# Patient Record
Sex: Female | Born: 1995 | Race: White | Hispanic: No | Smoking: Current every day smoker
Health system: Southern US, Community
[De-identification: ages and names within clinical notes are randomized; demographics above are authoritative.]

## PROBLEM LIST (undated history)

## (undated) DIAGNOSIS — N189 Chronic kidney disease, unspecified: Secondary | ICD-10-CM

## (undated) DIAGNOSIS — M1990 Unspecified osteoarthritis, unspecified site: Secondary | ICD-10-CM

## (undated) DIAGNOSIS — R519 Headache, unspecified: Secondary | ICD-10-CM

## (undated) DIAGNOSIS — F329 Major depressive disorder, single episode, unspecified: Secondary | ICD-10-CM

## (undated) DIAGNOSIS — F32A Depression, unspecified: Secondary | ICD-10-CM

## (undated) DIAGNOSIS — F1910 Other psychoactive substance abuse, uncomplicated: Secondary | ICD-10-CM

## (undated) DIAGNOSIS — G4730 Sleep apnea, unspecified: Secondary | ICD-10-CM

## (undated) DIAGNOSIS — T1491XA Suicide attempt, initial encounter: Secondary | ICD-10-CM

## (undated) DIAGNOSIS — F319 Bipolar disorder, unspecified: Secondary | ICD-10-CM

## (undated) DIAGNOSIS — K219 Gastro-esophageal reflux disease without esophagitis: Secondary | ICD-10-CM

## (undated) DIAGNOSIS — E785 Hyperlipidemia, unspecified: Secondary | ICD-10-CM

## (undated) DIAGNOSIS — F419 Anxiety disorder, unspecified: Secondary | ICD-10-CM

## (undated) DIAGNOSIS — Z87442 Personal history of urinary calculi: Secondary | ICD-10-CM

## (undated) DIAGNOSIS — D649 Anemia, unspecified: Secondary | ICD-10-CM

## (undated) DIAGNOSIS — N200 Calculus of kidney: Secondary | ICD-10-CM

## (undated) DIAGNOSIS — D493 Neoplasm of unspecified behavior of breast: Secondary | ICD-10-CM

## (undated) DIAGNOSIS — J189 Pneumonia, unspecified organism: Secondary | ICD-10-CM

## (undated) DIAGNOSIS — O020 Blighted ovum and nonhydatidiform mole: Secondary | ICD-10-CM

## (undated) DIAGNOSIS — J45909 Unspecified asthma, uncomplicated: Secondary | ICD-10-CM

## (undated) HISTORY — DX: Gastro-esophageal reflux disease without esophagitis: K21.9

## (undated) HISTORY — DX: Other psychoactive substance abuse, uncomplicated: F19.10

## (undated) HISTORY — DX: Unspecified asthma, uncomplicated: J45.909

## (undated) HISTORY — DX: Sleep apnea, unspecified: G47.30

## (undated) HISTORY — DX: Neoplasm of unspecified behavior of breast: D49.3

## (undated) HISTORY — DX: Chronic kidney disease, unspecified: N18.9

## (undated) HISTORY — DX: Major depressive disorder, single episode, unspecified: F32.9

## (undated) HISTORY — PX: TUBAL LIGATION: SHX77

## (undated) HISTORY — DX: Anxiety disorder, unspecified: F41.9

## (undated) HISTORY — PX: LITHOTRIPSY: SUR834

## (undated) HISTORY — DX: Blighted ovum and nonhydatidiform mole: O02.0

## (undated) HISTORY — DX: Suicide attempt, initial encounter: T14.91XA

## (undated) HISTORY — DX: Bipolar disorder, unspecified: F31.9

## (undated) HISTORY — PX: CYSTOSCOPY: SUR368

## (undated) HISTORY — DX: Depression, unspecified: F32.A

## (undated) HISTORY — DX: Calculus of kidney: N20.0

---
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
## 2013-06-05 ENCOUNTER — Encounter (HOSPITAL_COMMUNITY): Payer: Self-pay | Admitting: Pharmacy Technician

## 2013-06-14 ENCOUNTER — Encounter (HOSPITAL_COMMUNITY): Payer: Self-pay

## 2013-06-14 ENCOUNTER — Encounter (HOSPITAL_COMMUNITY)
Admission: RE | Admit: 2013-06-14 | Discharge: 2013-06-14 | Disposition: A | Discharge status: Home / Self Care | Payer: Medicaid Other | Source: Ambulatory Visit | Attending: Urology | Admitting: Urology

## 2013-06-20 ENCOUNTER — Ambulatory Visit (HOSPITAL_COMMUNITY)
Admission: RE | Admit: 2013-06-20 | Discharge: 2013-06-20 | Disposition: A | Discharge status: Home / Self Care | Payer: Medicaid Other | Source: Ambulatory Visit | Attending: Urology | Admitting: Urology

## 2013-06-20 ENCOUNTER — Encounter (HOSPITAL_COMMUNITY)
Admission: RE | Disposition: A | Discharge status: Home / Self Care | Payer: Self-pay | Source: Ambulatory Visit | Attending: Urology

## 2013-06-20 ENCOUNTER — Encounter (HOSPITAL_COMMUNITY): Payer: Self-pay

## 2013-06-20 DIAGNOSIS — Z888 Allergy status to other drugs, medicaments and biological substances status: Secondary | ICD-10-CM | POA: Insufficient documentation

## 2013-06-20 DIAGNOSIS — Z881 Allergy status to other antibiotic agents status: Secondary | ICD-10-CM | POA: Insufficient documentation

## 2013-06-20 DIAGNOSIS — Z880 Allergy status to penicillin: Secondary | ICD-10-CM | POA: Insufficient documentation

## 2013-06-20 DIAGNOSIS — N201 Calculus of ureter: Secondary | ICD-10-CM

## 2013-06-20 HISTORY — PX: EXTRACORPOREAL SHOCK WAVE LITHOTRIPSY: SHX1557

## 2013-06-20 HISTORY — DX: Anemia, unspecified: D64.9

## 2013-06-20 SURGERY — LITHOTRIPSY, ESWL
Anesthesia: Moderate Sedation | Laterality: Right

## 2013-06-20 MED ORDER — CIPROFLOXACIN HCL 250 MG PO TABS
ORAL_TABLET | ORAL | Status: AC
  Filled 2013-06-20: qty 2

## 2013-06-20 MED ORDER — CIPROFLOXACIN HCL 250 MG PO TABS
500.0000 mg | ORAL_TABLET | Freq: Two times a day (BID) | ORAL | Status: DC
  Administered 2013-06-20: 500 mg via ORAL

## 2013-06-20 MED ORDER — OXYCODONE-ACETAMINOPHEN 5-325 MG PO TABS: 1.0000 | ORAL_TABLET | ORAL | Status: DC | PRN

## 2013-06-20 MED ORDER — TAMSULOSIN HCL 0.4 MG PO CAPS: 0.4000 mg | ORAL_CAPSULE | Freq: Every day | ORAL | Status: DC

## 2013-06-20 MED ORDER — DIAZEPAM 5 MG PO TABS
ORAL_TABLET | ORAL | Status: AC
  Filled 2013-06-20: qty 1

## 2013-06-20 MED ORDER — LACTATED RINGERS IV SOLN
INTRAVENOUS | Status: DC
  Administered 2013-06-20: 08:00:00 via INTRAVENOUS

## 2013-06-20 MED ORDER — ONDANSETRON HCL 4 MG/2ML IJ SOLN: 4.0000 mg | Freq: Once | INTRAMUSCULAR | Status: DC

## 2013-06-20 MED ORDER — DIAZEPAM 5 MG PO TABS
ORAL_TABLET | ORAL | Status: AC
Start: 2013-06-20 — End: 2013-06-20
  Filled 2013-06-20: qty 1

## 2013-06-20 MED ORDER — DIPHENHYDRAMINE HCL 25 MG PO CAPS
ORAL_CAPSULE | ORAL | Status: AC
  Filled 2013-06-20: qty 1

## 2013-06-20 MED ORDER — DIPHENHYDRAMINE HCL 25 MG PO CAPS
25.0000 mg | ORAL_CAPSULE | Freq: Once | ORAL | Status: AC
  Administered 2013-06-20: 25 mg via ORAL
  Filled 2013-06-20: qty 1

## 2013-06-20 MED ORDER — DIAZEPAM 5 MG PO TABS
10.0000 mg | ORAL_TABLET | Freq: Once | ORAL | Status: AC
  Administered 2013-06-20: 10 mg via ORAL

## 2013-06-20 NOTE — Discharge Instructions (Signed)

## 2013-06-22 ENCOUNTER — Encounter (HOSPITAL_COMMUNITY): Payer: Self-pay | Admitting: Urology

## 2015-02-09 HISTORY — PX: TUBAL LIGATION: SHX77

## 2016-03-10 IMAGING — US US ABDOMEN LIMITED
1 series · 14 of 25 positions shown · non-contrast
Comparison: None.

CLINICAL DATA: Right upper quadrant abdomen pain.

EXAM:
ULTRASOUND ABDOMEN LIMITED RIGHT UPPER QUADRANT

[Series 1: us abdomen limited · 0.18mm/px · 14 of 44 slices shown]
[im 1/44]
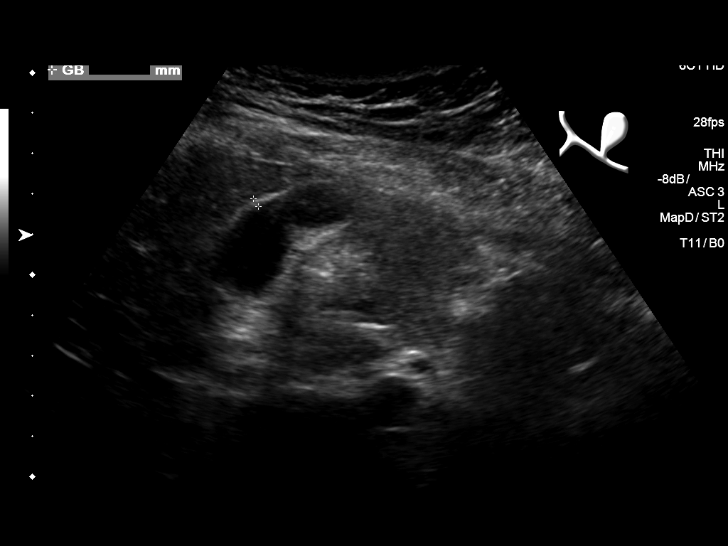
[im 4/44]
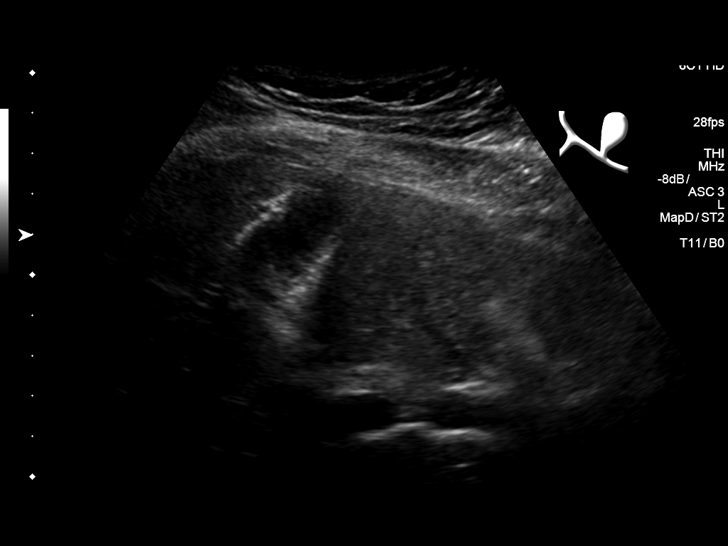
[im 8/44]
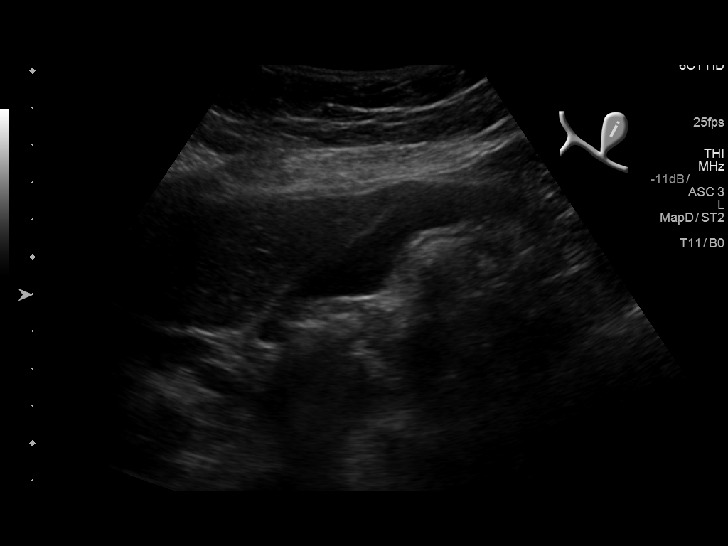
[im 11/44]
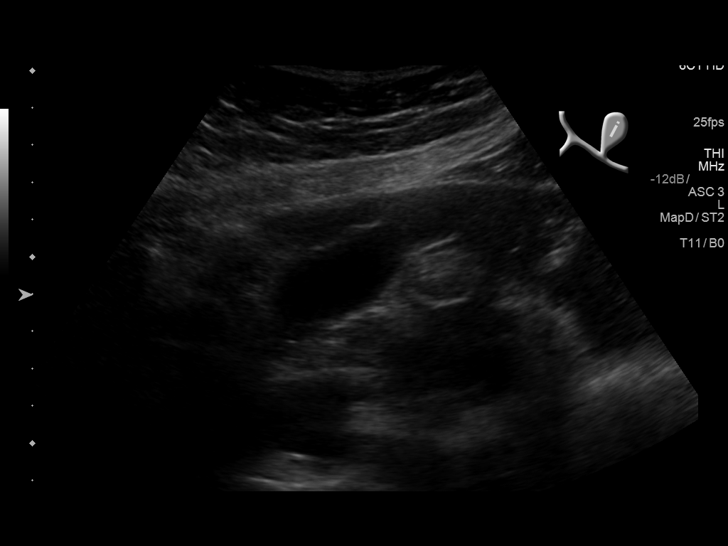
[im 15/44]
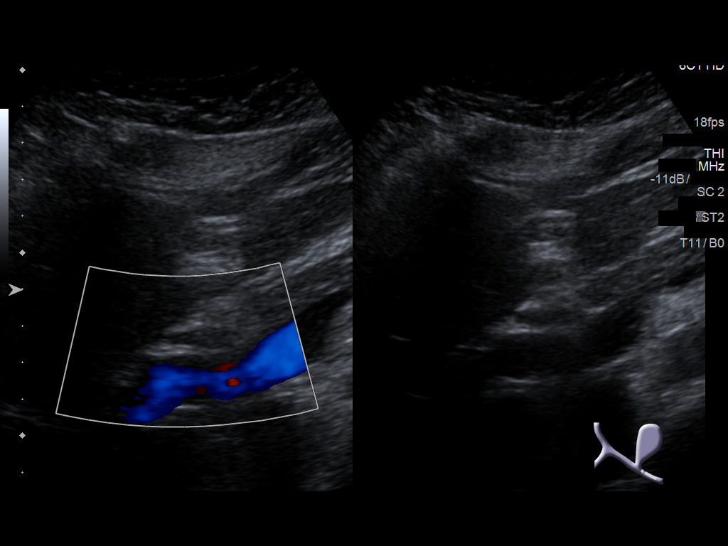
[im 17/44]
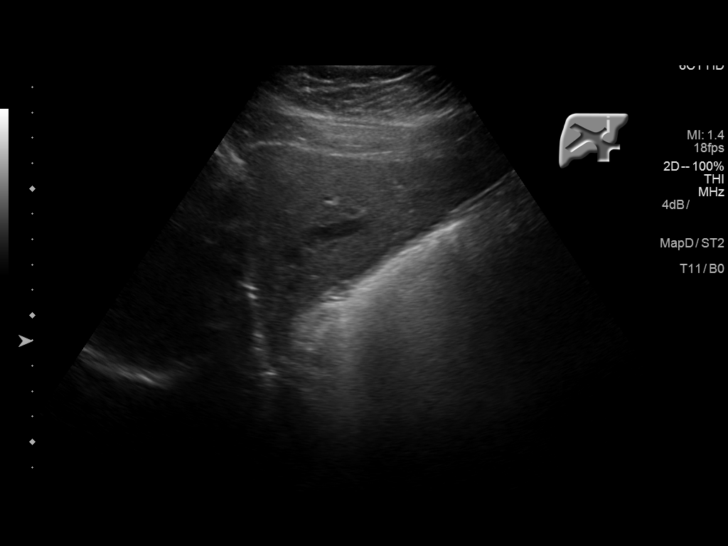
[im 20/44]
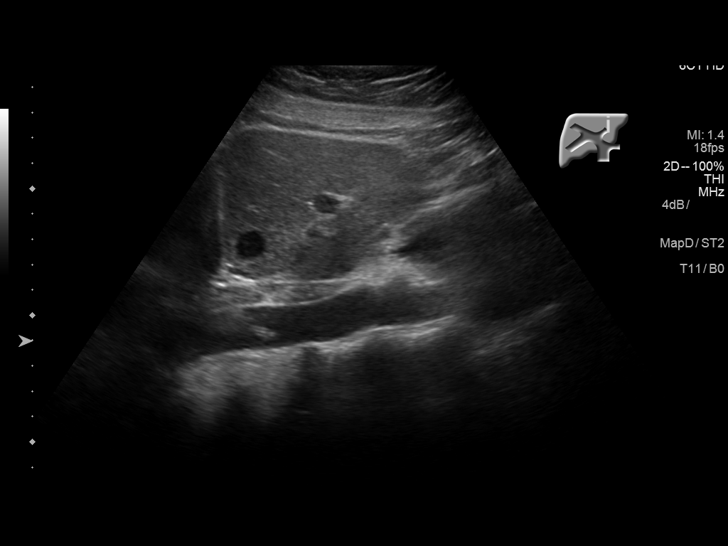
[im 24/44]
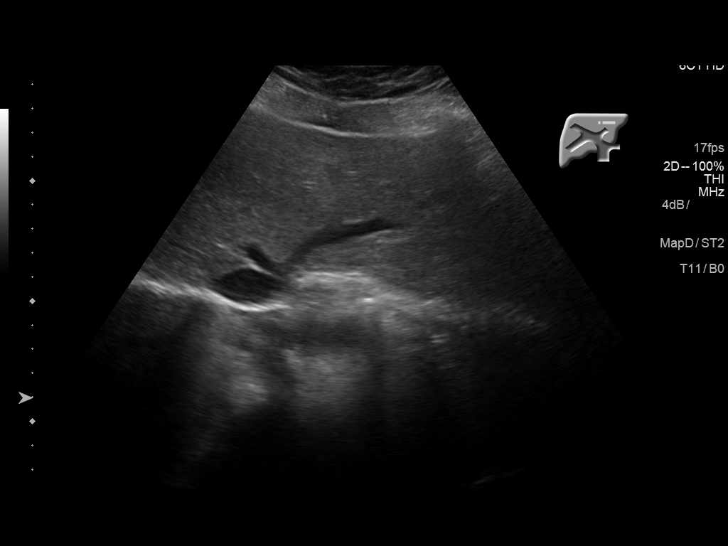
[im 27/44]
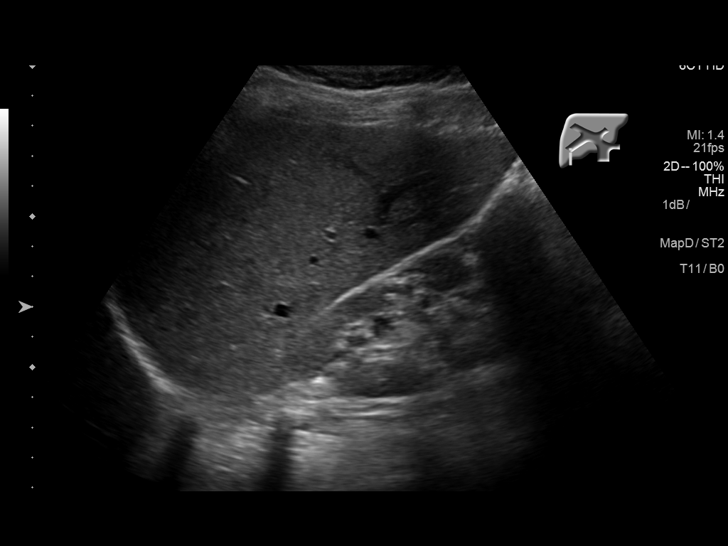
[im 29/44]
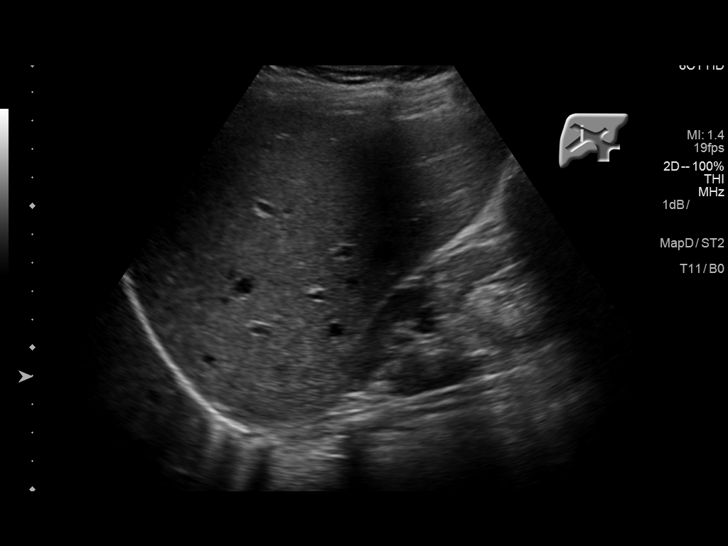
[im 33/44]
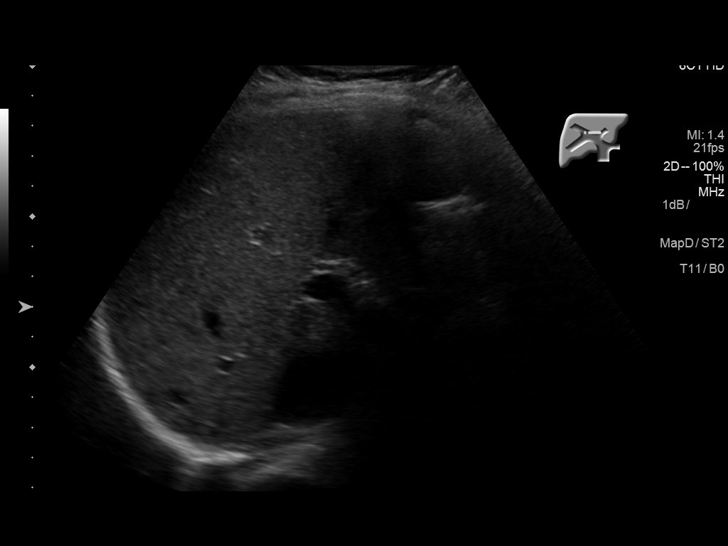
[im 36/44]
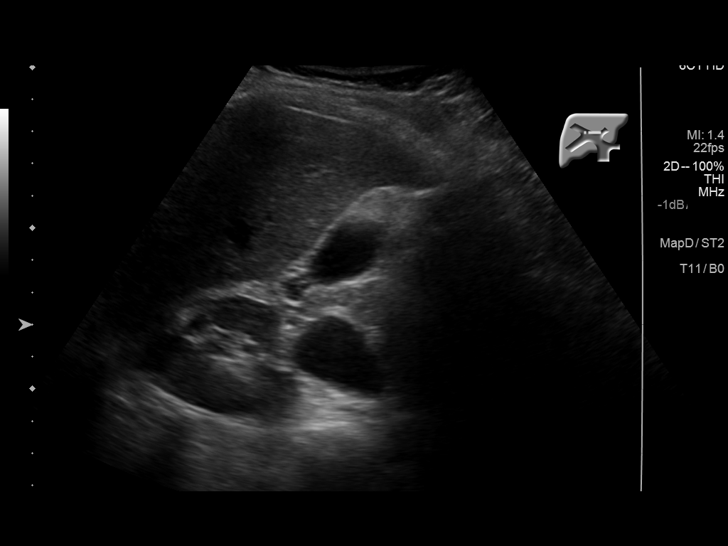
[im 40/44]
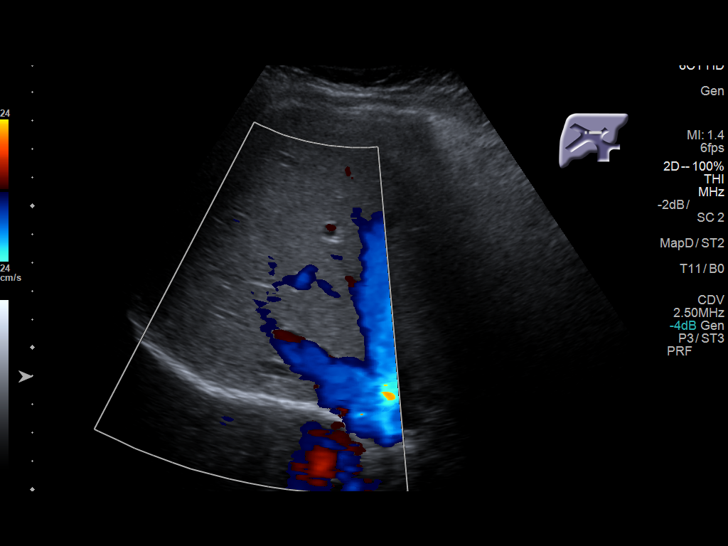
[im 44/44]
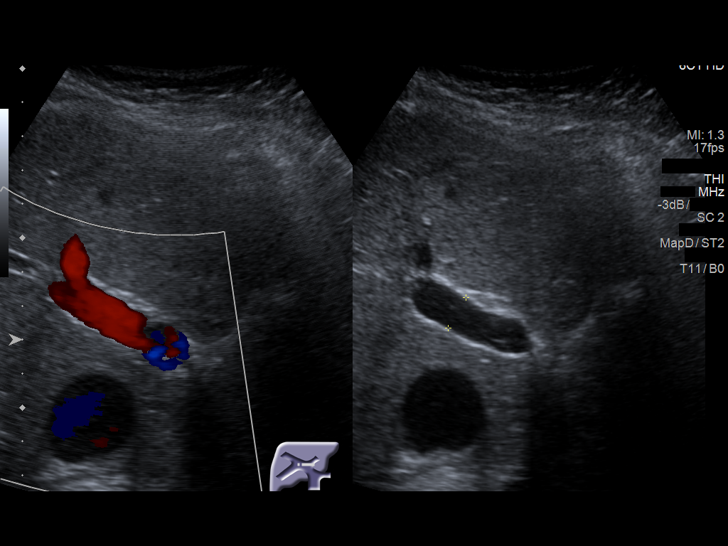

[14 of 25 positions shown; findings below may reference images not displayed]

FINDINGS: Gallbladder:

No gallstones or wall thickening visualized. No sonographic Murphy
sign noted by sonographer.

Common bile duct:

Diameter: 5 mm

Liver:

No focal lesion identified. Within normal limits in parenchymal
echogenicity. Portal vein is patent on color Doppler imaging with
normal direction of blood flow towards the liver.
IMPRESSION: Normal right upper quadrant ultrasound.  Gallbladder is normal.

## 2017-03-01 ENCOUNTER — Institutional Professional Consult (permissible substitution): Payer: Medicaid Other | Admitting: Cardiothoracic Surgery

## 2017-03-01 ENCOUNTER — Encounter: Payer: Self-pay | Admitting: Cardiothoracic Surgery

## 2017-03-01 VITALS — BP 115/70 | HR 86 | Resp 18 | Ht 61.0 in | Wt 150.0 lb

## 2017-03-01 DIAGNOSIS — R0789 Other chest pain: Secondary | ICD-10-CM

## 2017-03-01 DIAGNOSIS — J9859 Other diseases of mediastinum, not elsewhere classified: Secondary | ICD-10-CM

## 2017-03-01 NOTE — Progress Notes (Signed)
PCP is No primary care provider on file. Referring Provider is Lanelle Bal, PA-C  Chief Complaint  Patient presents with  . 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 Cardiac enzymes are negative. CTA showed no evidence of pulmonary emboli however there was a anterior superior mediastinal amorphous density measuring 1.5 x 4 cm extending beneath the sternum to the left prevascular space. There was concern this could represent a thymoma. My impression is that this could either represent mediastinal fat as the patient has gained significant weight over the past 6 months, it could represent a normal thymus which has not yet involuted; it does not appear organized or discrete enough to be a thymoma.  Patient has had atypical chest pain over the past few weeks underneath her sternum and radiating under each breast. Worse with deep breath, laying down, or standing. No improvement with bending over. No significant associated cough fever productive cough or difficulty swallowing.  The patient is on several psychotropic medications including trazodone Klonopin and Celexa. Past Medical History:  Diagnosis Date  . Anemia     Past Surgical History:  Procedure Laterality Date  . CYSTOSCOPY     x2 with stone extraction  . EXTRACORPOREAL SHOCK WAVE LITHOTRIPSY Right 06/20/2013   Procedure: EXTRACORPOREAL SHOCK WAVE LITHOTRIPSY (ESWL);  Surgeon: Edwin Dada, MD;  Location: [facility];  Service: Urology;  Laterality: Right;  . LITHOTRIPSY      History reviewed. No pertinent family history.  Social History Social History   Tobacco Use  . Smoking status: Never Smoker  Substance Use Topics  . Alcohol  use: No  . Drug use: No    Current Outpatient Medications  Medication Sig Dispense Refill  . clonazePAM (KLONOPIN) 0.5 MG tablet TAKE ONE TABLET BY MOUTH EVERY DAY AS NEEDED FOR ANXIETY  2  . ibuprofen (ADVIL,MOTRIN) 800 MG tablet Take 800 mg by mouth every 8 (eight) hours as needed for moderate pain.    . traZODone (DESYREL) 50 MG tablet Take 50 mg by mouth at bedtime.  0  . citalopram (CELEXA) 20 MG tablet Take 20 mg by mouth daily.  2  . ferrous fumarate (HEMOCYTE - 106 MG FE) 325 (106 FE) MG TABS tablet Take 1 tablet by mouth.    . oxyCODONE-acetaminophen (ROXICET) 5-325 MG per tablet Take 1 tablet by mouth every 4 (four) hours as needed for severe pain. (Patient not taking: Reported on 03/01/2017) 30 tablet 0  . tamsulosin (FLOMAX) 0.4 MG CAPS capsule Take 1 capsule (0.4 mg total) by mouth daily after supper. (Patient not taking: Reported on 03/01/2017) 30 capsule 0   No current facility-administered medications for this visit.     Allergies  Allergen Reactions  . Penicillins Hives  . Phenergan [Promethazine Hcl]   . Zofran [Ondansetron Hcl] Other (See Comments)    "makes nerves jerk"    Review of Systems  She is gained 20 pounds in the past 3-6 months She denies any chest trauma She denies difficulty swallowing She is concerned over mold growing on the floor in her home She has had no major surgery other than tubal ligation She has had some swelling in her right buttock area which appears to be fat and states  she had an ultrasound which showed that there was no mass or DVT  BP 115/70   Pulse 86   Resp 18   Ht 5\' 1"  (1.549 m)   Wt 150 lb (68 kg)   SpO2 99% Comment: RA  BMI 28.34 kg/m  Physical Exam      Exam    General- alert and comfortable    Neck- no JVD, no cervical adenopathy palpable, no carotid bruit   Lungs- clear without rales, wheezes   Cor- regular rate and rhythm, no murmur , gallop   Abdomen- soft, , obese, nontender   Extremities - warm, non-tender,  minimal edema   Neuro- oriented, appropriate, no focal weakness Good peripheral pulses  Diagnostic Tests: CT scan shows the amorphous 1 x 4 cm anterior superior mediastinal soft tissue density which probably represents fat or normal thymic gland.  Impression: Low risk for thymoma. Patient reassured. We'll need follow-up CT scan in 6 months Atypical chest pain with some similarities pericarditis so we'll proceed with echocardiogram. Plan: Return after CT scan of chest with IV contrast in 6 months. I will review the echocardiogram when that is performed.   Len Childs, MD [facility] [phone]

## 2017-03-02 ENCOUNTER — Other Ambulatory Visit: Payer: Self-pay | Admitting: *Deleted

## 2017-03-02 DIAGNOSIS — R0789 Other chest pain: Secondary | ICD-10-CM

## 2017-03-02 DIAGNOSIS — I319 Disease of pericardium, unspecified: Secondary | ICD-10-CM

## 2017-03-04 ENCOUNTER — Ambulatory Visit (HOSPITAL_COMMUNITY)
Admission: RE | Admit: 2017-03-04 | Discharge: 2017-03-04 | Disposition: A | Discharge status: Home / Self Care | Payer: Medicaid Other | Source: Ambulatory Visit | Attending: Cardiothoracic Surgery | Admitting: Cardiothoracic Surgery

## 2017-03-04 DIAGNOSIS — I319 Disease of pericardium, unspecified: Secondary | ICD-10-CM | POA: Diagnosis present

## 2017-03-04 DIAGNOSIS — I371 Nonrheumatic pulmonary valve insufficiency: Secondary | ICD-10-CM | POA: Diagnosis not present

## 2017-03-04 DIAGNOSIS — I071 Rheumatic tricuspid insufficiency: Secondary | ICD-10-CM | POA: Diagnosis not present

## 2017-03-04 DIAGNOSIS — R0789 Other chest pain: Secondary | ICD-10-CM | POA: Insufficient documentation

## 2017-03-04 LAB — ECHOCARDIOGRAM COMPLETE
AO mean calculated velocity dopler: 74.9 cm/s
AV Area VTI index: 1.23 cm2/m2
AV Area VTI: 1.92 cm2
AV Area mean vel: 2.15 cm2
AV Mean grad: 3 mmHg
AV Peak grad: 6 mmHg
AV VEL mean LVOT/AV: 0.68
AV area mean vel ind: 1.24 cm2/m2
AV peak Index: 1.11
AV pk vel: 118 cm/s
AV vel: 2.14
Ao pk vel: 0.61 m/s
E decel time: 236 msec
E/e' ratio: 4.79
FS: 32 % (ref 28–44)
IVS/LV PW RATIO, ED: 1.12
LA ID, A-P, ES: 37 mm
LA diam end sys: 37 mm
LA diam index: 2.14 cm/m2
LA vol A4C: 38.1 ml
LA vol index: 25.7 mL/m2
LA vol: 44.6 mL
LV E/e' medial: 4.79
LV E/e'average: 4.79
LV PW d: 7.34 mm — AB (ref 0.6–1.1)
LV dias vol index: 53 mL/m2
LV dias vol: 92 mL (ref 46–106)
LV e' LATERAL: 17.6 cm/s
LV sys vol index: 18 mL/m2
LV sys vol: 30 mL
LVOT SV: 55 mL
LVOT VTI: 17.4 cm
LVOT area: 3.14 cm2
LVOT diameter: 20 mm
LVOT peak VTI: 0.68 cm
LVOT peak grad rest: 2 mmHg
LVOT peak vel: 72.1 cm/s
Lateral S' vel: 14.3 cm/s
MV Dec: 236
MV pk A vel: 39.8 m/s
MV pk E vel: 84.3 m/s
Peak grad: 3 mmHg
RV sys press: 21 mmHg
Reg peak vel: 182 cm/s
Simpson's disk: 67
Stroke v: 62 ml
TAPSE: 20.8 mm
TDI e' lateral: 17.6
TDI e' medial: 11.2
TR max vel: 182 cm/s
VTI: 25.5 cm
Valve area index: 1.23
Valve area: 2.14 cm2

## 2017-03-04 NOTE — Progress Notes (Signed)
*  PRELIMINARY RESULTS* Echocardiogram 2D Echocardiogram has been performed.  Samuel Germany 03/04/2017, 11:17 AM

## 2017-04-21 ENCOUNTER — Encounter: Payer: Self-pay | Admitting: Family Medicine

## 2017-04-26 ENCOUNTER — Other Ambulatory Visit: Payer: Self-pay

## 2017-04-26 ENCOUNTER — Encounter: Payer: Self-pay | Admitting: Family Medicine

## 2017-04-26 ENCOUNTER — Emergency Department (HOSPITAL_COMMUNITY): Payer: Medicaid Other

## 2017-04-26 ENCOUNTER — Emergency Department (HOSPITAL_COMMUNITY)
Admission: EM | Admit: 2017-04-26 | Discharge: 2017-04-26 | Disposition: A | Discharge status: Home / Self Care | Payer: Medicaid Other | Attending: Emergency Medicine | Admitting: Emergency Medicine

## 2017-04-26 ENCOUNTER — Encounter (HOSPITAL_COMMUNITY): Payer: Self-pay | Admitting: Emergency Medicine

## 2017-04-26 DIAGNOSIS — R1011 Right upper quadrant pain: Secondary | ICD-10-CM

## 2017-04-26 DIAGNOSIS — R319 Hematuria, unspecified: Secondary | ICD-10-CM | POA: Insufficient documentation

## 2017-04-26 DIAGNOSIS — Z79899 Other long term (current) drug therapy: Secondary | ICD-10-CM | POA: Diagnosis not present

## 2017-04-26 DIAGNOSIS — R112 Nausea with vomiting, unspecified: Secondary | ICD-10-CM | POA: Diagnosis not present

## 2017-04-26 DIAGNOSIS — K2900 Acute gastritis without bleeding: Secondary | ICD-10-CM | POA: Diagnosis not present

## 2017-04-26 LAB — URINALYSIS, ROUTINE W REFLEX MICROSCOPIC
BACTERIA UA: NONE SEEN
Bilirubin Urine: NEGATIVE
Glucose, UA: NEGATIVE mg/dL
KETONES UR: NEGATIVE mg/dL
Nitrite: NEGATIVE
PROTEIN: NEGATIVE mg/dL
Specific Gravity, Urine: 1.011 (ref 1.005–1.030)
pH: 7 (ref 5.0–8.0)

## 2017-04-26 LAB — CBC
HEMATOCRIT: 39.1 % (ref 36.0–46.0)
Hemoglobin: 12.4 g/dL (ref 12.0–15.0)
MCH: 29.5 pg (ref 26.0–34.0)
MCHC: 31.7 g/dL (ref 30.0–36.0)
MCV: 92.9 fL (ref 78.0–100.0)
Platelets: 188 10*3/uL (ref 150–400)
RBC: 4.21 MIL/uL (ref 3.87–5.11)
RDW: 13.2 % (ref 11.5–15.5)
WBC: 3.8 10*3/uL — ABNORMAL LOW (ref 4.0–10.5)

## 2017-04-26 LAB — COMPREHENSIVE METABOLIC PANEL
ALT: 23 U/L (ref 14–54)
AST: 25 U/L (ref 15–41)
Albumin: 3.9 g/dL (ref 3.5–5.0)
Alkaline Phosphatase: 46 U/L (ref 38–126)
Anion gap: 9 (ref 5–15)
BUN: 10 mg/dL (ref 6–20)
CHLORIDE: 105 mmol/L (ref 101–111)
CO2: 27 mmol/L (ref 22–32)
Calcium: 9 mg/dL (ref 8.9–10.3)
Creatinine, Ser: 0.71 mg/dL (ref 0.44–1.00)
GFR calc non Af Amer: >60 mL/min (ref >60)
Glucose, Bld: 74 mg/dL (ref 65–99)
POTASSIUM: 3.9 mmol/L (ref 3.5–5.1)
Sodium: 141 mmol/L (ref 135–145)
Total Bilirubin: 0.6 mg/dL (ref 0.3–1.2)
Total Protein: 7.1 g/dL (ref 6.5–8.1)

## 2017-04-26 LAB — WET PREP, GENITAL
CLUE CELLS WET PREP: NONE SEEN
Sperm: NONE SEEN
Trich, Wet Prep: NONE SEEN
Yeast Wet Prep HPF POC: NONE SEEN

## 2017-04-26 LAB — LIPASE, BLOOD: LIPASE: 45 U/L (ref 11–51)

## 2017-04-26 LAB — HCG, QUANTITATIVE, PREGNANCY

## 2017-04-26 MED ORDER — METOCLOPRAMIDE HCL 10 MG PO TABS
10.0000 mg | ORAL_TABLET | Freq: Once | ORAL | Status: AC
  Administered 2017-04-26: 10 mg via ORAL
  Filled 2017-04-26: qty 1

## 2017-04-26 MED ORDER — GI COCKTAIL ~~LOC~~
30.0000 mL | Freq: Once | ORAL | Status: AC
  Administered 2017-04-26: 30 mL via ORAL
  Filled 2017-04-26: qty 30

## 2017-04-26 NOTE — ED Provider Notes (Signed)
CONTINUING CARE FROM [facility]  Patient is a 22 year old female who presents to the emergency department with a complaint of abdominal pain. Patient CARE being continued.  Vital signs reviewed.  Labs reviewed.  No acute findings noted.  Medical records were faxed from [facility].  I have reviewed these records.  The vital signs were stable, the labs were nonacute, the CT scan was negative for acute problem.  And the ultrasound was negative.  I obtain the records of the HIDA scan.  The HIDA scan with the CCK is negative for acute problem.  Ultrasound here at the emergency department is also negative for acute problem.  I reviewed the findings of the tests done at [facility], the HIDA scan today, and the ultrasound today   With the patient in terms which she understands.  I strongly encouraged the patient to see Dr. Buford Dresser for GI evaluation.  I feel that the pain is probably related to acute gastritis especially since patient is H. pylori positive.  The patient is already been started on medications for H. pylori.  The patient is to see Dr. Buford Dresser as soon as possible.  The patient can return to the emergency department for additional evaluation and management if any emergent changes, changes in condition, problems, or concerns before being seen by Dr. Buford Dresser.   Lily Kocher, PA-C 04/26/17 1820    Noemi Chapel, MD 04/27/17 [phone]

## 2017-04-26 NOTE — Discharge Instructions (Signed)
We have reviewed your annual visit from the [facility] emergency department.  We reviewed your HIDA scan that she had done earlier today which is completely normal.  We repeated her ultrasound here in the emergency department today and it too was normal.  I suspect that the abdominal pain is related to gastritis, or irritation of the lining of your stomach, and/or intestine.  Your H. pylori test came back positive.  You have been placed on medications including antibiotics for this.  It is extremely important that you see Dr. Buford Dresser, or the GI specialist of your choice to complete your workup and to continue your treatment.

## 2017-04-26 NOTE — ED Provider Notes (Signed)
[facility] EMERGENCY DEPARTMENT Provider Note   CSN: [id number] Arrival date & time: 04/26/17  1331     History   Chief Complaint Chief Complaint  Patient presents with  . Abdominal Pain    HPI Kellie Martinez is a 22 y.o. female who presents with abdominal pain. PMH significant for recurrent kidney stones, congenital "sponge" kidney. She states that her abdominal pain started Wednesday night. She went to [facility] on Thursday and had blood work, CT of abdomen/pelvis, RUQ [place], and a UA. She was diagnosed with H. Pylori gastritis and discharged with Omeprazole, Flagyl, Biaxin, Norco, Zofran. She states she has continued to have abdominal pain every time she eats with recurrent N/V. She cannot recall the results of her CT or [place]. She went back to the hospital today and had an HIDA with CCK done. She cannot recall the results of this either. She went home and ate something and started to have severe abdominal pain and N/V again. She came to [facility] because "they were doing anything for me". She denies fever, chills, diarrhea, constipation, urinary symptoms, vaginal discharge. Past surgical hx significant for tubal ligation.  HPI  Past Medical History:  Diagnosis Date  . Anemia     There are no active problems to display for this patient.   Past Surgical History:  Procedure Laterality Date  . CYSTOSCOPY     x2 with stone extraction  . EXTRACORPOREAL SHOCK WAVE LITHOTRIPSY Right 06/20/2013   Procedure: EXTRACORPOREAL SHOCK WAVE LITHOTRIPSY (ESWL);  Surgeon: Edwin Dada, MD;  Location: [facility];  Service: Urology;  Laterality: Right;  . LITHOTRIPSY      OB History    No data available       Home Medications    Prior to Admission medications   Medication Sig Start Date End Date Taking? Authorizing Provider  citalopram (CELEXA) 20 MG tablet Take 20 mg by mouth daily. 12/22/16   [provider]  clonazePAM (KLONOPIN) 0.5 MG tablet TAKE ONE TABLET BY MOUTH EVERY  DAY AS NEEDED FOR ANXIETY 02/23/17   [provider]  ferrous fumarate (HEMOCYTE - 106 MG FE) 325 (106 FE) MG TABS tablet Take 1 tablet by mouth.    [provider]  ibuprofen (ADVIL,MOTRIN) 800 MG tablet Take 800 mg by mouth every 8 (eight) hours as needed for moderate pain.    [provider]  oxyCODONE-acetaminophen (ROXICET) 5-325 MG per tablet Take 1 tablet by mouth every 4 (four) hours as needed for severe pain. Patient not taking: Reported on 03/01/2017 06/20/13   Edwin Dada, MD  tamsulosin (FLOMAX) 0.4 MG CAPS capsule Take 1 capsule (0.4 mg total) by mouth daily after supper. Patient not taking: Reported on 03/01/2017 06/20/13   Edwin Dada, MD  traZODone (DESYREL) 50 MG tablet Take 50 mg by mouth at bedtime. 02/23/17   [provider]    Family History History reviewed. No pertinent family history.  Social History Social History   Tobacco Use  . Smoking status: Never Smoker  . Smokeless tobacco: Never Used  Substance Use Topics  . Alcohol use: No  . Drug use: No     Allergies   Penicillins; Phenergan [promethazine hcl]; and Zofran [ondansetron hcl]   Review of Systems Review of Systems  Constitutional: Negative for appetite change and fever.  Respiratory: Negative for shortness of breath.   Cardiovascular: Positive for chest pain.  Gastrointestinal: Positive for abdominal pain, constipation and nausea. Negative for diarrhea and vomiting.  Genitourinary: Positive  for hematuria. Negative for dysuria and vaginal discharge.  All other systems reviewed and are negative.    Physical Exam Updated Vital Signs BP 129/81 (BP Location: Right Arm)   Pulse 67   Temp 98.8 F (37.1 C) (Oral)   Resp 16   Ht 5\' 1"  (1.549 m)   Wt 73.9 kg (163 lb)   LMP 05/09/2013   SpO2 100%   BMI 30.80 kg/m   Physical Exam  Constitutional: She is oriented to person, place, and time. She appears well-developed and well-nourished. No distress.    HENT:  Head: Normocephalic and atraumatic.  Eyes: Conjunctivae are normal. Pupils are equal, round, and reactive to light. Right eye exhibits no discharge. Left eye exhibits no discharge. No scleral icterus.  Neck: Normal range of motion.  Cardiovascular: Normal rate and regular rhythm.  Pulmonary/Chest: Effort normal and breath sounds normal. No respiratory distress.  Abdominal: Soft. Bowel sounds are normal. She exhibits no distension. There is tenderness (Diffuse tenderness).  Genitourinary:  Genitourinary Comments: Pelvic: No inguinal lymphadenopathy or inguinal hernia noted. Normal external genitalia. No pain with speculum insertion. Closed cervical os with normal appearance - no rash or lesions. No significant discharge or bleeding noted from cervix or in vaginal vault. On bimanual examination no adnexal tenderness or cervical motion tenderness. Chaperone present during exam.    Neurological: She is alert and oriented to person, place, and time.  Skin: Skin is warm and dry.  Psychiatric: She has a normal mood and affect. Her behavior is normal.  Nursing note and vitals reviewed.    ED Treatments / Results  Labs (all labs ordered are listed, but only abnormal results are displayed) Labs Reviewed  WET PREP, GENITAL - Abnormal; Notable for the following components:      Result Value   WBC, Wet Prep HPF POC MANY (*)    All other components within normal limits  CBC - Abnormal; Notable for the following components:   WBC 3.8 (*)    All other components within normal limits  URINALYSIS, ROUTINE W REFLEX MICROSCOPIC - Abnormal; Notable for the following components:   APPearance HAZY (*)    Hgb urine dipstick MODERATE (*)    Leukocytes, UA TRACE (*)    Squamous Epithelial / LPF 6-30 (*)    All other components within normal limits  LIPASE, BLOOD  COMPREHENSIVE METABOLIC PANEL  HCG, QUANTITATIVE, PREGNANCY  GC/CHLAMYDIA PROBE AMP ([place]) NOT AT [facility]    EKG  EKG  Interpretation None       Radiology No results found.  Procedures Procedures (including critical care time)  Medications Ordered in ED Medications - No data to display   Initial Impression / Assessment and Plan / ED Course  I have reviewed the triage vital signs and the nursing notes.  Pertinent labs & imaging results that were available during my care of the patient were reviewed by me and considered in my medical decision making (see chart for details).  22 year old female presents with abdominal pain, N/V. Vitals are normal. Abdomen is soft and she has generalized tenderness. Pelvic is unremarkable. Labs are normal. UA shows blood in the urine. Wet prep shows many WBC. G&C were sent. Records from [facility] were requested to avoid repeat imaging which is pending at shift change. She was given a GI cocktail and reglan. RUQ [place] ordered. Care transferred to [facility] who will dispo accordingly.  Final Clinical Impressions(s) / ED Diagnoses   Final diagnoses:  RUQ pain  Non-intractable vomiting with nausea, unspecified vomiting type    ED Discharge Orders    None       Recardo Evangelist, PA-C 04/26/17 1704    Fredia Sorrow, MD 04/27/17 [phone]

## 2017-04-26 NOTE — ED Notes (Signed)
Family at bedside. 

## 2017-04-26 NOTE — ED Notes (Signed)
Medical records requested from [facility] pt having a pelvic exam at this time.

## 2017-04-26 NOTE — ED Notes (Signed)
ED Provider at bedside. 

## 2017-04-26 NOTE — ED Notes (Signed)
Patient is resting comfortably. 

## 2017-04-26 NOTE — ED Triage Notes (Signed)
Pt was seen at [facility] last week and today.  [place] of gallbladder, CT scan, blood work and ERCP.    Pt comes today for increasing abdominal pain and n/v with eating food.

## 2017-04-27 LAB — GC/CHLAMYDIA PROBE AMP (~~LOC~~) NOT AT ARMC
Chlamydia: NEGATIVE
NEISSERIA GONORRHEA: NEGATIVE

## 2017-05-06 ENCOUNTER — Ambulatory Visit (INDEPENDENT_AMBULATORY_CARE_PROVIDER_SITE_OTHER): Payer: Medicaid Other | Admitting: Family Medicine

## 2017-05-06 ENCOUNTER — Encounter: Payer: Self-pay | Admitting: Family Medicine

## 2017-05-06 VITALS — BP 101/74 | HR 87 | Temp 97.6°F | Ht 61.0 in | Wt 162.0 lb

## 2017-05-06 DIAGNOSIS — Z7689 Persons encountering health services in other specified circumstances: Secondary | ICD-10-CM | POA: Diagnosis not present

## 2017-05-06 DIAGNOSIS — Q618 Other cystic kidney diseases: Secondary | ICD-10-CM | POA: Diagnosis not present

## 2017-05-06 DIAGNOSIS — G479 Sleep disorder, unspecified: Secondary | ICD-10-CM | POA: Diagnosis not present

## 2017-05-06 DIAGNOSIS — Q619 Cystic kidney disease, unspecified: Secondary | ICD-10-CM | POA: Insufficient documentation

## 2017-05-06 DIAGNOSIS — K219 Gastro-esophageal reflux disease without esophagitis: Secondary | ICD-10-CM | POA: Insufficient documentation

## 2017-05-06 DIAGNOSIS — Z9189 Other specified personal risk factors, not elsewhere classified: Secondary | ICD-10-CM

## 2017-05-06 DIAGNOSIS — N3001 Acute cystitis with hematuria: Secondary | ICD-10-CM

## 2017-05-06 DIAGNOSIS — Q615 Medullary cystic kidney: Secondary | ICD-10-CM

## 2017-05-06 LAB — MICROSCOPIC EXAMINATION
Epithelial Cells (non renal): >10 /hpf — AB (ref 0–10)
Renal Epithel, UA: NONE SEEN /hpf

## 2017-05-06 LAB — URINALYSIS, COMPLETE
BILIRUBIN UA: NEGATIVE
GLUCOSE, UA: NEGATIVE
Ketones, UA: NEGATIVE
Nitrite, UA: NEGATIVE
PROTEIN UA: NEGATIVE
SPEC GRAV UA: 1.02 (ref 1.005–1.030)
Urobilinogen, Ur: 0.2 mg/dL (ref 0.2–1.0)
pH, UA: 6.5 (ref 5.0–7.5)

## 2017-05-06 MED ORDER — HYDROXYZINE HCL 10 MG PO TABS: 10.0000 mg | ORAL_TABLET | Freq: Every day | ORAL | 0 refills | Status: DC

## 2017-05-06 MED ORDER — CEPHALEXIN 500 MG PO CAPS: 500.0000 mg | ORAL_CAPSULE | Freq: Two times a day (BID) | ORAL | 0 refills | Status: AC

## 2017-05-06 NOTE — Patient Instructions (Signed)

## 2017-05-06 NOTE — Progress Notes (Signed)
Subjective: DX:IPJASNKNL care, dysuria HPI: Kellie Martinez is a 22 y.o. female presenting to clinic today for:  1.  Dysuria/kidney disease/mediastinal mass Patient notes that she has had a several week history of dysuria, urinary urgency and frequency.  She thinks she may be playing less volume though.  She thinks that perhaps she has had some blood in her urine.  She has not been tested or treated for urinary tract infection.  She notes that she was actually seen in the emergency department recently for several issues, including abdominal pain.  She notes that she had a CT scan of her abdomen and was noted to have "sponge kidney" and a "mass on her heart".  She is seen in the cardiothoracic surgeon and had a echocardiogram performed recently.  Echo was within normal limits.  He has plans to repeat her CT chest with contrast in July 2019 for this mediastinal mass.  With regards to her kidneys, she notes that she called the kidney specialist in [place] to set up an appointment and was told that she would be contacted with an appointment but she never heard back.  She would like a new referral for this.  2.  H. pylori infection with GERD She notes that she is almost completed the antibiotic course for H. pylori infection.  She notes that abdominal pain has substantially improved with the antibiotics.  She still has some PPI left.  When she is completed the course, she will contact me if she has continued acid reflux symptoms.  She does not wish to see a GI doctor anymore since symptoms have almost totally resolved.  Denies hematochezia, melena, nausea, vomiting, fevers, abdominal pain.  3.  Difficulty sleeping/history of accidental overdose/ hx GAD/Depression Patient notes that she has had a long history of difficulty with sleeping.  She was previously treated with trazodone.  She notes that she has a history of accidental overdose last year.  [facility] and [facility] off all of her  medications.  She notes that she continues to only get a couple of hours of sleep per night and does not feel well rested in the morning.  She has been taking 5 mg of melatonin daily with little improvement in symptoms.  She denies alcohol use and notes that she rarely ever drinks.  She used to use marijuana but no longer does.  No histories of hospitalizations for mental health disorder.  She does have a history of anxiety and depression but is not currently on medications and does not feel like she needs to be.  She notes that her symptoms actually have been well controlled off of medications.  She is not currently seeing psychiatry because she does not feel that she needs to at this time.  Past Medical History:  Diagnosis Date  . Anemia   . Anxiety   . Asthma   . Chronic kidney disease   . GERD (gastroesophageal reflux disease)   . Sleep apnea   . Substance abuse [facility])    Past Surgical History:  Procedure Laterality Date  . CYSTOSCOPY     x2 with stone extraction  . EXTRACORPOREAL SHOCK WAVE LITHOTRIPSY Right 06/20/2013   Procedure: EXTRACORPOREAL SHOCK WAVE LITHOTRIPSY (ESWL);  Surgeon: Edwin Dada, MD;  Location: [facility];  Service: Urology;  Laterality: Right;  . LITHOTRIPSY    . TUBAL LIGATION  2017   Social History   Socioeconomic History  . Marital status: Single    Spouse  name: Not on file  . Number of children: Not on file  . Years of education: Not on file  . Highest education level: Not on file  Occupational History  . Not on file  Social Needs  . Financial resource strain: Not on file  . Food insecurity:    Worry: Not on file    Inability: Not on file  . Transportation needs:    Medical: Not on file    Non-medical: Not on file  Tobacco Use  . Smoking status: Current Some Day Smoker    Packs/day: 0.25    Types: Cigarettes  . Smokeless tobacco: Never Used  Substance and Sexual Activity  . Alcohol use: Yes    Comment: occ  . Drug use: No  . Sexual activity:  Yes    Birth control/protection: IUD  Lifestyle  . Physical activity:    Days per week: Not on file    Minutes per session: Not on file  . Stress: Not on file  Relationships  . Social connections:    Talks on phone: Not on file    Gets together: Not on file    Attends religious service: Not on file    Active member of club or organization: Not on file    Attends meetings of clubs or organizations: Not on file    Relationship status: Not on file  . Intimate partner violence:    Fear of current or ex partner: Not on file    Emotionally abused: Not on file    Physically abused: Not on file    Forced sexual activity: Not on file  Other Topics Concern  . Not on file  Social History Narrative  . Not on file   Current Meds  Medication Sig  . ferrous fumarate (HEMOCYTE - 106 MG FE) 325 (106 FE) MG TABS tablet Take 1 tablet by mouth.  Marland Kitchen ibuprofen (ADVIL,MOTRIN) 800 MG tablet Take 800 mg by mouth every 8 (eight) hours as needed for moderate pain.  . tamsulosin (FLOMAX) 0.4 MG CAPS capsule Take 1 capsule (0.4 mg total) by mouth daily after supper.  . [DISCONTINUED] citalopram (CELEXA) 20 MG tablet Take 20 mg by mouth daily.  . [DISCONTINUED] clonazePAM (KLONOPIN) 0.5 MG tablet TAKE ONE TABLET BY MOUTH EVERY DAY AS NEEDED FOR ANXIETY  . [DISCONTINUED] traZODone (DESYREL) 50 MG tablet Take 50 mg by mouth at bedtime.   Family History  Problem Relation Age of Onset  . Depression Mother   . Asthma Mother   . Hypertension Mother   . Heart disease Father   . Kidney disease Father    Allergies  Allergen Reactions  . Penicillins Hives  . Phenergan [Promethazine Hcl]   . Zofran [Ondansetron Hcl] Other (See Comments)    "makes nerves jerk"     ROS: Per HPI  Objective: Office vital signs reviewed. BP 101/74   Pulse 87   Temp 97.6 F (36.4 C) (Oral)   Ht 5\' 1"  (1.549 m)   Wt 162 lb (73.5 kg)   LMP 05/09/2013   BMI 30.61 kg/m   Physical Examination:  General: Awake, alert,  well nourished, nontoxic, No acute distress HEENT: Normal    Neck: No masses palpated. No lymphadenopathy     Eyes: PERRLA, extraocular movement in tact, sclera white    Nose: nasal turbinates moist, no nasal discharge    Throat: moist mucus membranes, no erythema, no tonsillar exudate.  Airway is patent Cardio: regular rate and rhythm, S1S2 heard, no  murmurs appreciated Pulm: clear to auscultation bilaterally, no wheezes, rhonchi or rales; normal work of breathing on room air Extremities: warm, well perfused, No edema, cyanosis or clubbing; +2 pulses bilaterally MSK: normal gait and normal station Skin: dry; intact; no rashes or lesions Neuro: Alert and oriented x3.  No focal neurologic deficits Psych: Mood stable, speech normal, affect appropriate, eye contact fair.  She is frequently interrupted during the visit by telephone calls.  Depression screen [facility] 2/9 05/06/2017  Decreased Interest 0  Down, Depressed, Hopeless 2  PHQ - 2 Score 2  Altered sleeping 3  Tired, decreased energy 3  Change in appetite 3  Feeling bad or failure about yourself  1  Trouble concentrating 0  Moving slowly or fidgety/restless 0  Suicidal thoughts 0  PHQ-9 Score 12  Difficult doing work/chores Somewhat difficult    No results found for this or any previous visit (from the past 24 hour(s)).  Assessment/ Plan: 22 y.o. female   1. Cystic kidney disease Per patient report.  I actually did not have the imaging study results.  I have placed a referral to nephrology for further evaluation and management.  In the interim, we will attempt to obtain imaging studies and records from [facility]. - Ambulatory referral to Nephrology  2. Sponge kidney - Ambulatory referral to Nephrology  3. Gastroesophageal reflux disease without esophagitis Improving on H. pylori treatment.  For now, we will hold off on referral to GI since symptoms are resolving.  She will contact me if she needs refills on her PPI.  4.  Acute cystitis with hematuria 02/15/2017 with similar urinalysis results.  Today's urinalysis with 2+ blood and trace leukocytes.  She had 11-30 red blood cells and few bacteria on her urine microscopy.  This is been sent for urine culture.  04/26/2017 chemistry results reviewed.  She had normal renal function at that point.  Hemoglobin was stable.  White blood cells slightly low.  We will treat with oral Keflex twice daily for the next 5 days.  Home care instructions reviewed with patient.  She was good understanding will follow-up as needed. - Urinalysis, Complete - Urine Culture  5. Difficulty sleeping Hydroxyzine 10 mg to 20 mg p.o. nightly as needed insomnia.  Caution sedation.  Follow-up as needed.  6. Establishing care with new doctor, encounter for We will obtain records from [facility] and from [place].  7. History of drug overdose Per patient report accidental.  [facility] of anxiety disorder and depression.  She was taken off all of her medications, including Klonopin, trazodone and Celexa per her report.  She is never followed up.  She feels "fine" during today's examination.   Janora Norlander, DO [place] [phone]

## 2017-05-07 LAB — URINE CULTURE

## 2017-05-10 ENCOUNTER — Other Ambulatory Visit: Payer: Self-pay | Admitting: Family Medicine

## 2017-05-10 DIAGNOSIS — R319 Hematuria, unspecified: Secondary | ICD-10-CM

## 2017-05-13 ENCOUNTER — Telehealth: Payer: Self-pay | Admitting: Family Medicine

## 2017-05-13 NOTE — Telephone Encounter (Signed)
PT AWARE AND UNDERSTANDS

## 2017-05-13 NOTE — Telephone Encounter (Signed)
It appears that both referrals are in place.  It looks like Hilda Blades tried to contact to see if scheduled.  I will defer to her to check on referrals.  If she is in significant pain, I recommend she be seen.

## 2017-05-18 ENCOUNTER — Encounter: Payer: Self-pay | Admitting: Family Medicine

## 2017-05-18 DIAGNOSIS — K429 Umbilical hernia without obstruction or gangrene: Secondary | ICD-10-CM | POA: Insufficient documentation

## 2017-05-18 DIAGNOSIS — Z87442 Personal history of urinary calculi: Secondary | ICD-10-CM | POA: Insufficient documentation

## 2017-07-07 ENCOUNTER — Other Ambulatory Visit: Payer: Self-pay | Admitting: Cardiothoracic Surgery

## 2017-07-07 DIAGNOSIS — R072 Precordial pain: Secondary | ICD-10-CM | POA: Insufficient documentation

## 2017-07-07 DIAGNOSIS — R911 Solitary pulmonary nodule: Secondary | ICD-10-CM

## 2017-07-07 NOTE — Progress Notes (Unsigned)
ct 

## 2017-07-26 ENCOUNTER — Ambulatory Visit: Payer: Medicaid Other | Admitting: Family Medicine

## 2017-07-26 NOTE — Progress Notes (Deleted)
Kellie Martinez is a 22 y.o. female presents to office today for annual physical exam examination.    Concerns today include: 1. ***  Occupation: ***, Marital status: ***, Substance use: *** Diet: ***, Exercise: *** Last eye exam: *** Last dental exam: *** Last colonoscopy: *** Last mammogram: *** Last pap smear: *** Refills needed today: *** Immunizations needed: Flu Vaccine: {YES/NO/WILD PYPPJ:09326}  Tdap Vaccine: {YES/NO/WILD ZTIWP:80998}  - every 30yrs - (<3 lifetime doses or unknown): all wounds -- look up need for Tetanus IG - (>=3 lifetime doses): clean/minor wound if >40yrs from previous; all other wounds if >58yrs from previous Zoster Vaccine: {YES/NO/WILD CARDS:18581} (those >50yo, once) Pneumonia Vaccine: {YES/NO/WILD PJASN:05397} (those w/ risk factors) - (<54yr) Both: Immunocompromised, cochlear implant, CSF leak, asplenic, sickle cell, Chronic Renal Failure - (<62yr) PPSV-23 only: Heart dz, lung disease, DM, tobacco abuse, alcoholism, cirrhosis/liver disease. - (>43yr): PPSV13 then PPSV23 in 6-12mths;  - (>11yr): repeat PPSV23 once if pt received prior to 24yo and 88yrs have passed  Past Medical History:  Diagnosis Date  . Anemia   . Anxiety   . Asthma   . Chronic kidney disease   . GERD (gastroesophageal reflux disease)   . Renal calculus or stone    had stent/ lithotripsy in past  . Sleep apnea   . Substance abuse ([place])    Social History   Socioeconomic History  . Marital status: Single    Spouse name: Not on file  . Number of children: Not on file  . Years of education: Not on file  . Highest education level: Not on file  Occupational History  . Not on file  Social Needs  . Financial resource strain: Not on file  . Food insecurity:    Worry: Not on file    Inability: Not on file  . Transportation needs:    Medical: Not on file    Non-medical: Not on file  Tobacco Use  . Smoking status: Current Some Day Smoker    Packs/day: 0.25    Types:  Cigarettes  . Smokeless tobacco: Never Used  Substance and Sexual Activity  . Alcohol use: Yes    Comment: occ  . Drug use: No  . Sexual activity: Yes    Birth control/protection: IUD  Lifestyle  . Physical activity:    Days per week: Not on file    Minutes per session: Not on file  . Stress: Not on file  Relationships  . Social connections:    Talks on phone: Not on file    Gets together: Not on file    Attends religious service: Not on file    Active member of club or organization: Not on file    Attends meetings of clubs or organizations: Not on file    Relationship status: Not on file  . Intimate partner violence:    Fear of current or ex partner: Not on file    Emotionally abused: Not on file    Physically abused: Not on file    Forced sexual activity: Not on file  Other Topics Concern  . Not on file  Social History Narrative  . Not on file   Past Surgical History:  Procedure Laterality Date  . CYSTOSCOPY     x2 with stone extraction  . EXTRACORPOREAL SHOCK WAVE LITHOTRIPSY Right 06/20/2013   Procedure: EXTRACORPOREAL SHOCK WAVE LITHOTRIPSY (ESWL);  Surgeon: Edwin Dada, MD;  Location: [facility];  Service: Urology;  Laterality: Right;  . LITHOTRIPSY    .  TUBAL LIGATION  2017   Family History  Problem Relation Age of Onset  . Depression Mother   . Asthma Mother   . Hypertension Mother   . Heart disease Father   . Kidney disease Father     Current Outpatient Medications:  .  hydrOXYzine (ATARAX/VISTARIL) 10 MG tablet, Take 1-2 tablets (10-20 mg total) by mouth at bedtime. (for sleep), Disp: 30 tablet, Rfl: 0 .  ibuprofen (ADVIL,MOTRIN) 800 MG tablet, Take 800 mg by mouth every 8 (eight) hours as needed for moderate pain., Disp: , Rfl:  .  tamsulosin (FLOMAX) 0.4 MG CAPS capsule, Take 1 capsule (0.4 mg total) by mouth daily after supper., Disp: 30 capsule, Rfl: 0  Allergies  Allergen Reactions  . Penicillins Hives    No anaphylaxis  . Phenergan [Promethazine  Hcl]      ROS: Review of Systems {ros; complete:30496}    Physical exam {Exam, [phone]}    Assessment/ Plan: Kellie Martinez here for annual physical exam.   No problem-specific Assessment & Plan notes found for this encounter.   Counseled on healthy lifestyle choices, including diet (rich in fruits, vegetables and lean meats and low in salt and simple carbohydrates) and exercise (at least 30 minutes of moderate physical activity daily).  Patient to follow up in 1 year for annual exam or sooner if needed.  Ashly M. Lajuana Ripple, DO

## 2017-07-27 ENCOUNTER — Encounter: Payer: Self-pay | Admitting: Family Medicine

## 2017-07-29 ENCOUNTER — Ambulatory Visit (INDEPENDENT_AMBULATORY_CARE_PROVIDER_SITE_OTHER): Payer: Medicaid Other | Admitting: Urology

## 2017-07-29 DIAGNOSIS — N761 Subacute and chronic vaginitis: Secondary | ICD-10-CM | POA: Diagnosis not present

## 2017-07-29 DIAGNOSIS — N200 Calculus of kidney: Secondary | ICD-10-CM

## 2017-07-29 DIAGNOSIS — Q615 Medullary cystic kidney: Secondary | ICD-10-CM

## 2017-07-29 DIAGNOSIS — R3121 Asymptomatic microscopic hematuria: Secondary | ICD-10-CM

## 2017-08-01 ENCOUNTER — Ambulatory Visit
Admission: RE | Admit: 2017-08-01 | Discharge: 2017-08-01 | Disposition: A | Discharge status: Home / Self Care | Payer: Medicaid Other | Source: Ambulatory Visit | Attending: Cardiothoracic Surgery | Admitting: Cardiothoracic Surgery

## 2017-08-01 DIAGNOSIS — R911 Solitary pulmonary nodule: Secondary | ICD-10-CM

## 2017-08-01 IMAGING — CT CT CHEST W/ CM
2 of 4 series · 11 of 36 positions shown, 13 images · IV contrast (omnipaque)
Comparison: Chest CT angiogram dated [DATE]. Chest x-ray dated
[DATE].

CLINICAL DATA: Follow-up nodule. Chest pain, shortness of breath.
Smoker.

EXAM:
CT CHEST WITH CONTRAST
TECHNIQUE: Multidetector CT imaging of the chest was performed during
intravenous contrast administration.
CONTRAST:  75mL OMNIPAQUE IOHEXOL 300 MG/ML  SOLN

[Series 2: chest 2.00 br40 s3 ax · axial · 0.50mm/px · z∈[+1485,+1705]mm · 8 of 131 slices shown, 10 images]
[im 11/131  mediastinal]
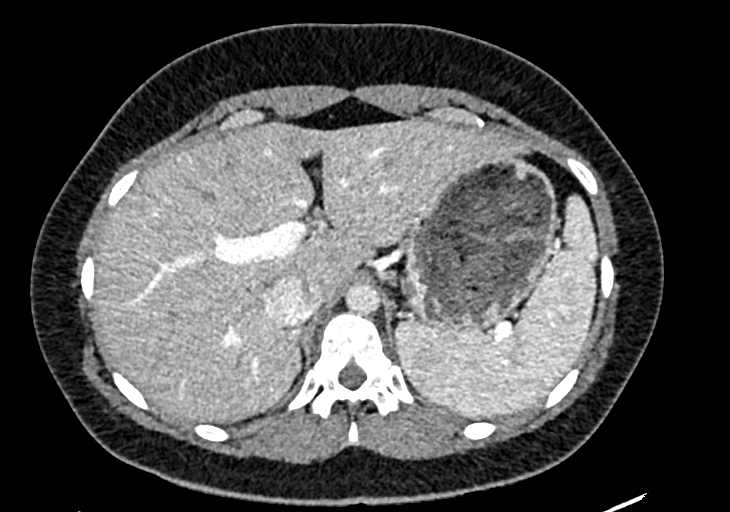
[im 11/131  lung]
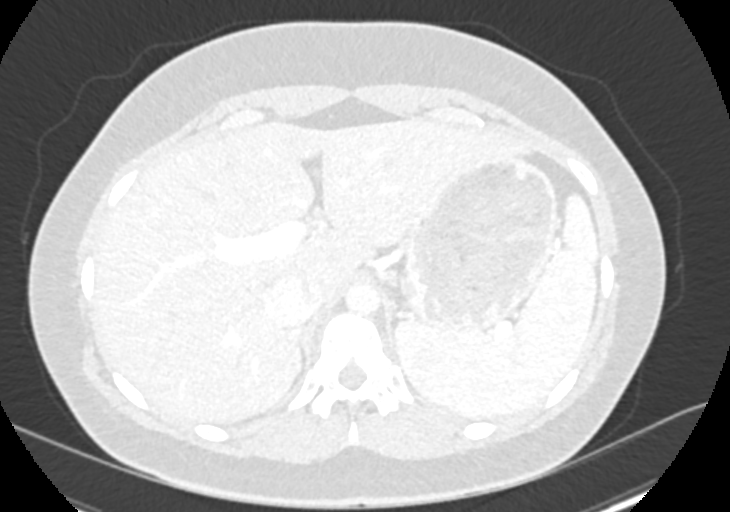
[im 31/131  lung]
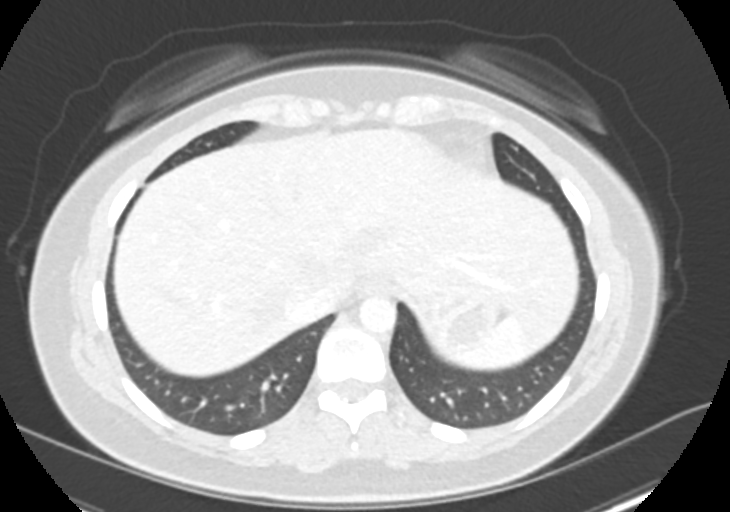
[im 41/131  lung]
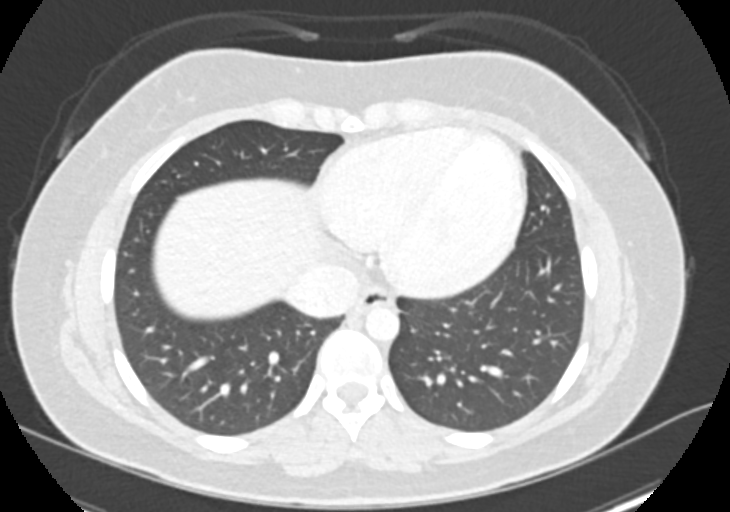
[im 61/131  lung]
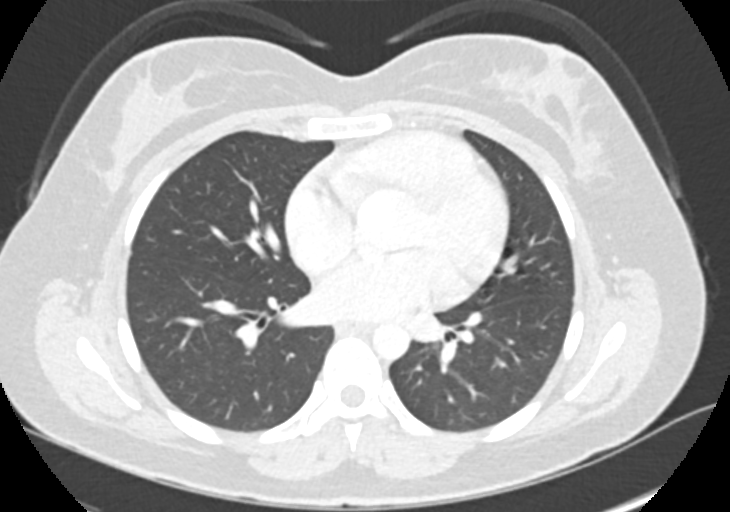
[im 71/131  mediastinal]
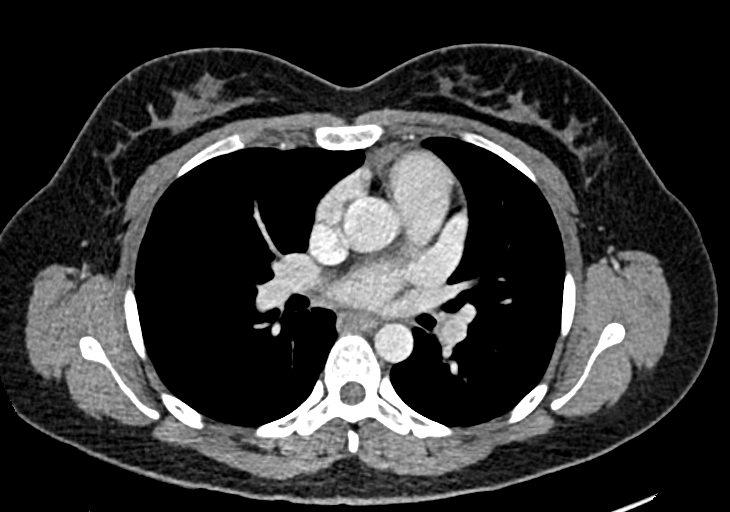
[im 71/131  lung]
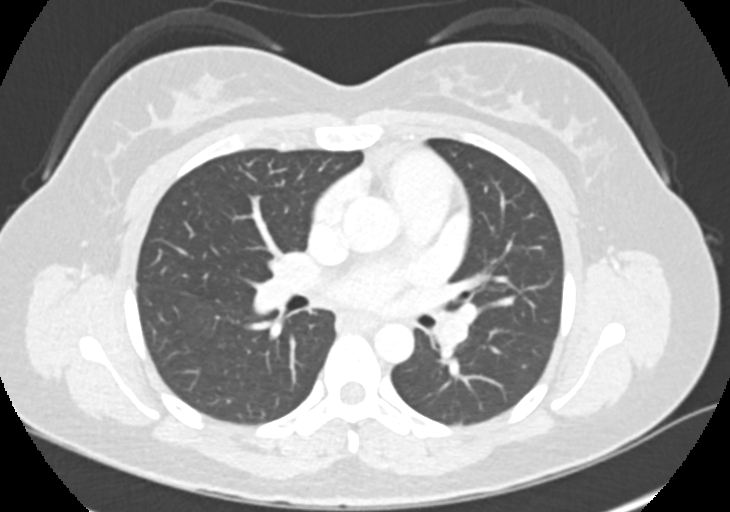
[im 91/131  lung]
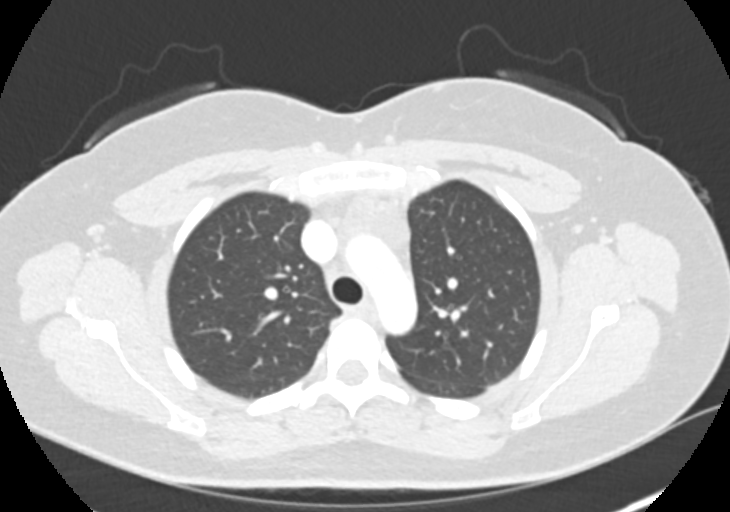
[im 101/131  lung]
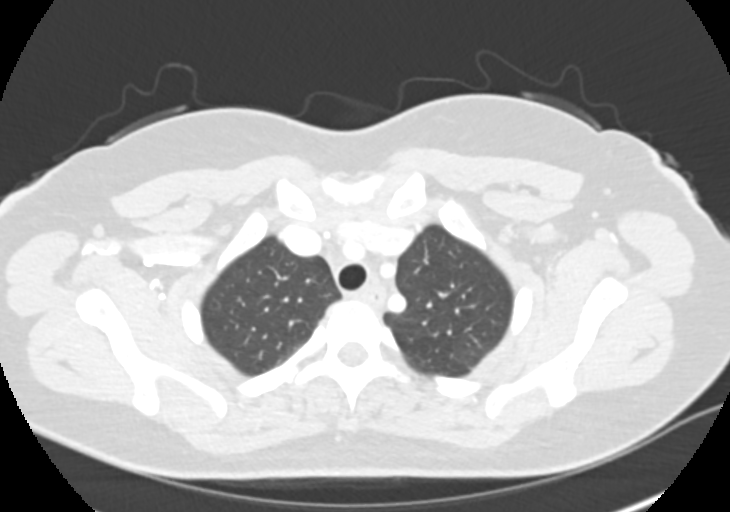
[im 121/131  lung]
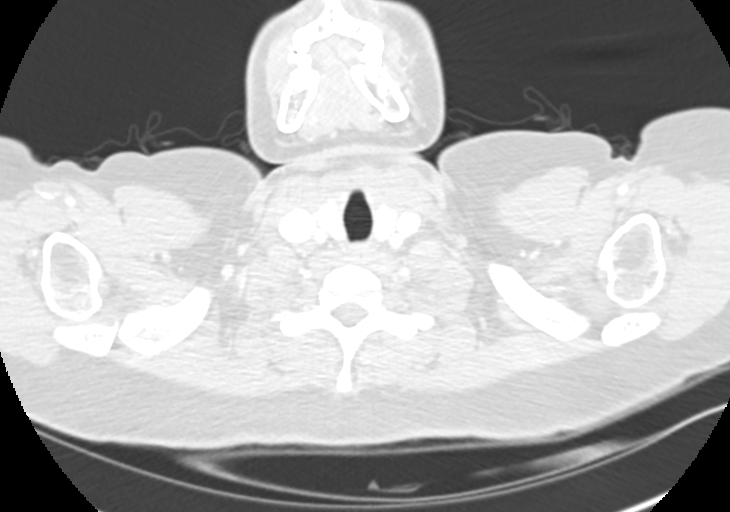

[Series 4: chest 2.00 br40 s3 cor · coronal · 0.51mm/px · 3 of 127 slices shown]
[im 26/127  lung]
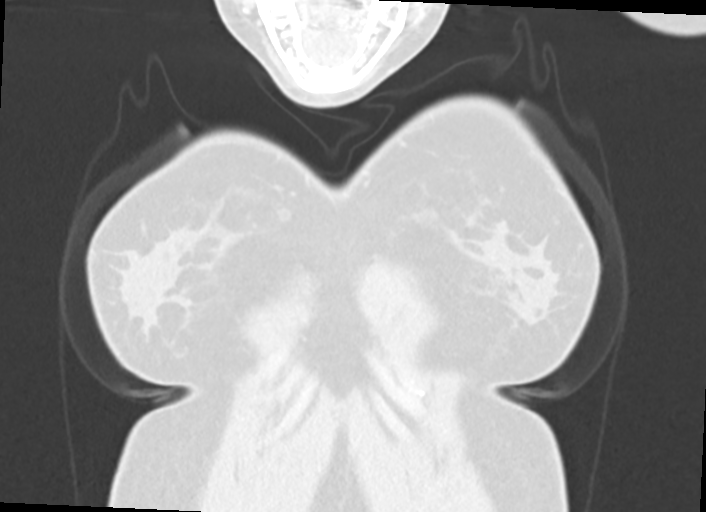
[im 51/127  lung]
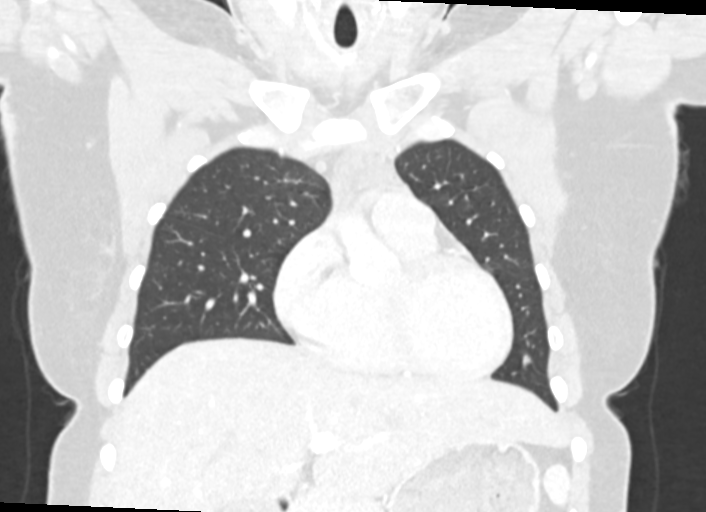
[im 76/127  lung]
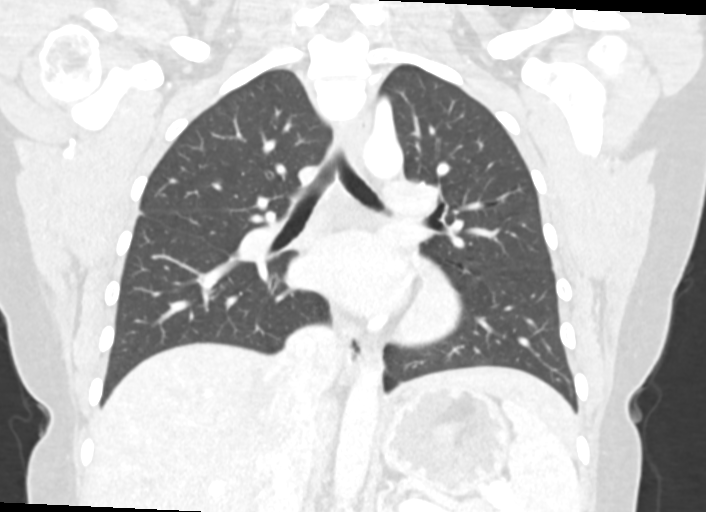

[11 of 36 positions shown; findings below may reference images not displayed]

FINDINGS: Cardiovascular: Heart size is normal. No pericardial effusion.
Thoracic aorta is normal in caliber and configuration.

Mediastinum/Nodes: Previously questioned soft tissue thickening
within the anterior mediastinum has a benign appearance on today's
exam, consistent with a combination of benign pericardial recess and
normal residual thymic tissue.

No mass or enlarged lymph nodes within the mediastinum or perihilar
regions. Esophagus appears normal. Trachea and central bronchi are
unremarkable.

Lungs/Pleura: Lungs are clear. No pulmonary nodule or mass. No
pleural effusion or pneumothorax.

Upper Abdomen: Limited images of the upper abdomen are unremarkable.

Musculoskeletal: No acute or significant osseous abnormality.
IMPRESSION: Normal chest CT. The previously questioned soft tissue thickening
within the anterior mediastinum has a benign appearance on today's
exam, consistent with a combination of benign pericardial recess and
normal residual thymic tissue.

## 2017-08-01 MED ORDER — IOHEXOL 300 MG/ML  SOLN
75.0000 mL | Freq: Once | INTRAMUSCULAR | Status: AC | PRN
  Administered 2017-08-01: 75 mL via INTRAVENOUS

## 2017-08-03 ENCOUNTER — Ambulatory Visit: Payer: Medicaid Other | Admitting: Cardiothoracic Surgery

## 2017-08-17 ENCOUNTER — Ambulatory Visit: Payer: Medicaid Other | Admitting: Family

## 2017-08-17 ENCOUNTER — Ambulatory Visit: Payer: Medicaid Other | Admitting: Cardiothoracic Surgery

## 2017-08-29 ENCOUNTER — Ambulatory Visit: Payer: Medicaid Other | Admitting: Family

## 2017-08-31 ENCOUNTER — Ambulatory Visit: Payer: Medicaid Other | Admitting: Cardiothoracic Surgery

## 2017-09-05 ENCOUNTER — Ambulatory Visit (INDEPENDENT_AMBULATORY_CARE_PROVIDER_SITE_OTHER): Payer: Medicaid Other | Admitting: Physician Assistant

## 2017-09-05 ENCOUNTER — Encounter: Payer: Self-pay | Admitting: Physician Assistant

## 2017-09-05 VITALS — BP 107/73 | HR 88 | Temp 97.0°F | Ht 61.0 in | Wt 165.4 lb

## 2017-09-05 DIAGNOSIS — Z202 Contact with and (suspected) exposure to infections with a predominantly sexual mode of transmission: Secondary | ICD-10-CM | POA: Diagnosis not present

## 2017-09-05 DIAGNOSIS — H7292 Unspecified perforation of tympanic membrane, left ear: Secondary | ICD-10-CM

## 2017-09-05 DIAGNOSIS — Z0000 Encounter for general adult medical examination without abnormal findings: Secondary | ICD-10-CM

## 2017-09-05 LAB — BAYER DCA HB A1C WAIVED: HB A1C: 4.7 % (ref <7.0)

## 2017-09-05 NOTE — Patient Instructions (Addendum)
Eardrum Rupture, Adult  An eardrum rupture is a hole (perforation) in the eardrum. The eardrum is a thin, round tissue inside of the ear that separates the ear canal from the middle ear. The eardrum is also called the tympanic membrane. It transfers sound vibrations through small bones in the middle ear to the hearing nerve in the inner ear. It also protects the middle ear from germs. An eardrum rupture can cause pain and hearing loss.  What are the causes?  This condition may be caused by:  · An infection.  · A sudden injury, such as from:  ? Inserting a thin, sharp object into the ear.  ? A hit to the side of the head, especially by an open hand.  ? Falling onto water or a flat surface.  ? A rapid change in pressure, such as from flying or scuba diving.  ? A sudden increase in pressure against the eardrum, such as from an explosion or a very loud noise.  · Inserting a cotton-tipped swab in the ear.  · A long-term eustachian tube disorder. Eustachian tubes are parts of the body that connect each middle ear space to the back of the nose.  · A medical procedure or surgery, such as a procedure to remove wax from the ear canal.  · Removing a man-made pressure equalization tube(PE tube) that was placed through the eardrum.  · Having a PE tube fall out.    What increases the risk?  You are more likely to develop this condition if:  · You have had PE tubes inserted in your ears.  · You have an ear infection.  · You play sports that:  ? Involve balls or contact with other players.  ? Take place in water, such as diving, scuba diving, or waterskiing.    What are the signs or symptoms?  Symptoms of this condition include:  · Sudden pain at the time of the injury.  · Ear pain that suddenly improves.  · Ringing in the ear after the injury.  · Drainage from the ear. The drainage may be clear, cloudy or pus-like, or bloody.  · Hearing loss.  · Dizziness.    How is this diagnosed?  This condition is diagnosed based on your  symptoms and medical history as well as a physical exam. Your health care provider can usually see a perforation using an ear scope (otoscope). You may have tests, such as:  · A hearing test (audiogram) to check for hearing loss.  · A test in which a sample of ear drainage is tested for infection (culture).    How is this treated?  An eardrum typically heals on its own within a few weeks. If your eardrum does not heal, your health care provider may recommend a procedure to place a patch over your eardrum or surgery to repair your eardrum. Your health care provider may also prescribe antibiotic medicines to help prevent infection.  If the ear heals completely, any hearing loss should be temporary.  Follow these instructions at home:  · Keep your ear dry. This is very important. Follow instructions from your health care provider about how to keep your ear dry. You may need to wear waterproof earplugs when bathing and swimming.  · Take over-the-counter and prescription medicines only as told by your health care provider.  · Return to sports and activities as told by your health care provider. Ask your health care provider what activities are safe for you.  ·   Wear headgear with ear protection when you play sports in which ear injuries are common.  · If directed, apply heat to your affected ear as often as told by your health care provider. Use the heat source that your health care provider recommends, such as a moist heat pack or a heating pad. This will help to relieve pain.  ? Place a towel between your skin and the heat source.  ? Leave the heat on for 20-30 minutes.  ? Remove the heat if your skin turns bright red. This is especially important if you are unable to feel pain, heat, or cold. You may have a greater risk of getting burned.  · Keep all follow-up visits as told by your health care provider. This is important.  · Talk to your health care provider before traveling by plane.  Contact a health care provider  if:  · You have mucus or blood draining from your ear.  · You have a fever.  · You have ear pain.  · You have hearing loss, dizziness, or ringing in your ear.  Get help right away if:  · You have sudden hearing loss.  · You are very dizzy.  · You have severe ear pain.  · Your face feels weak or becomes paralyzed.  Summary  · An eardrum rupture is a hole (perforation) in the eardrum that can cause pain and hearing loss. It is usually caused by a sudden injury to the ear.  · The eardrum will likely heal on its own within a few weeks. In some cases, surgery may be necessary.  · After the injury, follow instructions from your health care provider about how to keep your ear dry as it heals.  This information is not intended to replace advice given to you by your health care provider. Make sure you discuss any questions you have with your health care provider.  Document Released: 01/23/2000 Document Revised: 04/02/2016 Document Reviewed: 04/02/2016  [employer]vier Interactive Patient Education © 2018 [employer]

## 2017-09-06 ENCOUNTER — Telehealth: Payer: Self-pay | Admitting: Physician Assistant

## 2017-09-06 LAB — CMP14+EGFR
ALK PHOS: 65 IU/L (ref 39–117)
ALT: 12 IU/L (ref 0–32)
AST: 14 IU/L (ref 0–40)
Albumin/Globulin Ratio: 1.8 (ref 1.2–2.2)
Albumin: 4.4 g/dL (ref 3.5–5.5)
BILIRUBIN TOTAL: 0.6 mg/dL (ref 0.0–1.2)
BUN/Creatinine Ratio: 21 (ref 9–23)
BUN: 17 mg/dL (ref 6–20)
CHLORIDE: 104 mmol/L (ref 96–106)
CO2: 23 mmol/L (ref 20–29)
Calcium: 9 mg/dL (ref 8.7–10.2)
Creatinine, Ser: 0.82 mg/dL (ref 0.57–1.00)
GFR calc non Af Amer: 100 mL/min/{1.73_m2} (ref >59)
GFR, EST AFRICAN AMERICAN: 116 mL/min/{1.73_m2} (ref >59)
Globulin, Total: 2.4 g/dL (ref 1.5–4.5)
Glucose: 83 mg/dL (ref 65–99)
Potassium: 3.9 mmol/L (ref 3.5–5.2)
Sodium: 142 mmol/L (ref 134–144)
TOTAL PROTEIN: 6.8 g/dL (ref 6.0–8.5)

## 2017-09-06 LAB — STD SCREEN (8)
HEP A IGM: NEGATIVE
HIV Screen 4th Generation wRfx: NONREACTIVE
HSV 1 GLYCOPROTEIN G AB, IGG: 52.9 {index} — AB (ref 0.00–0.90)
HSV 2 IgG, Type Spec: 19.4 index — ABNORMAL HIGH (ref 0.00–0.90)
Hep B C IgM: NEGATIVE
Hep C Virus Ab: <0.1 s/co ratio (ref 0.0–0.9)
Hepatitis B Surface Ag: NEGATIVE
RPR Ser Ql: NONREACTIVE

## 2017-09-06 LAB — CBC WITH DIFFERENTIAL/PLATELET
Basophils Absolute: 0 10*3/uL (ref 0.0–0.2)
Basos: 1 %
EOS (ABSOLUTE): 0.1 10*3/uL (ref 0.0–0.4)
Eos: 3 %
Hematocrit: 37.3 % (ref 34.0–46.6)
Hemoglobin: 12.3 g/dL (ref 11.1–15.9)
IMMATURE GRANS (ABS): 0 10*3/uL (ref 0.0–0.1)
IMMATURE GRANULOCYTES: 0 %
LYMPHS: 26 %
Lymphocytes Absolute: 1 10*3/uL (ref 0.7–3.1)
MCH: 29.4 pg (ref 26.6–33.0)
MCHC: 33 g/dL (ref 31.5–35.7)
MCV: 89 fL (ref 79–97)
Monocytes Absolute: 0.3 10*3/uL (ref 0.1–0.9)
Monocytes: 8 %
NEUTROS ABS: 2.4 10*3/uL (ref 1.4–7.0)
NEUTROS PCT: 62 %
Platelets: 185 10*3/uL (ref 150–450)
RBC: 4.18 x10E6/uL (ref 3.77–5.28)
RDW: 13.9 % (ref 12.3–15.4)
WBC: 3.9 10*3/uL (ref 3.4–10.8)
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
## 2017-09-07 LAB — GC/CHLAMYDIA PROBE AMP
CHLAMYDIA, DNA PROBE: NEGATIVE
Neisseria gonorrhoeae by PCR: NEGATIVE

## 2017-09-12 ENCOUNTER — Ambulatory Visit: Payer: Medicaid Other | Admitting: Physician Assistant

## 2017-09-13 ENCOUNTER — Encounter: Payer: Self-pay | Admitting: Physician Assistant

## 2017-09-16 ENCOUNTER — Telehealth: Payer: Self-pay | Admitting: Physician Assistant

## 2017-09-16 NOTE — Telephone Encounter (Signed)
Pt has called today because her ear is very painful, bloody yellowish puss is coming out of it, it does have and odor, I offered pt an apt she said she doesn't have a way here. She uses [employer]

## 2017-09-30 ENCOUNTER — Ambulatory Visit: Payer: Medicaid Other | Admitting: Family Medicine

## 2017-10-05 ENCOUNTER — Encounter: Payer: Self-pay | Admitting: Physician Assistant

## 2018-02-20 ENCOUNTER — Ambulatory Visit (INDEPENDENT_AMBULATORY_CARE_PROVIDER_SITE_OTHER): Payer: Medicaid Other | Admitting: Family Medicine

## 2018-02-20 ENCOUNTER — Encounter: Payer: Self-pay | Admitting: Family Medicine

## 2018-02-20 VITALS — BP 107/72 | HR 71 | Temp 97.3°F | Resp 17 | Ht 61.0 in | Wt 171.8 lb

## 2018-02-20 DIAGNOSIS — N644 Mastodynia: Secondary | ICD-10-CM

## 2018-02-20 DIAGNOSIS — N926 Irregular menstruation, unspecified: Secondary | ICD-10-CM

## 2018-02-20 DIAGNOSIS — Z803 Family history of malignant neoplasm of breast: Secondary | ICD-10-CM

## 2018-02-20 DIAGNOSIS — Z7689 Persons encountering health services in other specified circumstances: Secondary | ICD-10-CM

## 2018-02-20 DIAGNOSIS — N6320 Unspecified lump in the left breast, unspecified quadrant: Secondary | ICD-10-CM

## 2018-02-20 DIAGNOSIS — N6310 Unspecified lump in the right breast, unspecified quadrant: Secondary | ICD-10-CM | POA: Diagnosis not present

## 2018-02-20 DIAGNOSIS — N63 Unspecified lump in unspecified breast: Secondary | ICD-10-CM

## 2018-02-20 NOTE — Patient Instructions (Addendum)
Thank you for choosing Primary Care at [facility] to be your medical home!    Kellie Martinez was seen by Molli Barrows, FNP today.   Kellie Martinez's primary care provider is Scot Jun, FNP.   For the best care possible, you should try to see Molli Barrows, FNP-C whenever you come to the clinic.   We look forward to seeing you again soon!  If you have any questions about your visit today, please call us at [phone] or feel free to reach your primary care provider via [place].     To schedule your breast exam, please call Breast Clinic at  Phone: [phone]       Breast Self-Awareness Breast self-awareness means:  Knowing how your breasts look.  Knowing how your breasts feel.  Checking your breasts every month for changes.  Telling your doctor if you notice a change in your breasts. Breast self-awareness allows you to notice a breast problem early while it is still small. How to do a breast self-exam One way to learn what is normal for your breasts and to check for changes is to do a breast self-exam. To do a breast self-exam: Look for Changes  1. Take off all the clothes above your waist. 2. Stand in front of a mirror in a room with good lighting. 3. Put your hands on your hips. 4. Push your hands down. 5. Look at your breasts and nipples in the mirror to see if one breast or nipple looks different than the other. Check to see if: ? The shape of one breast is different. ? The size of one breast is different. ? There are wrinkles, dips, and bumps in one breast and not the other. 6. Look at each breast for changes in your skin, such as: ? Redness. ? Scaly areas. 7. Look for changes in your nipples, such as: ? Liquid around the nipples. ? Bleeding. ? Dimpling. ? Redness. ? A change in where the nipples are. Feel for Changes 1. Lie on your back on the floor. 2. Feel each breast. To do this, follow these steps: ? Pick a breast to  feel. ? Put the arm closest to that breast above your head. ? Use your other arm to feel the nipple area of your breast. Feel the area with the pads of your three middle fingers by making small circles with your fingers. For the first circle, press lightly. For the second circle, press harder. For the third circle, press even harder. ? Keep making circles with your fingers at the light, harder, and even harder pressures as you move down your breast. Stop when you feel your ribs. ? Move your fingers a little toward the center of your body. ? Start making circles with your fingers again, this time going up until you reach your collarbone. ? Keep making up and down circles until you reach your armpit. Remember to keep using the three pressures. ? Feel the other breast in the same way. 3. Sit or stand in the shower or tub. 4. With soapy water on your skin, feel each breast the same way you did in step 2, when you were lying on the floor. Write Down What You Find After doing the self-exam, write down:  What is normal for each breast.  Any changes you find in each breast.  When you last had your period.  How often should I check my breasts? Check your breasts every month. If you are  breastfeeding, the best time to check them is after you feed your baby or after you use a breast pump. If you get periods, the best time to check your breasts is 5-7 days after your period is over. When should I see my doctor? See your doctor if you notice:  A change in shape or size of your breasts or nipples.  A change in the skin of your breast or nipples, such as red or scaly skin.  Unusual fluid coming from your nipples.  A lump or thick area that was not there before.  Pain in your breasts.  Anything that concerns you. This information is not intended to replace advice given to you by your health care provider. Make sure you discuss any questions you have with your health care provider. Document  Released: 07/14/2007 Document Revised: 07/03/2015 Document Reviewed: 12/15/2014 Elsevier Interactive Patient Education  2019 [place].    Breast Ultrasound  Breast ultrasound, or breast ultrasonogram, is a procedure that is used to check for breast problems. A breast ultrasound uses harmless sound waves and a computer to create pictures of the inside of your breast. This procedure can help determine whether a lump in your breast is a fluid-filled sac (cyst), a solid tumor, or a growth. It can also help determine whether a tumor is cancerous (malignant) or noncancerous (benign). Sometimes a breast ultrasound is done to locate nodules in the breast that are too small to feel but need to be removed during surgery. A breast ultrasound takes about 30 minutes. Tell a health care provider about:  Any allergies you have.  All medicines you are taking, including vitamins, herbs, eye drops, creams, and over-the-counter medicines.  Any blood disorders you have.  Any surgeries you have had.  Any medical conditions you have. What are the risks? Breast ultrasound is safe and painless. Ultrasound imaging does not use X-rays. There are no known risks for this procedure. What happens before the procedure? You do not need to prepare for a breast ultrasound. You will undress from your waist up, so wear loose, comfortable clothing on the day of the procedure. What happens during the procedure?  You will lie down on an examining table.  A health care provider will apply gel to your breast area.  You may be asked to hold your arm above your head.  The health care provider will move a handheld probe, which looks like a microphone, back and forth over your breast.  The probe will send signals to a computer that will create images of your breast.  When the procedure is over, the gel will be cleaned from your breast, and you can get dressed. What happens after the procedure?  You can go home right  away and do all of your usual activities.  It is up to you to get the results of your procedure. Ask your health care provider, or the department that is doing the procedure, when your results will be ready. Summary  A breast ultrasound is a procedure that is used to check for breast problems.  This procedure uses harmless sound waves and a computer to create pictures of the inside of your breast.  This is a safe procedure. This information is not intended to replace advice given to you by your health care provider. Make sure you discuss any questions you have with your health care provider. Document Released: 02/17/2004 Document Revised: 10/02/2015 Document Reviewed: 09/29/2015 Elsevier Interactive Patient Education  2019 [employer].
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
## 2018-02-23 ENCOUNTER — Other Ambulatory Visit: Payer: Self-pay | Admitting: Family Medicine

## 2018-02-23 DIAGNOSIS — N63 Unspecified lump in unspecified breast: Secondary | ICD-10-CM

## 2018-02-23 DIAGNOSIS — N644 Mastodynia: Secondary | ICD-10-CM

## 2018-02-27 ENCOUNTER — Telehealth: Payer: Self-pay | Admitting: Family Medicine

## 2018-02-27 NOTE — Telephone Encounter (Signed)
This has not been addressed with provider. She would need visit to discuss.

## 2018-02-27 NOTE — Telephone Encounter (Signed)
Patient called stating that she would like to be placed on medication for her ADHD, lack of sleep and bipolar disorder. Please follow up.

## 2018-03-09 ENCOUNTER — Other Ambulatory Visit: Payer: Medicaid Other

## 2018-03-09 ENCOUNTER — Ambulatory Visit
Admission: RE | Admit: 2018-03-09 | Discharge: 2018-03-09 | Disposition: A | Discharge status: Home / Self Care | Payer: Medicaid Other | Source: Ambulatory Visit | Attending: Family Medicine | Admitting: Family Medicine

## 2018-03-09 ENCOUNTER — Other Ambulatory Visit: Payer: Self-pay | Admitting: Family Medicine

## 2018-03-09 DIAGNOSIS — N63 Unspecified lump in unspecified breast: Secondary | ICD-10-CM

## 2018-03-09 DIAGNOSIS — N644 Mastodynia: Secondary | ICD-10-CM

## 2018-03-09 DIAGNOSIS — N6320 Unspecified lump in the left breast, unspecified quadrant: Secondary | ICD-10-CM

## 2018-03-09 IMAGING — US ULTRASOUND LEFT BREAST LIMITED
1 series · 13 of 13 positions shown · non-contrast
Comparison: None.

CLINICAL DATA: Intermittent diffuse left breast pain for the past
3-4 months. She feels 3 masses in the left breast and one in the
right breast. Family history of breast cancer in a maternal
grandmother, paternal aunt and paternal cousin.

EXAM:
ULTRASOUND OF THE BILATERAL BREAST

[Series 1: ultrasound left breast limited · 0.04mm/px · 13 of 13 slices shown]
[im 1/13]
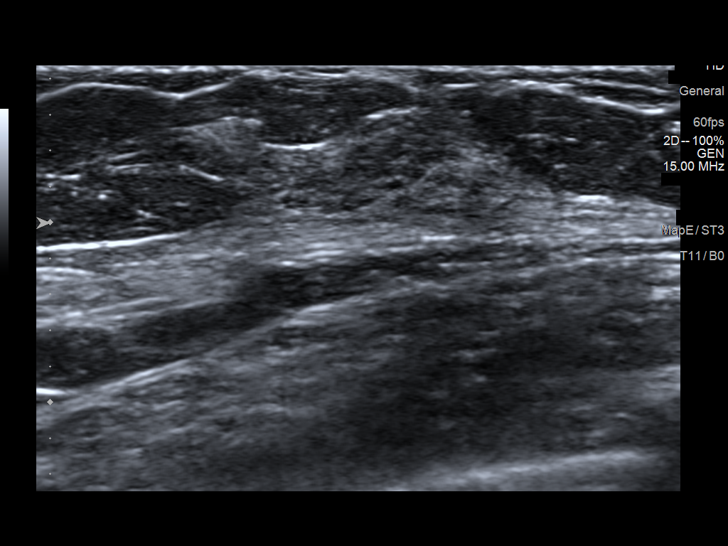
[im 2/13]
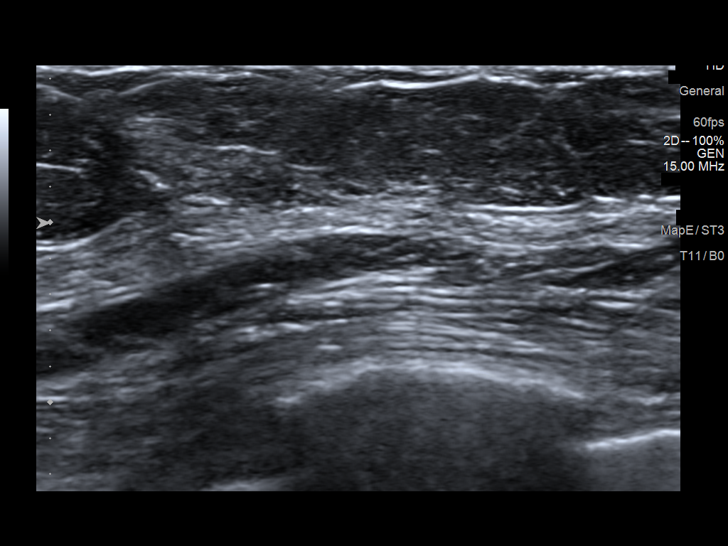
[im 3/13]
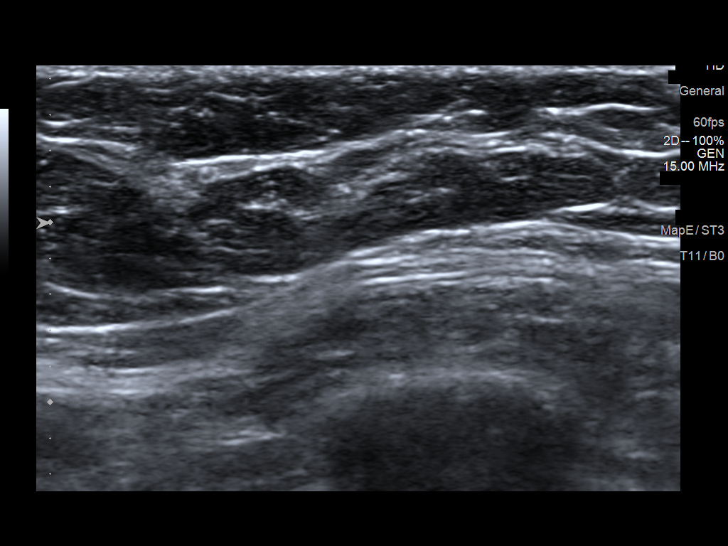
[im 4/13]
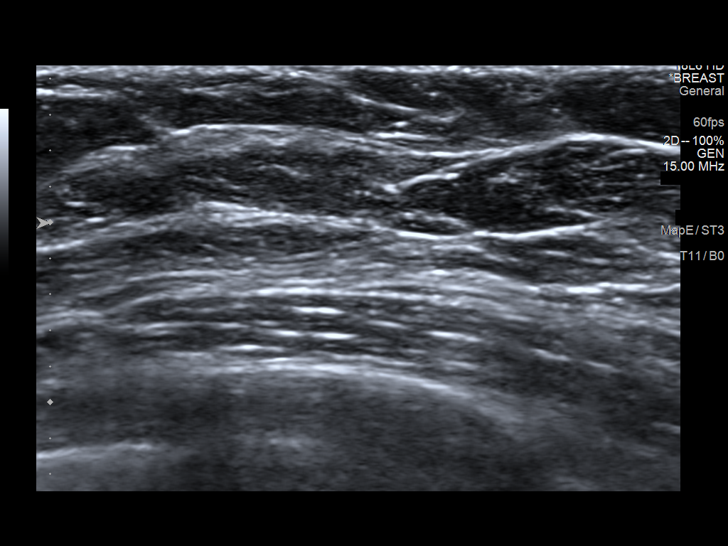
[im 5/13]
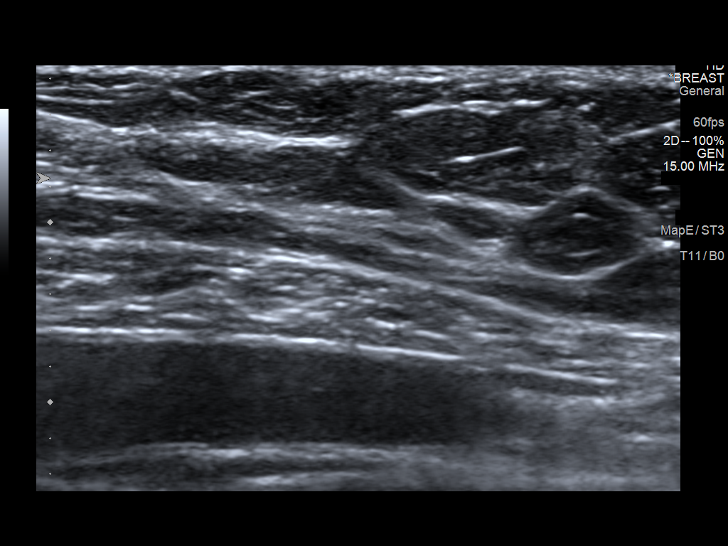
[im 6/13]
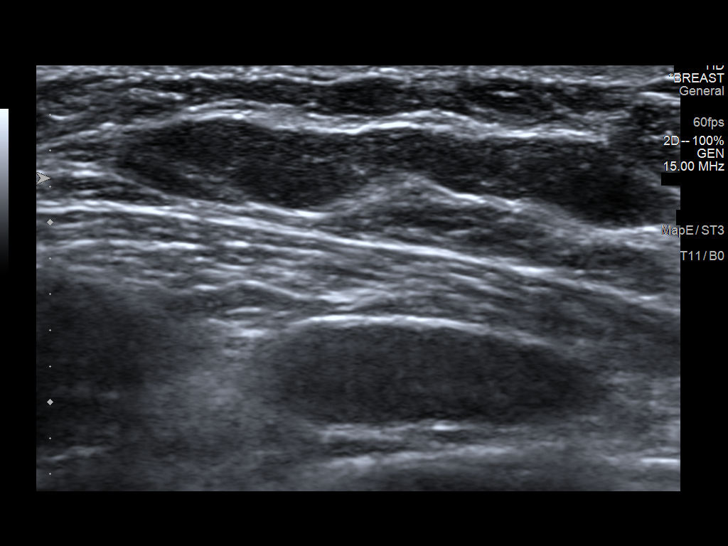
[im 7/13]
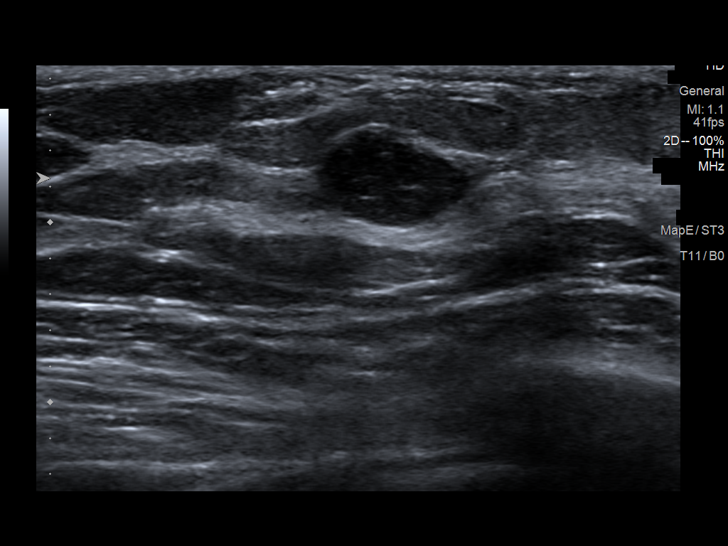
[im 8/13]
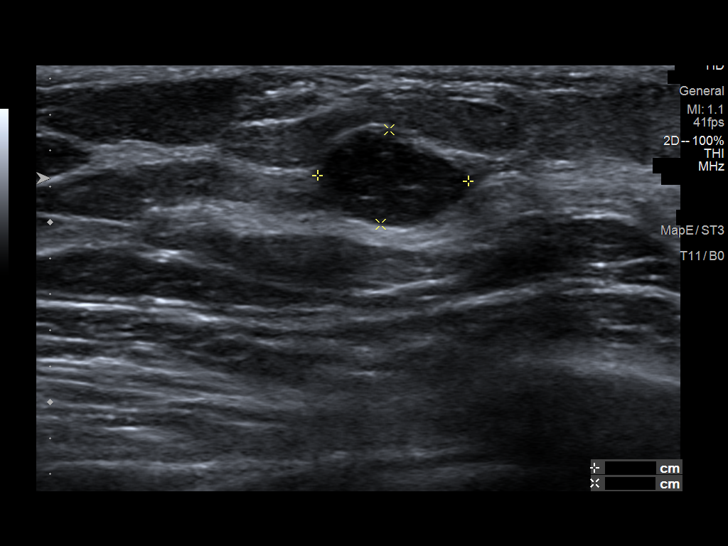
[im 9/13]
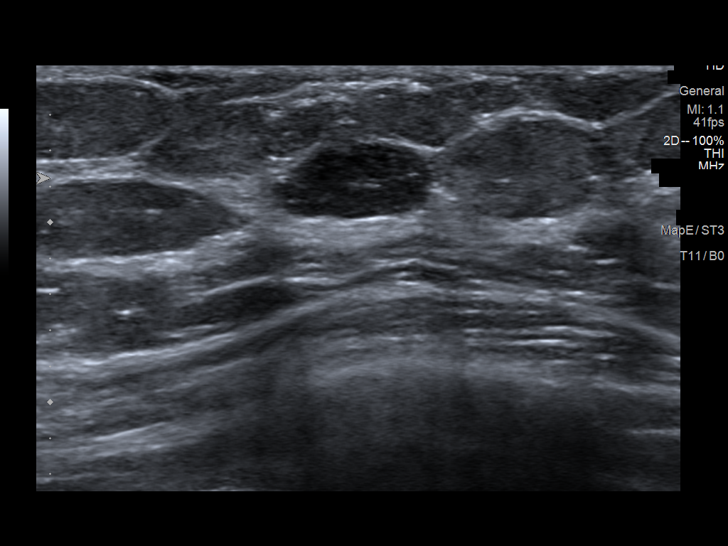
[im 10/13]
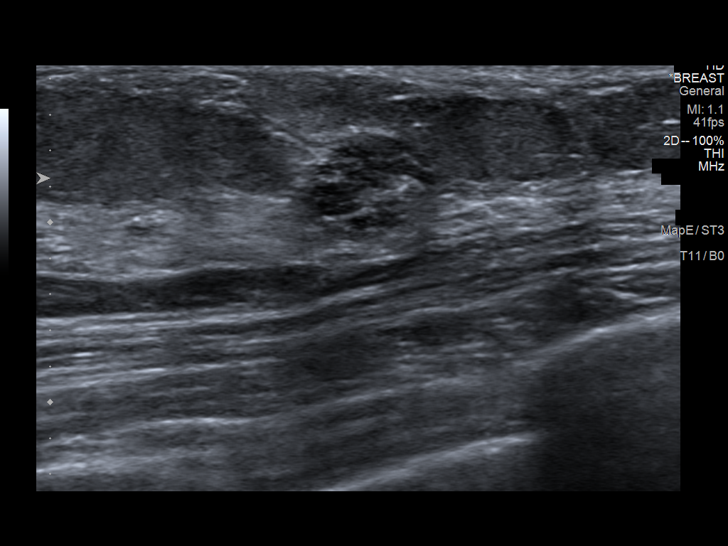
[im 11/13]
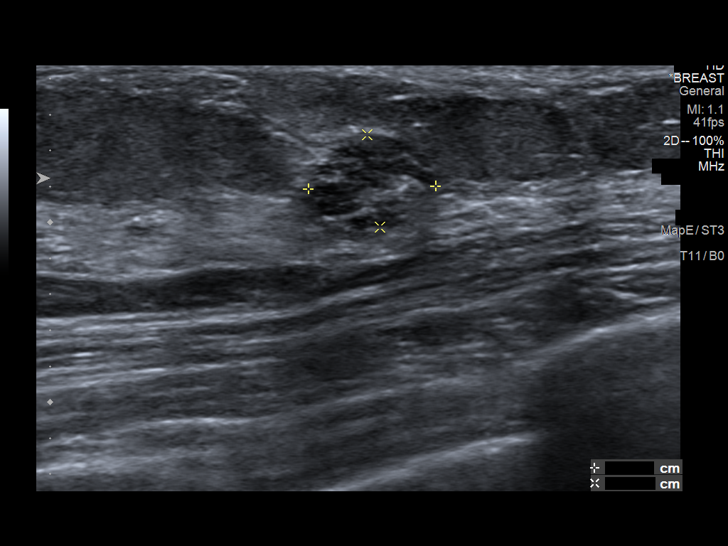
[im 12/13]
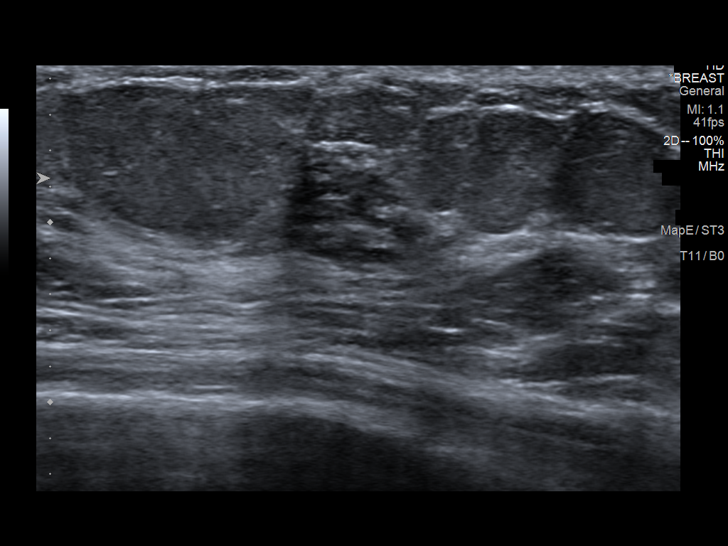
[im 13/13]
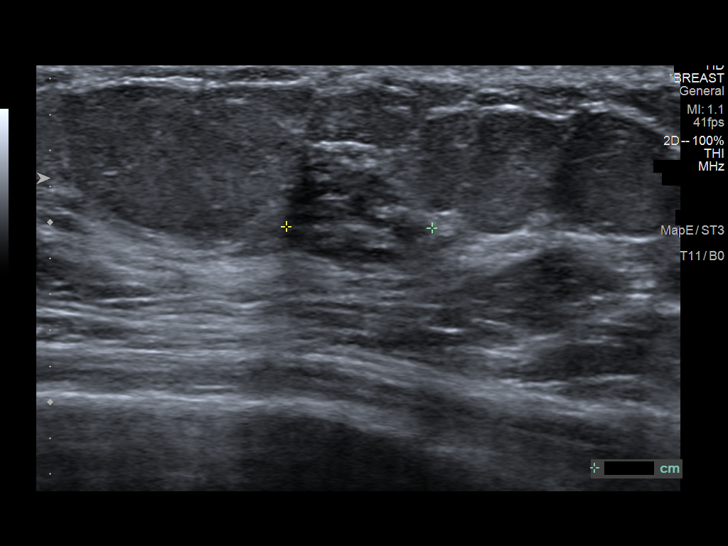

[13 of 13 positions shown; findings below may reference images not displayed]

FINDINGS: On physical exam, no discrete mass is palpable at the locations of
patient concern in both breasts.

Targeted ultrasound is performed, showing a 5 mm cyst in the 9
o'clock position of the right breast, 2 cm from the nipple.

An 8 x 7 x 5 mm cluster of microcysts in the 2:30 o'clock position
of the left breast, 3 cm from the nipple.

A 9 x 8 x 5 mm oval, horizontally oriented, circumscribed,
hypoechoic mass with thin partial internal septations in the 10
o'clock position of the left breast, 5 cm from the nipple.
IMPRESSION: 1. 9 mm probable fibroadenoma in the 10 o'clock position of the left
breast.
2. 8 mm benign cluster of microcysts in the 2:30 o'clock position of
the left breast.
3. 5 mm benign cyst in the 9 o'clock position of the right breast.

RECOMMENDATION:
Follow-up left breast ultrasound in 6 months to re-evaluate the
probable fibroadenoma in the 10 o'clock position of the breast. The
option of ultrasound-guided core needle biopsy was discussed with
the patient but not recommended at this time. She stated that she
would like to have the area biopsied. Therefore this has been
scheduled at [DATE] p.m. on [DATE].

I have discussed the findings and recommendations with the patient.
Results were also provided in writing at the conclusion of the
visit. If applicable, a reminder letter will be sent to the patient
regarding the next appointment.

BI-RADS CATEGORY  3: Probably benign.

## 2018-03-09 IMAGING — US ULTRASOUND RIGHT BREAST LIMITED
2 series · 6 of 6 positions shown · non-contrast
Comparison: None.

CLINICAL DATA: Intermittent diffuse left breast pain for the past
3-4 months. She feels 3 masses in the left breast and one in the
right breast. Family history of breast cancer in a maternal
grandmother, paternal aunt and paternal cousin.

EXAM:
ULTRASOUND OF THE BILATERAL BREAST

[Series 1: ultrasound right breast limited · 0.06mm/px · 2 of 2 slices shown (1 of 2)]
[im 1/2]
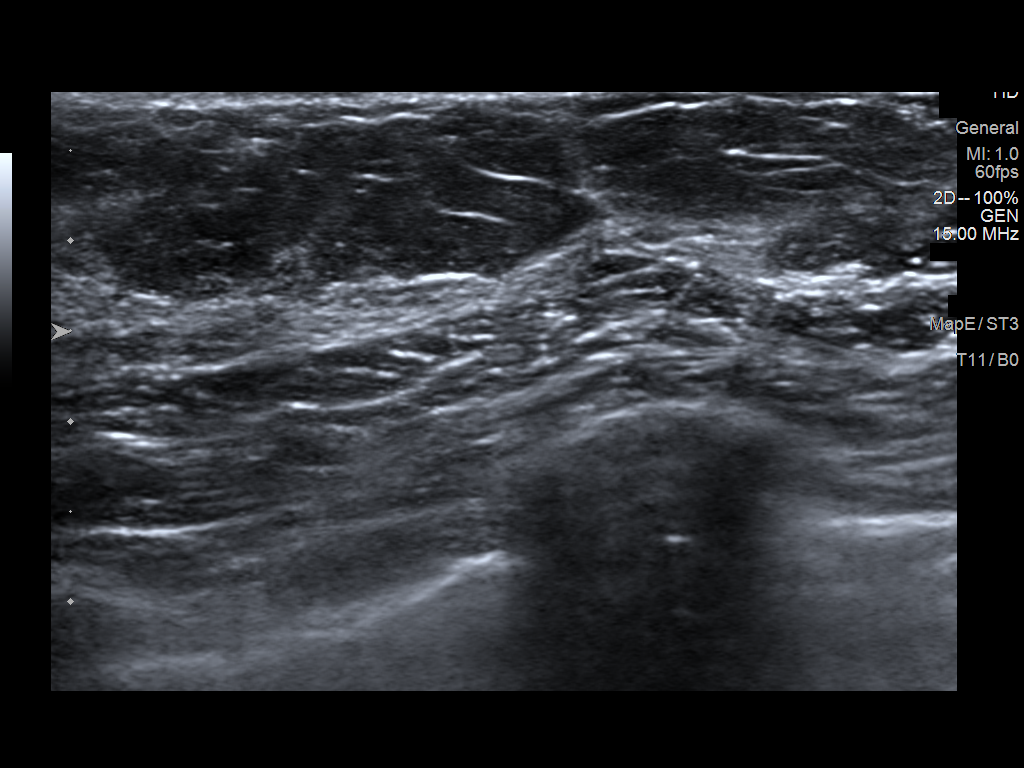
[im 2/2]
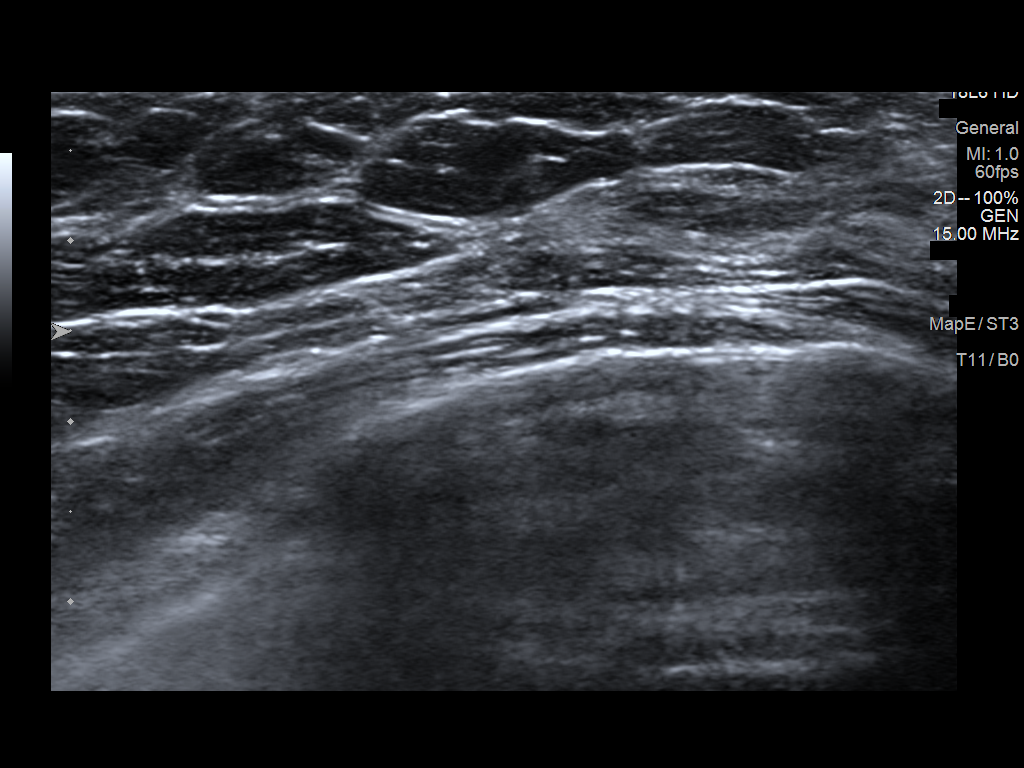

[Series 2: ultrasound right breast limited · 0.06mm/px · 4 of 4 slices shown (2 of 2)]
[im 1/4]
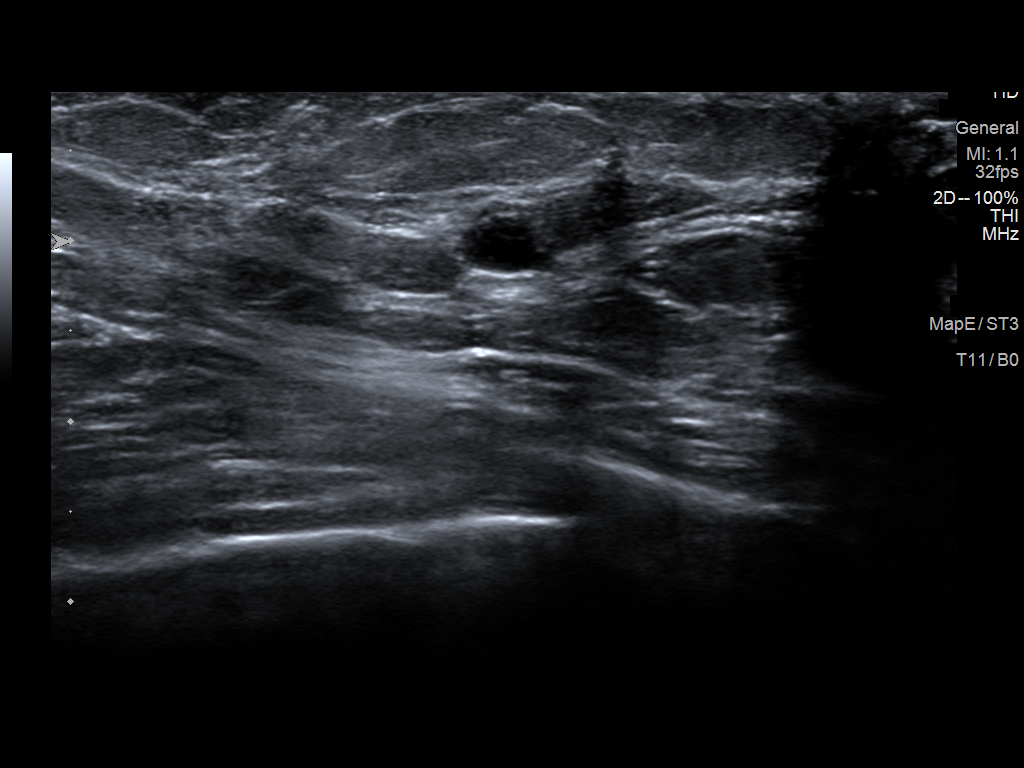
[im 2/4]
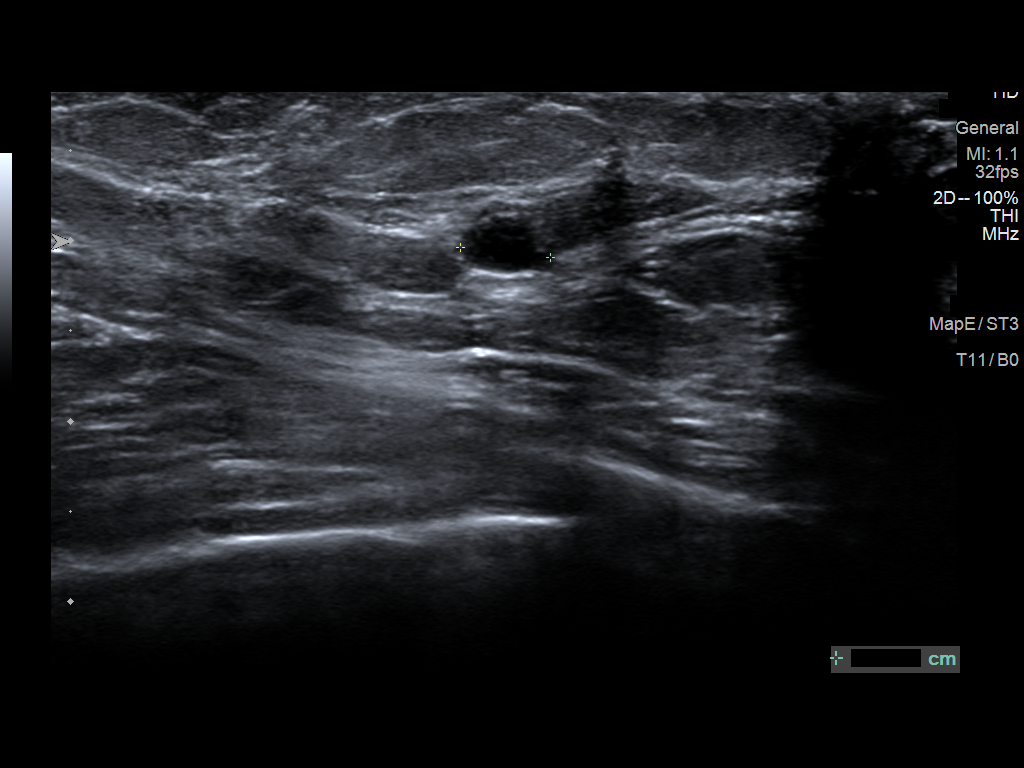
[im 3/4]
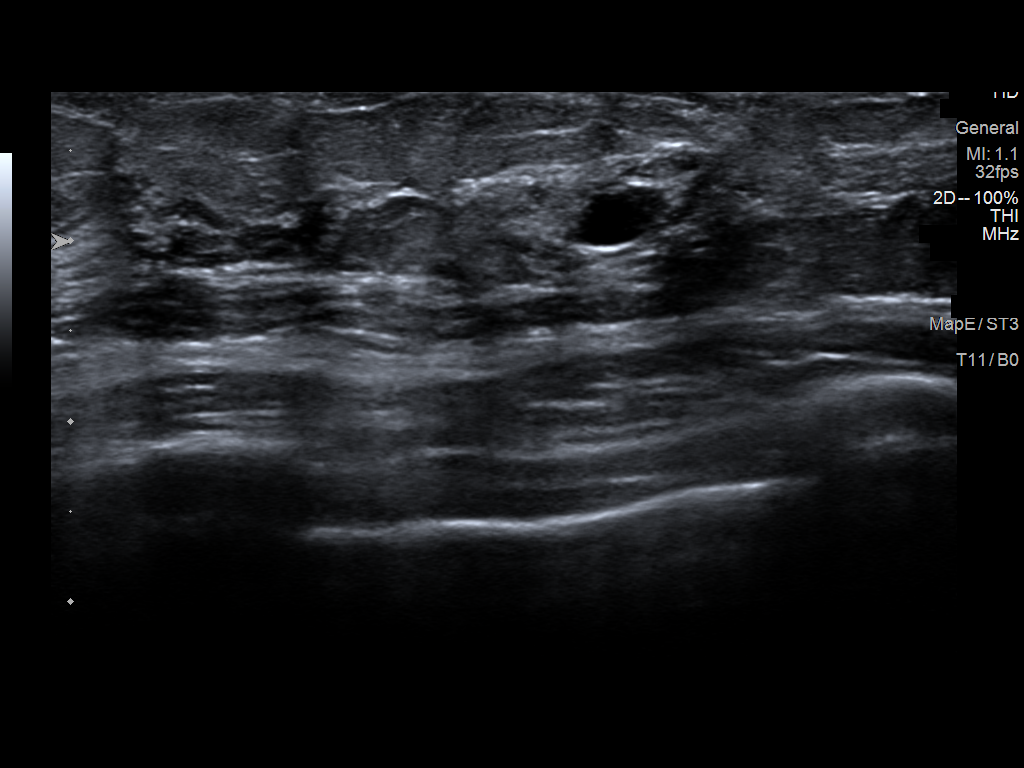
[im 4/4]
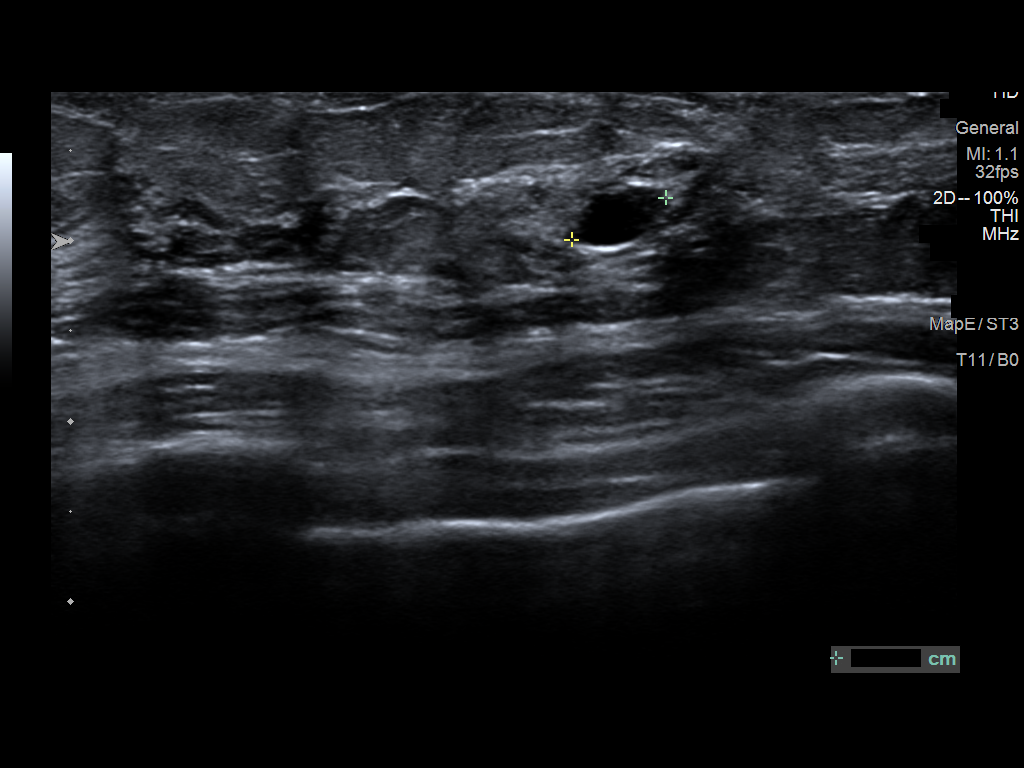

[6 of 6 positions shown; findings below may reference images not displayed]

FINDINGS: On physical exam, no discrete mass is palpable at the locations of
patient concern in both breasts.

Targeted ultrasound is performed, showing a 5 mm cyst in the 9
o'clock position of the right breast, 2 cm from the nipple.

An 8 x 7 x 5 mm cluster of microcysts in the 2:30 o'clock position
of the left breast, 3 cm from the nipple.

A 9 x 8 x 5 mm oval, horizontally oriented, circumscribed,
hypoechoic mass with thin partial internal septations in the 10
o'clock position of the left breast, 5 cm from the nipple.
IMPRESSION: 1. 9 mm probable fibroadenoma in the 10 o'clock position of the left
breast.
2. 8 mm benign cluster of microcysts in the 2:30 o'clock position of
the left breast.
3. 5 mm benign cyst in the 9 o'clock position of the right breast.

RECOMMENDATION:
Follow-up left breast ultrasound in 6 months to re-evaluate the
probable fibroadenoma in the 10 o'clock position of the breast. The
option of ultrasound-guided core needle biopsy was discussed with
the patient but not recommended at this time. She stated that she
would like to have the area biopsied. Therefore this has been
scheduled at [DATE] p.m. on [DATE].

I have discussed the findings and recommendations with the patient.
Results were also provided in writing at the conclusion of the
visit. If applicable, a reminder letter will be sent to the patient
regarding the next appointment.

BI-RADS CATEGORY  3: Probably benign.

## 2018-03-11 DIAGNOSIS — D493 Neoplasm of unspecified behavior of breast: Secondary | ICD-10-CM

## 2018-03-11 HISTORY — DX: Neoplasm of unspecified behavior of breast: D49.3

## 2018-03-13 ENCOUNTER — Ambulatory Visit
Admission: RE | Admit: 2018-03-13 | Discharge: 2018-03-13 | Disposition: A | Discharge status: Home / Self Care | Payer: Medicaid Other | Source: Ambulatory Visit | Attending: Family Medicine | Admitting: Family Medicine

## 2018-03-13 ENCOUNTER — Encounter: Payer: Self-pay | Admitting: Family Medicine

## 2018-03-13 ENCOUNTER — Other Ambulatory Visit (HOSPITAL_COMMUNITY)
Admission: RE | Admit: 2018-03-13 | Discharge: 2018-03-13 | Disposition: A | Discharge status: Home / Self Care | Payer: Medicaid Other | Source: Ambulatory Visit | Attending: Family Medicine | Admitting: Family Medicine

## 2018-03-13 ENCOUNTER — Other Ambulatory Visit: Payer: Self-pay | Admitting: Family Medicine

## 2018-03-13 ENCOUNTER — Ambulatory Visit (INDEPENDENT_AMBULATORY_CARE_PROVIDER_SITE_OTHER): Payer: Medicaid Other | Admitting: Family Medicine

## 2018-03-13 ENCOUNTER — Encounter: Payer: Self-pay | Admitting: Gastroenterology

## 2018-03-13 VITALS — BP 107/70 | HR 81 | Resp 17 | Ht 61.0 in | Wt 169.0 lb

## 2018-03-13 DIAGNOSIS — Z01419 Encounter for gynecological examination (general) (routine) without abnormal findings: Secondary | ICD-10-CM

## 2018-03-13 DIAGNOSIS — Z3202 Encounter for pregnancy test, result negative: Secondary | ICD-10-CM | POA: Diagnosis not present

## 2018-03-13 DIAGNOSIS — Z01411 Encounter for gynecological examination (general) (routine) with abnormal findings: Secondary | ICD-10-CM | POA: Diagnosis not present

## 2018-03-13 DIAGNOSIS — Z1389 Encounter for screening for other disorder: Secondary | ICD-10-CM | POA: Diagnosis not present

## 2018-03-13 DIAGNOSIS — K279 Peptic ulcer, site unspecified, unspecified as acute or chronic, without hemorrhage or perforation: Secondary | ICD-10-CM | POA: Diagnosis not present

## 2018-03-13 DIAGNOSIS — Z0001 Encounter for general adult medical examination with abnormal findings: Secondary | ICD-10-CM

## 2018-03-13 DIAGNOSIS — N6320 Unspecified lump in the left breast, unspecified quadrant: Secondary | ICD-10-CM

## 2018-03-13 DIAGNOSIS — Z0000 Encounter for general adult medical examination without abnormal findings: Secondary | ICD-10-CM

## 2018-03-13 DIAGNOSIS — F909 Attention-deficit hyperactivity disorder, unspecified type: Secondary | ICD-10-CM | POA: Diagnosis not present

## 2018-03-13 DIAGNOSIS — Z131 Encounter for screening for diabetes mellitus: Secondary | ICD-10-CM

## 2018-03-13 DIAGNOSIS — Z23 Encounter for immunization: Secondary | ICD-10-CM

## 2018-03-13 DIAGNOSIS — Z130 Encounter for screening for diseases of the blood and blood-forming organs and certain disorders involving the immune mechanism: Secondary | ICD-10-CM

## 2018-03-13 DIAGNOSIS — F411 Generalized anxiety disorder: Secondary | ICD-10-CM

## 2018-03-13 DIAGNOSIS — Z8659 Personal history of other mental and behavioral disorders: Secondary | ICD-10-CM

## 2018-03-13 DIAGNOSIS — Z13220 Encounter for screening for lipoid disorders: Secondary | ICD-10-CM

## 2018-03-13 LAB — POCT URINE PREGNANCY: Preg Test, Ur: NEGATIVE

## 2018-03-13 LAB — POCT URINALYSIS DIP (CLINITEK)
Bilirubin, UA: NEGATIVE
GLUCOSE UA: NEGATIVE mg/dL
Ketones, POC UA: NEGATIVE mg/dL
Nitrite, UA: NEGATIVE
POC PROTEIN,UA: NEGATIVE
Spec Grav, UA: 1.025 (ref 1.010–1.025)
Urobilinogen, UA: 0.2 E.U./dL
pH, UA: 6.5 (ref 5.0–8.0)

## 2018-03-13 IMAGING — US US BREAST BX W LOC DEV 1ST LESION IMG BX SPEC US GUIDE*L*
1 series · 10 of 10 positions shown · non-contrast
Comparison: Previous exam(s).

Addendum:
CLINICAL DATA: 22-year-old female with a probably benign left
breast mass. The patient would like this area biopsy.

EXAM:
ULTRASOUND GUIDED LEFT BREAST CORE NEEDLE BIOPSY

[Series 1: us breast bx w loc dev 1st lesion img bx spec us g · 0.06mm/px · 10 of 10 slices shown]
[im 1/10]
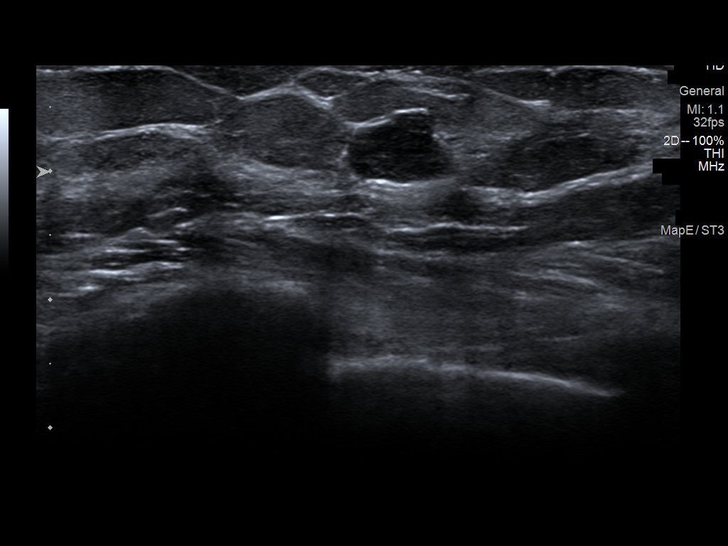
[im 2/10]
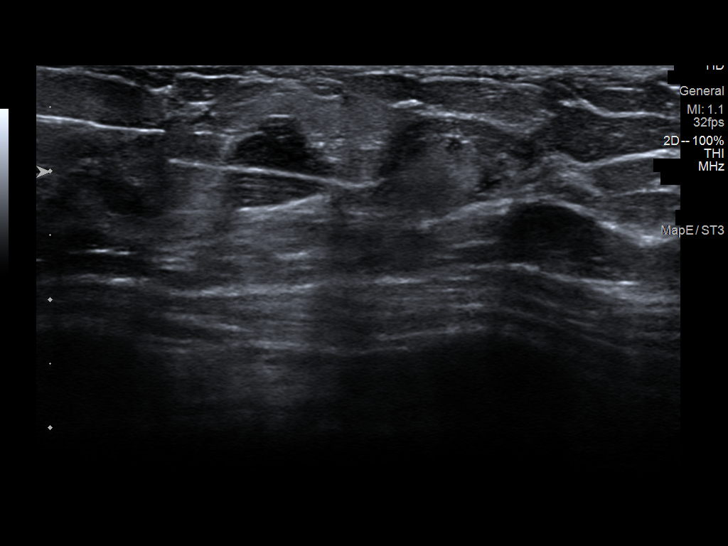
[im 3/10]
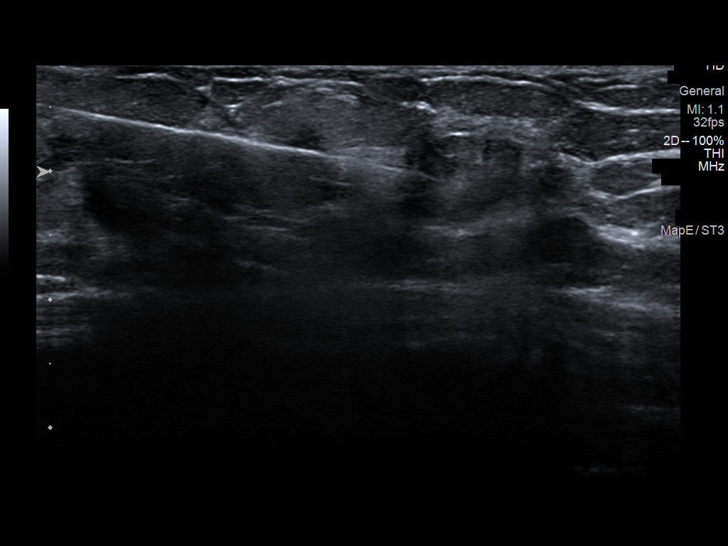
[im 4/10]
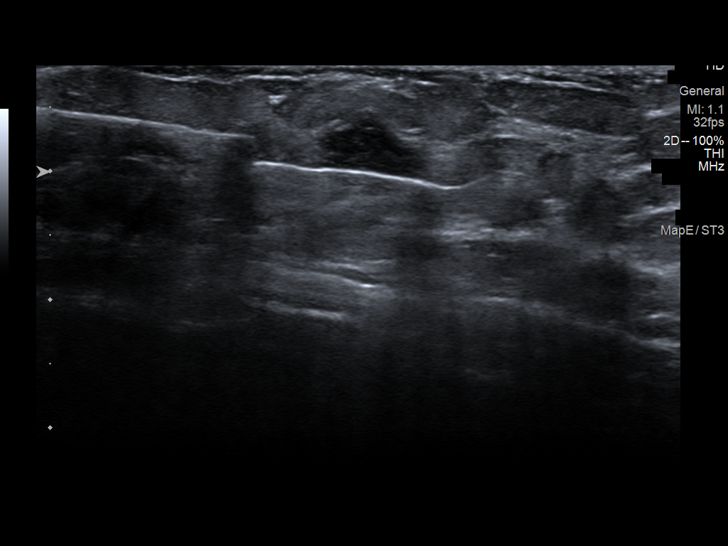
[im 5/10]
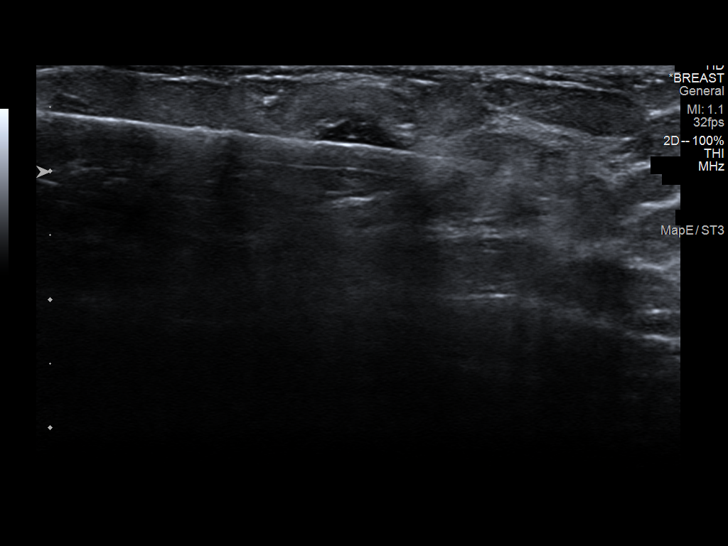
[im 6/10]
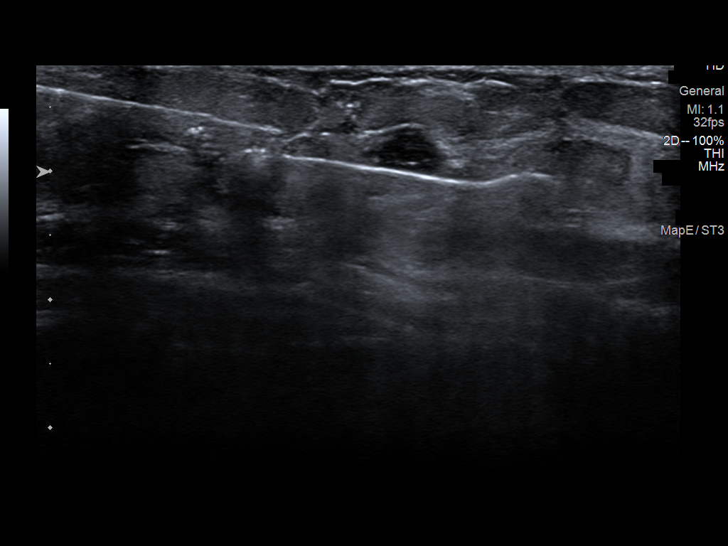
[im 7/10]
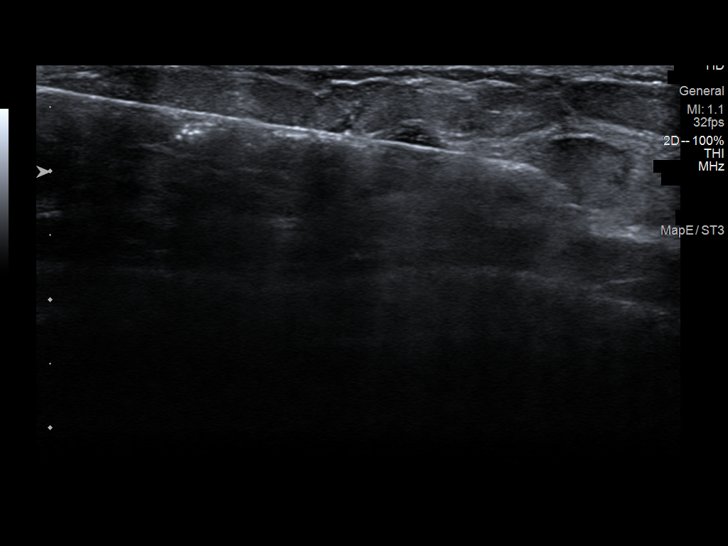
[im 8/10]
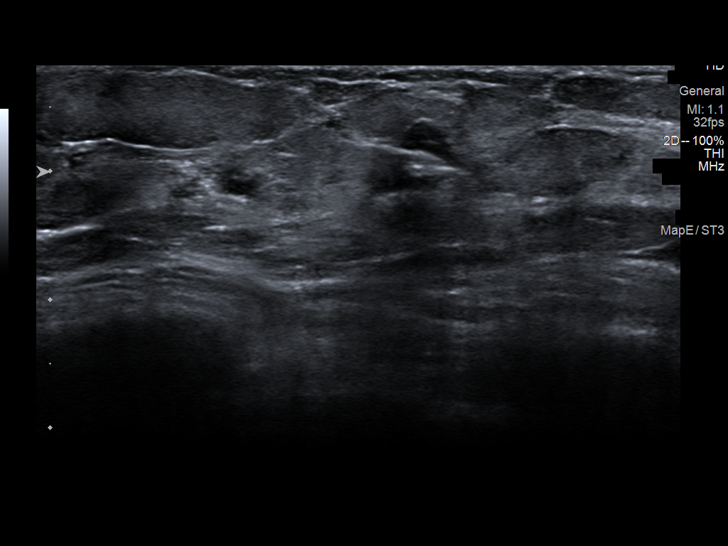
[im 9/10]
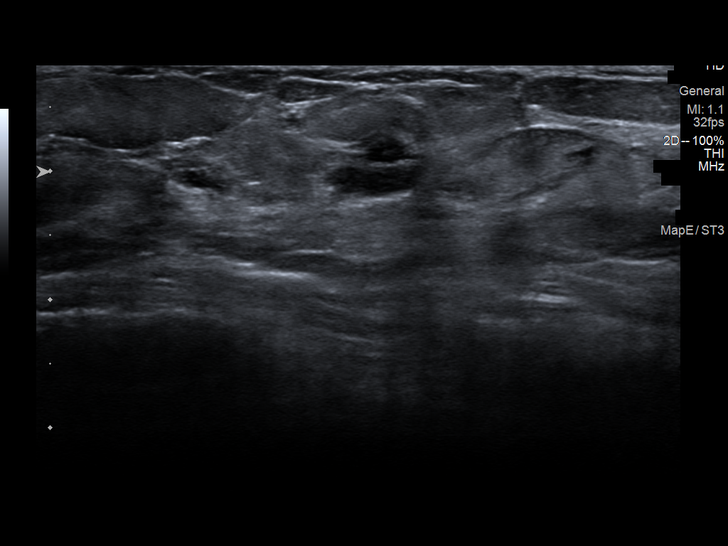
[im 10/10]
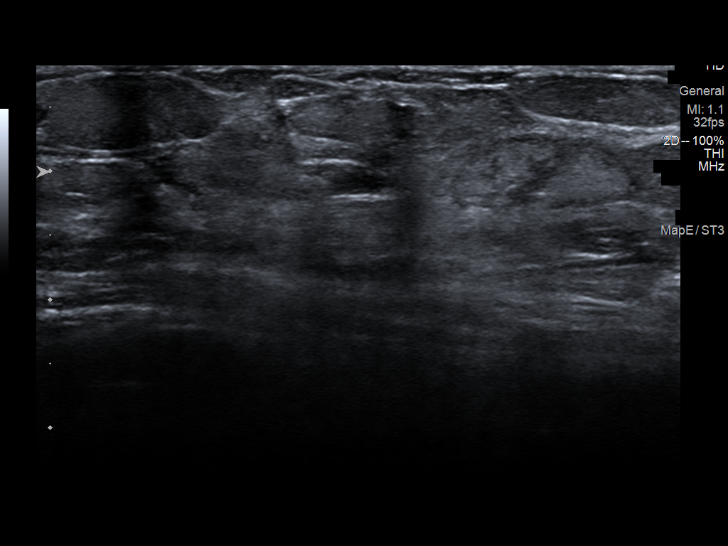

[10 of 10 positions shown; findings below may reference images not displayed]



Lesion quadrant: Upper inner quadrant

Using sterile technique and 1% Lidocaine as local anesthetic, under
direct ultrasound visualization, a 12 gauge MUSNECKIENE device was
used to perform biopsy of a mass at the 10 o'clock position 5 cm
from the nipple using a inferior approach. At the conclusion of the
procedure a HydroMARK tissue marker clip was deployed into the
biopsy cavity.
IMPRESSION: Ultrasound guided biopsy of the left breast. No apparent
complications.

ADDENDUM:
Pathology revealed FIBROADENOMA, LEFT breast, 10 o'clock, 5cm from
nipple.. This was found to be concordant by Dr. MUSNECKIENE.

Pathology results were discussed with the patient by telephone. The
patient reported doing well after the biopsy with tenderness at the
site. Post biopsy instructions and care were reviewed and questions
were answered. The patient was encouraged to call The [REDACTED]

The patient was asked to return for a left breast ultrasound in 6
months and informed a reminder notice would be sent regarding this
appointment.

Pathology results reported by MUSNECKIENE, RN on [DATE].

*** End of Addendum ***

## 2018-03-13 NOTE — Patient Instructions (Addendum)
You will be contacted to schedule appointments with both Gastroenterology and [place].         Living With Bipolar Disorder If you have been diagnosed with bipolar disorder, you may be relieved that you now know why you have felt or behaved a certain way. You may also feel overwhelmed about the treatment ahead, how to get the support you need, and how to deal with the condition day-to-day. With care and support, you can learn to manage your symptoms and live with bipolar disorder. How to manage lifestyle changes Managing stress Stress is your body's reaction to life changes and events, both good and bad. Stress can play a major role in bipolar disorder, so it is important to learn how to cope with stress. Some techniques to cope with stress include:  Meditation, muscle relaxation, and breathing exercises.  Exercise. Even a short daily walk can help to lower stress levels.  Getting enough good-quality sleep. Too little sleep can cause mania to start (can trigger mania).  Making a schedule to manage your time. Knowing your daily schedule can help to keep you from feeling overwhelmed by tasks and deadlines.  Spending time on hobbies that you enjoy.  Medicines Your health care provider may suggest certain medicines if he or she feels that they will help improve your condition. Avoid using caffeine, alcohol, and other substances that may prevent your medicines from working properly (may interact). It is also important to:  Talk with your pharmacist or health care provider about all the medicines that you take, their possible side effects, and which medicines are safe to take together.  Make it your goal to take part in all treatment decisions (shared decision-making). Ask about possible side effects of medicines that your health care provider recommends, and tell him or her how you feel about having those side effects. It is best if shared decision-making with your health  care provider is part of your total treatment plan. If you are taking medicines as part of your treatment, do not stop taking medicines before you ask your health care provider if it is safe to stop. You may need to have the medicine slowly decreased (tapered) over time to decrease the risk of harmful side effects. Relationships Spend time with people that you trust and with whom you feel a sense of understanding and calm. Try to find friends or family members who make you feel safe and can help you control feelings of mania. Consider going to couples counseling, family education classes, or family therapy to:  Educate your loved ones about your condition and offer suggestions about how they can support you.  Help resolve conflicts.  Help develop communication skills in your relationships.  How to recognize changes in your condition Everyone responds differently to treatment for bipolar disorder. Some signs that your condition is improving include:  Leveling of your mood. You may have less anger and excitement about daily activities, and your low moods may not be as bad.  Your symptoms being less intense.  Feeling calm more often.  Thinking clearly.  Not experiencing consequences for extreme behavior.  Feeling like your life is settling down.  Your behavior seeming more normal to you and to other people. Some signs that your condition may be getting worse include:  Sleep problems.  Moods cycling between deep lows and unusually high (excess) energy.  Extreme emotions.  More anger at loved ones.  Staying away from others (isolating yourself).  A feeling of power or  superiority.  Completing a lot of tasks in a very short amount of time.  Unusual thoughts and behaviors.  Suicidal thoughts. Where to find support Talking to others  Try making a list of the people you may want to tell about your condition, such as the people you trust most.  Plan what you are willing to  talk about and what you do not want to discuss. Think about your needs ahead of time, and how your friends and family members can support you.  Let your loved ones know when they can share advice and when you would just like them to listen.  Give your loved ones information about bipolar disorder, and encourage them to learn about the condition. Finances Not all insurance plans cover mental health care, so it is important to check with your insurance carrier. If paying for co-pays or counseling services is a problem, search for a local or county mental health care center. Public mental health care services may be offered there at a low cost or no cost when you are not able to see a private health care provider. If you are taking medicine for depression, you may be able to get the generic form, which may be less expensive than brand-name medicine. Some makers of prescription medicines also offer help to patients who cannot afford the medicines they need. Follow these instructions at home: Medicines  Take over-the-counter and prescription medicines only as told by your health care provider or pharmacist.  Ask your pharmacist what over-the-counter cold medicines you should avoid. Some medicines can make symptoms worse. General instructions  Ask for support from trusted family members or friends to make sure you stay on track with your treatment.  Keep a journal to write down your daily moods, medicines, sleep habits, and life events. This may help you have more success with your treatment.  Make and follow a routine for daily meal times. Eat healthy foods, such as whole grains, vegetables, and fresh fruit.  Try to go to sleep and wake up around the same time every day.  Keep all follow-up visits as told by your health care provider. This is important. Questions to ask your health care provider:  If you are taking medicines: ? How long do I need to take medicine? ? Are there any long-term  side effects of my medicine? ? Are there any alternatives to taking medicine?  How would I benefit from therapy?  How often should I follow up with a health care provider? Contact a health care provider if:  Your symptoms get worse or they do not get better with treatment. Get help right away if:  You have thoughts about harming yourself or others. If you ever feel like you may hurt yourself or others, or have thoughts about taking your own life, get help right away. You can go to your nearest emergency department or call:  Your local emergency services (911 in the [place]  A suicide crisis helpline, such as the [facility] at (929)046-2475. This is open 24-hours a day. Summary  Learning ways to deal with stress can help to calm you and may also help your treatment work better.  There is a wide range of medicines that can help to treat bipolar disorder.  Having healthy relationships can help to make your moods more stable.  Contact a health care provider if your symptoms get worse or they do not get better with treatment. This information is not intended to replace advice  given to you by your health care provider. Make sure you discuss any questions you have with your health care provider. Document Released: 05/27/2016 Document Revised: 05/27/2016 Document Reviewed: 05/27/2016 Elsevier Interactive Patient Education  2019 [place] for [place], Female The transition to life after high school as a young adult can be a stressful time with many changes. You may start seeing a primary care physician instead of a pediatrician. This is the time when your health care becomes your responsibility. Preventive care refers to lifestyle choices and visits with your health care provider that can promote health and wellness. What does preventive care include?  A yearly physical exam. This is also called an annual wellness visit.  Dental exams  once or twice a year.  Routine eye exams. Ask your health care provider how often you should have your eyes checked.  Personal lifestyle choices, including: ? Daily care of your teeth and gums. ? Regular physical activity. ? Eating a healthy diet. ? Avoiding tobacco and drug use. ? Avoiding or limiting alcohol use. ? Practicing safe sex. ? Taking vitamin and mineral supplements as recommended by your health care provider. What happens during an annual wellness visit? Preventive care starts with a yearly visit to your primary care physician. The services and screenings done by your health care provider during your annual wellness visit will depend on your overall health, lifestyle risk factors, and family history of disease. Counseling Your health care provider may ask you questions about:  Past medical problems and your family's medical history.  Medicines or supplements you take.  Health insurance and access to health care.  Alcohol, tobacco, and drug use.  Your safety at home, work, or school.  Access to firearms.  Emotional well-being and how you cope with stress.  Relationship well-being.  Diet, exercise, and sleep habits.  Your sexual health and activity.  Your methods of birth control.  Your menstrual cycle.  Your pregnancy history. Screening You may have the following tests or measurements:  Height, weight, and BMI.  Blood pressure.  Lipid and cholesterol levels.  Tuberculosis skin test.  Skin exam.  Vision and hearing tests.  Screening test for hepatitis.  Screening tests for sexually transmitted diseases (STDs), if you are at risk.  BRCA-related cancer screening. This may be done if you have a family history of breast, ovarian, tubal, or peritoneal cancers.  Pelvic exam and Pap test. This may be done every 3 years starting at age 61. Vaccines Your health care provider may recommend certain vaccines, such as:  Influenza vaccine. This is  recommended every year.  Tetanus, diphtheria, and acellular pertussis (Tdap, Td) vaccine. You may need a Td booster every 10 years.  Varicella vaccine. You may need this if you have not been vaccinated.  HPV vaccine. If you are 77 or younger, you may need three doses over 6 months.  Measles, mumps, and rubella (MMR) vaccine. You may need at least one dose of MMR. You may also need a second dose.  Pneumococcal 13-valent conjugate (PCV13) vaccine. You may need this if you have certain conditions and were not previously vaccinated.  Pneumococcal polysaccharide (PPSV23) vaccine. You may need one or two doses if you smoke cigarettes or if you have certain conditions.  Meningococcal vaccine. One dose is recommended if you are age 43-21 years and a first-year college student living in a residence hall, or if you have one of several medical conditions. You may also need additional booster doses.  Hepatitis A vaccine. You may need this if you have certain conditions or if you travel or work in places where you may be exposed to hepatitis A.  Hepatitis B vaccine. You may need this if you have certain conditions or if you travel or work in places where you may be exposed to hepatitis B.  Haemophilus influenzae type b (Hib) vaccine. You may need this if you have certain risk factors. Talk to your health care provider about which screenings and vaccines you need and how often you need them. What steps can I take to develop healthy behaviors?      Have regular preventive health care visits with your primary care physician and dentist.  Eat a healthy diet.  Drink enough fluid to keep your urine pale yellow.  Stay active. Exercise at least 30 minutes 5 or more days of the week.  Use alcohol responsibly.  Maintain a healthy weight.  Do not use any products that contain nicotine, such as cigarettes, chewing tobacco, and e-cigarettes. If you need help quitting, ask your health care  provider.  Do not use drugs.  Practice safe sex.  Use birth control (contraception) to prevent unwanted pregnancy. If you plan to become pregnant, see your health care provider for a pre-conception visit.  Find healthy ways to manage stress. How can I protect myself from injury? Injuries from violence or accidents are the leading cause of death among young adults and can often be prevented. Take these steps to help protect yourself:  Always wear your seat belt while driving or riding in a vehicle.  Do not drive if you have been drinking alcohol. Do not ride with someone who has been drinking.  Do not drive when you are tired or distracted. Do not text while driving.  Wear a helmet and other protective equipment during sports activities.  If you have firearms in your house, make sure you follow all gun safety procedures.  Seek help if you have been bullied, physically abused, or sexually abused.  Use the Internet responsibly to avoid dangers such as online bullying and online sexual predators. What can I do to cope with stress? Young adults may face many new challenges that can be stressful, such as finding a job, going to college, moving away from home, managing money, being in a relationship, getting married, and having children. To manage stress:  Avoid known stressful situations when you can.  Exercise regularly.  Find a stress-reducing activity that works best for you. Examples include meditation, yoga, listening to music, or reading.  Spend time in nature.  Keep a journal to write about your stress and how you respond.  Talk to your health care provider about stress. He or she may suggest counseling.  Spend time with supportive friends or family.  Do not cope with stress by: ? Drinking alcohol or using drugs. ? Smoking cigarettes. ? Eating. Where can I get more information? Learn more about preventive care and healthy habits from:  [place] and Gynecologists: KaraokeExchange.nl  U.S. Probation officer Task Force: StageSync.si  National Adolescent and [place]: StrategicRoad.nl  American Academy of Pediatrics Bright Futures: https://brightfutures.MemberVerification.co.za  Society for Adolescent Health and Medicine: MoralBlog.co.za.aspx  PodExchange.nl: ToyLending.fr This information is not intended to replace advice given to you by your health care provider. Make sure you discuss any questions you have with your health care provider. Document Released: 06/12/2015 Document Revised: 09/07/2016 Document Reviewed: 06/12/2015 Elsevier Interactive Patient Education  2019  [employer].

## 2018-03-13 NOTE — Progress Notes (Signed)
Established Patient Office Visit  Subjective:  Patient ID: Kellie Martinez, female    DOB: 1996-01-11  Age: 23 y.o. MRN: [id number]  CC:  Chief Complaint  Patient presents with  . Gynecologic Exam    HPI [name] presents for PAP only visit.  History of tubal ligation. Reports no known history of abnormal PAP. Reports grandmother had ovary removed and she thinks likely related to cancer. She recently was evaluated for abnormal breast masses and is scheduled to undergo a breast biopsy today of left breat mass.  She is fasting today and will have routine screening labs completed. She requests influenza vaccines. Reports current on TDAP.   Referrals:  Patient is requesting referrals to psychiatry for evaluation and medication management of previously diagnosed Bipolar disorder and ADHD. She has a PHQ-9 score 16. Previously prescribed medications although has been off for sometime. History of substance abuse currently in remission.   Patient requesting a referral gastroenterology for evaluation of peptic ulcer disease.  Reports diagnosed last year with PUD. She has been infected with H pylori x 2 instances. Most recently treated for H pylori in December 2019. No prior EGD.    Past Medical History:  Diagnosis Date  . Anemia   . Anxiety   . Asthma   . Chronic kidney disease   . GERD (gastroesophageal reflux disease)   . Renal calculus or stone    had stent/ lithotripsy in past  . Sleep apnea   . Substance abuse [facility])     Past Surgical History:  Procedure Laterality Date  . CYSTOSCOPY     x2 with stone extraction  . EXTRACORPOREAL SHOCK WAVE LITHOTRIPSY Right 06/20/2013   Procedure: EXTRACORPOREAL SHOCK WAVE LITHOTRIPSY (ESWL);  Surgeon: Edwin Dada, MD;  Location: [facility];  Service: Urology;  Laterality: Right;  . LITHOTRIPSY    . TUBAL LIGATION  2017    Family History  Problem Relation Age of Onset  . Depression Mother   . Asthma Mother   . Hypertension Mother   .  Heart disease Father   . Kidney disease Father   . Breast cancer Paternal Aunt   . Breast cancer Maternal Grandmother   . Breast cancer Cousin     Social History   Socioeconomic History  . Marital status: Single    Spouse name: Not on file  . Number of children: Not on file  . Years of education: Not on file  . Highest education level: Not on file  Occupational History  . Not on file  Social Needs  . Financial resource strain: Not on file  . Food insecurity:    Worry: Not on file    Inability: Not on file  . Transportation needs:    Medical: Not on file    Non-medical: Not on file  Tobacco Use  . Smoking status: Current Some Day Smoker    Packs/day: 0.25    Types: Cigarettes  . Smokeless tobacco: Never Used  Substance and Sexual Activity  . Alcohol use: Yes    Comment: occ  . Drug use: No  . Sexual activity: Yes    Birth control/protection: I.U.D.  Lifestyle  . Physical activity:    Days per week: Not on file    Minutes per session: Not on file  . Stress: Not on file  Relationships  . Social connections:    Talks on phone: Not on file    Gets together: Not on file    Attends religious service: Not  on file    Active member of club or organization: Not on file    Attends meetings of clubs or organizations: Not on file    Relationship status: Not on file  . Intimate partner violence:    Fear of current or ex partner: Not on file    Emotionally abused: Not on file    Physically abused: Not on file    Forced sexual activity: Not on file  Other Topics Concern  . Not on file  Social History Narrative  . Not on file    No outpatient medications prior to visit.   No facility-administered medications prior to visit.     Allergies  Allergen Reactions  . Penicillins Hives    No anaphylaxis  . Phenergan [Promethazine Hcl]     ROS Review of Systems Pertinent negatives listed in HPI   Objective:    Physical Exam BP 107/70   Pulse 81   Resp 17   Ht 5'  1" (1.549 m)   Wt 169 lb (76.7 kg)   LMP 02/15/2018   SpO2 98%   BMI 31.93 kg/m    Constitutional: Patient appears well-developed and well-nourished. No distress. HENT: Normocephalic, atraumatic, External right and left ear normal.  Eyes: Conjunctivae and EOM are normal. PERRLA, no scleral icterus. Neck: Normal ROM. Neck supple. No JVD. No tracheal deviation. No thyromegaly. CVS: RRR, S1/S2 +, no murmurs, no gallops, no carotid bruit.  Pulmonary: Effort and breath sounds normal, no stridor, rhonchi, wheezes, rales.  Abdominal: Soft. BS +, no distension, tenderness, rebound or guarding.  Female Exam: Normal female external genitalia without lesion. No inguinal lymphadenopathy. Vaginal mucosa is pink and moist without lesions. Cervix is closed without discharge, not friable. Pap smear obtained. No cervical motion tenderness, adnexal fullness or tenderness. Musculoskeletal: Normal range of motion. No edema and no tenderness.  Neuro: Alert. Normal reflexes, muscle tone coordination. No cranial nerve deficit. Skin: Skin is warm and dry. No rash noted. Not diaphoretic. No erythema. No pallor. Psychiatric: Normal mood and affect. Behavior, judgment, thought content normal.  Wt Readings from Last 3 Encounters:  03/13/18 169 lb (76.7 kg)  02/20/18 171 lb 12.8 oz (77.9 kg)  09/05/17 165 lb 6.4 oz (75 kg)     Health Maintenance Due  Topic Date Due  . PAP-Cervical Cytology Screening  07/20/2014  . PAP SMEAR-Modifier  07/20/2014  . INFLUENZA VACCINE  09/08/2017    There are no preventive care reminders to display for this patient.  No results found for: TSH Lab Results  Component Value Date   WBC 3.9 09/05/2017   HGB 12.3 09/05/2017   HCT 37.3 09/05/2017   MCV 89 09/05/2017   PLT 185 09/05/2017   Lab Results  Component Value Date   NA 142 09/05/2017   K 3.9 09/05/2017   CO2 23 09/05/2017   GLUCOSE 83 09/05/2017   BUN 17 09/05/2017   CREATININE 0.82 09/05/2017   BILITOT 0.6  09/05/2017   ALKPHOS 65 09/05/2017   AST 14 09/05/2017   ALT 12 09/05/2017   PROT 6.8 09/05/2017   ALBUMIN 4.4 09/05/2017   CALCIUM 9.0 09/05/2017   ANIONGAP 9 04/26/2017    Lab Results  Component Value Date   HGBA1C 4.7 09/05/2017     Assessment & Plan:  1. Needs flu shot - Flu Vaccine QUAD 6+ mos PF IM (Fluarix Quad PF)  2. Encounter for gynecological examination - Cytology - PAP([place]) - HIV antibody (with reflex) - RPR - POCT  urine pregnancy  3. Annual physical exam -Age appropriate anticipatory guidance   4. Lipid screening - Lipid panel  5. Screening for diabetes mellitus - Comprehensive metabolic panel - Hemoglobin A1c  7. Screening for deficiency anemia - CBC with Differential  8. Hx of bipolar disorder 9. Attention deficit hyperactivity disorder (ADHD), unspecified ADHD type 10. Generalized anxiety disorder Checking  TSH Referral placed to:  - Ambulatory referral to Psychiatry  11. PUD (peptic ulcer disease) - Ambulatory referral to Gastroenterology  12. Screening for blood or protein in urine - POCT URINALYSIS DIP (CLINITEK)   Follow-up: Return for as needed.    Molli Barrows, FNP

## 2018-03-14 LAB — COMPREHENSIVE METABOLIC PANEL
ALT: 13 IU/L (ref 0–32)
AST: 15 IU/L (ref 0–40)
Albumin/Globulin Ratio: 2.1 (ref 1.2–2.2)
Albumin: 4.6 g/dL (ref 3.9–5.0)
Alkaline Phosphatase: 60 IU/L (ref 39–117)
BILIRUBIN TOTAL: 0.4 mg/dL (ref 0.0–1.2)
BUN/Creatinine Ratio: 17 (ref 9–23)
BUN: 11 mg/dL (ref 6–20)
CALCIUM: 9.6 mg/dL (ref 8.7–10.2)
CO2: 22 mmol/L (ref 20–29)
Chloride: 103 mmol/L (ref 96–106)
Creatinine, Ser: 0.64 mg/dL (ref 0.57–1.00)
GFR calc non Af Amer: 125 mL/min/{1.73_m2} (ref >59)
GFR, EST AFRICAN AMERICAN: 144 mL/min/{1.73_m2} (ref >59)
GLUCOSE: 87 mg/dL (ref 65–99)
Globulin, Total: 2.2 g/dL (ref 1.5–4.5)
Potassium: 4.3 mmol/L (ref 3.5–5.2)
Sodium: 139 mmol/L (ref 134–144)
TOTAL PROTEIN: 6.8 g/dL (ref 6.0–8.5)

## 2018-03-14 LAB — CBC WITH DIFFERENTIAL/PLATELET
BASOS ABS: 0 10*3/uL (ref 0.0–0.2)
Basos: 0 %
EOS (ABSOLUTE): 0 10*3/uL (ref 0.0–0.4)
EOS: 1 %
HEMATOCRIT: 38.3 % (ref 34.0–46.6)
Hemoglobin: 12.6 g/dL (ref 11.1–15.9)
IMMATURE GRANULOCYTES: 0 %
Immature Grans (Abs): 0 10*3/uL (ref 0.0–0.1)
LYMPHS ABS: 1.1 10*3/uL (ref 0.7–3.1)
Lymphs: 19 %
MCH: 30.4 pg (ref 26.6–33.0)
MCHC: 32.9 g/dL (ref 31.5–35.7)
MCV: 93 fL (ref 79–97)
Monocytes Absolute: 0.6 10*3/uL (ref 0.1–0.9)
Monocytes: 10 %
NEUTROS PCT: 70 %
Neutrophils Absolute: 3.9 10*3/uL (ref 1.4–7.0)
PLATELETS: 170 10*3/uL (ref 150–450)
RBC: 4.14 x10E6/uL (ref 3.77–5.28)
RDW: 13 % (ref 11.7–15.4)
WBC: 5.7 10*3/uL (ref 3.4–10.8)

## 2018-03-14 LAB — HEMOGLOBIN A1C
ESTIMATED AVERAGE GLUCOSE: 97 mg/dL
Hgb A1c MFr Bld: 5 % (ref 4.8–5.6)

## 2018-03-14 LAB — LIPID PANEL
CHOL/HDL RATIO: 3.1 ratio (ref 0.0–4.4)
Cholesterol, Total: 108 mg/dL (ref 100–199)
HDL: 35 mg/dL — ABNORMAL LOW (ref >39)
LDL CALC: 64 mg/dL (ref 0–99)
Triglycerides: 45 mg/dL (ref 0–149)
VLDL Cholesterol Cal: 9 mg/dL (ref 5–40)

## 2018-03-14 LAB — TSH: TSH: 1.17 u[IU]/mL (ref 0.450–4.500)

## 2018-03-14 LAB — RPR: RPR: NONREACTIVE

## 2018-03-14 LAB — HIV ANTIBODY (ROUTINE TESTING W REFLEX): HIV SCREEN 4TH GENERATION: NONREACTIVE

## 2018-03-15 ENCOUNTER — Telehealth: Payer: Self-pay

## 2018-03-15 NOTE — Telephone Encounter (Signed)
Patient called in & states that she does not want to be called with her lab results. She has made an appointment on 04/10/2018 & would like to go over everything then.

## 2018-03-16 LAB — CYTOLOGY - PAP
BACTERIAL VAGINITIS: NEGATIVE
Candida vaginitis: POSITIVE — AB
Chlamydia: NEGATIVE
DIAGNOSIS: NEGATIVE
Neisseria Gonorrhea: NEGATIVE

## 2018-03-20 ENCOUNTER — Telehealth: Payer: Self-pay | Admitting: Family Medicine

## 2018-03-20 MED ORDER — FLUCONAZOLE 150 MG PO TABS: 150.0000 mg | ORAL_TABLET | Freq: Once | ORAL | 0 refills | Status: AC

## 2018-03-20 NOTE — Telephone Encounter (Addendum)
Recent labs show the following Normal kidney, normal liver, normal electrolyte functioning. Thyroid functioning was also normal.  Cholesterol panel showed a slightly low healthy cholesterol.Presently, I recommend lifestyle changes such as engaging in routine physical activity with a goal of 150 minutes per week, increasing intake of vegetables, fruits, fiber, and selecting lean cuts of meat. Diabetes screening normal.  Pap was negative of any abnormal cells however revealed the presence of yeast.  I have prescribed Diflucan 150 mg 1 tablet for her to take now and a second tablet for her to take in 3 days if symptoms have not resolved.  Pap was negative for STD

## 2018-03-20 NOTE — Telephone Encounter (Signed)
Patient called stating that she would not be able to come in for her appointment and would like for someone to call her with lab results.

## 2018-03-20 NOTE — Addendum Note (Signed)
Addended by: Scot Jun on: 03/20/2018 03:05 PM   Modules accepted: Orders

## 2018-03-21 NOTE — Telephone Encounter (Signed)
Spoke with pt and updated on lab results

## 2018-03-28 ENCOUNTER — Ambulatory Visit: Payer: Medicaid Other | Admitting: Family Medicine

## 2018-04-03 ENCOUNTER — Ambulatory Visit: Payer: Medicaid Other | Admitting: Gastroenterology

## 2018-04-10 ENCOUNTER — Ambulatory Visit: Payer: Medicaid Other | Admitting: Family Medicine

## 2018-04-27 ENCOUNTER — Telehealth: Payer: Self-pay | Admitting: Family Medicine

## 2018-04-27 NOTE — Telephone Encounter (Signed)
Pt encourage to make an OV for urine pregnancy test She states she woke up this morning with pain in her abdomen, nausea and 1 emesis episode. She states her tubes are tied. She had a short menstrual cycle last 3 days and normally lasting 7 days.

## 2018-04-27 NOTE — Telephone Encounter (Signed)
Patient called requesting to speak to a nurse because she wanted to know if it was possible for a pregnancy to occur in her tubes.   Call was transferred to [facility].

## 2018-05-01 NOTE — Telephone Encounter (Signed)
Patient notified of the provider's advice. Expressed understanding.

## 2018-05-01 NOTE — Telephone Encounter (Signed)
Notify patient that her appointment is being reschedule due to Coronavirus and need to minimize non-emergent appointments. I recommend she takes a home pregnancy if positive given that her tubes are tied, she should to the ER for emergent evaluation. If she hasn't missed her period, a pregnancy test will result as negative given it would be to early to test.

## 2018-05-05 ENCOUNTER — Ambulatory Visit: Payer: Medicaid Other | Admitting: Family Medicine

## 2018-05-08 ENCOUNTER — Telehealth: Payer: Self-pay

## 2018-05-08 NOTE — Telephone Encounter (Signed)
Lm for pt to call back about appt scheduled for Tuesday am 3-31. Need to get her email address and send link to Webex visit.  Also would like to see if we can move her to this afternoon, 3-30.

## 2018-05-09 ENCOUNTER — Ambulatory Visit: Payer: Medicaid Other | Admitting: Gastroenterology

## 2018-06-12 ENCOUNTER — Ambulatory Visit: Payer: Medicaid Other | Admitting: Family Medicine

## 2018-06-12 NOTE — Progress Notes (Signed)
Prescreened pt for 5-5 appt.  Pt reports she was at [facility] in Dec 2019 for abdominal pain and was diagnosed and treated for H pylori. Requested records from [facility] Fax: [phone] phone: [phone]

## 2018-06-13 ENCOUNTER — Encounter: Payer: Self-pay | Admitting: Gastroenterology

## 2018-06-13 ENCOUNTER — Ambulatory Visit (INDEPENDENT_AMBULATORY_CARE_PROVIDER_SITE_OTHER): Payer: Medicaid Other | Admitting: Gastroenterology

## 2018-06-13 ENCOUNTER — Other Ambulatory Visit: Payer: Self-pay

## 2018-06-13 VITALS — Ht 61.0 in | Wt 182.0 lb

## 2018-06-13 DIAGNOSIS — R1013 Epigastric pain: Secondary | ICD-10-CM | POA: Diagnosis not present

## 2018-06-13 DIAGNOSIS — K219 Gastro-esophageal reflux disease without esophagitis: Secondary | ICD-10-CM | POA: Diagnosis not present

## 2018-06-13 DIAGNOSIS — A048 Other specified bacterial intestinal infections: Secondary | ICD-10-CM | POA: Diagnosis not present

## 2018-06-13 NOTE — Patient Instructions (Signed)
If you are age 25 or older, your body mass index should be between 23-30. Your Body mass index is 34.39 kg/m. If this is out of the aforementioned range listed, please consider follow up with your Primary Care Provider.  If you are age 29 or younger, your body mass index should be between 19-25. Your Body mass index is 34.39 kg/m. If this is out of the aformentioned range listed, please consider follow up with your Primary Care Provider.   Please go to the lab in the basement of our building to have lab work done. Hit "B" for basement when you get on the elevator.  When the doors open the lab is on your left.  We will call you with the results. Thank you.  Thank you for entrusting me with your care and for choosing [facility], Dr. [place] Cellar

## 2018-06-13 NOTE — Progress Notes (Signed)
Virtual Visit via Video Note  I connected with Kellie Martinez on 06/13/18 at  9:30 AM EDT by a video enabled telemedicine application and verified that I am speaking with the correct person using two identifiers.  I discussed the limitations of evaluation and management by telemedicine and the availability of in person appointments. The patient expressed understanding and agreed to proceed.  THIS ENCOUNTER IS A VIRTUAL VISIT DUE TO COVID-19 - PATIENT WAS NOT SEEN IN THE OFFICE. PATIENT HAS CONSENTED TO VIRTUAL VISIT / TELEMEDICINE VISIT VIA DOXIMITY APP   Location of patient: home Location of provider: office Name of referring provider: Molli Barrows FNP Persons participating: myself, patient  HPI :  23 y/o female with a history of bipolar and ADHD, H pylori, referred for a new patient visit for epigastric pain by Molli Barrows FNP.  She thinks since December she has had symptoms bothering her, was told she may have an ulcer in her stomach. Discomfort is centered in her epigastric area, comes and goes. Feels it occasionally. Lasts an hour or so at a time. Eating does can worsen her symptoms but it also bothers her when she is not eating. Tough to clarify for her how much this is bothering her. No nausea or vomiting. She does have some heartburn which bothers her frequently. She has some very mild rare dysphagia, nothing significant. No triggers or relieving factors to her pain. She denies any NSAID use.   She has some mild constipation, otherwise no new bowel changes. No blood in the stools. She was treated for H pylori based on a positive serology in [facility] ED with regimen of clarithromycin, flagyl, protonix for 2 weeks. She thinks she did feel better after treatment but it did not resolve her symptoms. She is not taking any antacids right now. Antacids have helped in the past for her reflux. She is not losing any weight.   H pylori test positive on 01/26/18 She has allergies  listed to PCN and levaquin. She reports hives to PCN.  Other labs from December during time of evaluation show Hgb 14.4, WBC 7.3, MCV 91, LFTs normal, lipase 23. Repeat CBC and CMET in February was normal.  Father had some type of cancer, she thinks father passed at age 67s, she is not sure what type. Grandmother had gastric cancer.    Past Medical History:  Diagnosis Date  . Anemia   . Anxiety   . Asthma   . Bipolar 1 disorder ([place])   . 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  . Chronic kidney disease   . Depression   . GERD (gastroesophageal reflux disease)   . Molar pregnancy   . Renal calculus or stone    had stent/ lithotripsy in past  . Sleep apnea   . Substance abuse ([place])   . Suicide attempt ([place])    x 2     Past Surgical History:  Procedure Laterality Date  . CYSTOSCOPY     x2 with stone extraction  . EXTRACORPOREAL SHOCK WAVE LITHOTRIPSY Right 06/20/2013   Procedure: EXTRACORPOREAL SHOCK WAVE LITHOTRIPSY (ESWL);  Surgeon: Edwin Dada, MD;  Location: [facility];  Service: Urology;  Laterality: Right;  . FOOT SURGERY Left 2003  . LITHOTRIPSY    . TUBAL LIGATION  2017   Family History  Problem Relation Age of Onset  . Depression Mother   . Asthma Mother   . Hypertension Mother   . Heart disease Father   . Kidney disease Father   .  Breast cancer Paternal Aunt   . Breast cancer Maternal Grandmother   . Breast cancer Cousin    Social History   Tobacco Use  . Smoking status: Current Some Day Smoker    Packs/day: 0.25    Types: Cigarettes  . Smokeless tobacco: Never Used  Substance Use Topics  . Alcohol use: Yes    Comment: occ  . Drug use: Not Currently   Current Outpatient Medications  Medication Sig Dispense Refill  . ARIPiprazole (ABILIFY) 10 MG tablet Take 10 mg by mouth daily.    . Fluoxetine HCl, PMDD, 10 MG TABS Take by mouth.    . hydrOXYzine (ATARAX/VISTARIL) 10 MG tablet Take 10 mg by mouth 2 (two) times a day.    . traZODone (DESYREL) 50 MG tablet Take 50 mg  by mouth at bedtime.     No current facility-administered medications for this visit.    Allergies  Allergen Reactions  . Levofloxacin   . Penicillins Hives    No anaphylaxis  . Phenergan [Promethazine Hcl]     Tremors, restless lef, akathesia 01-26-2018     Review of Systems: All systems reviewed and negative except where noted in HPI.    Lab Results  Component Value Date   WBC 5.7 03/13/2018   HGB 12.6 03/13/2018   HCT 38.3 03/13/2018   MCV 93 03/13/2018   PLT 170 03/13/2018    Lab Results  Component Value Date   CREATININE 0.64 03/13/2018   BUN 11 03/13/2018   NA 139 03/13/2018   K 4.3 03/13/2018   CL 103 03/13/2018   CO2 22 03/13/2018    Lab Results  Component Value Date   ALT 13 03/13/2018   AST 15 03/13/2018   ALKPHOS 60 03/13/2018   BILITOT 0.4 03/13/2018     Physical Exam: NA   ASSESSMENT AND PLAN: 23 y/o female here for a new patient consultation regarding the following:  Epigastric pain / H pylori / GERD - patient with 4-5 months of intermittent epigastric pain. No NSAIDs but she does have frequent pyrosis. She had a positive H pylori serology, was treated by another provider with regimen as outlined above in light of her PCN allergy. She had some benefit with this but her symptoms have not completely resolved. At this point in time recommend an H pylori stool antigen test to evaluate for active infection. If this is positive, we will treat again with another regimen and will need to confirm eradication post treatment. If it is negative, will treat with a course of omeprazole in light of her pyrosis to see if that will help. I will hold off on giving her a trial of PPI right now until her stool antigen is done, as H pylori stool test is not accurate in the setting of PPI use. I will contact her with results of H pylori testing once available with further recommendations. If her symptoms persist despite therapy over time will then consider endoscopy, but  don't feel we need to put her through that right now. She agreed.   [place] Cellar, MD [facility]  CC: Scot Jun, FNP

## 2018-06-15 ENCOUNTER — Ambulatory Visit: Payer: Medicaid Other | Admitting: Family Medicine

## 2018-06-16 ENCOUNTER — Other Ambulatory Visit: Payer: Medicaid Other

## 2018-06-16 DIAGNOSIS — R1013 Epigastric pain: Secondary | ICD-10-CM

## 2018-06-16 DIAGNOSIS — A048 Other specified bacterial intestinal infections: Secondary | ICD-10-CM

## 2018-06-19 LAB — HELICOBACTER PYLORI  SPECIAL ANTIGEN
MICRO NUMBER:: 458374
SPECIMEN QUALITY: ADEQUATE

## 2018-06-22 ENCOUNTER — Other Ambulatory Visit: Payer: Self-pay

## 2018-06-22 MED ORDER — OMEPRAZOLE 40 MG PO CPDR: 40.0000 mg | DELAYED_RELEASE_CAPSULE | Freq: Every day | ORAL | 1 refills | Status: DC

## 2018-07-01 ENCOUNTER — Encounter: Payer: Self-pay | Admitting: Family Medicine

## 2018-08-07 ENCOUNTER — Other Ambulatory Visit: Payer: Self-pay | Admitting: Family Medicine

## 2018-08-07 DIAGNOSIS — D242 Benign neoplasm of left breast: Secondary | ICD-10-CM

## 2018-08-09 ENCOUNTER — Other Ambulatory Visit: Payer: Self-pay

## 2018-08-09 ENCOUNTER — Ambulatory Visit (INDEPENDENT_AMBULATORY_CARE_PROVIDER_SITE_OTHER): Payer: Medicaid Other | Admitting: Family Medicine

## 2018-08-09 DIAGNOSIS — R519 Headache, unspecified: Secondary | ICD-10-CM

## 2018-08-09 DIAGNOSIS — R51 Headache: Secondary | ICD-10-CM | POA: Diagnosis not present

## 2018-08-09 MED ORDER — CYCLOBENZAPRINE HCL 10 MG PO TABS: 10.0000 mg | ORAL_TABLET | Freq: Three times a day (TID) | ORAL | 0 refills | Status: DC | PRN

## 2018-08-09 MED ORDER — NAPROXEN 500 MG PO TABS: 500.0000 mg | ORAL_TABLET | Freq: Two times a day (BID) | ORAL | 0 refills | Status: DC

## 2018-08-09 NOTE — Progress Notes (Signed)
Virtual Visit via Telephone Note  I connected with [name] on 08/09/18 at  3:10 PM EDT by telephone and verified that I am speaking with the correct person using two identifiers.  Location: Patient: Located at primary care office Provider: Located at primary care office     I discussed the limitations, risks, security and privacy concerns of performing an evaluation and management service by telephone and the availability of in person appointments. I also discussed with the patient that there may be a patient responsible charge related to this service. The patient expressed understanding and agreed to proceed.  History of Present Illness: Kellie Martinez is present today via telemedicine encounter for evaluation of chronic headaches. Describes the headaches as aching to throbbing. Headache pain occurs mostly on the top of her head. Occasionally the headache pain will occur bilaterally in the temples of her head or radiate down to her neck.  She endorses occasional blurring of vision however she has had a recent normal eye exam this year.  She reports occasional blurring of vision.  When the headaches occur she is very sensitive to light and loud noises as this worsens the headaches. She reports over the last couple months the headaches have been occurring daily.  She reports a fall in which she sustained an contributory and then a subsequent fall the next month in which she fell and sustained abrasion to her head. She reports presenting to the ER in [facility] for the fall and head injury sustained in February however did not present to the ER for the second fall. She reports both falls related to tripping over her children's toys. She denies weakness, dizziness, or memory loss.   Assessment and Plan: 1. Nonintractable headache, unspecified chronicity pattern, unspecified headache type -Will treat headaches very conservatively.  Will trial naproxen 500 mg twice daily only as needed for  headache pain and cyclobenzaprine at bedtime.     -Referral placed neurology for evaluation of headaches Follow Up Instructions: Will schedule follow-up as needed.   I discussed the assessment and treatment plan with the patient. The patient was provided an opportunity to ask questions and all were answered. The patient agreed with the plan and demonstrated an understanding of the instructions.   The patient was advised to call back or seek an in-person evaluation if the symptoms worsen or if the condition fails to improve as anticipated.  I provided 20 minutes of non-face-to-face time during this encounter.   Molli Barrows, FNP

## 2018-09-01 ENCOUNTER — Encounter: Payer: Self-pay | Admitting: Internal Medicine

## 2018-09-04 ENCOUNTER — Telehealth: Payer: Self-pay

## 2018-09-04 NOTE — Telephone Encounter (Signed)
Call went to voicemail. Unable to do prescreening.

## 2018-09-05 ENCOUNTER — Ambulatory Visit: Payer: Medicaid Other | Admitting: Internal Medicine

## 2018-09-12 ENCOUNTER — Ambulatory Visit
Admission: RE | Admit: 2018-09-12 | Discharge: 2018-09-12 | Disposition: A | Discharge status: Home / Self Care | Payer: Medicaid Other | Source: Ambulatory Visit | Attending: Family Medicine | Admitting: Family Medicine

## 2018-09-12 ENCOUNTER — Other Ambulatory Visit: Payer: Self-pay

## 2018-09-12 DIAGNOSIS — D242 Benign neoplasm of left breast: Secondary | ICD-10-CM

## 2018-09-12 IMAGING — US ULTRASOUND LEFT BREAST LIMITED
1 series · 10 of 10 positions shown · non-contrast
Comparison: Previous exam(s).

CLINICAL DATA: 23-year-old who had biopsy of a mass in the UPPER
INNER LEFT breast in [DATE], pathology revealing
fibroadenoma. She had likely benign clustered cysts in the UPPER
OUTER QUADRANT of the LEFT breast at that time for which short-term
interval follow-up is performed.

EXAM:
ULTRASOUND OF THE LEFT BREAST

[Series 1: ultrasound left breast limited · 0.05mm/px · 10 of 10 slices shown]
[im 1/10]
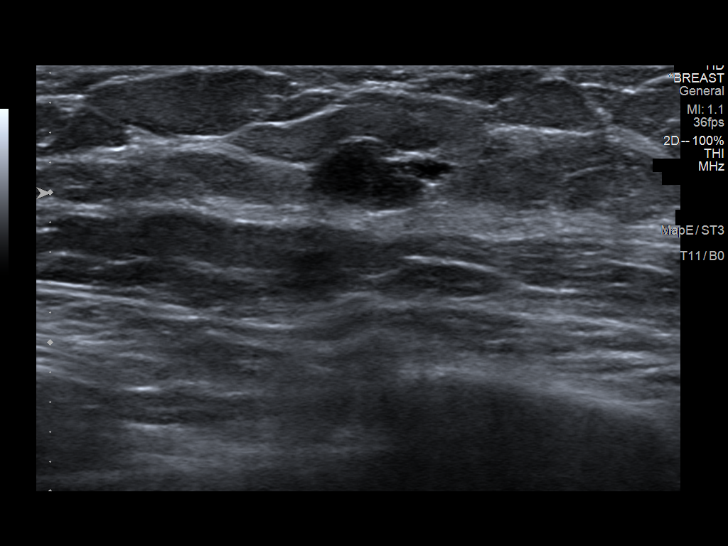
[im 2/10]
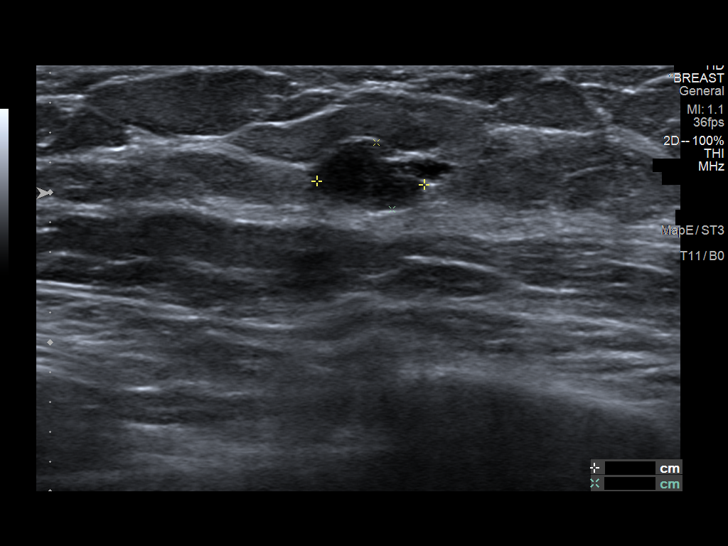
[im 3/10]
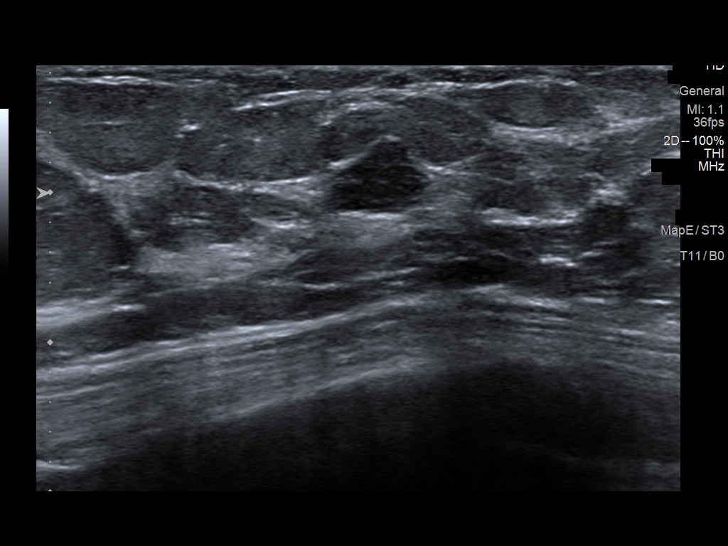
[im 4/10]
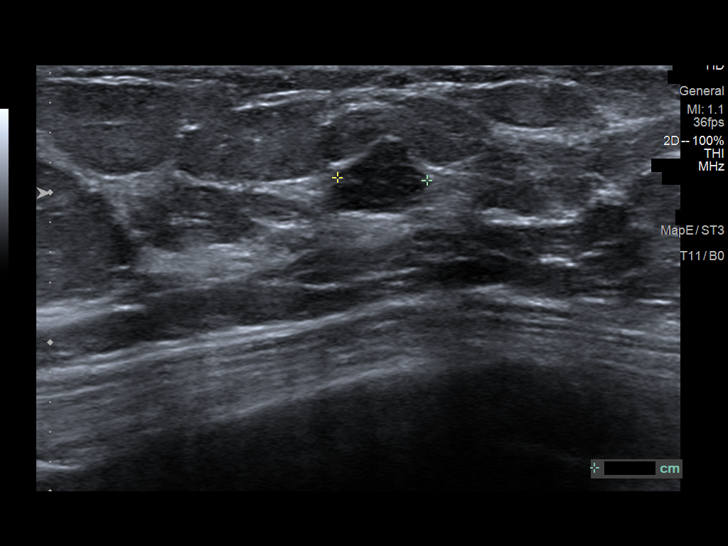
[im 5/10]
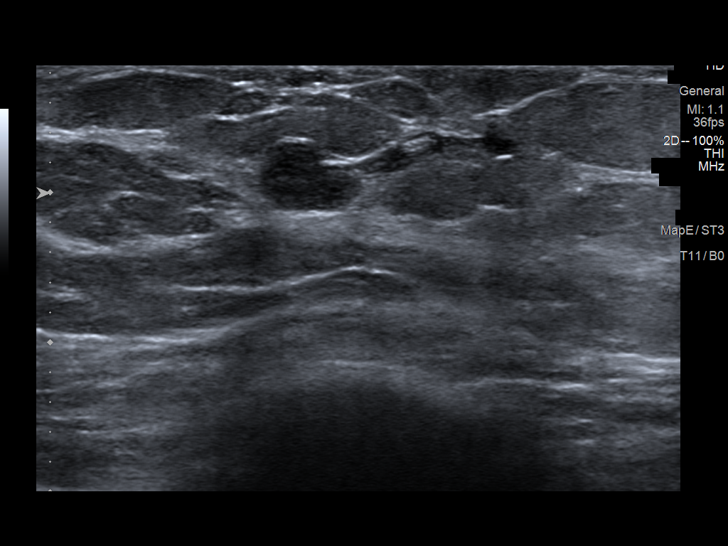
[im 6/10]
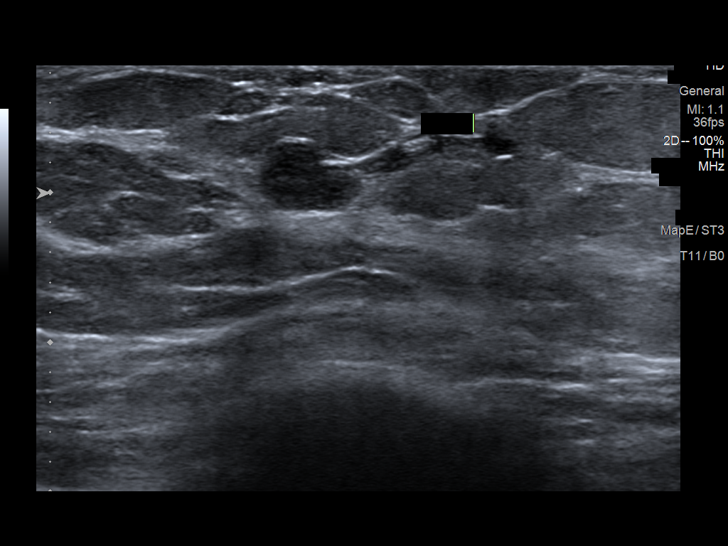
[im 7/10]
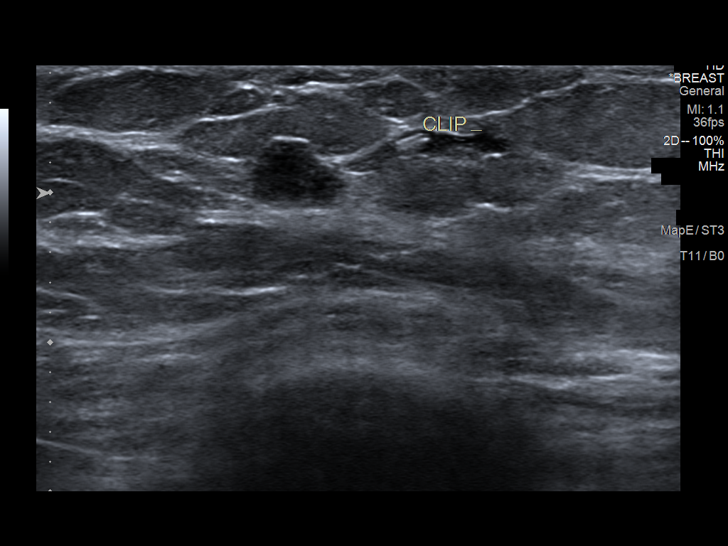
[im 8/10]
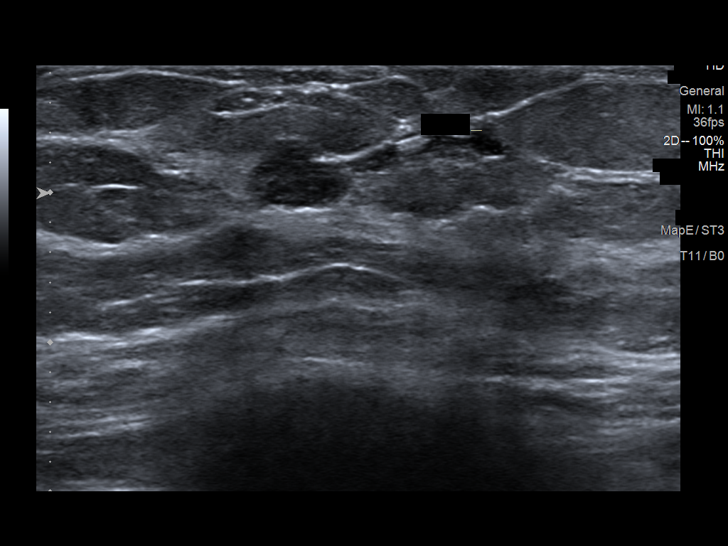
[im 9/10]
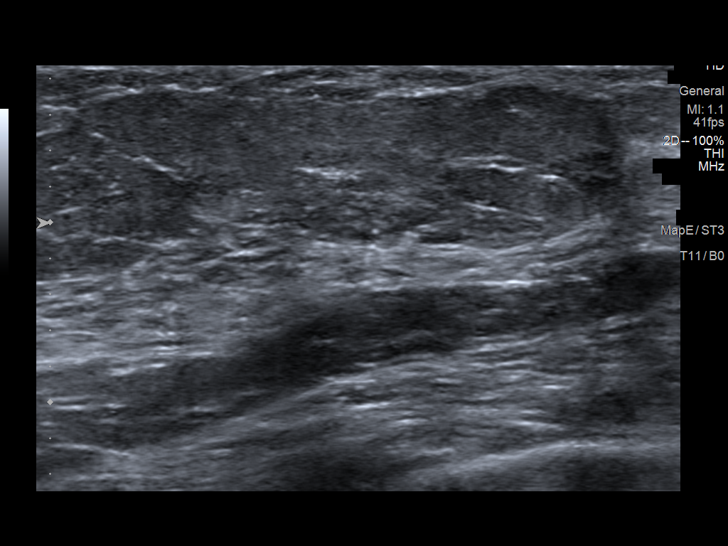
[im 10/10]
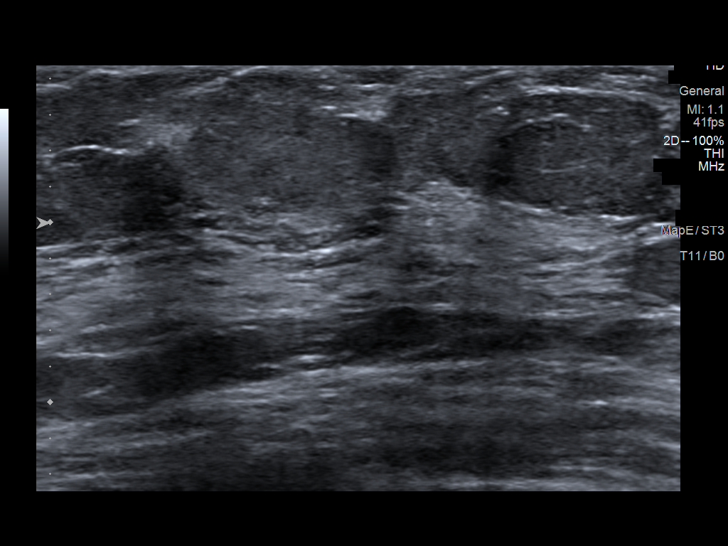

[10 of 10 positions shown; findings below may reference images not displayed]

FINDINGS: The previously identified likely benign clustered cysts at the 2:30
o'clock position approximately 3 cm from the nipple have resolved in
the interval, and normal fibroglandular tissue is present in this
location currently.

The biopsy-proven fibroadenoma at the 10 o'clock position
approximately 5 cm from the nipple as perhaps slightly decreased in
size in the interval, now measuring approximately 5 x 7 x 6 mm
(previously 5 x 8 x 9 mm on the [DATE] ultrasound). The tract
from the prior biopsy is visible adjacent to the fibroadenoma.
IMPRESSION: 1. Interval resolution of the previously identified clustered cysts
in the UPPER OUTER QUADRANT of the LEFT breast.
2. Interval slight decrease in size of the biopsy-proven benign
fibroadenoma involving the UPPER INNER QUADRANT of the LEFT breast.
3. No sonographic evidence of malignancy involving the LEFT breast.

RECOMMENDATION:
Screening mammogram at age 38 unless there are persistent or
subsequent clinical concerns. (Code:[ZP])

I have discussed the findings and recommendations with the patient.

BI-RADS CATEGORY  2: Benign.

## 2018-09-18 ENCOUNTER — Telehealth: Payer: Self-pay

## 2018-09-18 NOTE — Telephone Encounter (Signed)
Called patient to do their pre-visit COVID screening.  Patient states that she doesn't have transportation to her appointment tomorrow.

## 2018-09-19 ENCOUNTER — Encounter: Payer: Medicaid Other | Admitting: Licensed Clinical Social Worker

## 2018-09-19 ENCOUNTER — Ambulatory Visit: Payer: Medicaid Other | Admitting: Internal Medicine

## 2018-09-25 ENCOUNTER — Ambulatory Visit: Payer: Medicaid Other | Admitting: Neurology

## 2018-09-27 ENCOUNTER — Encounter: Payer: Self-pay | Admitting: Nurse Practitioner

## 2018-09-27 ENCOUNTER — Ambulatory Visit (INDEPENDENT_AMBULATORY_CARE_PROVIDER_SITE_OTHER): Payer: Medicaid Other | Admitting: Nurse Practitioner

## 2018-09-27 DIAGNOSIS — M545 Low back pain, unspecified: Secondary | ICD-10-CM

## 2018-09-27 DIAGNOSIS — T50905D Adverse effect of unspecified drugs, medicaments and biological substances, subsequent encounter: Secondary | ICD-10-CM | POA: Diagnosis not present

## 2018-09-27 NOTE — Progress Notes (Signed)
Virtual Visit via Telephone Note Due to national recommendations of social distancing due to [place] 19, telehealth visit is felt to be most appropriate for this patient at this time.  I discussed the limitations, risks, security and privacy concerns of performing an evaluation and management service by telephone and the availability of in person appointments. I also discussed with the patient that there may be a patient responsible charge related to this service. The patient expressed understanding and agreed to proceed.    I connected with Kellie Martinez on 09/27/18  at   3:10 PM EDT  EDT by telephone and verified that I am speaking with the correct person using two identifiers.   Consent I discussed the limitations, risks, security and privacy concerns of performing an evaluation and management service by telephone and the availability of in person appointments. I also discussed with the patient that there may be a patient responsible charge related to this service. The patient expressed understanding and agreed to proceed.   Location of Patient: Private Residence   Location of Provider: [place] participating in Telemedicine visit: [name] FNP-BC [place]    History of Present Illness: Telemedicine visit for: Medication Side effects  She is currently being prescribed prozac, trazodone and hydroxyzine and states she was placed on trileptal several weeks ago and stopped taking it as it was making her not feel right. I have instructed her to discuss this with the ordering prescriber. She would not give me the name of the prescriber or clinic she is going to for her mental health needs.   Conversation is tangential. After I recommended she discuss her psychotropic medications with her Prescriber she states well what about my UTI. She has not been diagnosed with a UTI but states she has a UTI and needs treatment for it. I  instructed her that she would need to drop off a urine sample. She said she knows it's a UTI because her back is hurting. I recommended she come to the office to provide a urine sample and she declines.  She then stated and I also hit my head so what can you do about that. I asked if she was having any headaches, nausea or visual disturbances and she stated no. Then she said well I need to know if I should go to the Emergency room or not. I instructed her that She would only need to go if she is having symptoms from the incident. There was a long pause and I asked if she had any additional questions and she stated no and the phone call ended.    Past Medical History:  Diagnosis Date  . Anemia   . Anxiety   . Asthma   . Bipolar 1 disorder ([place])   . 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  . Chronic kidney disease   . Depression   . GERD (gastroesophageal reflux disease)   . Molar pregnancy   . Renal calculus or stone    had stent/ lithotripsy in past  . Sleep apnea   . Substance abuse ([place])   . Suicide attempt ([place])    x 2    Past Surgical History:  Procedure Laterality Date  . CYSTOSCOPY     x2 with stone extraction  . EXTRACORPOREAL SHOCK WAVE LITHOTRIPSY Right 06/20/2013   Procedure: EXTRACORPOREAL SHOCK WAVE LITHOTRIPSY (ESWL);  Surgeon: Edwin Dada, MD;  Location: [facility];  Service: Urology;  Laterality: Right;  .  FOOT SURGERY Left 2003  . LITHOTRIPSY    . TUBAL LIGATION  2017    Family History  Problem Relation Age of Onset  . Depression Mother   . Asthma Mother   . Hypertension Mother   . Mood Disorder Mother   . Heart disease Father   . Kidney disease Father   . Alcoholism Father   . Breast cancer Paternal Aunt   . Breast cancer Maternal Grandmother   . Breast cancer Cousin   . Alcoholism Brother   . Drug abuse Brother     Social History   Socioeconomic History  . Marital status: Divorced    Spouse name: Not on file  . Number of children: 3  . Years of education: Not on  file  . Highest education level: Not on file  Occupational History  . Not on file  Social Needs  . Financial resource strain: Not on file  . Food insecurity    Worry: Not on file    Inability: Not on file  . Transportation needs    Medical: Not on file    Non-medical: Not on file  Tobacco Use  . Smoking status: Current Some Day Smoker    Packs/day: 0.25    Types: Cigarettes  . Smokeless tobacco: Never Used  Substance and Sexual Activity  . Alcohol use: Yes    Comment: occ  . Drug use: Not Currently  . Sexual activity: Yes    Birth control/protection: I.U.D.  Lifestyle  . Physical activity    Days per week: Not on file    Minutes per session: Not on file  . Stress: Not on file  Relationships  . Social Herbalist on phone: Not on file    Gets together: Not on file    Attends religious service: Not on file    Active member of club or organization: Not on file    Attends meetings of clubs or organizations: Not on file    Relationship status: Not on file  Other Topics Concern  . Not on file  Social History Narrative  . Not on file     Observations/Objective: Awake, alert and oriented x 3   Review of Systems  Constitutional: Negative for fever, malaise/fatigue and weight loss.  HENT: Negative.  Negative for nosebleeds.   Eyes: Negative.  Negative for blurred vision, double vision and photophobia.  Respiratory: Negative.  Negative for cough and shortness of breath.   Cardiovascular: Negative.  Negative for chest pain, palpitations and leg swelling.  Gastrointestinal: Negative.  Negative for heartburn, nausea and vomiting.  Genitourinary: Positive for flank pain. Negative for dysuria and hematuria.  Musculoskeletal: Positive for back pain. Negative for myalgias.  Neurological: Negative.  Negative for dizziness, focal weakness, seizures and headaches.  Psychiatric/Behavioral: Negative for suicidal ideas. The patient is nervous/anxious.     Assessment and  Plan: Diagnoses and all orders for this visit:  Adverse effect of drug, subsequent encounter She was instructed to follow up with behavioral health provider  Acute midline low back pain without sciatica Patient declines to provide urine sample    Follow Up Instructions Return if symptoms worsen or fail to improve.     I discussed the assessment and treatment plan with the patient. The patient was provided an opportunity to ask questions and all were answered. The patient agreed with the plan and demonstrated an understanding of the instructions.   The patient was advised to call back or seek an in-person  evaluation if the symptoms worsen or if the condition fails to improve as anticipated.  I provided 14 minutes of non-face-to-face time during this encounter including median intraservice time, reviewing previous notes, labs, imaging, medications and explaining diagnosis and management.  Gildardo Pounds, FNP-BC

## 2018-09-27 NOTE — Progress Notes (Signed)
Patient is having issues with her psych medication. Was started on Trileptal 150 mg BID in July & on 09/14/2018 it was increased to 300 mg BID. She states that she stopped medication about a week ago because "it makes her crazy".

## 2018-10-21 ENCOUNTER — Encounter (HOSPITAL_COMMUNITY): Payer: Self-pay | Admitting: Emergency Medicine

## 2018-10-21 ENCOUNTER — Other Ambulatory Visit: Payer: Self-pay

## 2018-10-21 ENCOUNTER — Emergency Department (HOSPITAL_COMMUNITY): Payer: Medicaid Other

## 2018-10-21 ENCOUNTER — Emergency Department (HOSPITAL_COMMUNITY)
Admission: EM | Admit: 2018-10-21 | Discharge: 2018-10-21 | Disposition: A | Discharge status: Home / Self Care | Payer: Medicaid Other | Attending: Emergency Medicine | Admitting: Emergency Medicine

## 2018-10-21 DIAGNOSIS — J45909 Unspecified asthma, uncomplicated: Secondary | ICD-10-CM | POA: Diagnosis not present

## 2018-10-21 DIAGNOSIS — N189 Chronic kidney disease, unspecified: Secondary | ICD-10-CM | POA: Diagnosis not present

## 2018-10-21 DIAGNOSIS — Y939 Activity, unspecified: Secondary | ICD-10-CM | POA: Diagnosis not present

## 2018-10-21 DIAGNOSIS — W010XXA Fall on same level from slipping, tripping and stumbling without subsequent striking against object, initial encounter: Secondary | ICD-10-CM | POA: Diagnosis not present

## 2018-10-21 DIAGNOSIS — Y929 Unspecified place or not applicable: Secondary | ICD-10-CM | POA: Insufficient documentation

## 2018-10-21 DIAGNOSIS — Y999 Unspecified external cause status: Secondary | ICD-10-CM | POA: Diagnosis not present

## 2018-10-21 DIAGNOSIS — F17210 Nicotine dependence, cigarettes, uncomplicated: Secondary | ICD-10-CM | POA: Insufficient documentation

## 2018-10-21 DIAGNOSIS — S59901A Unspecified injury of right elbow, initial encounter: Secondary | ICD-10-CM | POA: Insufficient documentation

## 2018-10-21 DIAGNOSIS — Z79899 Other long term (current) drug therapy: Secondary | ICD-10-CM | POA: Diagnosis not present

## 2018-10-21 IMAGING — DX DG ELBOW COMPLETE 3+V*R*
4 series · 4 of 4 positions shown · non-contrast
Comparison: None.

CLINICAL DATA: Patient tripped and fell.  Elbow swelling.

EXAM:
RIGHT ELBOW - COMPLETE 3+ VIEW

[elbow ap]
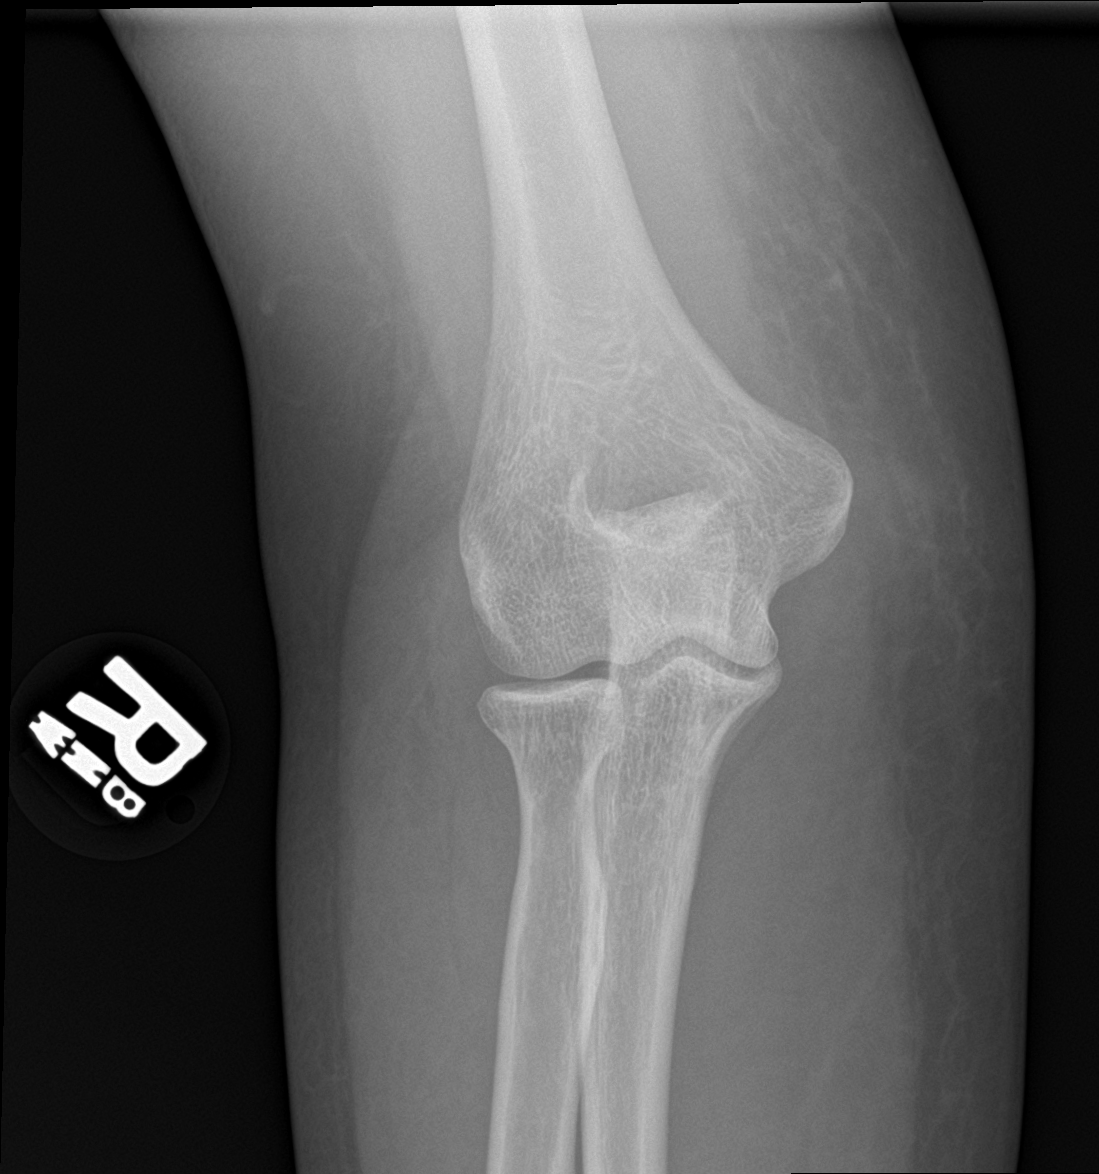

[elbow obl (1 of 2)]
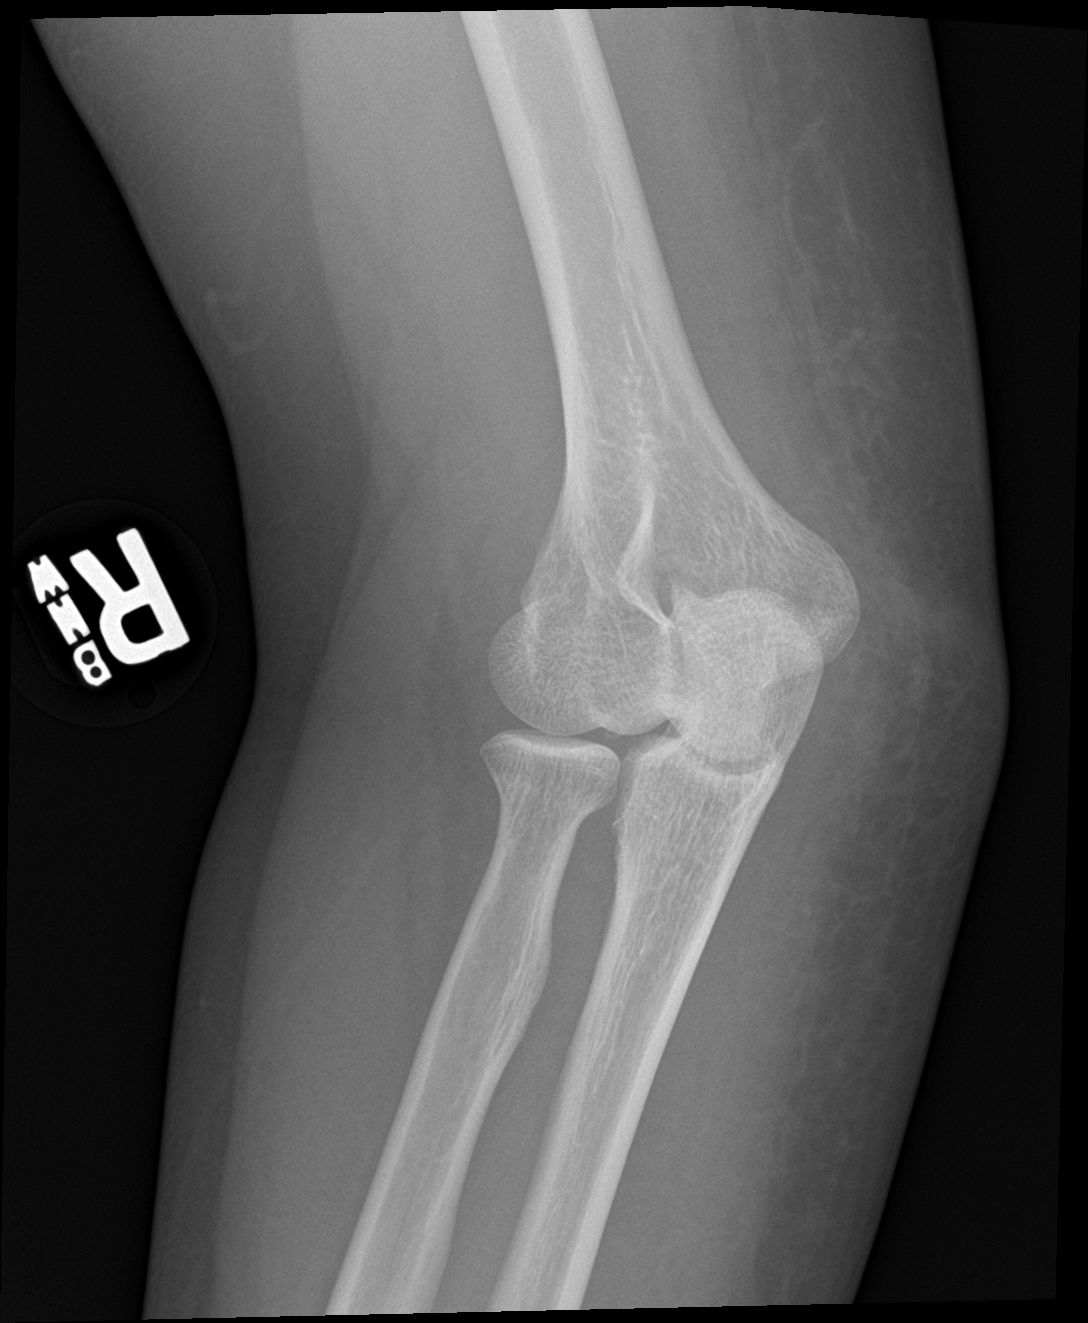

[elbow obl (2 of 2)]
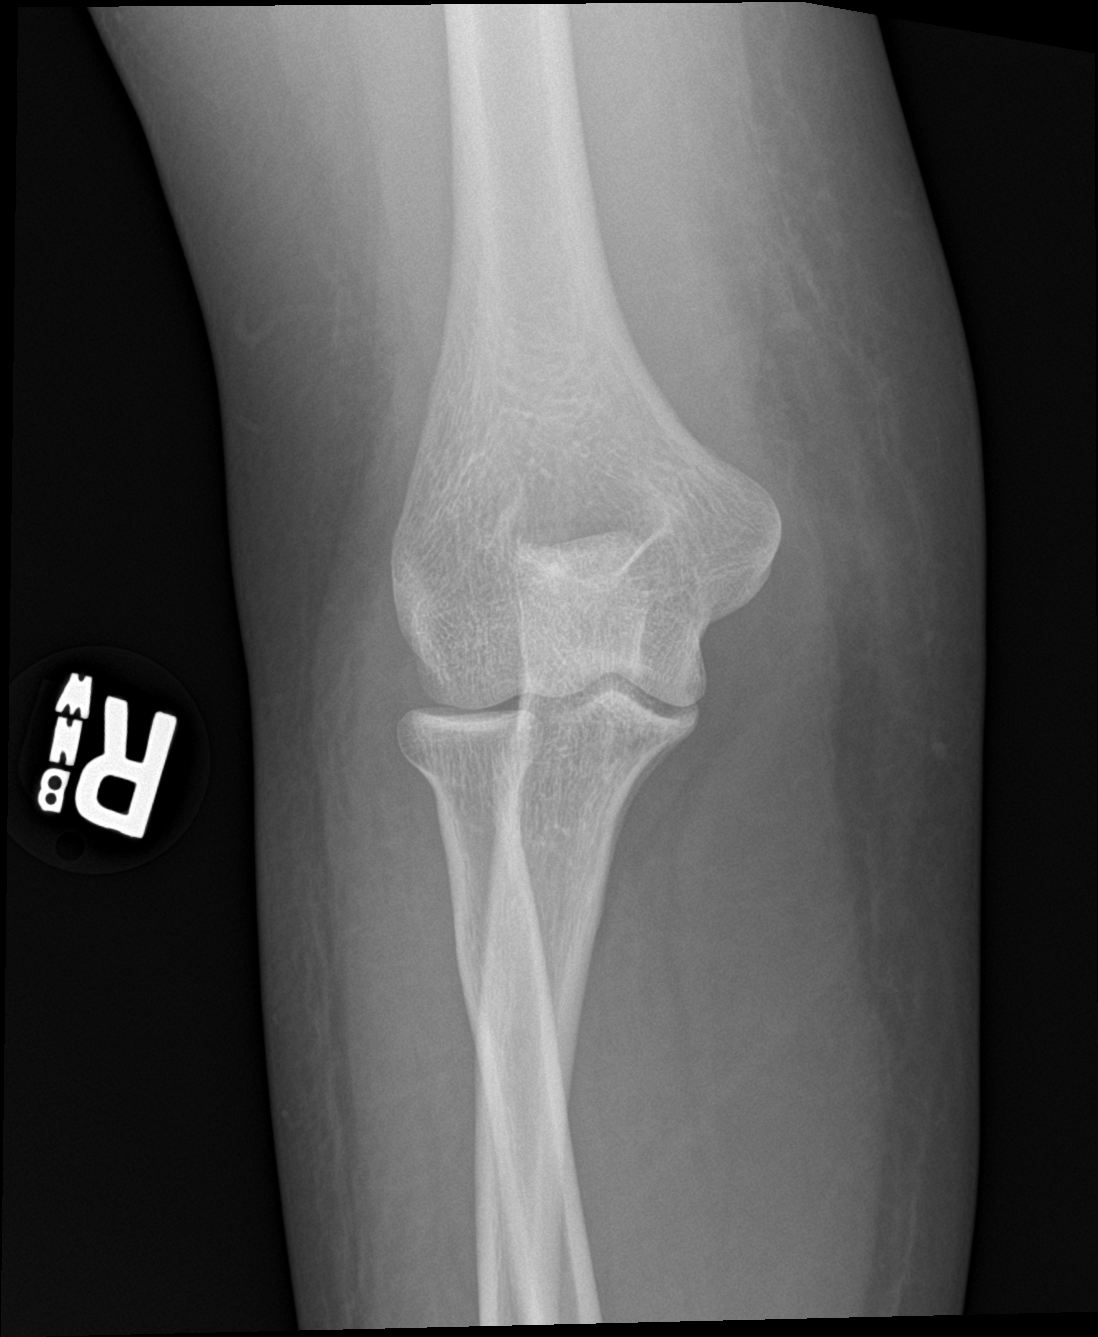

[elbow lat]
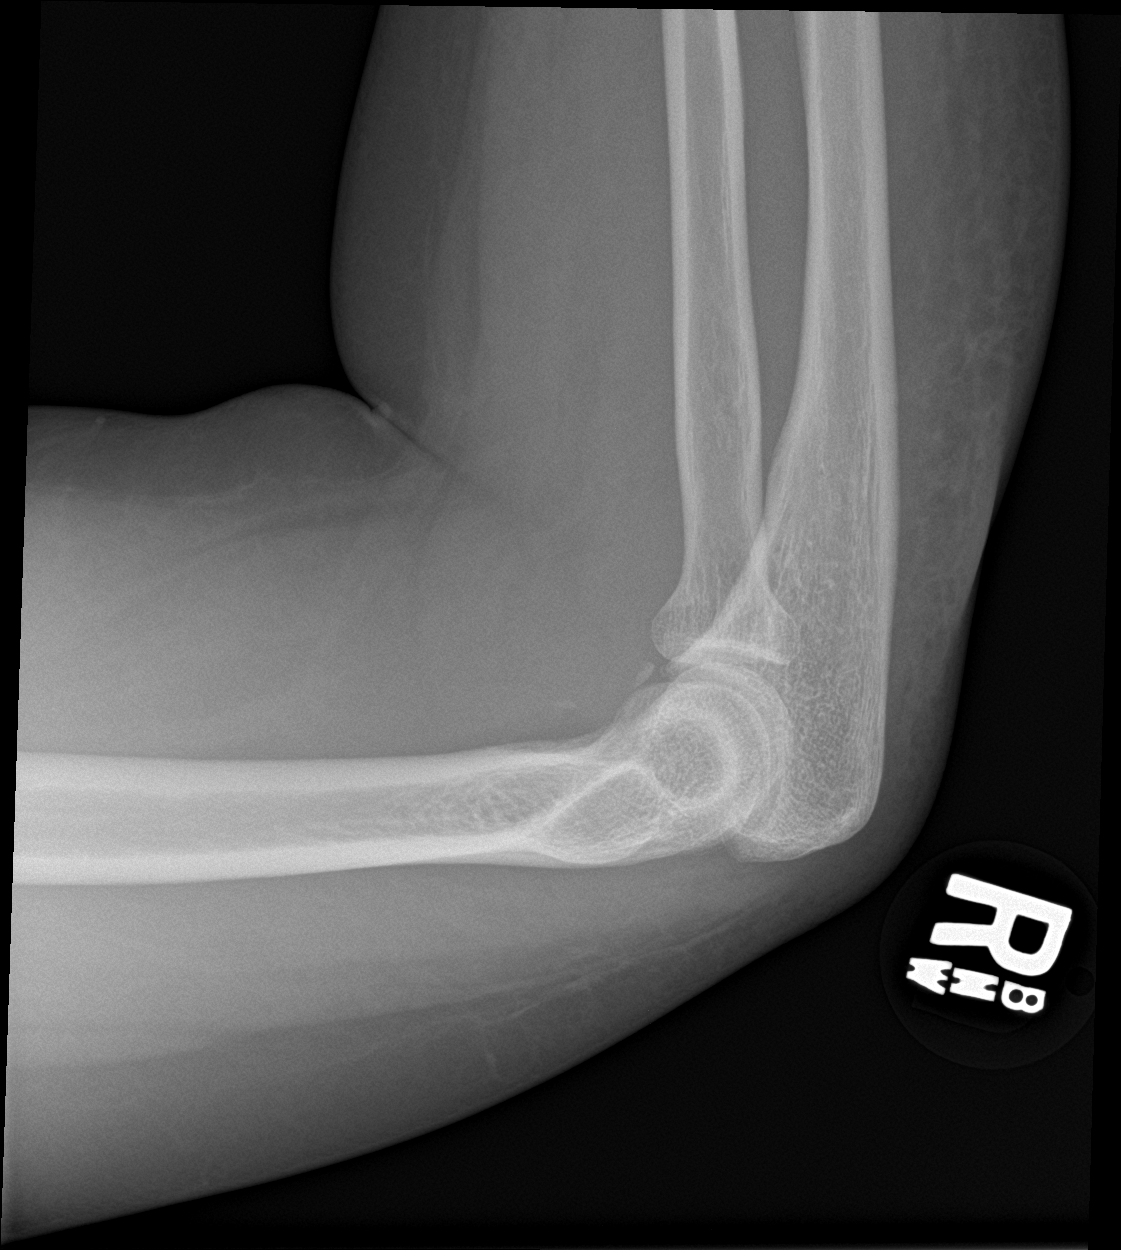

[4 of 4 positions shown; findings below may reference images not displayed]

FINDINGS: Four views study shows no gross fracture or dislocation. No fat pad
elevation to suggest joint effusion. Lateral film demonstrates 2
osseous fragments anterior to the distal humerus. Although age
indeterminate, these are likely either cortical avulsion fragments
or mineralization in the distal biceps tendon secondary to chronic
repetitive trauma.
IMPRESSION: No gross fracture or dislocation. Small osseous fragments are
identified in the soft tissues anterior to the distal humerus,
potentially relating to cortical avulsion fragments or
chronic/repetitive trauma in the distal biceps tendon. MRI follow-up
may prove helpful to further evaluate.

## 2018-10-21 IMAGING — DX DG SHOULDER 2+V*R*
3 series · 3 of 3 positions shown · non-contrast
Comparison: None.

CLINICAL DATA: 23-year-old female with history of trauma from a
fall complaining of right shoulder pain.

EXAM:
RIGHT SHOULDER - 2+ VIEW

[shoulder grashey]
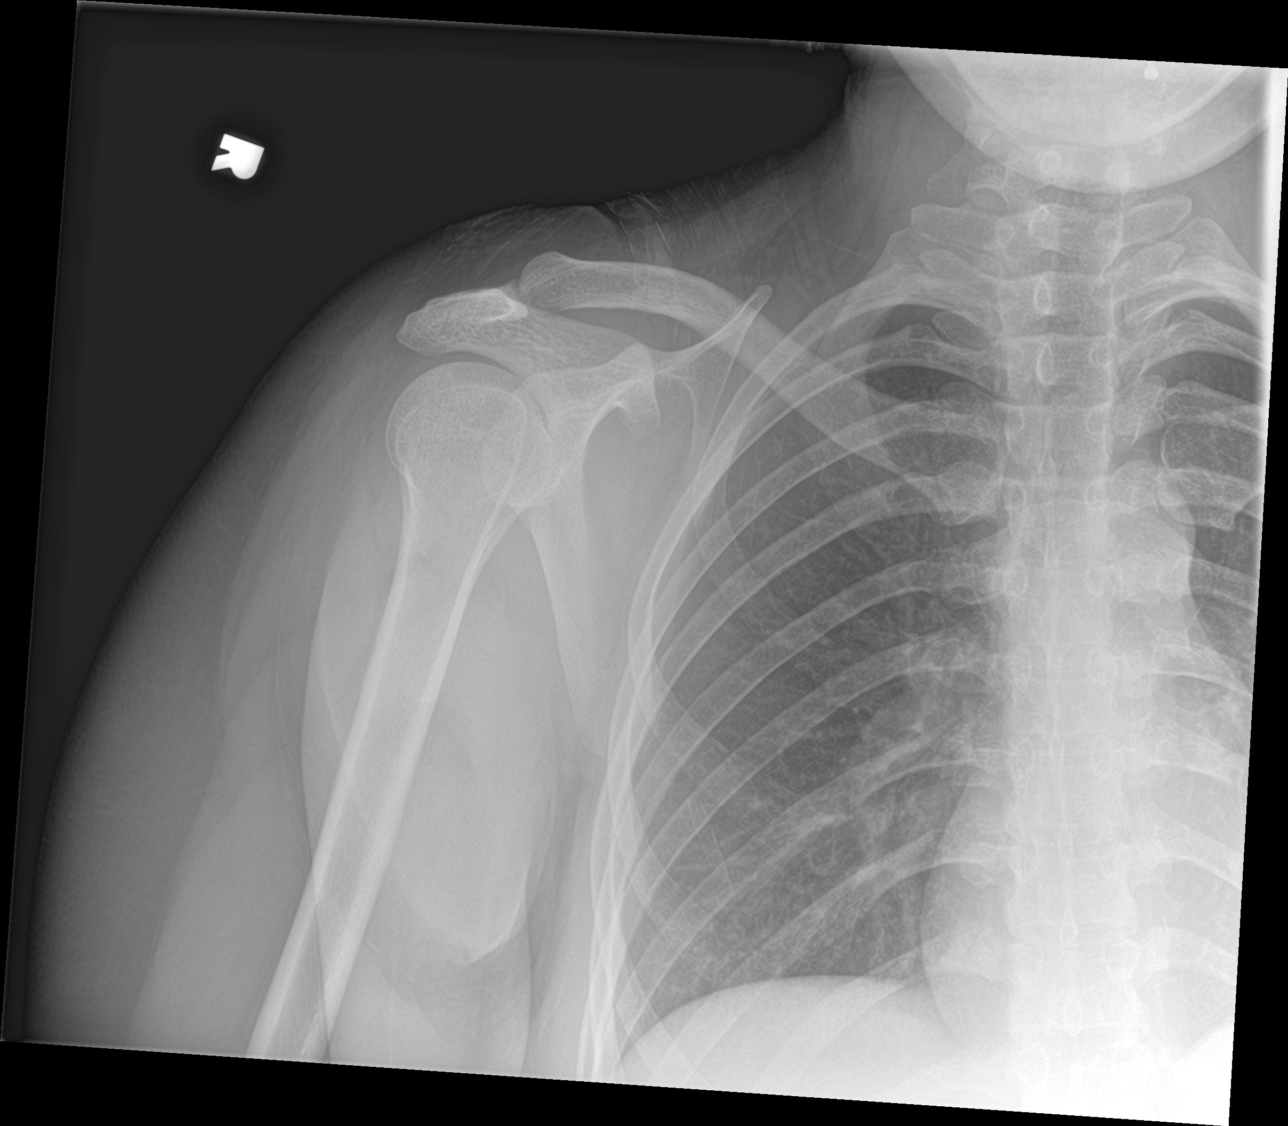

[shoulder y view]
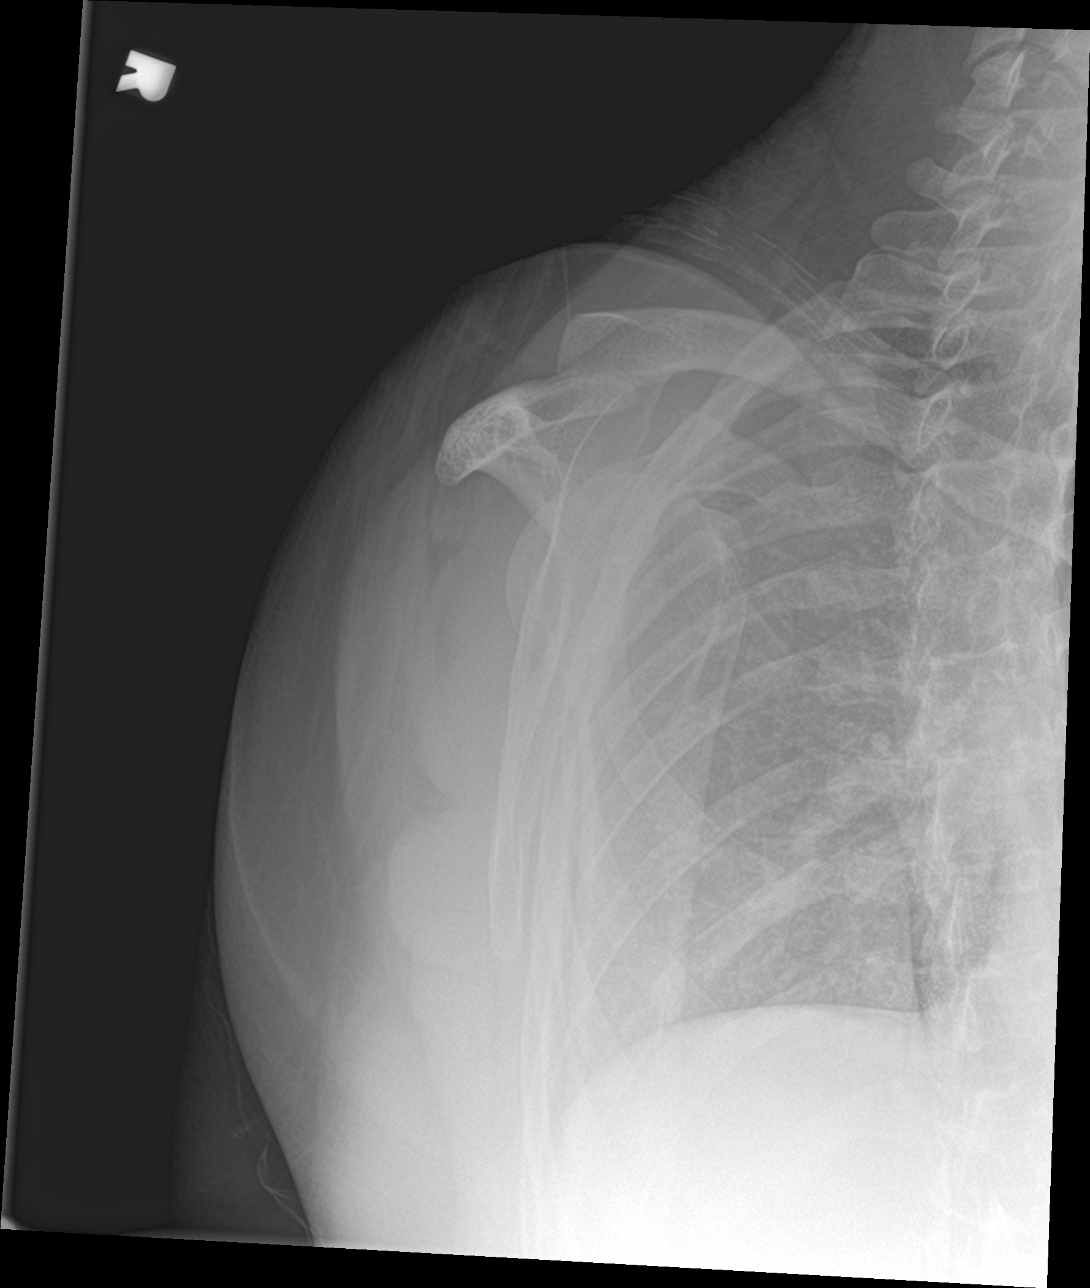

[shoulder axillary]
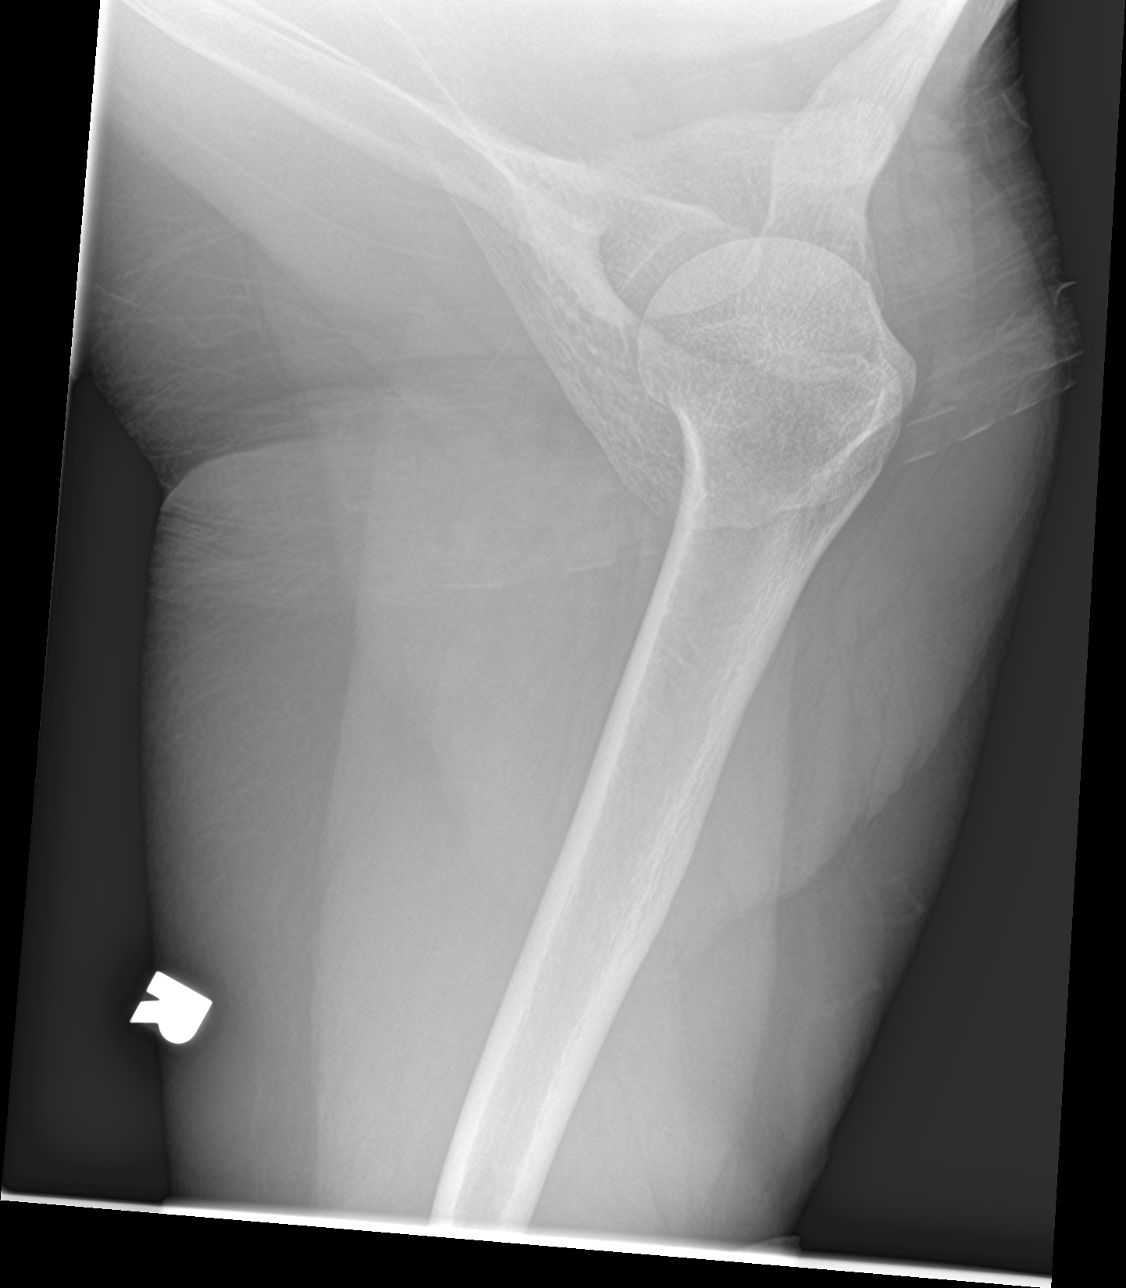

[3 of 3 positions shown; findings below may reference images not displayed]

FINDINGS: There is no evidence of fracture or dislocation. There is no
evidence of arthropathy or other focal bone abnormality. Soft
tissues are unremarkable.
IMPRESSION: Negative.

## 2018-10-21 IMAGING — CT CT ELBOW*R* W/O CM
4 of 5 series · 14 of 35 positions shown, 16 images · non-contrast
Comparison: Radiographs dated [DATE]

CLINICAL DATA: Right elbow pain, swelling, and bruising secondary
to a fall 2 days ago.

EXAM:
CT OF THE LOWER RIGHT EXTREMITY WITHOUT CONTRAST
TECHNIQUE: Multidetector CT imaging of the right lower extremity was performed
according to the standard protocol.

[Series 8: axial st · axial · 0.21mm/px · z∈[-610,-537]mm · 3 of 99 slices shown, 4 images (1 of 2)]
[im 25/99  soft-tissue]
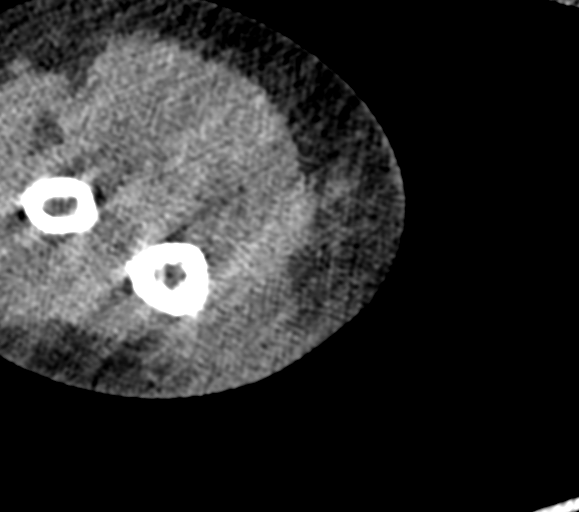
[im 25/99  bone]
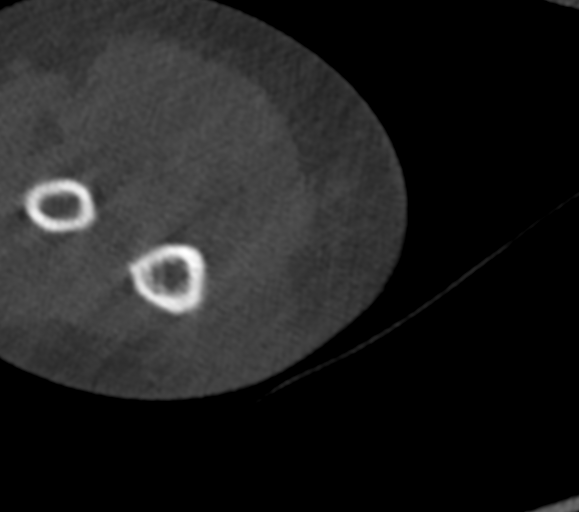
[im 50/99  bone]
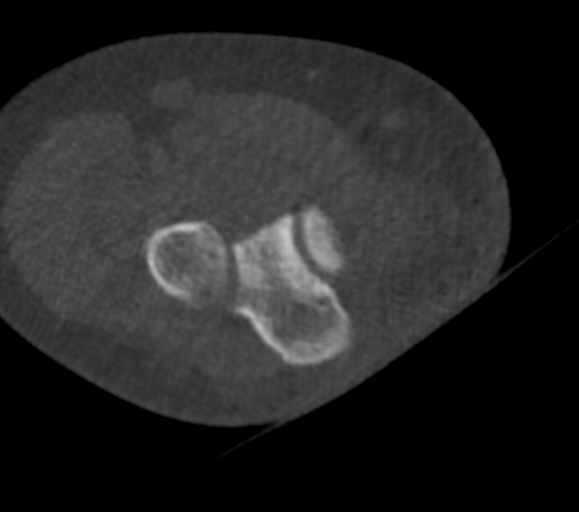
[im 74/99  bone]
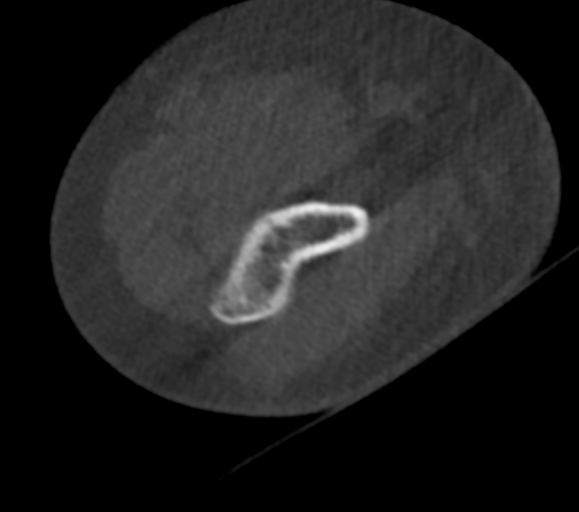

[Series 9: cor st · coronal · 0.27mm/px · 3 of 70 slices shown]
[im 14/70  bone]
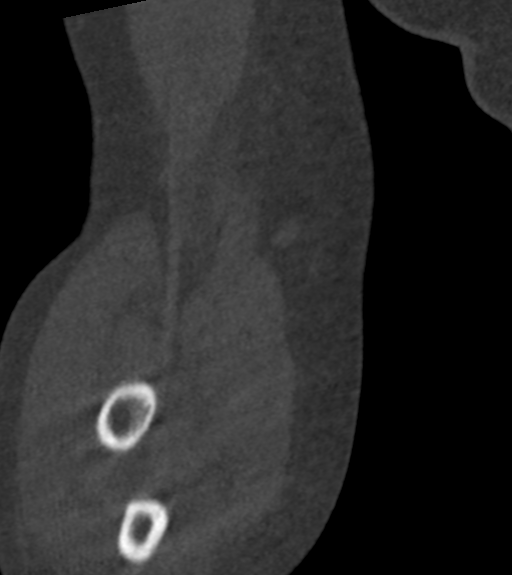
[im 28/70  bone]
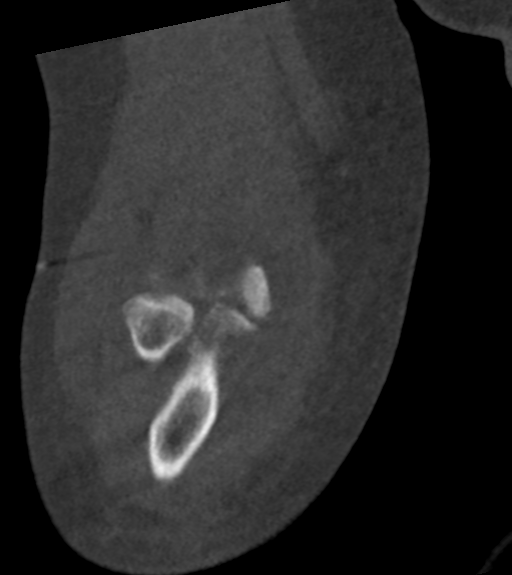
[im 42/70  bone]
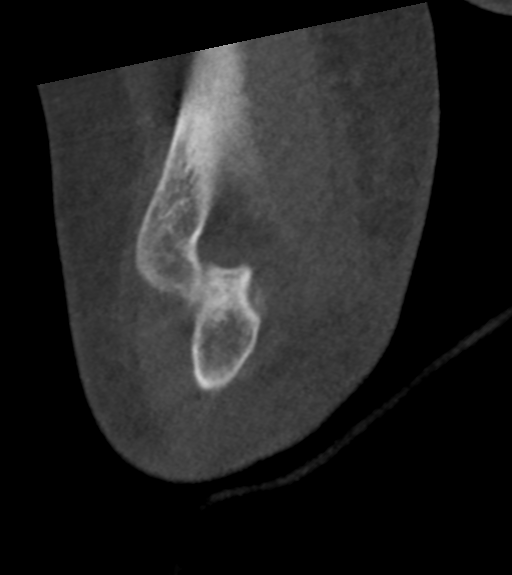

[Series 10: sag st · sagittal · 0.21mm/px · 5 of 77 slices shown, 6 images]
[im 26/77  bone]
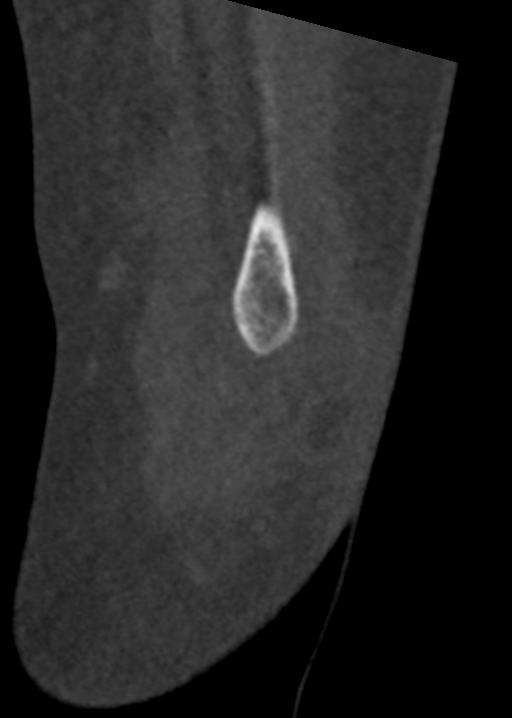
[im 32/77  bone]
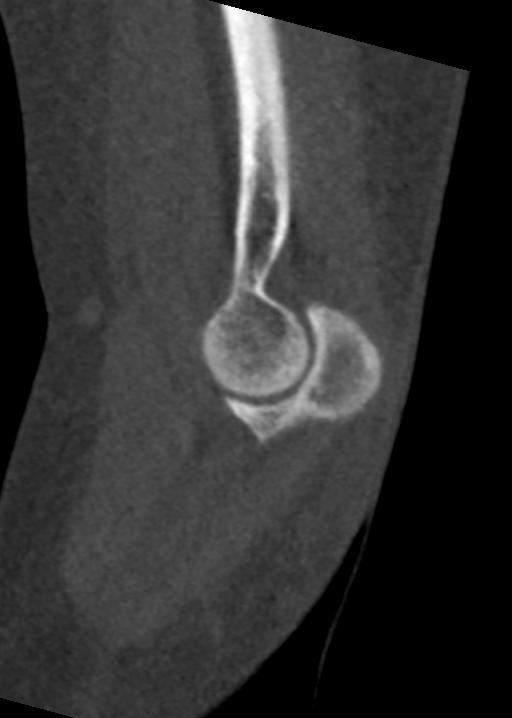
[im 39/77  soft-tissue]
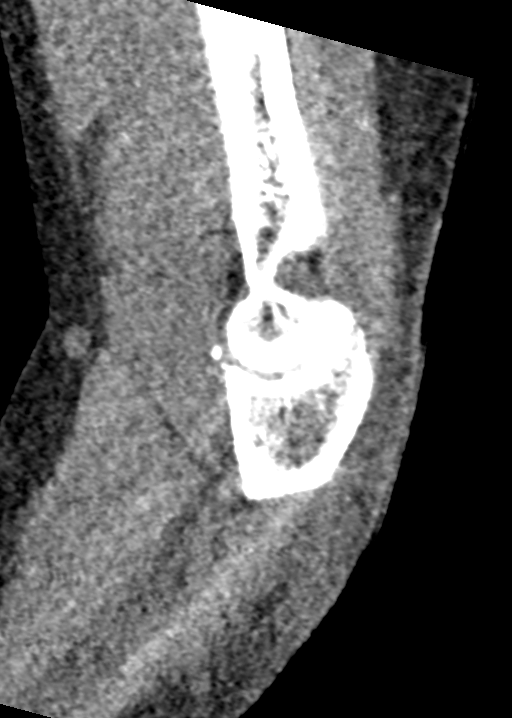
[im 39/77  bone]
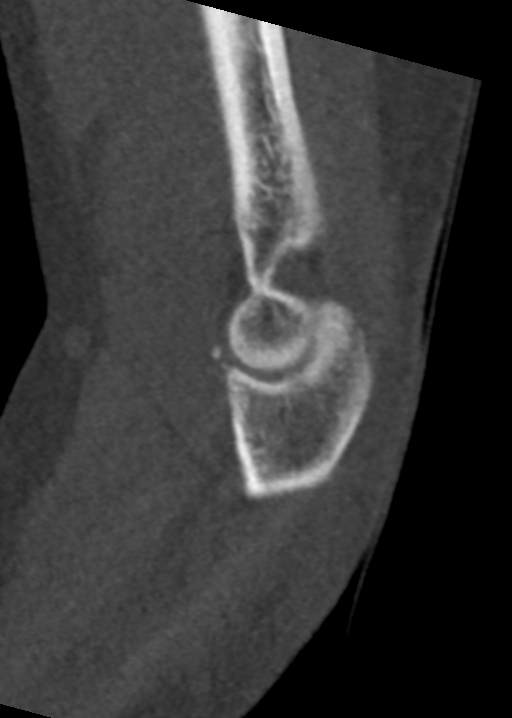
[im 45/77  bone]
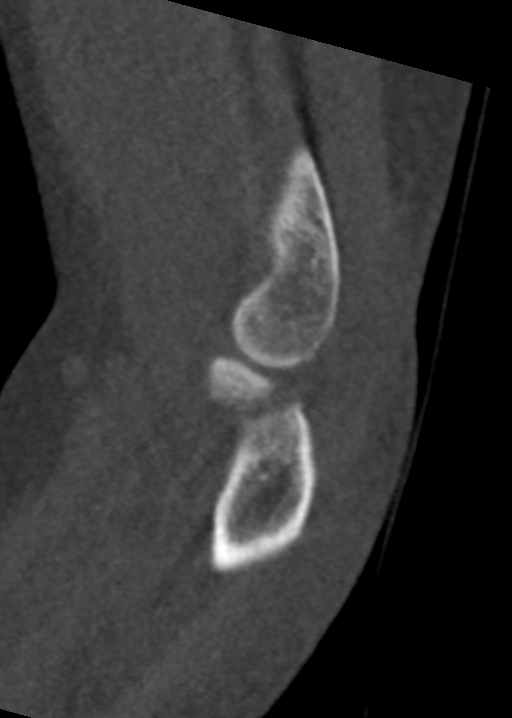
[im 51/77  bone]
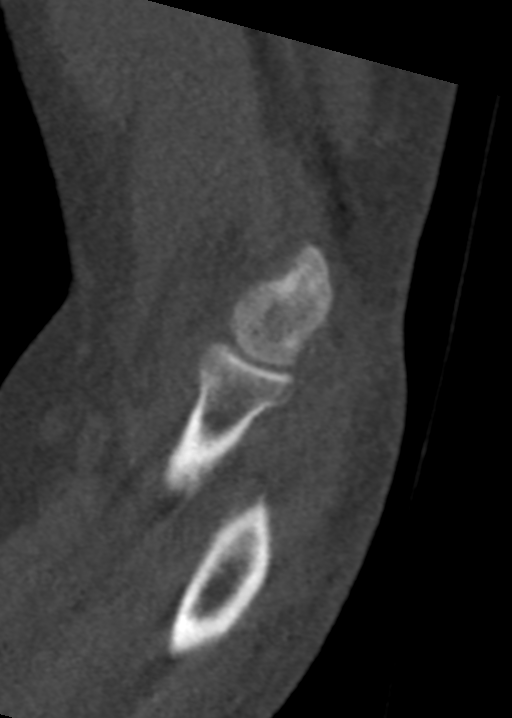

[Series 11: axial st · axial · 0.21mm/px · z∈[-610,-537]mm · 3 of 99 slices shown (2 of 2)]
[im 25/99  bone]
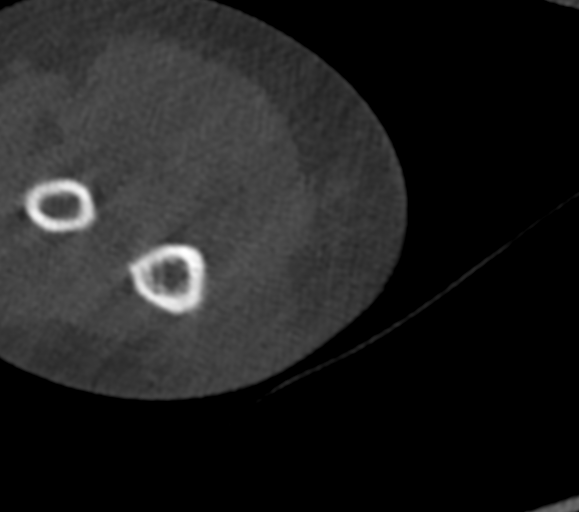
[im 50/99  bone]
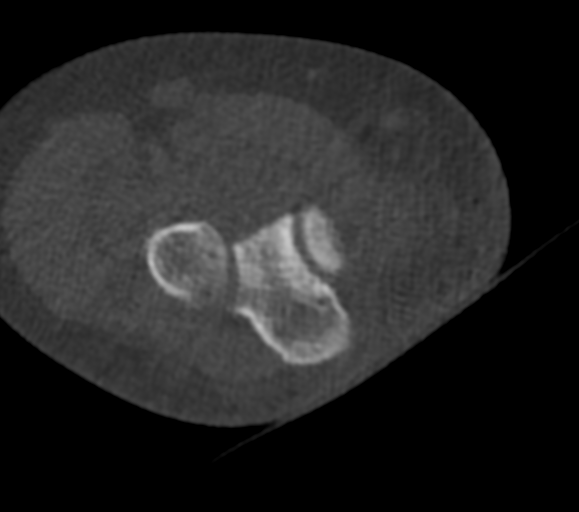
[im 74/99  bone]
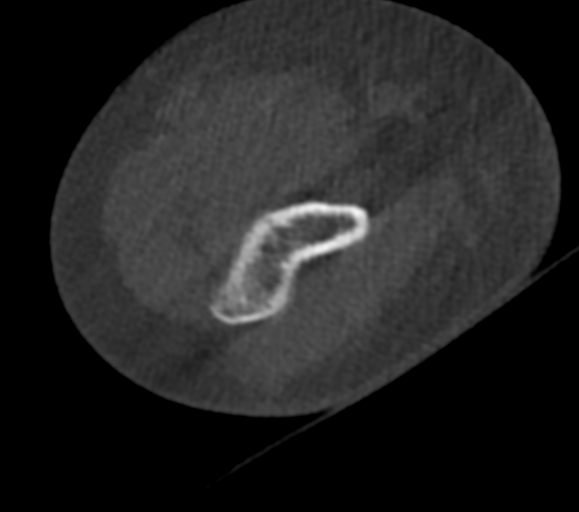

[14 of 35 positions shown; findings below may reference images not displayed]

FINDINGS: Bones/Joint/Cartilage

There is a tiny bone avulsion of the tip of the coronoid process of
the proximal ulna. There is second tiny calcification anterior to
the capitellum in the joint space which may represent a tiny avulsed
fragment is well. The distal humerus appears normal. Proximal radius
appears normal. Or appreciable joint effusion. I suspect these small
calcifications probably represent old tiny avulsion.

Muscles and Tendons

No discrete abnormality.

Soft tissues

Edema in the subcutaneous soft tissues posterior to the elbow and in
the posterior aspect of the proximal forearm consistent with soft
tissue contusion. There is also edema in the soft tissues adjacent
to the medial epicondyle. Ligaments cannot be assessed on the CT
scan.
IMPRESSION: 1. Two small calcified fragments in the elbow joint, likely
representing tiny old avulsions. No discrete joint effusion to
suggest an acute avulsion.
2. Soft tissue edema around the elbow as described consistent with
soft tissue injury.

## 2018-10-21 MED ORDER — HYDROCODONE-ACETAMINOPHEN 5-325 MG PO TABS
1.0000 | ORAL_TABLET | Freq: Once | ORAL | Status: AC
  Administered 2018-10-21: 12:00:00 1 via ORAL
  Filled 2018-10-21: qty 1

## 2018-10-21 MED ORDER — HYDROCODONE-ACETAMINOPHEN 5-325 MG PO TABS: 1.0000 | ORAL_TABLET | Freq: Four times a day (QID) | ORAL | 0 refills | Status: DC | PRN

## 2018-10-21 NOTE — ED Triage Notes (Signed)
Pt states she was walking to the store a few days ago and tripped in a hole and fell on her right arm. Swelling noted to elbow.

## 2018-10-21 NOTE — Discharge Instructions (Addendum)
Keep the splint in place, clean, and dry at all times. Schedule appointment with the orthopedic specialist for 1 week for follow up and further management. You can take the medication as prescribed as needed for severe pain.  Be aware this medication can make you drowsy, do not drive or drink alcohol while taking it.  Be aware there is also Tylenol this medication. It is safe to take ibuprofen every 6 hours for pain and swelling, in addition to the hydrocodone.

## 2018-10-21 NOTE — ED Provider Notes (Signed)
[facility] EMERGENCY DEPARTMENT Provider Note   CSN: [id number] Arrival date & time: 10/21/18  0848     History   Chief Complaint Chief Complaint  Patient presents with  . Arm Injury    HPI MAJESTI DUHN is a 23 y.o. female with past medical history of CKD, GERD, bipolar 1 disorder, asthma, presenting to the emergency department with sudden onset of right elbow pain that began a few days ago after a mechanical fall.  She states she was walking into a store when she did not see a hole and tripped.  She states she fell with her right arm extended and up by her head though it twisted.  She has had pain generalized to the right elbow and shoulder since the fall.  She has associated swelling and bruising to the right elbow.  Movement of her fingers and wrists causes shooting pain up towards her elbow.  She is treated her symptoms with ibuprofen without much relief.  She did not hit her head or pass out.  She denies any other injuries.     The history is provided by the patient.    Past Medical History:  Diagnosis Date  . Anemia   . Anxiety   . Asthma   . Bipolar 1 disorder ([place])   . 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  . Chronic kidney disease   . Depression   . GERD (gastroesophageal reflux disease)   . Molar pregnancy   . Renal calculus or stone    had stent/ lithotripsy in past  . Sleep apnea   . Substance abuse ([place])   . Suicide attempt ([place])    x 2    Patient Active Problem List   Diagnosis Date Noted  . Precordial pain 07/07/2017  . History of renal stone 05/18/2017  . Umbilical hernia [id number]  . 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 Difficulty sleeping 05/06/2017  . Gastroesophageal reflux disease without esophagitis 05/06/2017  . History of drug overdose 05/06/2017    Past Surgical History:  Procedure Laterality Date  . CYSTOSCOPY     x2 with stone extraction  . EXTRACORPOREAL SHOCK WAVE LITHOTRIPSY Right 06/20/2013   Procedure:  EXTRACORPOREAL SHOCK WAVE LITHOTRIPSY (ESWL);  Surgeon: Edwin Dada, MD;  Location: [facility];  Service: Urology;  Laterality: Right;  . FOOT SURGERY Left 2003  . LITHOTRIPSY    . TUBAL LIGATION  2017     OB History   No obstetric history on file.      Home Medications    Prior to Admission medications   Medication Sig Start Date End Date Taking? Authorizing Provider  FLUoxetine (PROZAC) 40 MG capsule Take 40 mg by mouth daily.   Yes [provider]  Oxcarbazepine (TRILEPTAL) 300 MG tablet Take 300 mg by mouth 2 (two) times daily.   Yes [provider]  traZODone (DESYREL) 100 MG tablet Take 100 mg by mouth at bedtime as needed.  06/26/18  Yes [provider]  HYDROcodone-acetaminophen (NORCO/VICODIN) 5-325 MG tablet Take 1 tablet by mouth every 6 (six) hours as needed for severe pain. 10/21/18   Francee Setzer, [place] N, PA-C    Family History Family History  Problem Relation Age of Onset  . Depression Mother   . Asthma Mother   . Hypertension Mother   . Mood Disorder Mother   . Heart disease Father   . Kidney disease Father   . Alcoholism Father   . Breast cancer Paternal Aunt   .  Breast cancer Maternal Grandmother   . Breast cancer Cousin   . Alcoholism Brother   . Drug abuse Brother     Social History Social History   Tobacco Use  . Smoking status: Current Some Day Smoker    Packs/day: 0.25    Types: Cigarettes  . Smokeless tobacco: Never Used  Substance Use Topics  . Alcohol use: Yes    Comment: occ  . Drug use: Not Currently     Allergies   Levofloxacin, Penicillins, and Phenergan [promethazine hcl]   Review of Systems Review of Systems  Musculoskeletal: Positive for arthralgias and joint swelling.  Skin: Positive for color change. Negative for wound.  Neurological: Negative for syncope and numbness.     Physical Exam Updated Vital Signs BP 95/60 (BP Location: Left Arm)   Pulse 60   Temp 97.8 F (36.6 C) (Oral)   Resp 16    Ht 5\' 1"  (1.549 m)   Wt 82.6 kg   LMP 10/10/2018   SpO2 98%   BMI 34.41 kg/m   Physical Exam Vitals signs and nursing note reviewed.  Constitutional:      General: She is not in acute distress.    Appearance: She is well-developed.  HENT:     Head: Normocephalic and atraumatic.  Eyes:     Conjunctiva/sclera: Conjunctivae normal.  Cardiovascular:     Rate and Rhythm: Normal rate and regular rhythm.  Pulmonary:     Effort: Pulmonary effort is normal.     Breath sounds: Normal breath sounds.  Musculoskeletal:     Comments: There is swelling present to the right elbow with yellow appearing bruising.  No obvious deformity.  Patient is unable to supinate or pronate the forearm secondary to pain in the elbow.  Wrist is nontender without deformity.  Right superior shoulder with tenderness and pain with internal and external rotation.  No deformity present. Patient has equal grip strength bilateral upper extremities.  Strong radial pulses and normal sensation to the fingers.  Neurological:     Mental Status: She is alert.  Psychiatric:        Mood and Affect: Mood normal.        Behavior: Behavior normal.      ED Treatments / Results  Labs (all labs ordered are listed, but only abnormal results are displayed) Labs Reviewed - No data to display  EKG None  Radiology Dg Shoulder Right  Result Date: 10/21/2018 CLINICAL DATA:  23 year old female with history of trauma from a fall complaining of right shoulder pain. EXAM: RIGHT SHOULDER - 2+ VIEW COMPARISON:  None. FINDINGS: There is no evidence of fracture or dislocation. There is no evidence of arthropathy or other focal bone abnormality. Soft tissues are unremarkable. IMPRESSION: Negative. Electronically Signed   By: Vinnie Langton M.D.   On: 10/21/2018 10:05   Dg Elbow Complete Right  Result Date: 10/21/2018 CLINICAL DATA:  Patient tripped and fell.  Elbow swelling. EXAM: RIGHT ELBOW - COMPLETE 3+ VIEW COMPARISON:  None.  FINDINGS: Four views study shows no gross fracture or dislocation. No fat pad elevation to suggest joint effusion. Lateral film demonstrates 2 osseous fragments anterior to the distal humerus. Although age indeterminate, these are likely either cortical avulsion fragments or mineralization in the distal biceps tendon secondary to chronic repetitive trauma. IMPRESSION: No gross fracture or dislocation. Small osseous fragments are identified in the soft tissues anterior to the distal humerus, potentially relating to cortical avulsion fragments or chronic/repetitive trauma in the distal biceps  tendon. MRI follow-up may prove helpful to further evaluate. Electronically Signed   By: Misty Stanley M.D.   On: 10/21/2018 09:45   Ct Elbow Right Wo Contrast  Result Date: 10/21/2018 CLINICAL DATA:  Right elbow pain, swelling, and bruising secondary to a fall 2 days ago. EXAM: CT OF THE LOWER RIGHT EXTREMITY WITHOUT CONTRAST TECHNIQUE: Multidetector CT imaging of the right lower extremity was performed according to the standard protocol. COMPARISON:  Radiographs dated 10/21/2018 FINDINGS: Bones/Joint/Cartilage There is a tiny bone avulsion of the tip of the coronoid process of the proximal ulna. There is second tiny calcification anterior to the capitellum in the joint space which may represent a tiny avulsed fragment is well. The distal humerus appears normal. Proximal radius appears normal. Or appreciable joint effusion. I suspect these small calcifications probably represent old tiny avulsion. Muscles and Tendons No discrete abnormality. Soft tissues Edema in the subcutaneous soft tissues posterior to the elbow and in the posterior aspect of the proximal forearm consistent with soft tissue contusion. There is also edema in the soft tissues adjacent to the medial epicondyle. Ligaments cannot be assessed on the CT scan. IMPRESSION: 1. Two small calcified fragments in the elbow joint, likely representing tiny old avulsions.  No discrete joint effusion to suggest an acute avulsion. 2. Soft tissue edema around the elbow as described consistent with soft tissue injury. Electronically Signed   By: Lorriane Shire M.D.   On: 10/21/2018 12:15    Procedures Procedures (including critical care time)  Medications Ordered in ED Medications  HYDROcodone-acetaminophen (NORCO/VICODIN) 5-325 MG per tablet 1 tablet (1 tablet Oral Given 10/21/18 1148)     Initial Impression / Assessment and Plan / ED Course  I have reviewed the triage vital signs and the nursing notes.  Pertinent labs & imaging results that were available during my care of the patient were reviewed by me and considered in my medical decision making (see chart for details).  Clinical Course as of Oct 20 1725  Sat Oct 21, 2018  1308 Discussed with Dr. Thurnell Garbe. Will place in Long arm posterior splint and refer to ortho.   [JR]    Clinical Course User Index [JR] Nadia Torr, [place] N, PA-C      Patient presenting with right elbow swelling and pain after a mechanical fall a couple of days ago.  On exam she has moderate amount of swelling to the right elbow with some bruising present.  Patient is unwilling to range her elbow due to pain.  X-ray with bony fragments present suggesting more likely chronic in nature though recommending more advanced imaging for further evaluation.  MRI imaging is not currently available in this ED at this time.  Discussed with Dr. Thurnell Garbe who recommends CT scan for further evaluation of the joint.  Discussed this with patient and she is agreeable to plan.  CT scan favoring old bony fragments in the joint.  Given patient's pain, swelling, and unwillingness to move the joint, will place in posterior splint and provide referral for outpatient Ortho follow-up for further management.  Patient is agreeable to plan.  Short course of pain medication prescribed for acute pain.  Safe for discharge.  Discussed results, findings, treatment and  follow up. Patient advised of return precautions. Patient verbalized understanding and agreed with plan.    Final Clinical Impressions(s) / ED Diagnoses   Final diagnoses:  Elbow injury, right, initial encounter    ED Discharge Orders         Ordered  HYDROcodone-acetaminophen (NORCO/VICODIN) 5-325 MG tablet  Every 6 hours PRN     10/21/18 1338           Duy Lemming, [place] N, PA-C 10/21/18 [place], [place], DO 10/24/18 1102

## 2018-11-08 ENCOUNTER — Encounter: Payer: Self-pay | Admitting: Family Medicine

## 2018-11-09 ENCOUNTER — Telehealth: Payer: Self-pay | Admitting: Family Medicine

## 2018-11-09 ENCOUNTER — Encounter: Payer: Self-pay | Admitting: Family Medicine

## 2018-11-14 ENCOUNTER — Encounter: Payer: Self-pay | Admitting: Family Medicine

## 2018-11-16 ENCOUNTER — Encounter: Payer: Self-pay | Admitting: Family Medicine

## 2018-11-20 ENCOUNTER — Encounter: Payer: Self-pay | Admitting: Family Medicine

## 2018-11-21 ENCOUNTER — Encounter: Payer: Self-pay | Admitting: Family Medicine

## 2018-11-22 ENCOUNTER — Other Ambulatory Visit: Payer: Self-pay

## 2018-11-22 ENCOUNTER — Ambulatory Visit: Payer: Medicaid Other

## 2018-11-22 DIAGNOSIS — Z20822 Contact with and (suspected) exposure to covid-19: Secondary | ICD-10-CM

## 2018-11-24 LAB — NOVEL CORONAVIRUS, NAA: SARS-CoV-2, NAA: NOT DETECTED

## 2018-12-16 ENCOUNTER — Encounter: Payer: Self-pay | Admitting: Nurse Practitioner

## 2018-12-17 ENCOUNTER — Other Ambulatory Visit: Payer: Self-pay | Admitting: Nurse Practitioner

## 2018-12-17 DIAGNOSIS — Z0189 Encounter for other specified special examinations: Secondary | ICD-10-CM

## 2018-12-18 ENCOUNTER — Encounter: Payer: Self-pay | Admitting: Nurse Practitioner

## 2018-12-20 ENCOUNTER — Encounter: Payer: Self-pay | Admitting: Nurse Practitioner

## 2018-12-20 ENCOUNTER — Ambulatory Visit (INDEPENDENT_AMBULATORY_CARE_PROVIDER_SITE_OTHER): Payer: Medicaid Other | Admitting: Nurse Practitioner

## 2018-12-20 DIAGNOSIS — R110 Nausea: Secondary | ICD-10-CM

## 2018-12-20 DIAGNOSIS — F319 Bipolar disorder, unspecified: Secondary | ICD-10-CM

## 2018-12-20 MED ORDER — ONDANSETRON HCL 4 MG PO TABS: 4.0000 mg | ORAL_TABLET | Freq: Three times a day (TID) | ORAL | 1 refills | Status: AC | PRN

## 2018-12-20 NOTE — Progress Notes (Signed)
Was being seen at [facility]. Would like a referral to a different office.

## 2018-12-20 NOTE — Progress Notes (Signed)
Virtual Visit via Telephone Note Due to national recommendations of social distancing due to Kellie Martinez 17, telehealth visit is felt to be most appropriate for this patient at this time.  I discussed the limitations, risks, security and privacy concerns of performing an evaluation and management service by telephone and the availability of in person appointments. I also discussed with the patient that there may be a patient responsible charge related to this service. The patient expressed understanding and agreed to proceed.    I connected with Kellie Martinez on 12/20/18  at   1:30 PM EST  EDT by telephone and verified that I am speaking with the correct person using two identifiers.   Consent I discussed the limitations, risks, security and privacy concerns of performing an evaluation and management service by telephone and the availability of in person appointments. I also discussed with the patient that there may be a patient responsible charge related to this service. The patient expressed understanding and agreed to proceed.   Location of Patient: Private Residence   Location of Provider: [place] participating in Telemedicine visit: Kellie Rankins FNP-BC [place]    History of Present Illness: Telemedicine visit for: Psychiatry Referral   Patient is requesting new referral for psychiatry. She states she was told she should see a psychiatrist for depression medication. However she had been seeing Dr. Darleene Cleaver and requested xanax and adderall. After he declined prescription request she asked to see another psychiatrist.  She is no longer taking prozac and only taking trazodone at this time. She does have some complaints of nausea. Aggravated by the smell of meat/chicken. Thinks she may be pregnant. I instructed her a few days ago that she could come in and have a blood test or urine test to determine pregnancy. She still has  not come for labs. I have instructed her not to take zofran if she believes she could be pregnant. Menstrual cycles are irregular.  I have sent a referral to GYN per patient request. She states she wants to know if she can have any more children since she had a tubal ligation.    Past Medical History:  Diagnosis Date  . Anemia   . Anxiety   . Asthma   . Bipolar 1 disorder ([place])   . 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  . Chronic kidney disease   . Depression   . GERD (gastroesophageal reflux disease)   . Molar pregnancy   . Renal calculus or stone    had stent/ lithotripsy in past  . Sleep apnea   . Substance abuse ([place])   . Suicide attempt ([place])    x 2    Past Surgical History:  Procedure Laterality Date  . CYSTOSCOPY     x2 with stone extraction  . EXTRACORPOREAL SHOCK WAVE LITHOTRIPSY Right 06/20/2013   Procedure: EXTRACORPOREAL SHOCK WAVE LITHOTRIPSY (ESWL);  Surgeon: Edwin Dada, MD;  Location: [facility];  Service: Urology;  Laterality: Right;  . FOOT SURGERY Left 2003  . LITHOTRIPSY    . TUBAL LIGATION  2017    Family History  Problem Relation Age of Onset  . Depression Mother   . Asthma Mother   . Hypertension Mother   . Mood Disorder Mother   . Heart disease Father   . Kidney disease Father   . Alcoholism Father   . Breast cancer Paternal Aunt   . Breast cancer Maternal Grandmother   . Breast cancer  Cousin   . Alcoholism Brother   . Drug abuse Brother     Social History   Socioeconomic History  . Marital status: Divorced    Spouse name: Not on file  . Number of children: 3  . Years of education: Not on file  . Highest education level: Not on file  Occupational History  . Not on file  Social Needs  . Financial resource strain: Not on file  . Food insecurity    Worry: Not on file    Inability: Not on file  . Transportation needs    Medical: Not on file    Non-medical: Not on file  Tobacco Use  . Smoking status: Current Some Day Smoker    Packs/day: 0.25     Types: Cigarettes  . Smokeless tobacco: Never Used  Substance and Sexual Activity  . Alcohol use: Yes    Comment: occ  . Drug use: Not Currently  . Sexual activity: Yes    Birth control/protection: I.U.D.  Lifestyle  . Physical activity    Days per week: Not on file    Minutes per session: Not on file  . Stress: Not on file  Relationships  . Social Herbalist on phone: Not on file    Gets together: Not on file    Attends religious service: Not on file    Active member of club or organization: Not on file    Attends meetings of clubs or organizations: Not on file    Relationship status: Not on file  Other Topics Concern  . Not on file  Social History Narrative  . Not on file     Observations/Objective: Awake, alert and oriented x 3   Review of Systems  Constitutional: Negative for fever, malaise/fatigue and weight loss.  HENT: Negative.  Negative for nosebleeds.   Eyes: Negative.  Negative for blurred vision, double vision and photophobia.  Respiratory: Negative.  Negative for cough and shortness of breath.   Cardiovascular: Negative.  Negative for chest pain, palpitations and leg swelling.  Gastrointestinal: Positive for nausea. Negative for abdominal pain, blood in stool, constipation, diarrhea, heartburn, melena and vomiting.  Musculoskeletal: Negative.  Negative for myalgias.  Neurological: Negative.  Negative for dizziness, focal weakness, seizures and headaches.  Psychiatric/Behavioral: Negative.  Negative for suicidal ideas.    Assessment and Plan:  Diagnoses and all orders for this visit:  Bipolar affective disorder, remission status unspecified ([place]) -     Ambulatory referral to Psychiatry  Nausea -     ondansetron (ZOFRAN) 4 MG tablet; Take 1 tablet (4 mg total) by mouth every 8 (eight) hours as needed for nausea or vomiting.     Follow Up Instructions Return if symptoms worsen or fail to improve.     I discussed the assessment and  treatment plan with the patient. The patient was provided an opportunity to ask questions and all were answered. The patient agreed with the plan and demonstrated an understanding of the instructions.   The patient was advised to call back or seek an in-person evaluation if the symptoms worsen or if the condition fails to improve as anticipated.  I provided 15 minutes of non-face-to-face time during this encounter including median intraservice time, reviewing previous notes, labs, imaging, medications and explaining diagnosis and management.  Gildardo Pounds, FNP-BC

## 2019-01-01 ENCOUNTER — Encounter: Payer: Self-pay | Admitting: Family Medicine

## 2019-01-02 ENCOUNTER — Encounter: Payer: Self-pay | Admitting: Family Medicine

## 2019-01-03 ENCOUNTER — Ambulatory Visit (INDEPENDENT_AMBULATORY_CARE_PROVIDER_SITE_OTHER): Payer: Medicaid Other | Admitting: Psychiatry

## 2019-01-03 ENCOUNTER — Other Ambulatory Visit: Payer: Self-pay

## 2019-01-03 DIAGNOSIS — F411 Generalized anxiety disorder: Secondary | ICD-10-CM

## 2019-01-03 DIAGNOSIS — F3181 Bipolar II disorder: Secondary | ICD-10-CM | POA: Insufficient documentation

## 2019-01-03 DIAGNOSIS — F902 Attention-deficit hyperactivity disorder, combined type: Secondary | ICD-10-CM | POA: Diagnosis not present

## 2019-01-03 MED ORDER — LAMOTRIGINE 25 MG PO TABS: ORAL_TABLET | ORAL | 0 refills | Status: DC

## 2019-01-03 MED ORDER — TRAZODONE HCL 150 MG PO TABS: 150.0000 mg | ORAL_TABLET | Freq: Every evening | ORAL | 0 refills | Status: DC | PRN

## 2019-01-03 MED ORDER — ESCITALOPRAM OXALATE 10 MG PO TABS: 10.0000 mg | ORAL_TABLET | Freq: Every day | ORAL | 2 refills | Status: DC

## 2019-01-03 NOTE — Progress Notes (Signed)
Psychiatric Initial Adult Assessment   Patient Identification: MAYCEL VANSCOYK MRN:  [id number] Date of Evaluation:  01/03/2019 Referral Source: [name] NP Chief Complaint:  "I am really nervous". Visit Diagnosis:    ICD-10-CM   1. GAD (generalized anxiety disorder)  F41.1   2. Attention deficit hyperactivity disorder (ADHD), combined type  F90.2   3. Bipolar 2 disorder (HCC)  F31.81   Interview was conducted using WebEx teleconferencing application (but only voice connection was established) and I verified that I was speaking with the correct person using two identifiers. I discussed the limitations of evaluation and management by telemedicine and  the availability of in person appointments. Patient expressed understanding and agreed to proceed.  History of Present Illness:  Patient is 23 yo DWF referred to [place] for treatment of mood disorder and chronic problems with focusing. Patient is not a great historian and no detailed records of past psychiatric treatments are available. She reports feeling anxious, worried "all day long", having low self esteem, insomnia, poor appetite, rapid mood fluctuations (depressed, then irritable), racing thoughts, difficulty remembering things/focusing. She denies feeling hopeless, suicidal or particularly depressed. She is only taking trazodone for insomnia but it is not very effective. She is under quite a bit of stress as she is taking care of her 3 young children (54,90,71 year old). She has a fiancee but he is of limited help to her.  Haiden reports being diagnosed with having bipolar disorder and ADHD when she was 25-9 years old. Unfortunately she does not remember medications she has been tried for either of these conditions just says that "there were many". She seems to recall names of Ritalin and Adderall. Available record also shows that she was tried on SSRIs (citalopram, fluoxetine), aripiprazole, oxcarbazepine, hydroxyzine, clonazepam. She describes her  mood swings as being very rapid - happening several times during the day. The upswings are described and increased irritability, increased energy, racing thoughts, impulsivity (no euphoria). The longest such an episode can last is less than a day and she denies ever having psychotic symptoms. She also denies having hx of prolonged depressive episodes, suicidal thoughts although she admits OD on meds impulsively out of frustration when she was 13. She did not go to the hospital then and has npo hx of inpatient psychiatric admissions. She has struggled with focusing, task completion, procrastination throughout her school days. It is unclear if she had a component of hyperactivity ("I don't know"). This was accompanied by anxiety as she felt being inadequate, low achieving. She tried to self medicate with marijuana but has not used this drug  "for a fe years". She denies abusing other street or prescription drugs. At one time (few years ago) she was also drinking "too much" but she dos not do it any more.  Associated Signs/Symptoms: Depression Symptoms:  feelings of worthlessness/guilt, difficulty concentrating, anxiety, disturbed sleep, decreased appetite, (Hypo) Manic Symptoms:  Distractibility, Irritable Mood, Labiality of Mood, Anxiety Symptoms:  Excessive Worry, Psychotic Symptoms:  None PTSD Symptoms: Negative  Past Psychiatric History: see above  Previous Psychotropic Medications: Yes   Substance Abuse History in the last 12 months:  No.  Consequences of Substance Abuse: Negative  Past Medical History:  Past Medical History:  Diagnosis Date  . Anemia   . Anxiety   . Asthma   . Bipolar 1 disorder ([place])   . 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  . Chronic kidney disease   . Depression   . GERD (gastroesophageal reflux disease)   . Molar  pregnancy   . Renal calculus or stone    had stent/ lithotripsy in past  . Sleep apnea   . Substance abuse ([place])   . Suicide attempt ([place])    x 2    Past  Surgical History:  Procedure Laterality Date  . CYSTOSCOPY     x2 with stone extraction  . EXTRACORPOREAL SHOCK WAVE LITHOTRIPSY Right 06/20/2013   Procedure: EXTRACORPOREAL SHOCK WAVE LITHOTRIPSY (ESWL);  Surgeon: Edwin Dada, MD;  Location: [facility];  Service: Urology;  Laterality: Right;  . FOOT SURGERY Left 2003  . LITHOTRIPSY    . TUBAL LIGATION  2017    Family Psychiatric History: Reviewed.  Family History:  Family History  Problem Relation Age of Onset  . Depression Mother   . Asthma Mother   . Hypertension Mother   . Mood Disorder Mother   . Heart disease Father   . Kidney disease Father   . Alcoholism Father   . Breast cancer Paternal Aunt   . Breast cancer Maternal Grandmother   . Breast cancer Cousin   . Alcoholism Brother   . Drug abuse Brother     Social History:   Social History   Socioeconomic History  . Marital status: Divorced    Spouse name: Not on file  . Number of children: 3  . Years of education: Not on file  . Highest education level: Not on file  Occupational History  . Not on file  Social Needs  . Financial resource strain: Not on file  . Food insecurity    Worry: Not on file    Inability: Not on file  . Transportation needs    Medical: Not on file    Non-medical: Not on file  Tobacco Use  . Smoking status: Current Some Day Smoker    Packs/day: 0.25    Types: Cigarettes  . Smokeless tobacco: Never Used  Substance and Sexual Activity  . Alcohol use: Yes    Comment: occ  . Drug use: Not Currently  . Sexual activity: Yes    Birth control/protection: I.U.D.  Lifestyle  . Physical activity    Days per week: Not on file    Minutes per session: Not on file  . Stress: Not on file  Relationships  . Social Herbalist on phone: Not on file    Gets together: Not on file    Attends religious service: Not on file    Active member of club or organization: Not on file    Attends meetings of clubs or organizations: Not on file     Relationship status: Not on file  Other Topics Concern  . Not on file  Social History Narrative  . Not on file     Allergies:   Allergies  Allergen Reactions  . Levofloxacin   . Penicillins Hives    No anaphylaxis  Did it involve swelling of the face/tongue/throat, SOB, or low BP? No Did it involve sudden or severe rash/hives, skin peeling, or any reaction on the inside of your mouth or nose? Yes Did you need to seek medical attention at a hospital or doctor's office? Yes When did it last happen?Childhood If all above answers are "NO", may proceed with cephalosporin use.   Marland Kitchen Phenergan [Promethazine Hcl]     Tremors, restless lef, akathesia 123XX123    Metabolic Disorder Labs: Lab Results  Component Value Date   HGBA1C 5.0 03/13/2018   No results found for: PROLACTIN Lab Results  Component Value Date   CHOL 108 03/13/2018   TRIG 45 03/13/2018   HDL 35 (L) 03/13/2018   CHOLHDL 3.1 03/13/2018   LDLCALC 64 03/13/2018   Lab Results  Component Value Date   TSH 1.170 03/13/2018    Therapeutic Level Labs: No results found for: LITHIUM No results found for: CBMZ No results found for: VALPROATE  Current Medications: Current Outpatient Medications  Medication Sig Dispense Refill  . escitalopram (LEXAPRO) 10 MG tablet Take 1 tablet (10 mg total) by mouth daily with breakfast. 30 tablet 2  . lamoTRIgine (LAMICTAL) 25 MG tablet Take 1 tablet (25 mg total) by mouth at bedtime for 14 days, THEN 2 tablets (50 mg total) at bedtime for 20 days. 54 tablet 0  . ondansetron (ZOFRAN) 4 MG tablet Take 1 tablet (4 mg total) by mouth every 8 (eight) hours as needed for nausea or vomiting. 30 tablet 1  . traZODone (DESYREL) 150 MG tablet Take 1 tablet (150 mg total) by mouth at bedtime as needed. 30 tablet 0   No current facility-administered medications for this visit.     Psychiatric Specialty Exam: Review of Systems  Gastrointestinal: Positive for nausea.   Psychiatric/Behavioral: The patient is nervous/anxious and has insomnia.   All other systems reviewed and are negative.   There were no vitals taken for this visit.There is no height or weight on file to calculate BMI.  General Appearance: NA  Eye Contact:  NA  Speech:  Clear and Coherent and Normal Rate  Volume:  Normal  Mood:  Anxious  Affect:  NA  Thought Process:  Descriptions of Associations: Circumstantial  Orientation:  Full (Time, Place, and Person)  Thought Content:  Rumination  Suicidal Thoughts:  No  Homicidal Thoughts:  No  Memory:  Immediate;   Fair Recent;   Fair Remote;   Fair  Judgement:  Fair  Insight:  Shallow  Psychomotor Activity:  NA  Concentration:  Concentration: Poor  Recall:  [employer] of Knowledge:Fair  Language: Fair  Akathisia:  Negative  Handed:  Right  AIMS (if indicated):  not done  Assets:  Desire for Improvement Housing Physical Health Resilience  ADL's:  Intact  Cognition: WNL  Sleep:  Fair   Screenings: GAD-7     Office Visit from 02/20/2018 in Primary Care at [facility]  Total GAD-7 Score  18    PHQ2-9     Office Visit from 02/20/2018 in [place] at [facility] Visit from 05/06/2017 in [place]  PHQ-2 Total Score  3  2  PHQ-9 Total Score  16  12      Assessment and Plan: 23 yo DWF referred to [place] for treatment of mood disorder and chronic problems with focusing. Patient is not a great historian and no detailed records of past psychiatric treatments are available. She reports feeling anxious, worried "all day long", having low self esteem, insomnia, poor appetite, rapid mood fluctuations (depressed, then irritable), racing thoughts, difficulty remembering things/focusing. She denies feeling hopeless, suicidal or particularly depressed. She is only taking trazodone for insomnia but it is not very effective. She is under quite a bit of stress as she is taking care of her 3 young children (24,66,36  year old). She has a fiancee but he is of limited help to her.  Jeniece reports being diagnosed with having bipolar disorder and ADHD when she was 30-6 years old. Unfortunately she does not remember medications she has been tried for either  of these conditions just says that "there were many". She seems to recall names of Ritalin and Adderall. Available record also shows that she was tried on SSRIs (citalopram, fluoxetine), aripiprazole, oxcarbazepine, hydroxyzine, clonazepam. She describes her mood swings as being very rapid - happening several times during the day. The upswings are described and increased irritability, increased energy, racing thoughts, impulsivity (no euphoria). The longest such an episode can last is less than a day and she denies ever having psychotic symptoms. She also denies having hx of prolonged depressive episodes, suicidal thoughts although she admits OD on meds impulsively out of frustration when she was 13. She did not go to the hospital then and has npo hx of inpatient psychiatric admissions. She has struggled with focusing, task completion, procrastination throughout her school days. It is unclear if she had a component of hyperactivity ("I don't know"). This was accompanied by anxiety as she felt being inadequate, low achieving. She tried to self medicate with marijuana but has not used this drug  "for a fe years". She denies abusing other street or prescription drugs. At one time (few years ago) she was also drinking "too much" but she dos not do it any more.  In summary, her symptoms although not very well described appear to be more consistent with combined ADHD than with bipolar 1 disorder - she may have a bipolar spectrum - more closely resembling bipolar 2 disorder though. She also likely has GAD. Cannabis use disorder is in full remission. She wants to have tubal ligation reversed and have more children so we need to keep that in mind when ading psychotropic  medications.  Plan: We will start with trying to stabilize her mood before addressing ADHD: start Lamictal titration and escitalopram 10 mg daily. Risks associated with Lamictal were discussed. We will increase dose of trazodone to 150 mg for sleep. Next appointment in one month. The plan was discussed with patient who had an opportunity to ask questions and these were all answered. I spend 60 minutes in voice only consultation with the patient and devoted approximately 50% of this time to explanation of diagnosis, discussion of treatment options and med education.   Stephanie Acre, MD 11/25/20208:34 AM

## 2019-01-09 ENCOUNTER — Encounter: Payer: Self-pay | Admitting: Family Medicine

## 2019-01-10 ENCOUNTER — Ambulatory Visit (INDEPENDENT_AMBULATORY_CARE_PROVIDER_SITE_OTHER): Payer: Medicaid Other | Admitting: Psychiatry

## 2019-01-10 ENCOUNTER — Other Ambulatory Visit: Payer: Self-pay

## 2019-01-10 DIAGNOSIS — F902 Attention-deficit hyperactivity disorder, combined type: Secondary | ICD-10-CM

## 2019-01-10 DIAGNOSIS — F411 Generalized anxiety disorder: Secondary | ICD-10-CM

## 2019-01-10 DIAGNOSIS — F3181 Bipolar II disorder: Secondary | ICD-10-CM | POA: Diagnosis not present

## 2019-01-10 MED ORDER — ESCITALOPRAM OXALATE 5 MG PO TABS: 5.0000 mg | ORAL_TABLET | Freq: Every day | ORAL | 0 refills | Status: DC

## 2019-01-10 NOTE — Progress Notes (Signed)
[facility] MD/PA/NP OP Progress Note  01/10/2019 1:22 PM Kellie Martinez  MRN:  [id number] Interview was conducted by phone and I verified that I was speaking with the correct person using two identifiers. I discussed the limitations of evaluation and management by telemedicine and  the availability of in person appointments. Patient expressed understanding and agreed to proceed.  Chief Complaint: Muscle cramps.  HPI: 23 yo DWF referred to [place] for treatment of mood disorder and chronic problems with focusing.  Kellie Martinez reports being diagnosed with having bipolar disorder and ADHD when she was 64-65 years old. Unfortunately she does not remember medications she has been tried for either of these conditions just says that "there were many". She seems to recall names of Ritalin and Adderall. Available record also shows that she was tried on SSRIs (citalopram, fluoxetine), aripiprazole, oxcarbazepine, hydroxyzine, clonazepam. She describes her mood swings as being very rapid - happening several times during the day. The upswings are described and increased irritability, increased energy, racing thoughts, impulsivity (no euphoria). The longest such an episode can last is less than a day and she denies ever having psychotic symptoms. She also denies having hx of prolonged depressive episodes, suicidal thoughts although she admits OD on meds impulsively out of frustration when she was 13. She did not go to the hospital then and has npo hx of inpatient psychiatric admissions. She has struggled with focusing, task completion, procrastination throughout her school days. It is unclear if she had a component of hyperactivity ("I don't know"). This was accompanied by anxiety as she felt being inadequate, low achieving. She tried to self medicate with marijuana but has not used this drug  "for a fe years". She denies abusing other street or prescription drugs. At one time (few years ago) she was also drinking "too much" but she dos  not do it any more. I suspect she is more likely to have combined ADHD than with bipolar 1 disorder - she may have a bipolar spectrum - more closely resembling bipolar 2 disorder though. She also likely has GAD. Cannabis use disorder is in full remission. We started Lamictal titration and escitalopram 10 mg daily a week ago. She called reporting cramps in both legs and arms. It I possible these are side effects of escitalopram. She was initially sedated but this has resolved (again likely due to Lexapro.   Visit Diagnosis:    ICD-10-CM   1. GAD (generalized anxiety disorder)  F41.1   2. Attention deficit hyperactivity disorder (ADHD), combined type  F90.2   3. Bipolar 2 disorder (HCC)  F31.81     Past Psychiatric History: Please see intake H&P.  Past Medical History:  Past Medical History:  Diagnosis Date  . Anemia   . Anxiety   . Asthma   . Bipolar 1 disorder ([place])   . 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  . Chronic kidney disease   . Depression   . GERD (gastroesophageal reflux disease)   . Molar pregnancy   . Renal calculus or stone    had stent/ lithotripsy in past  . Sleep apnea   . Substance abuse ([place])   . Suicide attempt ([place])    x 2    Past Surgical History:  Procedure Laterality Date  . CYSTOSCOPY     x2 with stone extraction  . EXTRACORPOREAL SHOCK WAVE LITHOTRIPSY Right 06/20/2013   Procedure: EXTRACORPOREAL SHOCK WAVE LITHOTRIPSY (ESWL);  Surgeon: Edwin Dada, MD;  Location: [facility];  Service: Urology;  Laterality: Right;  .  FOOT SURGERY Left 2003  . LITHOTRIPSY    . TUBAL LIGATION  2017    Family Psychiatric History: Reviewed.  Family History:  Family History  Problem Relation Age of Onset  . Depression Mother   . Asthma Mother   . Hypertension Mother   . Mood Disorder Mother   . Heart disease Father   . Kidney disease Father   . Alcoholism Father   . Breast cancer Paternal Aunt   . Breast cancer Maternal Grandmother   . Breast cancer Cousin   . Alcoholism  Brother   . Drug abuse Brother     Social History:  Social History   Socioeconomic History  . Marital status: Divorced    Spouse name: Not on file  . Number of children: 3  . Years of education: Not on file  . Highest education level: Not on file  Occupational History  . Not on file  Social Needs  . Financial resource strain: Not on file  . Food insecurity    Worry: Not on file    Inability: Not on file  . Transportation needs    Medical: Not on file    Non-medical: Not on file  Tobacco Use  . Smoking status: Current Some Day Smoker    Packs/day: 0.25    Types: Cigarettes  . Smokeless tobacco: Never Used  Substance and Sexual Activity  . Alcohol use: Yes    Comment: occ  . Drug use: Not Currently  . Sexual activity: Yes    Birth control/protection: I.U.D.  Lifestyle  . Physical activity    Days per week: Not on file    Minutes per session: Not on file  . Stress: Not on file  Relationships  . Social Herbalist on phone: Not on file    Gets together: Not on file    Attends religious service: Not on file    Active member of club or organization: Not on file    Attends meetings of clubs or organizations: Not on file    Relationship status: Not on file  Other Topics Concern  . Not on file  Social History Narrative  . Not on file    Allergies:  Allergies  Allergen Reactions  . Levofloxacin   . Penicillins Hives    No anaphylaxis  Did it involve swelling of the face/tongue/throat, SOB, or low BP? No Did it involve sudden or severe rash/hives, skin peeling, or any reaction on the inside of your mouth or nose? Yes Did you need to seek medical attention at a hospital or doctor's office? Yes When did it last happen?Childhood If all above answers are "NO", may proceed with cephalosporin use.   Marland Kitchen Phenergan [Promethazine Hcl]     Tremors, restless lef, akathesia 123XX123    Metabolic Disorder Labs: Lab Results  Component Value Date   HGBA1C  5.0 03/13/2018   No results found for: PROLACTIN Lab Results  Component Value Date   CHOL 108 03/13/2018   TRIG 45 03/13/2018   HDL 35 (L) 03/13/2018   CHOLHDL 3.1 03/13/2018   [place] 64 03/13/2018   Lab Results  Component Value Date   TSH 1.170 03/13/2018    Therapeutic Level Labs: No results found for: LITHIUM No results found for: VALPROATE No components found for:  CBMZ  Current Medications: Current Outpatient Medications  Medication Sig Dispense Refill  . escitalopram (LEXAPRO) 10 MG tablet Take 1 tablet (10 mg total) by mouth daily with breakfast. 30  tablet 2  . escitalopram (LEXAPRO) 5 MG tablet Take 1 tablet (5 mg total) by mouth daily for 14 days. After 2 weeks start taking 10 mg tablets. 14 tablet 0  . lamoTRIgine (LAMICTAL) 25 MG tablet Take 1 tablet (25 mg total) by mouth at bedtime for 14 days, THEN 2 tablets (50 mg total) at bedtime for 20 days. 54 tablet 0  . ondansetron (ZOFRAN) 4 MG tablet Take 1 tablet (4 mg total) by mouth every 8 (eight) hours as needed for nausea or vomiting. 30 tablet 1  . traZODone (DESYREL) 150 MG tablet Take 1 tablet (150 mg total) by mouth at bedtime as needed. 30 tablet 0   No current facility-administered medications for this visit.      Psychiatric Specialty Exam: Review of Systems  Musculoskeletal: Positive for myalgias.  Psychiatric/Behavioral: The patient is nervous/anxious.   All other systems reviewed and are negative.   There were no vitals taken for this visit.There is no height or weight on file to calculate BMI.  General Appearance: NA  Eye Contact:  NA  Speech:  Clear and Coherent and Normal Rate  Volume:  Normal  Mood:  Anxious  Affect:  NA  Thought Process:  Descriptions of Associations: Circumstantial  Orientation:  Full (Time, Place, and Person)  Thought Content: Logical   Suicidal Thoughts:  No  Homicidal Thoughts:  No  Memory:  Immediate;   Fair Recent;   Fair Remote;   Fair  Judgement:  Fair   Insight:  Fair  Psychomotor Activity:  NA  Concentration:  Concentration: Poor  Recall:  [employer] of Knowledge: Fair  Language: Fair  Akathisia:  Negative  Handed:  Right  AIMS (if indicated): not done  Assets:  Desire for Improvement Financial Resources/Insurance Housing Resilience  ADL's:  Intact  Cognition: WNL  Sleep:  Fair   Screenings: GAD-7     Office Visit from 02/20/2018 in Primary Care at [facility]  Total GAD-7 Score  18    PHQ2-9     Office Visit from 02/20/2018 in [place] at [facility] Visit from 05/06/2017 in [place]  PHQ-2 Total Score  3  2  PHQ-9 Total Score  16  12       Assessment and Plan: 23 yo DWF referred to [place] for treatment of mood disorder and chronic problems with focusing.  Kellie Martinez reports being diagnosed with having bipolar disorder and ADHD when she was 3-27 years old. Unfortunately she does not remember medications she has been tried for either of these conditions just says that "there were many". She seems to recall names of Ritalin and Adderall. Available record also shows that she was tried on SSRIs (citalopram, fluoxetine), aripiprazole, oxcarbazepine, hydroxyzine, clonazepam. She describes her mood swings as being very rapid - happening several times during the day. The upswings are described and increased irritability, increased energy, racing thoughts, impulsivity (no euphoria). The longest such an episode can last is less than a day and she denies ever having psychotic symptoms. She also denies having hx of prolonged depressive episodes, suicidal thoughts although she admits OD on meds impulsively out of frustration when she was 13. She did not go to the hospital then and has npo hx of inpatient psychiatric admissions. She has struggled with focusing, task completion, procrastination throughout her school days. It is unclear if she had a component of hyperactivity ("I don't know"). This was accompanied  by anxiety as she felt being inadequate, low  achieving. She tried to self medicate with marijuana but has not used this drug  "for a fe years". She denies abusing other street or prescription drugs. At one time (few years ago) she was also drinking "too much" but she dos not do it any more. I suspect she is more likely to have combined ADHD than with bipolar 1 disorder - she may have a bipolar spectrum - more closely resembling bipolar 2 disorder though. She also likely has GAD. Cannabis use disorder is in full remission. We started Lamictal titration and escitalopram 10 mg daily a week ago. She called reporting cramps in both legs and arms. It I possible these are side effects of escitalopram. She was initially sedated but this has resolved (again likely due to Lexapro.   Plan: We will decrease escitalopram to 5 mg for two weeks then if craps have resolved increase to 10 mg daily. Lamictal will continue unchanged. Next appointment in 3 weeks. Trazodone 150 mg for sleep. Next appointment in one month. The plan was discussed with patient who had an opportunity to ask questions and these were all answered. I spend 15 minutes in voice only consultation with the patient.    Stephanie Acre, MD 01/10/2019, 1:22 PM

## 2019-01-11 ENCOUNTER — Encounter: Payer: Self-pay | Admitting: Family Medicine

## 2019-01-12 ENCOUNTER — Telehealth: Payer: Self-pay

## 2019-01-12 ENCOUNTER — Other Ambulatory Visit: Payer: Self-pay | Admitting: Nurse Practitioner

## 2019-01-12 ENCOUNTER — Encounter: Payer: Self-pay | Admitting: Family Medicine

## 2019-01-12 DIAGNOSIS — G8929 Other chronic pain: Secondary | ICD-10-CM

## 2019-01-12 NOTE — Telephone Encounter (Signed)
Patient called in stating that she received a message that she had an upcoming appointment & wanted to know the details. Informed her that she had an appointment on 01/15/2019 with OBGYN.  Patient then asks what she is supposed to do about her back pain & her losing her voice. I let patient know that we are not currently open on Fridays but she was welcome to go to an urgent care. She states that she would like to make an appointment with her provider. I let her know that the covering provider that she saw Raul Del) is only here on Wednesdays & that she did not have openings this upcoming Wednesday since she is only working half a day. Patient said that that is nonsense. I offered her an appointment the following week & she said that that was too far out. I let her know that the covering providers prefer to keep patients with the same covering provider for consistency & she said that she would "change that" & hung up the phone.

## 2019-01-15 ENCOUNTER — Ambulatory Visit
Admission: EM | Admit: 2019-01-15 | Discharge: 2019-01-15 | Disposition: A | Discharge status: Home / Self Care | Payer: Medicaid Other | Attending: Emergency Medicine | Admitting: Emergency Medicine

## 2019-01-15 ENCOUNTER — Encounter: Payer: Self-pay | Admitting: Family Medicine

## 2019-01-15 ENCOUNTER — Telehealth: Payer: Self-pay | Admitting: Emergency Medicine

## 2019-01-15 ENCOUNTER — Ambulatory Visit: Payer: Medicaid Other | Admitting: Obstetrics & Gynecology

## 2019-01-15 ENCOUNTER — Encounter: Payer: Self-pay | Admitting: Obstetrics & Gynecology

## 2019-01-15 ENCOUNTER — Other Ambulatory Visit: Payer: Self-pay

## 2019-01-15 VITALS — BP 95/63 | HR 67 | Ht 61.0 in | Wt 188.0 lb

## 2019-01-15 DIAGNOSIS — Z20822 Contact with and (suspected) exposure to covid-19: Secondary | ICD-10-CM

## 2019-01-15 DIAGNOSIS — Z20828 Contact with and (suspected) exposure to other viral communicable diseases: Secondary | ICD-10-CM | POA: Diagnosis not present

## 2019-01-15 DIAGNOSIS — R350 Frequency of micturition: Secondary | ICD-10-CM | POA: Diagnosis not present

## 2019-01-15 DIAGNOSIS — Z310 Encounter for reversal of previous sterilization: Secondary | ICD-10-CM

## 2019-01-15 DIAGNOSIS — Z3202 Encounter for pregnancy test, result negative: Secondary | ICD-10-CM | POA: Diagnosis not present

## 2019-01-15 DIAGNOSIS — J069 Acute upper respiratory infection, unspecified: Secondary | ICD-10-CM

## 2019-01-15 DIAGNOSIS — Z319 Encounter for procreative management, unspecified: Secondary | ICD-10-CM

## 2019-01-15 LAB — POCT URINALYSIS DIPSTICK OB
Appearance: 1
Blood, UA: 3
Glucose, UA: NEGATIVE
Ketones, UA: NEGATIVE
POC,PROTEIN,UA: NEGATIVE

## 2019-01-15 LAB — POCT URINE PREGNANCY: Preg Test, Ur: NEGATIVE

## 2019-01-15 MED ORDER — FLUTICASONE PROPIONATE 50 MCG/ACT NA SUSP: 2.0000 | Freq: Every day | NASAL | 0 refills | Status: DC

## 2019-01-15 MED ORDER — ALBUTEROL SULFATE HFA 108 (90 BASE) MCG/ACT IN AERS: 1.0000 | INHALATION_SPRAY | Freq: Four times a day (QID) | RESPIRATORY_TRACT | 0 refills | Status: DC | PRN

## 2019-01-15 MED ORDER — CETIRIZINE-PSEUDOEPHEDRINE ER 5-120 MG PO TB12: 1.0000 | ORAL_TABLET | Freq: Every day | ORAL | 0 refills | Status: DC

## 2019-01-15 MED ORDER — BENZONATATE 100 MG PO CAPS: 100.0000 mg | ORAL_CAPSULE | Freq: Three times a day (TID) | ORAL | 0 refills | Status: DC

## 2019-01-15 MED ORDER — GUAIFENESIN ER 600 MG PO TB12: 600.0000 mg | ORAL_TABLET | Freq: Two times a day (BID) | ORAL | 0 refills | Status: DC | PRN

## 2019-01-15 NOTE — Telephone Encounter (Signed)
Pt requests additional cough medication

## 2019-01-15 NOTE — ED Provider Notes (Addendum)
[place]   [id number] 01/15/19 Arrival Time: [id number]   CC: COVID symptoms; COVID test  SUBJECTIVE: History from: patient.  Kellie Martinez is a 23 y.o. female who presents for COVID testing.  Also complains of hoarseness and dry cough x 3 days.  Complains of possible secondary exposure to husband's cowork.  Denies recent travel.  Denies aggravating or alleviating symptoms.  Denies previous COVID infection.   Denies fever, chills, fatigue, nasal congestion, rhinorrhea, SOB, wheezing, chest pain, nausea, vomiting, changes in bowel or bladder habits.     ROS: As per HPI.  All other pertinent ROS negative.     Past Medical History:  Diagnosis Date  . Anemia   . Anxiety   . Asthma   . Bipolar 1 disorder ([place])   . 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  . Chronic kidney disease   . Depression   . GERD (gastroesophageal reflux disease)   . Molar pregnancy   . Renal calculus or stone    had stent/ lithotripsy in past  . Sleep apnea   . Substance abuse ([place])   . Suicide attempt ([place])    x 2   Past Surgical History:  Procedure Laterality Date  . CYSTOSCOPY     x2 with stone extraction  . EXTRACORPOREAL SHOCK WAVE LITHOTRIPSY Right 06/20/2013   Procedure: EXTRACORPOREAL SHOCK WAVE LITHOTRIPSY (ESWL);  Surgeon: Edwin Dada, MD;  Location: [facility];  Service: Urology;  Laterality: Right;  . FOOT SURGERY Left 2003  . LITHOTRIPSY    . TUBAL LIGATION  2017   Allergies  Allergen Reactions  . Levofloxacin   . Penicillins Hives    No anaphylaxis  Did it involve swelling of the face/tongue/throat, SOB, or low BP? No Did it involve sudden or severe rash/hives, skin peeling, or any reaction on the inside of your mouth or nose? Yes Did you need to seek medical attention at a hospital or doctor's office? Yes When did it last happen?Childhood If all above answers are "NO", may proceed with cephalosporin use.   Marland Kitchen Phenergan [Promethazine Hcl]     Tremors, restless lef, akathesia  01-26-2018   No current facility-administered medications on file prior to encounter.    Current Outpatient Medications on File Prior to Encounter  Medication Sig Dispense Refill  . escitalopram (LEXAPRO) 10 MG tablet Take 1 tablet (10 mg total) by mouth daily with breakfast. 30 tablet 2  . escitalopram (LEXAPRO) 5 MG tablet Take 1 tablet (5 mg total) by mouth daily for 14 days. After 2 weeks start taking 10 mg tablets. 14 tablet 0  . lamoTRIgine (LAMICTAL) 25 MG tablet Take 1 tablet (25 mg total) by mouth at bedtime for 14 days, THEN 2 tablets (50 mg total) at bedtime for 20 days. 54 tablet 0  . ondansetron (ZOFRAN) 4 MG tablet Take 1 tablet (4 mg total) by mouth every 8 (eight) hours as needed for nausea or vomiting. 30 tablet 1  . traZODone (DESYREL) 150 MG tablet Take 1 tablet (150 mg total) by mouth at bedtime as needed. 30 tablet 0   Social History   Socioeconomic History  . Marital status: Divorced    Spouse name: Not on file  . Number of children: 3  . Years of education: Not on file  . Highest education level: Not on file  Occupational History  . Not on file  Social Needs  . Financial resource strain: Not on file  . Food insecurity    Worry: Not on file  Inability: Not on file  . Transportation needs    Medical: Not on file    Non-medical: Not on file  Tobacco Use  . Smoking status: Current Some Day Smoker    Packs/day: 0.25    Types: Cigarettes  . Smokeless tobacco: Never Used  Substance and Sexual Activity  . Alcohol use: Yes    Comment: occ  . Drug use: Not Currently  . Sexual activity: Yes    Birth control/protection: I.U.D.  Lifestyle  . Physical activity    Days per week: Not on file    Minutes per session: Not on file  . Stress: Not on file  Relationships  . Social Herbalist on phone: Not on file    Gets together: Not on file    Attends religious service: Not on file    Active member of club or organization: Not on file    Attends  meetings of clubs or organizations: Not on file    Relationship status: Not on file  . Intimate partner violence    Fear of current or ex partner: Not on file    Emotionally abused: Not on file    Physically abused: Not on file    Forced sexual activity: Not on file  Other Topics Concern  . Not on file  Social History Narrative  . Not on file   Family History  Problem Relation Age of Onset  . Depression Mother   . Asthma Mother   . Hypertension Mother   . Mood Disorder Mother   . Heart disease Father   . Kidney disease Father   . Alcoholism Father   . Breast cancer Paternal Aunt   . Breast cancer Maternal Grandmother   . Breast cancer Cousin   . Alcoholism Brother   . Drug abuse Brother     OBJECTIVE:  Vitals:   01/15/19 1210  BP: 135/74  Pulse: 61  Resp: 16  Temp: 98.7 F (37.1 C)  TempSrc: Oral  SpO2: 99%     General appearance: alert; appears fatigued, but nontoxic; speaking in full sentences and tolerating own secretions HEENT: NCAT; Ears: EACs clear, TMs pearly gray; Eyes: PERRL.  EOM grossly intact. Nose: nares patent without rhinorrhea, Throat: oropharynx clear, tonsils non erythematous or enlarged, uvula midline  Neck: supple without LAD Lungs: unlabored respirations, symmetrical air entry; cough: absent; no respiratory distress; CTAB Heart: regular rate and rhythm.   Skin: warm and dry Psychological: alert and cooperative; normal mood and affect  ASSESSMENT & PLAN:  1. Exposure to COVID-19 virus   2. Encounter for laboratory testing for COVID-19 virus   3. Suspected COVID-19 virus infection   4. Viral URI with cough     Meds ordered this encounter  Medications  . cetirizine-pseudoephedrine (ZYRTEC-D) 5-120 MG tablet    Sig: Take 1 tablet by mouth daily.    Dispense:  30 tablet    Refill:  0    Order Specific Question:   Supervising Provider    Answer:   Raylene Everts [id number]  . fluticasone (FLONASE) 50 MCG/ACT nasal spray    Sig: Place  2 sprays into both nostrils daily.    Dispense:  16 g    Refill:  0    Order Specific Question:   Supervising Provider    Answer:   Raylene Everts [id number]  . benzonatate (TESSALON) 100 MG capsule    Sig: Take 1 capsule (100 mg total) by mouth every 8 (eight) hours.  Dispense:  21 capsule    Refill:  0    Order Specific Question:   Supervising Provider    Answer:   Raylene Everts [id number]  . albuterol (VENTOLIN HFA) 108 (90 Base) MCG/ACT inhaler    Sig: Inhale 1-2 puffs into the lungs every 6 (six) hours as needed for wheezing or shortness of breath.    Dispense:  18 g    Refill:  0    Order Specific Question:   Supervising Provider    Answer:   Raylene Everts [id number]   COVID testing ordered.  It will take between 5-7 days for test results.  Someone will contact you regarding abnormal results.    In the meantime: You should remain isolated in your home for 10 days from symptom onset AND greater than 72 hours after symptoms resolution (absence of fever without the use of fever-reducing medication and improvement in respiratory symptoms), whichever is longer Get plenty of rest and push fluids Use OTC zyrtec for nasal congestion, runny nose, and/or sore throat Use OTC flonase for nasal congestion and runny nose Tessalon prescribed for cough Albuterol inhaler prescribed.  Use as needed for shortness of breath and/or wheezing Use medications daily for symptom relief Use OTC medications like ibuprofen or tylenol as needed fever or pain Call or go to the ED if you have any new or worsening symptoms such as fever, worsening cough, shortness of breath, chest tightness, chest pain, turning blue, changes in mental status, etc...   Reviewed expectations re: course of current medical issues. Questions answered. Outlined signs and symptoms indicating need for more acute intervention. Patient verbalized understanding. After Visit Summary given.         Lestine Box,  PA-C 01/15/19 [place], [place], PA-C 01/15/19 1312

## 2019-01-15 NOTE — ED Triage Notes (Signed)
Pt presents to [facility] stating her husband was exposed to covid positive person at work. Pt states she is having cough and cold chills x3 days.

## 2019-01-15 NOTE — Addendum Note (Signed)
Addended by: Diona Fanti A on: 01/15/2019 02:55 PM   Modules accepted: Orders

## 2019-01-15 NOTE — Progress Notes (Signed)
Chief Complaint  Patient presents with  . consultation ?tubes untied    miscarriage Oct.      23 y.o. No obstetric history on file. Patient's last menstrual period was 12/12/2018. The current method of family planning is tubal ligation.  Outpatient Encounter Medications as of 01/15/2019  Medication Sig  . albuterol (VENTOLIN HFA) 108 (90 Base) MCG/ACT inhaler Inhale 1-2 puffs into the lungs every 6 (six) hours as needed for wheezing or shortness of breath.  . escitalopram (LEXAPRO) 10 MG tablet Take 1 tablet (10 mg total) by mouth daily with breakfast.  . escitalopram (LEXAPRO) 5 MG tablet Take 1 tablet (5 mg total) by mouth daily for 14 days. After 2 weeks start taking 10 mg tablets.  . lamoTRIgine (LAMICTAL) 25 MG tablet Take 1 tablet (25 mg total) by mouth at bedtime for 14 days, THEN 2 tablets (50 mg total) at bedtime for 20 days.  . ondansetron (ZOFRAN) 4 MG tablet Take 1 tablet (4 mg total) by mouth every 8 (eight) hours as needed for nausea or vomiting.  . traZODone (DESYREL) 150 MG tablet Take 1 tablet (150 mg total) by mouth at bedtime as needed.  . benzonatate (TESSALON) 100 MG capsule Take 1 capsule (100 mg total) by mouth every 8 (eight) hours. (Patient not taking: Reported on 01/15/2019)  . cetirizine-pseudoephedrine (ZYRTEC-D) 5-120 MG tablet Take 1 tablet by mouth daily. (Patient not taking: Reported on 01/15/2019)  . fluticasone (FLONASE) 50 MCG/ACT nasal spray Place 2 sprays into both nostrils daily. (Patient not taking: Reported on 01/15/2019)   No facility-administered encounter medications on file as of 01/15/2019.     Subjective Pt wants to be pregnant Wants test to see" if tubes have become untied" [facility]shie clips [facility] Past Medical History:  Diagnosis Date  . Anemia   . Anxiety   . Asthma   . Bipolar 1 disorder ([place])   . 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  . Chronic kidney disease   . Depression   . GERD (gastroesophageal reflux disease)   . Molar  pregnancy   . Renal calculus or stone    had stent/ lithotripsy in past  . Sleep apnea   . Substance abuse ([place])   . Suicide attempt ([place])    x 2    Past Surgical History:  Procedure Laterality Date  . CYSTOSCOPY     x2 with stone extraction  . EXTRACORPOREAL SHOCK WAVE LITHOTRIPSY Right 06/20/2013   Procedure: EXTRACORPOREAL SHOCK WAVE LITHOTRIPSY (ESWL);  Surgeon: Edwin Dada, MD;  Location: [facility];  Service: Urology;  Laterality: Right;  . FOOT SURGERY Left 2003  . LITHOTRIPSY    . TUBAL LIGATION  2017    OB History   No obstetric history on file.     Allergies  Allergen Reactions  . Levofloxacin   . Penicillins Hives    No anaphylaxis  Did it involve swelling of the face/tongue/throat, SOB, or low BP? No Did it involve sudden or severe rash/hives, skin peeling, or any reaction on the inside of your mouth or nose? Yes Did you need to seek medical attention at a hospital or doctor's office? Yes When did it last happen?Childhood If all above answers are "NO", may proceed with cephalosporin use.   Marland Kitchen Phenergan [Promethazine Hcl]     Tremors, restless lef, akathesia 01-26-2018    Social History   Socioeconomic History  . Marital status: Divorced    Spouse name: Not on file  .  Number of children: 3  . Years of education: Not on file  . Highest education level: Not on file  Occupational History  . Not on file  Social Needs  . Financial resource strain: Not on file  . Food insecurity    Worry: Not on file    Inability: Not on file  . Transportation needs    Medical: Not on file    Non-medical: Not on file  Tobacco Use  . Smoking status: Current Some Day Smoker    Packs/day: 0.25    Types: Cigarettes  . Smokeless tobacco: Never Used  Substance and Sexual Activity  . Alcohol use: Yes    Comment: occ  . Drug use: Not Currently  . Sexual activity: Yes    Birth control/protection: Surgical  Lifestyle  . Physical activity    Days per week: Not on  file    Minutes per session: Not on file  . Stress: Not on file  Relationships  . Social Herbalist on phone: Not on file    Gets together: Not on file    Attends religious service: Not on file    Active member of club or organization: Not on file    Attends meetings of clubs or organizations: Not on file    Relationship status: Not on file  Other Topics Concern  . Not on file  Social History Narrative  . Not on file    Family History  Problem Relation Age of Onset  . Depression Mother   . Asthma Mother   . Hypertension Mother   . Mood Disorder Mother   . Heart disease Father   . Kidney disease Father   . Alcoholism Father   . Breast cancer Paternal Aunt   . Breast cancer Maternal Grandmother   . Breast cancer Cousin   . Alcoholism Brother   . Drug abuse Brother     Medications:       Current Outpatient Medications:  .  albuterol (VENTOLIN HFA) 108 (90 Base) MCG/ACT inhaler, Inhale 1-2 puffs into the lungs every 6 (six) hours as needed for wheezing or shortness of breath., Disp: 18 g, Rfl: 0 .  escitalopram (LEXAPRO) 10 MG tablet, Take 1 tablet (10 mg total) by mouth daily with breakfast., Disp: 30 tablet, Rfl: 2 .  escitalopram (LEXAPRO) 5 MG tablet, Take 1 tablet (5 mg total) by mouth daily for 14 days. After 2 weeks start taking 10 mg tablets., Disp: 14 tablet, Rfl: 0 .  lamoTRIgine (LAMICTAL) 25 MG tablet, Take 1 tablet (25 mg total) by mouth at bedtime for 14 days, THEN 2 tablets (50 mg total) at bedtime for 20 days., Disp: 54 tablet, Rfl: 0 .  ondansetron (ZOFRAN) 4 MG tablet, Take 1 tablet (4 mg total) by mouth every 8 (eight) hours as needed for nausea or vomiting., Disp: 30 tablet, Rfl: 1 .  traZODone (DESYREL) 150 MG tablet, Take 1 tablet (150 mg total) by mouth at bedtime as needed., Disp: 30 tablet, Rfl: 0 .  benzonatate (TESSALON) 100 MG capsule, Take 1 capsule (100 mg total) by mouth every 8 (eight) hours. (Patient not taking: Reported on 01/15/2019),  Disp: 21 capsule, Rfl: 0 .  cetirizine-pseudoephedrine (ZYRTEC-D) 5-120 MG tablet, Take 1 tablet by mouth daily. (Patient not taking: Reported on 01/15/2019), Disp: 30 tablet, Rfl: 0 .  fluticasone (FLONASE) 50 MCG/ACT nasal spray, Place 2 sprays into both nostrils daily. (Patient not taking: Reported on 01/15/2019), Disp: 16 g, Rfl: 0  Objective Blood pressure 95/63, pulse 67, height 5\' 1"  (1.549 m), weight 188 lb (85.3 kg), last menstrual period 12/12/2018.  Gen WDWN NAD  Pertinent ROS No burning with urination, frequency or urgency No nausea, vomiting or diarrhea Nor fever chills or other constitutional symptoms   Labs or studies Negative UPT    Impression Diagnoses this Encounter::   ICD-10-CM   1. Patient desires pregnancy, S/P BTL  Z31.9   2. Frequency of urination  R35.0 POC Urinalysis Dipstick OB  3. Pregnancy test negative  Z32.02 POCT urine pregnancy    Established relevant diagnosis(es):   Plan/Recommendations: No orders of the defined types were placed in this encounter.   Labs or Scans Ordered: Orders Placed This Encounter  Procedures  . POC Urinalysis Dipstick OB  . POCT urine pregnancy    Management:: >pt to seek REI consultation at [facility] >will need to obtain op report etc for appointment  Follow up No follow-ups on file.        Face to face time:  20 minutes  Greater than 50% of the visit time was spent in counseling and coordination of care with the patient.  The summary and outline of the counseling and care coordination is summarized in the note above.   All questions were answered.

## 2019-01-15 NOTE — Discharge Instructions (Signed)
COVID testing ordered.  It will take between 5-7 days for test results.  Someone will contact you regarding abnormal results.    In the meantime: You should remain isolated in your home for 10 days from symptom onset AND greater than 72 hours after symptoms resolution (absence of fever without the use of fever-reducing medication and improvement in respiratory symptoms), whichever is longer Get plenty of rest and push fluids Use OTC zyrtec for nasal congestion, runny nose, and/or sore throat Use OTC flonase for nasal congestion and runny nose Use medications daily for symptom relief Use OTC medications like ibuprofen or tylenol as needed fever or pain Call or go to the ED if you have any new or worsening symptoms such as fever, worsening cough, shortness of breath, chest tightness, chest pain, turning blue, changes in mental status, etc...  

## 2019-01-16 ENCOUNTER — Encounter: Payer: Self-pay | Admitting: Family Medicine

## 2019-01-17 ENCOUNTER — Encounter: Payer: Self-pay | Admitting: Family Medicine

## 2019-01-17 LAB — NOVEL CORONAVIRUS, NAA: SARS-CoV-2, NAA: NOT DETECTED

## 2019-01-18 ENCOUNTER — Telehealth: Payer: Self-pay | Admitting: Obstetrics & Gynecology

## 2019-01-18 LAB — URINE CULTURE

## 2019-01-18 LAB — SPECIMEN STATUS REPORT

## 2019-01-18 MED ORDER — SULFAMETHOXAZOLE-TRIMETHOPRIM 800-160 MG PO TABS: 1.0000 | ORAL_TABLET | Freq: Two times a day (BID) | ORAL | 0 refills | Status: DC

## 2019-01-19 ENCOUNTER — Encounter: Payer: Self-pay | Admitting: Nurse Practitioner

## 2019-01-20 ENCOUNTER — Other Ambulatory Visit: Payer: Self-pay | Admitting: Nurse Practitioner

## 2019-01-20 DIAGNOSIS — Q619 Cystic kidney disease, unspecified: Secondary | ICD-10-CM

## 2019-01-22 NOTE — Progress Notes (Addendum)
Patient notified of appointment details via [employer].

## 2019-01-22 NOTE — Progress Notes (Signed)
Will initiate PA on [employer]'s website.

## 2019-01-22 NOTE — Progress Notes (Addendum)
Prior Authorization approved. 01/22/2019-07/21/2019. Authorization number: [id number]  Called scheduling to get an appointment made. Patient is scheduled at [facility] on 01/25/2019 @ 1:15 pm. Must arrive with a full bladder.

## 2019-01-25 ENCOUNTER — Ambulatory Visit (HOSPITAL_COMMUNITY): Payer: Medicaid Other

## 2019-01-29 ENCOUNTER — Ambulatory Visit (HOSPITAL_COMMUNITY): Payer: Medicaid Other

## 2019-02-03 ENCOUNTER — Emergency Department (HOSPITAL_COMMUNITY)
Admission: EM | Admit: 2019-02-03 | Discharge: 2019-02-03 | Disposition: A | Discharge status: Home / Self Care | Payer: Medicaid Other | Attending: Emergency Medicine | Admitting: Emergency Medicine

## 2019-02-03 ENCOUNTER — Emergency Department (HOSPITAL_COMMUNITY): Payer: Medicaid Other

## 2019-02-03 ENCOUNTER — Other Ambulatory Visit: Payer: Self-pay

## 2019-02-03 ENCOUNTER — Encounter (HOSPITAL_COMMUNITY): Payer: Self-pay | Admitting: Emergency Medicine

## 2019-02-03 DIAGNOSIS — Z79899 Other long term (current) drug therapy: Secondary | ICD-10-CM | POA: Insufficient documentation

## 2019-02-03 DIAGNOSIS — S3992XA Unspecified injury of lower back, initial encounter: Secondary | ICD-10-CM | POA: Diagnosis not present

## 2019-02-03 DIAGNOSIS — W19XXXA Unspecified fall, initial encounter: Secondary | ICD-10-CM

## 2019-02-03 DIAGNOSIS — F17210 Nicotine dependence, cigarettes, uncomplicated: Secondary | ICD-10-CM | POA: Diagnosis not present

## 2019-02-03 DIAGNOSIS — M546 Pain in thoracic spine: Secondary | ICD-10-CM | POA: Diagnosis not present

## 2019-02-03 DIAGNOSIS — W010XXA Fall on same level from slipping, tripping and stumbling without subsequent striking against object, initial encounter: Secondary | ICD-10-CM | POA: Diagnosis not present

## 2019-02-03 DIAGNOSIS — M545 Low back pain: Secondary | ICD-10-CM | POA: Diagnosis not present

## 2019-02-03 DIAGNOSIS — M542 Cervicalgia: Secondary | ICD-10-CM | POA: Diagnosis not present

## 2019-02-03 DIAGNOSIS — M549 Dorsalgia, unspecified: Secondary | ICD-10-CM | POA: Diagnosis not present

## 2019-02-03 LAB — PREGNANCY, URINE: Preg Test, Ur: NEGATIVE

## 2019-02-03 IMAGING — DX DG LUMBAR SPINE 2-3V
3 series · 3 of 3 positions shown · non-contrast
Comparison: None.

CLINICAL DATA: Fall.  Back pain.

EXAM:
LUMBAR SPINE - 2-3 VIEW

[l-spine ap]
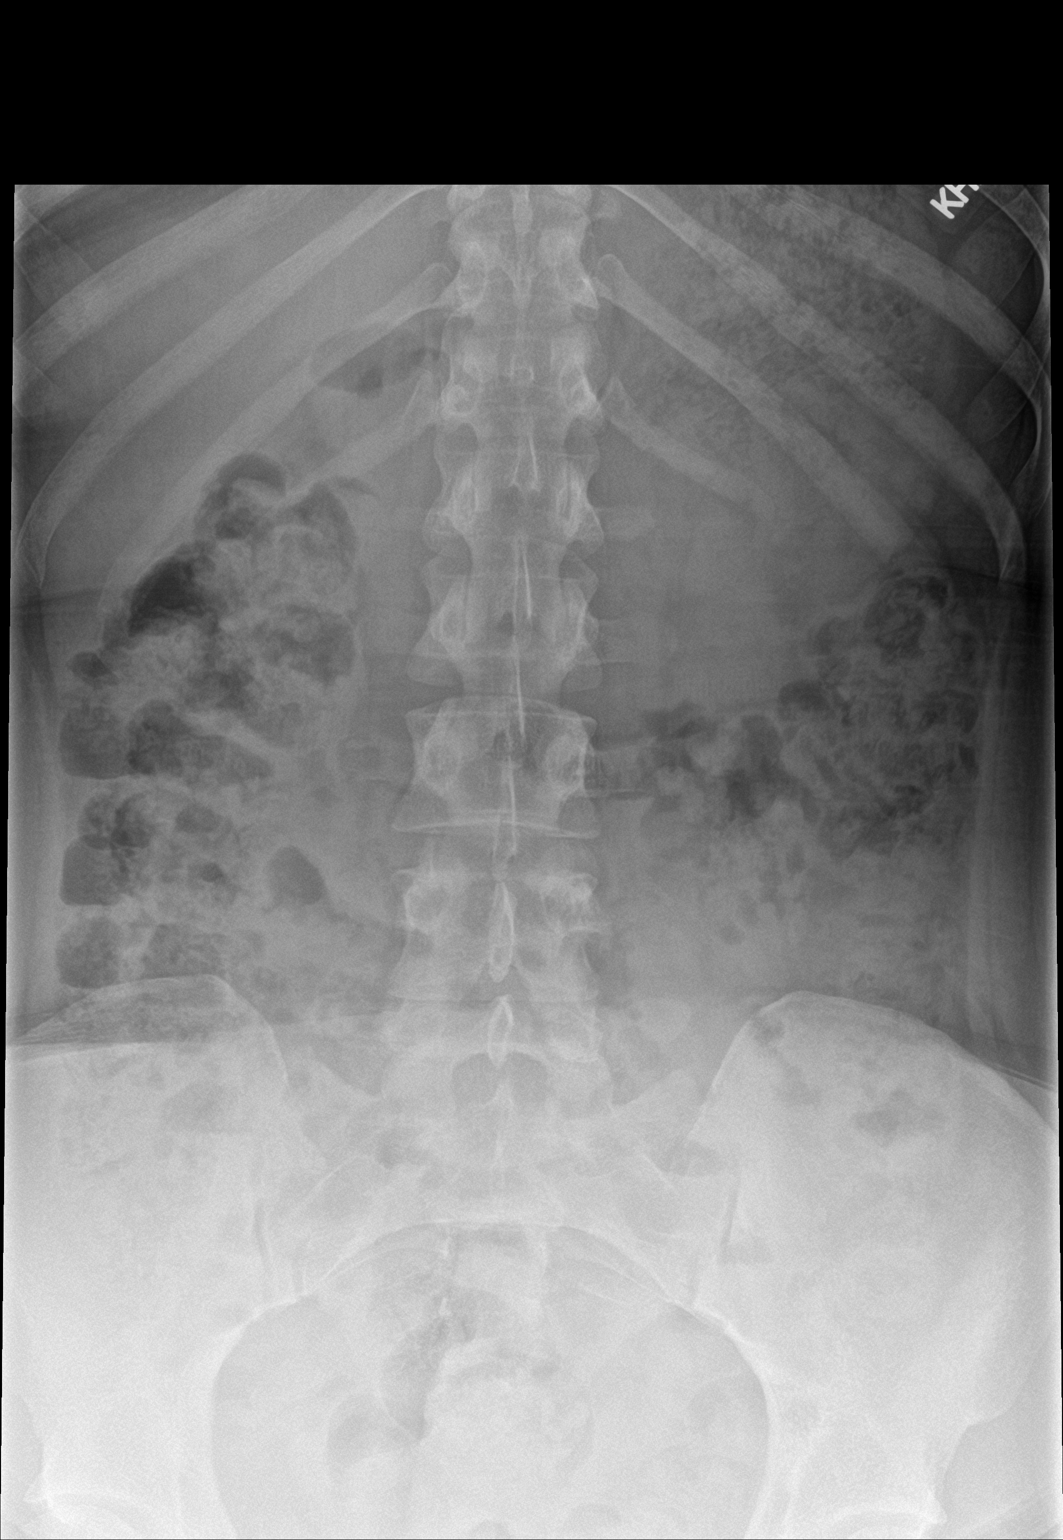

[l-spine lat]
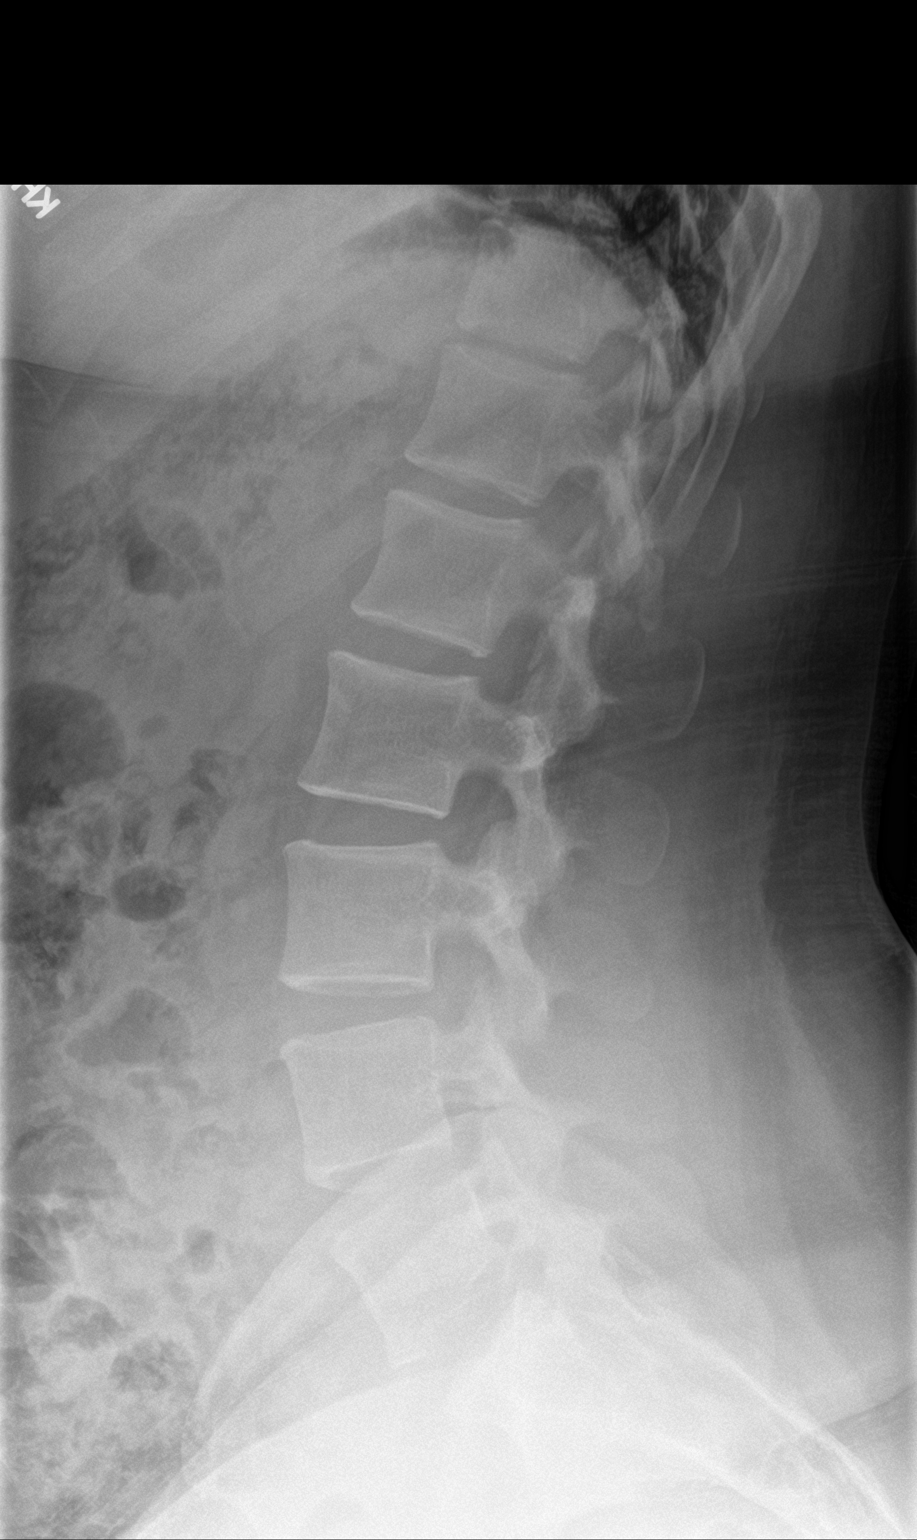

[l-spine spot]
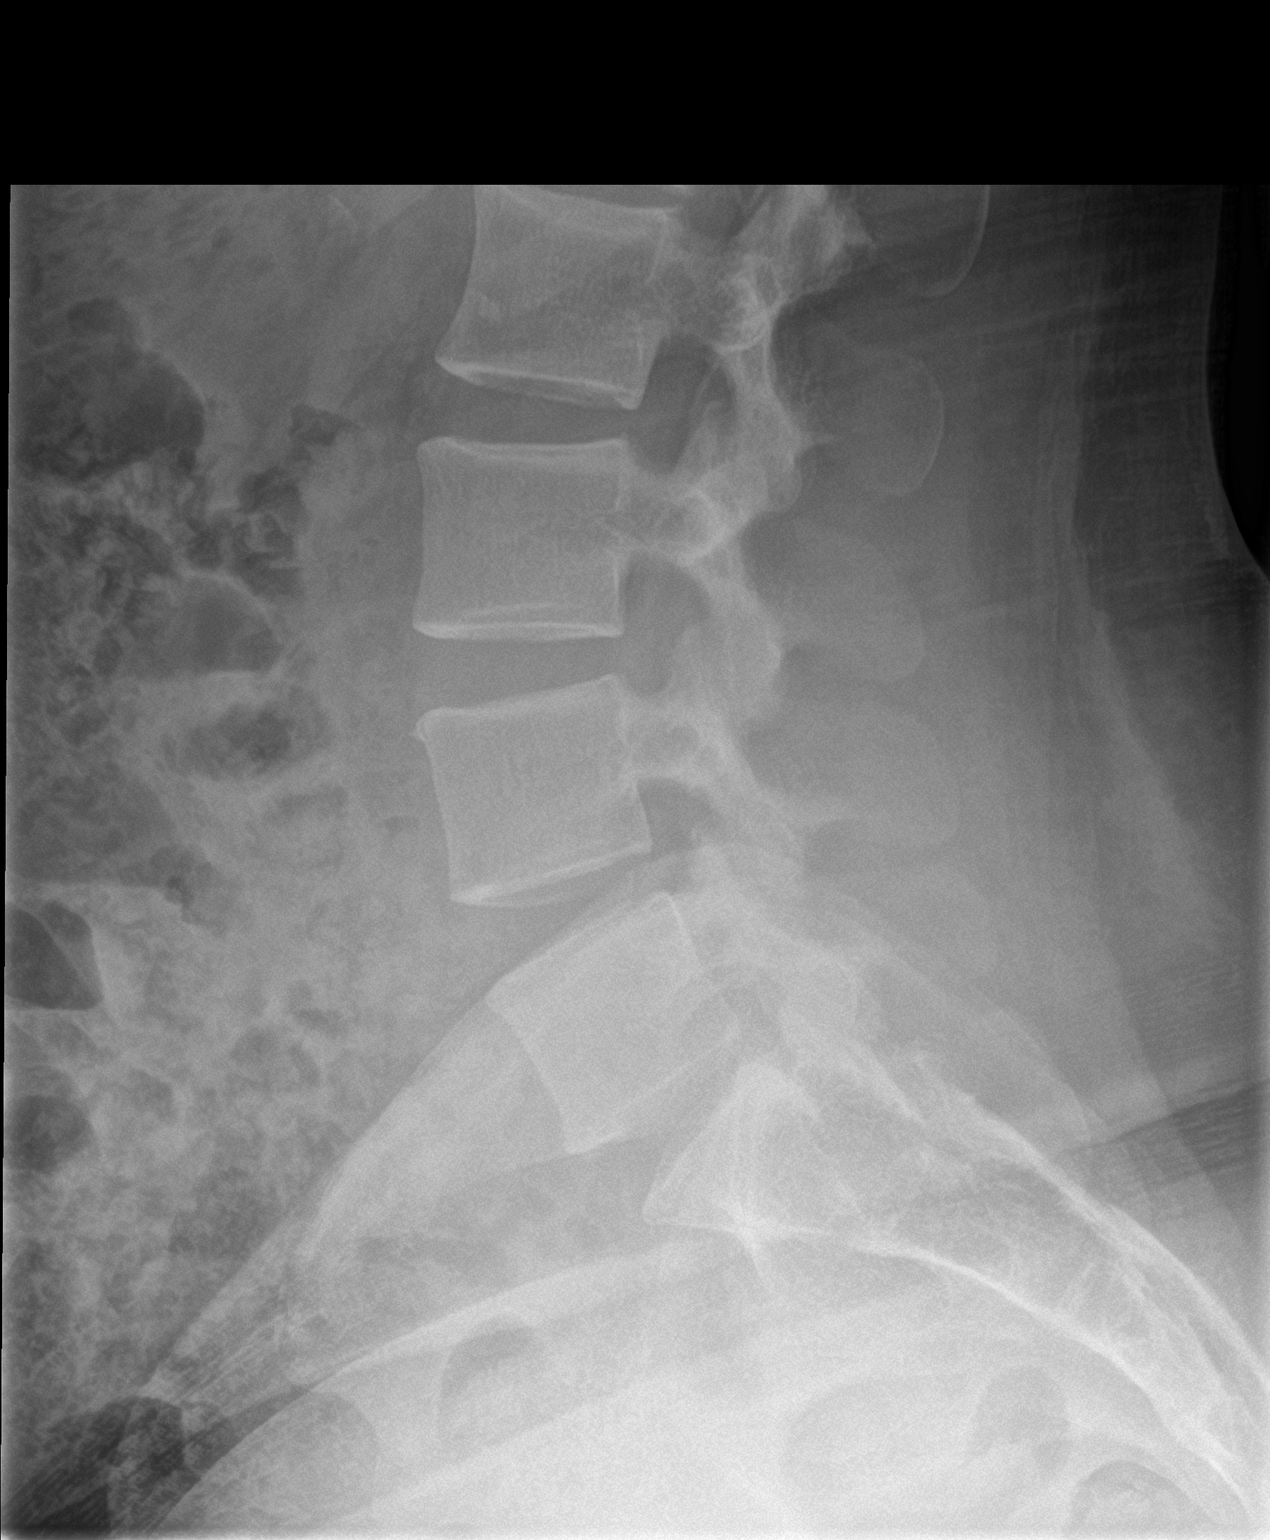

[3 of 3 positions shown; findings below may reference images not displayed]

FINDINGS: There is no evidence of lumbar spine fracture. Alignment is normal.
Intervertebral disc spaces are maintained.
IMPRESSION: Negative.

## 2019-02-03 IMAGING — DX DG THORACIC SPINE 2V
2 series · 2 of 2 positions shown · non-contrast
Comparison: None.

CLINICAL DATA: Fall.  Back pain.

EXAM:
THORACIC SPINE 2 VIEWS

[t-spine ap]
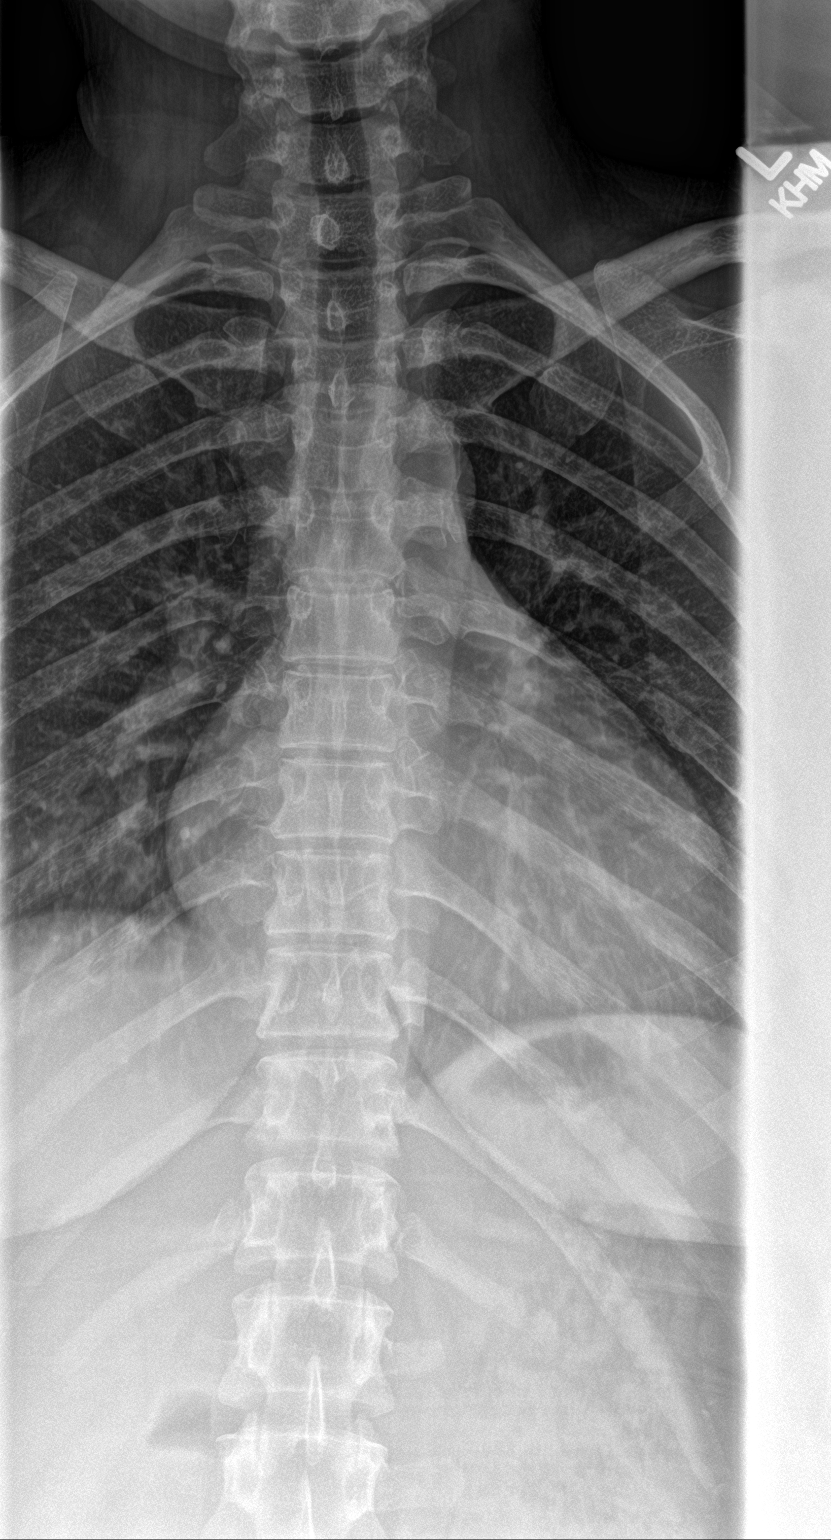

[t-spine lat]
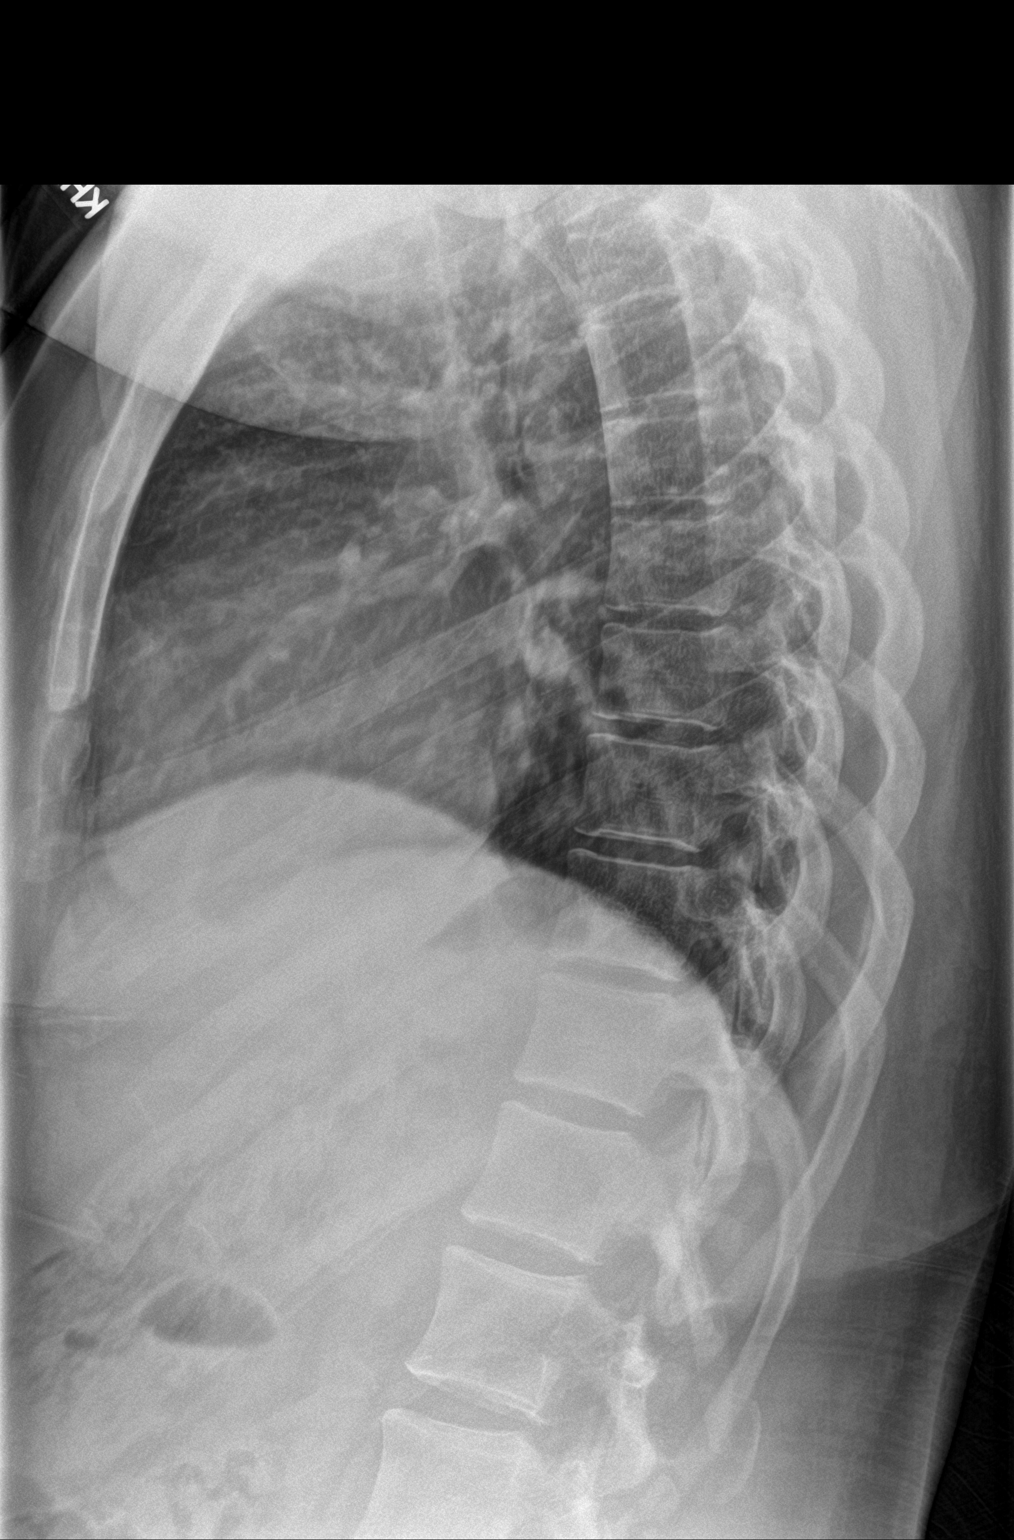

[2 of 2 positions shown; findings below may reference images not displayed]

FINDINGS: No evidence of fracture. No subluxation. Intervertebral disc spaces
are preserved. Frontal film shows no abnormal paraspinal line.
IMPRESSION: Negative.

## 2019-02-03 IMAGING — DX DG CERVICAL SPINE COMPLETE 4+V
6 series · 6 of 6 positions shown · non-contrast
Comparison: [DATE]

CLINICAL DATA: Neck pain after a fall yesterday.

EXAM:
CERVICAL SPINE - COMPLETE 4+ VIEW

[c-spine lat]
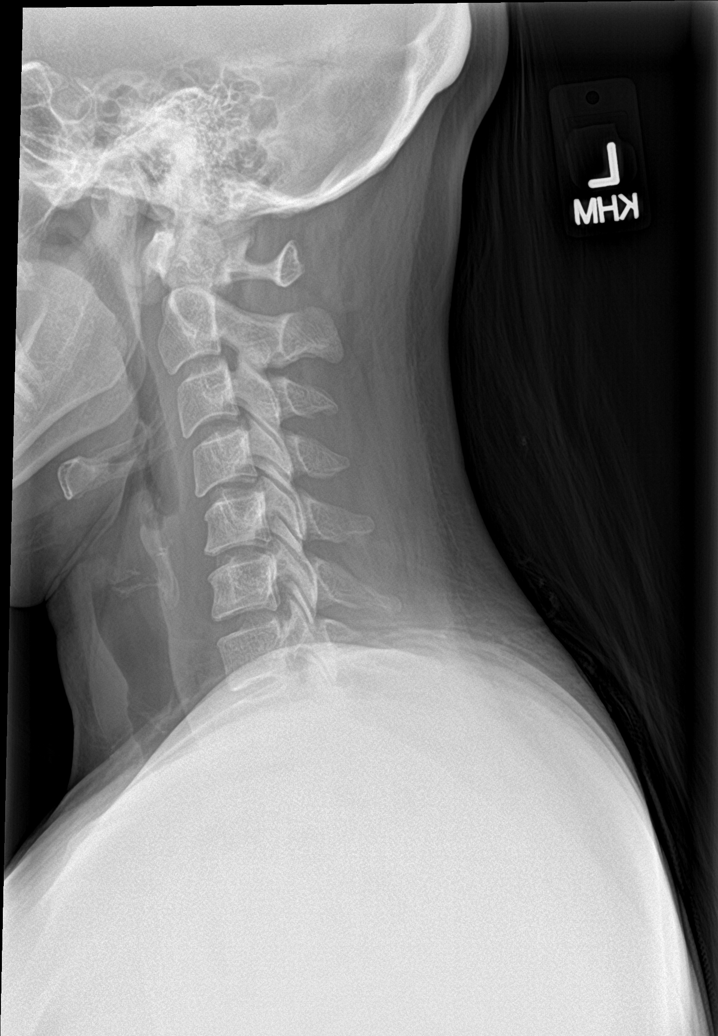

[c-spine obl (1 of 3)]
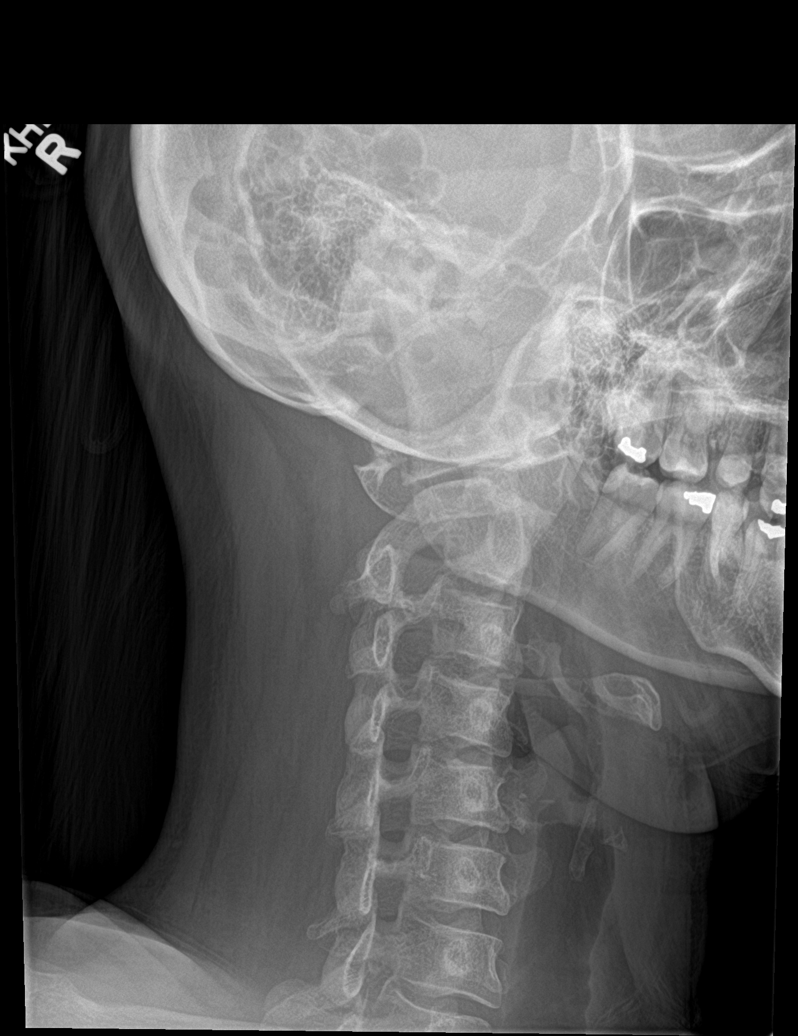

[c-spine obl (2 of 3)]
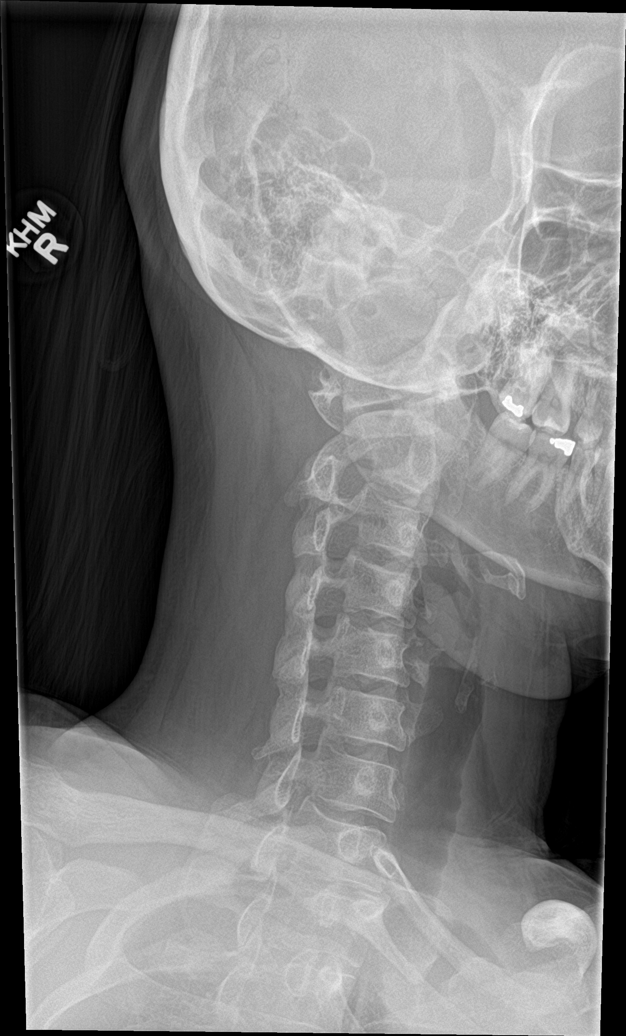

[c-spine ap]
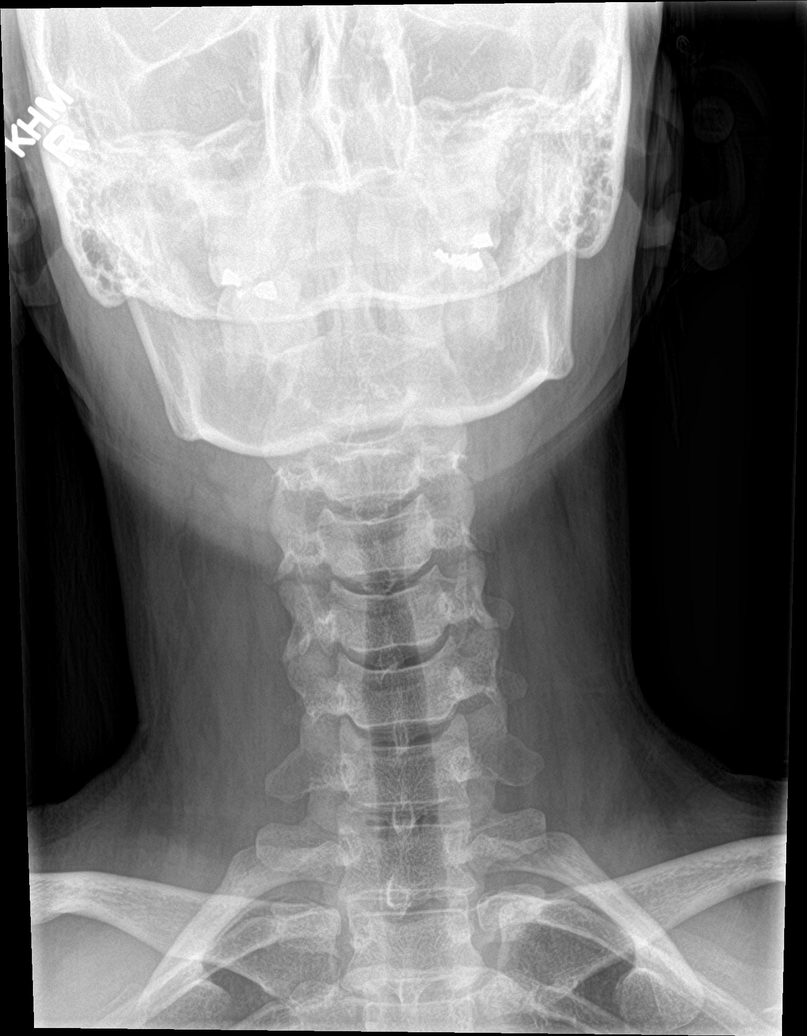

[c-spine open mouth]
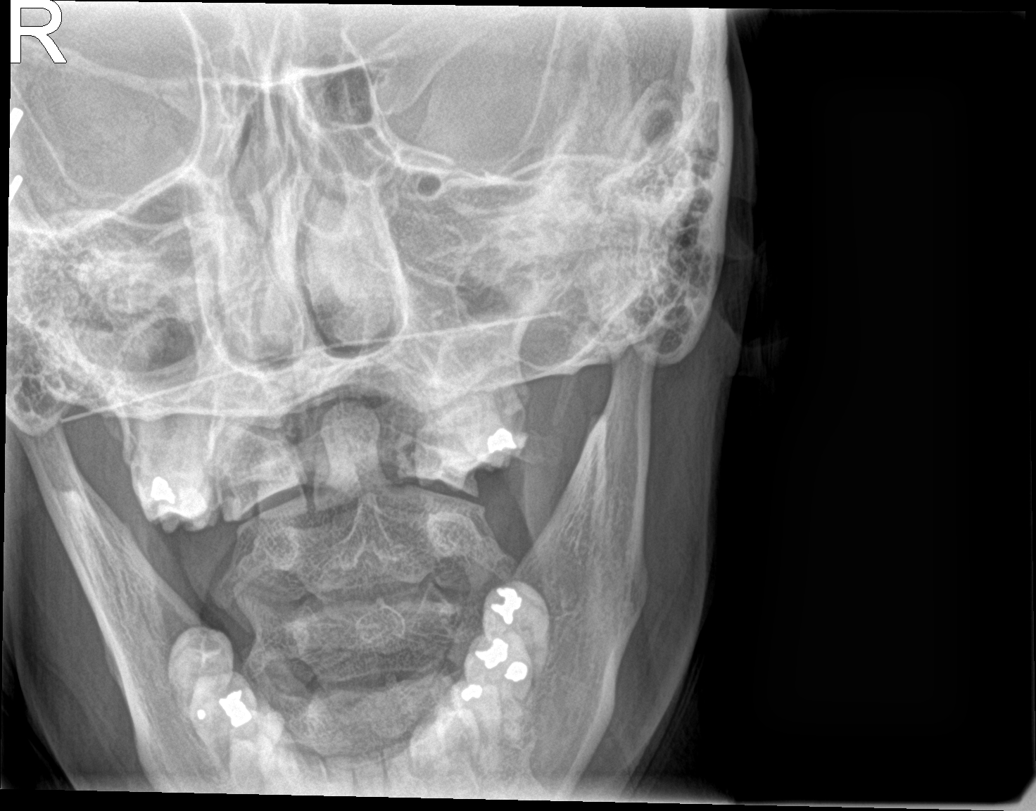

[c-spine obl (3 of 3)]
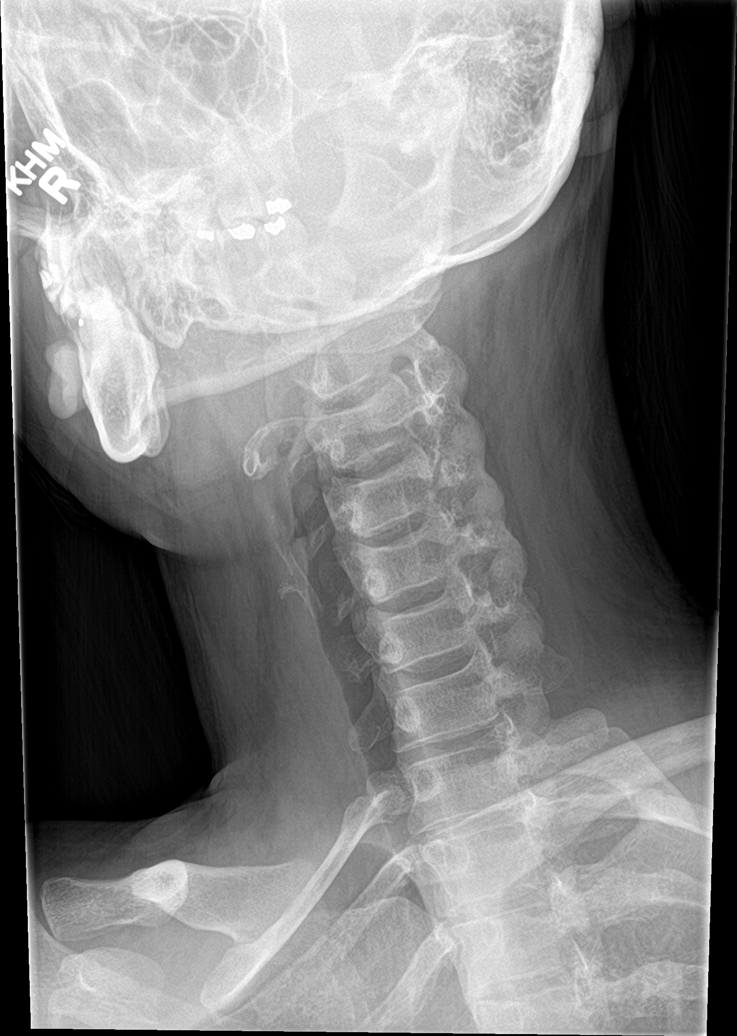

[6 of 6 positions shown; findings below may reference images not displayed]

FINDINGS: Straightening of normal cervical lordosis. There is no evidence of
cervical spine fracture or prevertebral soft tissue swelling.
Alignment is normal. No other significant bone abnormalities are
identified.
IMPRESSION: 1. No evidence for cervical spine fracture.
2. Loss of cervical lordosis. This can be related to patient
positioning, muscle spasm or soft tissue injury.

## 2019-02-03 MED ORDER — IBUPROFEN 800 MG PO TABS
800.0000 mg | ORAL_TABLET | Freq: Once | ORAL | Status: AC
  Administered 2019-02-03: 11:00:00 800 mg via ORAL
  Filled 2019-02-03: qty 1

## 2019-02-03 MED ORDER — IBUPROFEN 600 MG PO TABS: 600.0000 mg | ORAL_TABLET | Freq: Four times a day (QID) | ORAL | 0 refills | Status: DC | PRN

## 2019-02-03 MED ORDER — CYCLOBENZAPRINE HCL 10 MG PO TABS: 10.0000 mg | ORAL_TABLET | Freq: Two times a day (BID) | ORAL | 0 refills | Status: DC | PRN

## 2019-02-03 NOTE — ED Provider Notes (Signed)
[facility] EMERGENCY DEPARTMENT Provider Note   CSN: [id number] Arrival date & time: 02/03/19  [id number]     History Chief Complaint  Patient presents with  . Fall  . Neck Injury    Kellie Martinez is a 23 y.o. female.  The history is provided by the patient. No language interpreter was used.  Fall  Neck Injury      23 year old female presenting for evaluation fall and back injury.  Patient reports last night she was walking her dog, and slipped on mud fell backward and injuring herself.  She did hit her head but denies any loss of consciousness.  She is reporting sharp shooting 10 out of 10 pain from her neck down to her buttocks.  Pain is persistent, worse with movement.  No complaints of lightheadedness or dizziness no chest pain no abdominal pain no bowel bladder incontinence or saddle anesthesia, no numbness or weakness.  No specific treatment tried.  Denies any precipitating symptoms prior to the fall.  No nausea or vomiting.  Patient reports she was unable to move for approximately 40 minutes after the fall.   Past Medical History:  Diagnosis Date  . Anemia   . Anxiety   . Asthma   . Bipolar 1 disorder ([place])   . 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  . Chronic kidney disease   . Depression   . GERD (gastroesophageal reflux disease)   . Molar pregnancy   . Renal calculus or stone    had stent/ lithotripsy in past  . Sleep apnea   . Substance abuse ([place])   . Suicide attempt ([place])    x 2    Patient Active Problem List   Diagnosis Date Noted  . GAD (generalized anxiety disorder) 01/03/2019  . Attention deficit hyperactivity disorder (ADHD), combined type 01/03/2019  . Bipolar 2 disorder ([place]) 01/03/2019  . Precordial pain 07/07/2017  . History of renal stone 05/18/2017  . Umbilical hernia [id number]  . 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 Difficulty sleeping 05/06/2017  . Gastroesophageal reflux disease without esophagitis 05/06/2017  . History of  drug overdose 05/06/2017    Past Surgical History:  Procedure Laterality Date  . CYSTOSCOPY     x2 with stone extraction  . EXTRACORPOREAL SHOCK WAVE LITHOTRIPSY Right 06/20/2013   Procedure: EXTRACORPOREAL SHOCK WAVE LITHOTRIPSY (ESWL);  Surgeon: Edwin Dada, MD;  Location: [facility];  Service: Urology;  Laterality: Right;  . FOOT SURGERY Left 2003  . LITHOTRIPSY    . TUBAL LIGATION  2017  . TUBAL LIGATION       OB History   No obstetric history on file.     Family History  Problem Relation Age of Onset  . Depression Mother   . Asthma Mother   . Hypertension Mother   . Mood Disorder Mother   . Heart disease Father   . Kidney disease Father   . Alcoholism Father   . Breast cancer Paternal Aunt   . Breast cancer Maternal Grandmother   . Breast cancer Cousin   . Alcoholism Brother   . Drug abuse Brother     Social History   Tobacco Use  . Smoking status: Current Some Day Smoker    Packs/day: 0.25    Types: Cigarettes  . Smokeless tobacco: Never Used  Substance Use Topics  . Alcohol use: Yes    Comment: occ  . Drug use: Not Currently    Home Medications Prior to Admission medications   Medication  Sig Start Date End Date Taking? Authorizing Provider  albuterol (VENTOLIN HFA) 108 (90 Base) MCG/ACT inhaler Inhale 1-2 puffs into the lungs every 6 (six) hours as needed for wheezing or shortness of breath. 01/15/19   Wurst, [place], PA-C  benzonatate (TESSALON) 100 MG capsule Take 1 capsule (100 mg total) by mouth every 8 (eight) hours. Patient not taking: Reported on 01/15/2019 01/15/19   Wurst, [place], PA-C  cetirizine-pseudoephedrine (ZYRTEC-D) 5-120 MG tablet Take 1 tablet by mouth daily. Patient not taking: Reported on 01/15/2019 01/15/19   Wurst, [place], PA-C  escitalopram (LEXAPRO) 10 MG tablet Take 1 tablet (10 mg total) by mouth daily with breakfast. 01/03/19 02/02/19  Pucilowski, Marchia Bond, MD  escitalopram (LEXAPRO) 5 MG tablet Take 1 tablet (5 mg total) by  mouth daily for 14 days. After 2 weeks start taking 10 mg tablets. 01/10/19 01/24/19  Pucilowski, Marchia Bond, MD  fluticasone (FLONASE) 50 MCG/ACT nasal spray Place 2 sprays into both nostrils daily. Patient not taking: Reported on 01/15/2019 01/15/19   Wurst, [place], PA-C  guaiFENesin (MUCINEX) 600 MG 12 hr tablet Take 1 tablet (600 mg total) by mouth 2 (two) times daily as needed for cough. 01/15/19   Wurst, [place], PA-C  lamoTRIgine (LAMICTAL) 25 MG tablet Take 1 tablet (25 mg total) by mouth at bedtime for 14 days, THEN 2 tablets (50 mg total) at bedtime for 20 days. 01/03/19 02/06/19  Pucilowski, Marchia Bond, MD  sulfamethoxazole-trimethoprim (BACTRIM DS) 800-160 MG tablet Take 1 tablet by mouth 2 (two) times daily. 01/18/19   Florian Buff, MD  traZODone (DESYREL) 150 MG tablet Take 1 tablet (150 mg total) by mouth at bedtime as needed. 01/03/19 02/02/19  Pucilowski, Marchia Bond, MD    Allergies    Levofloxacin, Penicillins, and Phenergan [promethazine hcl]  Review of Systems   Review of Systems  All other systems reviewed and are negative.   Physical Exam Updated Vital Signs BP 113/83 (BP Location: Right Arm)   Pulse 88   Temp 97.8 F (36.6 C)   Resp 18   Ht 5\' 1"  (1.549 m)   Wt 85.3 kg   LMP 01/09/2019   SpO2 97%   BMI 35.52 kg/m   Physical Exam Vitals and nursing note reviewed.  Constitutional:      General: She is not in acute distress.    Appearance: She is well-developed.  HENT:     Head: Normocephalic and atraumatic.     Comments: Mild tenderness to the occipital scalp without any bruising swelling or crepitus noted. Eyes:     Conjunctiva/sclera: Conjunctivae normal.  Cardiovascular:     Rate and Rhythm: Normal rate and regular rhythm.     Pulses: Normal pulses.     Heart sounds: Normal heart sounds.  Pulmonary:     Effort: Pulmonary effort is normal.     Breath sounds: Normal breath sounds.  Abdominal:     Palpations: Abdomen is soft.  Musculoskeletal:         General: Tenderness (Tenderness along cervical thoracic and lumbar spine midline on palpation without crepitus or step-off.) present.     Cervical back: Neck supple.  Skin:    Findings: No rash.  Neurological:     Mental Status: She is alert and oriented to person, place, and time.     GCS: GCS eye subscore is 4. GCS verbal subscore is 5. GCS motor subscore is 6.     Comments: 5 out of 5 strength to all 4 extremities.  Psychiatric:  Mood and Affect: Mood normal.     ED Results / Procedures / Treatments   Labs (all labs ordered are listed, but only abnormal results are displayed) Labs Reviewed  PREGNANCY, URINE    EKG None  Radiology DG Cervical Spine Complete  Result Date: 02/03/2019 CLINICAL DATA:  Neck pain after a fall yesterday. EXAM: CERVICAL SPINE - COMPLETE 4+ VIEW COMPARISON:  12/01/2017 FINDINGS: Straightening of normal cervical lordosis. There is no evidence of cervical spine fracture or prevertebral soft tissue swelling. Alignment is normal. No other significant bone abnormalities are identified. IMPRESSION: 1. No evidence for cervical spine fracture. 2. Loss of cervical lordosis. This can be related to patient positioning, muscle spasm or soft tissue injury. Electronically Signed   By: Misty Stanley M.D.   On: 02/03/2019 11:12   DG Thoracic Spine 2 View  Result Date: 02/03/2019 CLINICAL DATA:  Fall.  Back pain. EXAM: THORACIC SPINE 2 VIEWS COMPARISON:  None. FINDINGS: No evidence of fracture. No subluxation. Intervertebral disc spaces are preserved. Frontal film shows no abnormal paraspinal line. IMPRESSION: Negative. Electronically Signed   By: Misty Stanley M.D.   On: 02/03/2019 11:14   DG Lumbar Spine 2-3 Views  Result Date: 02/03/2019 CLINICAL DATA:  Fall.  Back pain. EXAM: LUMBAR SPINE - 2-3 VIEW COMPARISON:  None. FINDINGS: There is no evidence of lumbar spine fracture. Alignment is normal. Intervertebral disc spaces are maintained. IMPRESSION:  Negative. Electronically Signed   By: Misty Stanley M.D.   On: 02/03/2019 11:14    Procedures Procedures (including critical care time)  Medications Ordered in ED Medications  ibuprofen (ADVIL) tablet 800 mg (800 mg Oral Given 02/03/19 1117)    ED Course  I have reviewed the triage vital signs and the nursing notes.  Pertinent labs & imaging results that were available during my care of the patient were reviewed by me and considered in my medical decision making (see chart for details).    MDM Rules/Calculators/A&P                      BP 113/83 (BP Location: Right Arm)   Pulse 88   Temp 97.8 F (36.6 C)   Resp 18   Ht 5\' 1"  (1.549 m)   Wt 85.3 kg   LMP 01/09/2019   SpO2 97%   BMI 35.52 kg/m   Final Clinical Impression(s) / ED Diagnoses Final diagnoses:  Fall, initial encounter  Acute back pain, unspecified back location, unspecified back pain laterality    Rx / DC Orders ED Discharge Orders         Ordered    ibuprofen (ADVIL) 600 MG tablet  Every 6 hours PRN     02/03/19 1152    cyclobenzaprine (FLEXERIL) 10 MG tablet  2 times daily PRN     02/03/19 1152         9:47 AM Patient here with mechanical fall last night with pain along her entire spine.  No crepitus or step-off noted.  No focal neuro deficit on exam.  No signs of concussion.  Will obtain appropriate imaging, ibuprofen given for pain.  11:51 AM Fortunately x-ray of the cervical thoracic and lumbar spine without any acute finding.  Rice therapy discussed.  Patient stable for discharge.   Domenic Moras, PA-C 02/03/19 1153    Noemi Chapel, MD 02/04/19 [phone]

## 2019-02-03 NOTE — Discharge Instructions (Addendum)
Fortunately your x-ray of your neck, upper back and lower back show no evidence of any broken bones.  Take medication prescribed as needed for pain.  Return if you have any concern.

## 2019-02-03 NOTE — ED Triage Notes (Signed)
Pt reports slipping in mud last night and falling straight back onto her back. C/o neck pain. No neuro changes.

## 2019-02-03 NOTE — ED Notes (Signed)
Pt states she provided a urine sample already but no sample is present. Pt aware one is needed.

## 2019-02-05 ENCOUNTER — Other Ambulatory Visit: Payer: Self-pay

## 2019-02-05 ENCOUNTER — Ambulatory Visit (INDEPENDENT_AMBULATORY_CARE_PROVIDER_SITE_OTHER): Payer: Medicaid Other | Admitting: Psychiatry

## 2019-02-05 ENCOUNTER — Ambulatory Visit (HOSPITAL_COMMUNITY): Payer: Medicaid Other

## 2019-02-05 DIAGNOSIS — F3181 Bipolar II disorder: Secondary | ICD-10-CM

## 2019-02-05 DIAGNOSIS — F411 Generalized anxiety disorder: Secondary | ICD-10-CM | POA: Diagnosis not present

## 2019-02-05 DIAGNOSIS — F902 Attention-deficit hyperactivity disorder, combined type: Secondary | ICD-10-CM | POA: Diagnosis not present

## 2019-02-05 MED ORDER — ESCITALOPRAM OXALATE 20 MG PO TABS: 20.0000 mg | ORAL_TABLET | Freq: Every day | ORAL | 0 refills | Status: DC

## 2019-02-05 MED ORDER — ZOLPIDEM TARTRATE 10 MG PO TABS: 10.0000 mg | ORAL_TABLET | Freq: Every evening | ORAL | 0 refills | Status: DC | PRN

## 2019-02-05 MED ORDER — LAMOTRIGINE 100 MG PO TABS: 100.0000 mg | ORAL_TABLET | Freq: Every day | ORAL | 0 refills | Status: DC

## 2019-02-05 NOTE — Progress Notes (Signed)
[place] MD/PA/NP OP Progress Note  02/05/2019 4:09 PM Kellie Martinez  MRN:  [id number] Interview was conducted by phone and I verified that I was speaking with the correct person using two identifiers. I discussed the limitations of evaluation and management by telemedicine and  the availability of in person appointments. Patient expressed understanding and agreed to proceed.  Chief Complaint: Anxiety, sleep problems.  HPI: 23 yo DWF referred to [place] for treatment of mood disorder and chronic problems with focusing. Kellie Martinez reports being diagnosed with having bipolar disorder and ADHD when she was 57-15 years old. Unfortunately she does not remember medications she has been tried for either of these conditions just says that "there were many". She seems to recall names of Ritalin and Adderall. Available record also shows that she was tried on SSRIs (citalopram, fluoxetine), aripiprazole, oxcarbazepine, hydroxyzine, clonazepam. She describes her mood swings as being very rapid - happening several times during the day. The upswings are described and increased irritability, increased energy, racing thoughts, impulsivity (no euphoria). The longest such an episode can last is less than a day and she denies ever having psychotic symptoms. She also denies having hx of prolonged depressive episodes, suicidal thoughts although she admits OD on meds impulsively out of frustration when she was 13. She did not go to the hospital then and has npo hx of inpatient psychiatric admissions. She has struggled with focusing, task completion, procrastination throughout her school days. It is unclear if she had a component of hyperactivity ("I don't know"). This was accompanied by anxiety as she felt being inadequate, low achieving. She tried to self medicate with marijuana but has not used this drug "for a fe years". She denies abusing other street or prescription drugs. At one time (few years ago) she was also drinking "too much" but  she dos not do it any more. I suspect she is more likely to have combined ADHD than with bipolar 1 disorder - she may have a bipolar spectrum - more closely resembling bipolar 2 disorder though. She also likely has GAD. Cannabis use disorder is in full remission. We started Lamictal titration and escitalopram 10 mg daily. Initially she had muscle cramps and some sedation but both have resolved. Anxiety has not improved much so far. a week ago. She called reporting cramps in both legs and arms. It I possible these are side effects of escitalopram. She was initially sedated but this has resolved (again likely due to Lexapro. She also complains of difficulty with falling asleep - trazodone has not been very effective for that (150 mg).  Visit Diagnosis:    ICD-10-CM   1. GAD (generalized anxiety disorder)  F41.1   2. Attention deficit hyperactivity disorder (ADHD), combined type  F90.2   3. Bipolar 2 disorder ([facility])  F31.81     Past Psychiatric History: Please see intake H&P.  Past Medical History:  Past Medical History:  Diagnosis Date  . Anemia   . Anxiety   . Asthma   . Bipolar 1 disorder ([place])   . 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  . Chronic kidney disease   . Depression   . GERD (gastroesophageal reflux disease)   . Molar pregnancy   . Renal calculus or stone    had stent/ lithotripsy in past  . Sleep apnea   . Substance abuse ([place])   . Suicide attempt ([place])    x 2    Past Surgical History:  Procedure Laterality Date  . CYSTOSCOPY     x2  with stone extraction  . EXTRACORPOREAL SHOCK WAVE LITHOTRIPSY Right 06/20/2013   Procedure: EXTRACORPOREAL SHOCK WAVE LITHOTRIPSY (ESWL);  Surgeon: Edwin Dada, MD;  Location: [facility];  Service: Urology;  Laterality: Right;  . FOOT SURGERY Left 2003  . LITHOTRIPSY    . TUBAL LIGATION  2017  . TUBAL LIGATION      Family Psychiatric History: Reviewed.  Family History:  Family History  Problem Relation Age of Onset  . Depression Mother   . Asthma  Mother   . Hypertension Mother   . Mood Disorder Mother   . Heart disease Father   . Kidney disease Father   . Alcoholism Father   . Breast cancer Paternal Aunt   . Breast cancer Maternal Grandmother   . Breast cancer Cousin   . Alcoholism Brother   . Drug abuse Brother     Social History:  Social History   Socioeconomic History  . Marital status: Divorced    Spouse name: Not on file  . Number of children: 3  . Years of education: Not on file  . Highest education level: Not on file  Occupational History  . Not on file  Tobacco Use  . Smoking status: Current Some Day Smoker    Packs/day: 0.25    Types: Cigarettes  . Smokeless tobacco: Never Used  Substance and Sexual Activity  . Alcohol use: Yes    Comment: occ  . Drug use: Not Currently  . Sexual activity: Yes    Birth control/protection: Surgical  Other Topics Concern  . Not on file  Social History Narrative  . Not on file   Social Determinants of Health   Financial Resource Strain:   . Difficulty of Paying Living Expenses: Not on file  Food Insecurity:   . Worried About Charity fundraiser in the Last Year: Not on file  . Ran Out of Food in the Last Year: Not on file  Transportation Needs:   . Lack of Transportation (Medical): Not on file  . Lack of Transportation (Non-Medical): Not on file  Physical Activity:   . Days of Exercise per Week: Not on file  . Minutes of Exercise per Session: Not on file  Stress:   . Feeling of Stress : Not on file  Social Connections:   . Frequency of Communication with Friends and Family: Not on file  . Frequency of Social Gatherings with Friends and Family: Not on file  . Attends Religious Services: Not on file  . Active Member of Clubs or Organizations: Not on file  . Attends Archivist Meetings: Not on file  . Marital Status: Not on file    Allergies:  Allergies  Allergen Reactions  . Levofloxacin   . Penicillins Hives    No anaphylaxis  Did it  involve swelling of the face/tongue/throat, SOB, or low BP? No Did it involve sudden or severe rash/hives, skin peeling, or any reaction on the inside of your mouth or nose? Yes Did you need to seek medical attention at a hospital or doctor's office? Yes When did it last happen?Childhood If all above answers are "NO", may proceed with cephalosporin use.   Marland Kitchen Phenergan [Promethazine Hcl]     Tremors, restless lef, akathesia [id number]    Metabolic Disorder Labs: Lab Results  Component Value Date   HGBA1C 5.0 03/13/2018   No results found for: PROLACTIN Lab Results  Component Value Date   CHOL 108 03/13/2018   TRIG 45 03/13/2018   HDL  35 (L) 03/13/2018   CHOLHDL 3.1 03/13/2018   [place] 64 03/13/2018   Lab Results  Component Value Date   TSH 1.170 03/13/2018    Therapeutic Level Labs: No results found for: LITHIUM No results found for: VALPROATE No components found for:  CBMZ  Current Medications: Current Outpatient Medications  Medication Sig Dispense Refill  . albuterol (VENTOLIN HFA) 108 (90 Base) MCG/ACT inhaler Inhale 1-2 puffs into the lungs every 6 (six) hours as needed for wheezing or shortness of breath. 18 g 0  . benzonatate (TESSALON) 100 MG capsule Take 1 capsule (100 mg total) by mouth every 8 (eight) hours. (Patient not taking: Reported on 01/15/2019) 21 capsule 0  . cetirizine-pseudoephedrine (ZYRTEC-D) 5-120 MG tablet Take 1 tablet by mouth daily. (Patient not taking: Reported on 01/15/2019) 30 tablet 0  . cyclobenzaprine (FLEXERIL) 10 MG tablet Take 1 tablet (10 mg total) by mouth 2 (two) times daily as needed for muscle spasms. 20 tablet 0  . escitalopram (LEXAPRO) 20 MG tablet Take 1 tablet (20 mg total) by mouth daily. 30 tablet 0  . fluticasone (FLONASE) 50 MCG/ACT nasal spray Place 2 sprays into both nostrils daily. (Patient not taking: Reported on 01/15/2019) 16 g 0  . guaiFENesin (MUCINEX) 600 MG 12 hr tablet Take 1 tablet (600 mg total) by mouth 2  (two) times daily as needed for cough. 30 tablet 0  . ibuprofen (ADVIL) 600 MG tablet Take 1 tablet (600 mg total) by mouth every 6 (six) hours as needed. 30 tablet 0  . lamoTRIgine (LAMICTAL) 100 MG tablet Take 1 tablet (100 mg total) by mouth at bedtime. 30 tablet 0  . sulfamethoxazole-trimethoprim (BACTRIM DS) 800-160 MG tablet Take 1 tablet by mouth 2 (two) times daily. 14 tablet 0  . zolpidem (AMBIEN) 10 MG tablet Take 1 tablet (10 mg total) by mouth at bedtime as needed for sleep. 30 tablet 0   No current facility-administered medications for this visit.      Psychiatric Specialty Exam: Review of Systems  Psychiatric/Behavioral: Positive for decreased concentration and sleep disturbance. The patient is nervous/anxious.   All other systems reviewed and are negative.   Last menstrual period 01/09/2019.There is no height or weight on file to calculate BMI.  General Appearance: NA  Eye Contact:  NA  Speech:  Clear and Coherent and Normal Rate  Volume:  Normal  Mood:  Anxious and Depressed  Affect:  NA  Thought Process:  Descriptions of Associations: Circumstantial  Orientation:  Full (Time, Place, and Person)  Thought Content: Logical   Suicidal Thoughts:  No  Homicidal Thoughts:  No  Memory:  Immediate;   Fair Recent;   Fair Remote;   Fair  Judgement:  Fair  Insight:  Fair  Psychomotor Activity:  NA  Concentration:  Concentration: Fair  Recall:  [place] of Knowledge: Fair  Language: Good  Akathisia:  Negative  Handed:  Right  AIMS (if indicated): not done  Assets:  Desire for Improvement Housing Physical Health Resilience  ADL's:  Intact  Cognition: WNL  Sleep:  Poor   Screenings: GAD-7     Office Visit from 02/20/2018 in [facility] at [facility]  Total GAD-7 Score  18    PHQ2-9     Office Visit from 02/20/2018 in [place] at [facility] Visit from 05/06/2017 in [place]  PHQ-2 Total Score  3  2  PHQ-9 Total  Score  16  12  Assessment and Plan: 23 yo DWF referred to [place] for treatment of mood disorder and chronic problems with focusing. Kellie Martinez reports being diagnosed with having bipolar disorder and ADHD when she was 56-48 years old. Unfortunately she does not remember medications she has been tried for either of these conditions just says that "there were many". She seems to recall names of Ritalin and Adderall. Available record also shows that she was tried on SSRIs (citalopram, fluoxetine), aripiprazole, oxcarbazepine, hydroxyzine, clonazepam. She describes her mood swings as being very rapid - happening several times during the day. The upswings are described and increased irritability, increased energy, racing thoughts, impulsivity (no euphoria). The longest such an episode can last is less than a day and she denies ever having psychotic symptoms. She also denies having hx of prolonged depressive episodes, suicidal thoughts although she admits OD on meds impulsively out of frustration when she was 13. She did not go to the hospital then and has npo hx of inpatient psychiatric admissions. She has struggled with focusing, task completion, procrastination throughout her school days. It is unclear if she had a component of hyperactivity ("I don't know"). This was accompanied by anxiety as she felt being inadequate, low achieving. She tried to self medicate with marijuana but has not used this drug "for a fe years". She denies abusing other street or prescription drugs. At one time (few years ago) she was also drinking "too much" but she dos not do it any more. I suspect she is more likely to have combined ADHD than with bipolar 1 disorder - she may have a bipolar spectrum - more closely resembling bipolar 2 disorder though. She also likely has GAD. Cannabis use disorder is in full remission. We started Lamictal titration and escitalopram 10 mg daily. Initially she had muscle cramps and some sedation but both  have resolved. Anxiety has not improved much so far. a week ago. She called reporting cramps in both legs and arms. It I possible these are side effects of escitalopram. She was initially sedated but this has resolved (again likely due to Lexapro. She also complains of difficulty with falling asleep - trazodone has not been very effective for that (150 mg).  Dx: Bipolar 2; GAD; ADHD   Plan: We will increase escitalopram to 20 mg for anxiety and Lamictal to 100 mg at HS for mood. I will dc trazodone and try Ambien prn sleep. We may add Adderall next if her mood stabilizes further.  Next appointment in one month.The plan was discussed with patient who had an opportunity to ask questions and these were all answered. I spend39minutes in phone consultation with the patient.    Stephanie Acre, MD 02/05/2019, 4:09 PM

## 2019-02-06 ENCOUNTER — Encounter: Payer: Self-pay | Admitting: Nurse Practitioner

## 2019-02-20 DIAGNOSIS — J029 Acute pharyngitis, unspecified: Secondary | ICD-10-CM | POA: Diagnosis not present

## 2019-02-20 DIAGNOSIS — J988 Other specified respiratory disorders: Secondary | ICD-10-CM | POA: Diagnosis not present

## 2019-02-20 DIAGNOSIS — H6592 Unspecified nonsuppurative otitis media, left ear: Secondary | ICD-10-CM | POA: Diagnosis not present

## 2019-02-20 DIAGNOSIS — Z20822 Contact with and (suspected) exposure to covid-19: Secondary | ICD-10-CM | POA: Diagnosis not present

## 2019-02-21 ENCOUNTER — Encounter: Payer: Self-pay | Admitting: Nurse Practitioner

## 2019-02-21 DIAGNOSIS — M545 Low back pain: Secondary | ICD-10-CM | POA: Diagnosis not present

## 2019-02-21 DIAGNOSIS — M542 Cervicalgia: Secondary | ICD-10-CM | POA: Diagnosis not present

## 2019-02-23 ENCOUNTER — Other Ambulatory Visit: Payer: Self-pay

## 2019-02-23 ENCOUNTER — Ambulatory Visit (HOSPITAL_COMMUNITY)
Admission: RE | Admit: 2019-02-23 | Discharge: 2019-02-23 | Disposition: A | Discharge status: Home / Self Care | Payer: Medicaid Other | Source: Ambulatory Visit | Attending: Nurse Practitioner | Admitting: Nurse Practitioner

## 2019-02-23 ENCOUNTER — Encounter: Payer: Self-pay | Admitting: Nurse Practitioner

## 2019-02-23 ENCOUNTER — Telehealth: Payer: Self-pay

## 2019-02-23 DIAGNOSIS — Q619 Cystic kidney disease, unspecified: Secondary | ICD-10-CM | POA: Diagnosis not present

## 2019-02-23 DIAGNOSIS — N281 Cyst of kidney, acquired: Secondary | ICD-10-CM | POA: Diagnosis not present

## 2019-02-23 IMAGING — US US RENAL
1 series · 14 of 25 positions shown · non-contrast
Comparison: Renal ultrasound [DATE]

CLINICAL DATA: Cystic kidney disease.

EXAM:
RENAL / URINARY TRACT ULTRASOUND COMPLETE

[Series 1: us renal · 14 of 39 slices shown]
[im 1/39]
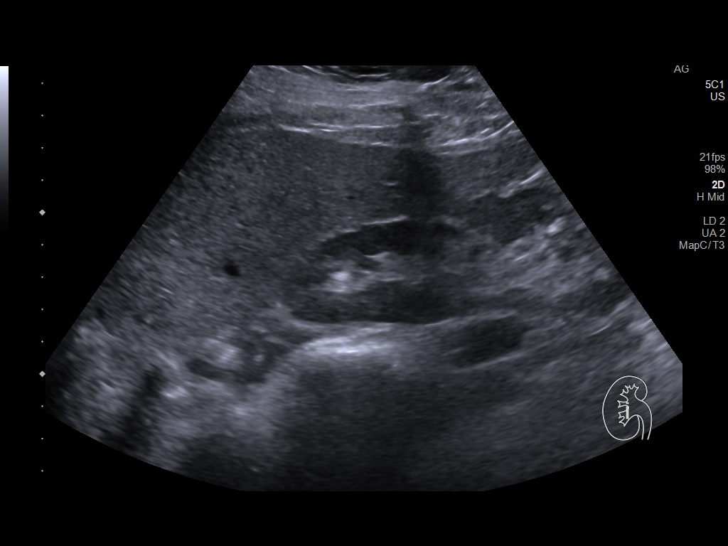
[im 4/39]
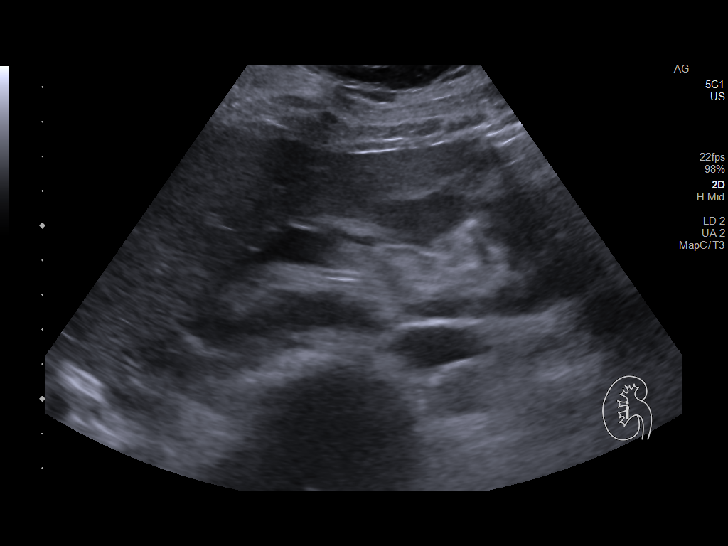
[im 7/39]
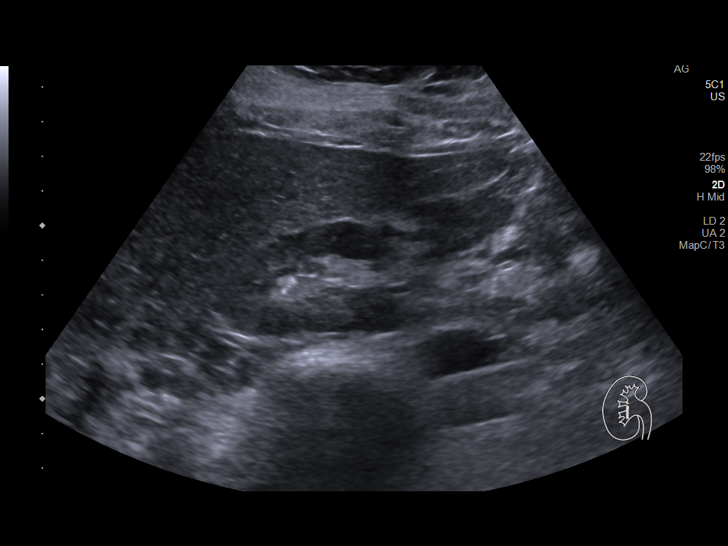
[im 10/39]
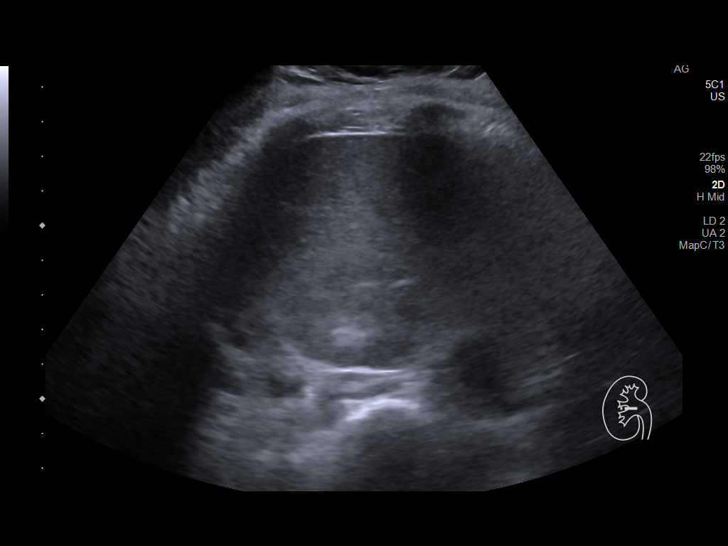
[im 13/39]
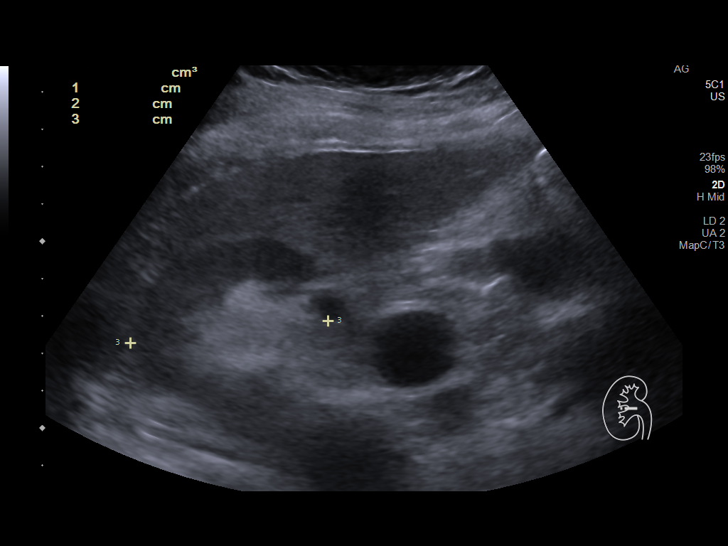
[im 15/39]
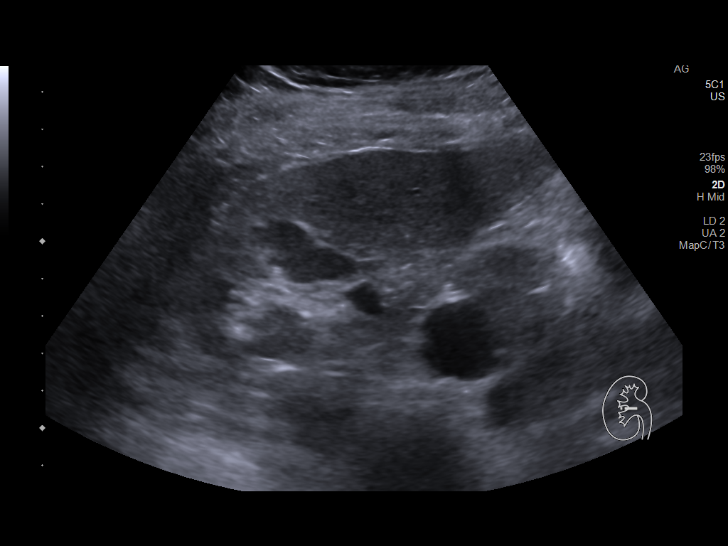
[im 18/39]
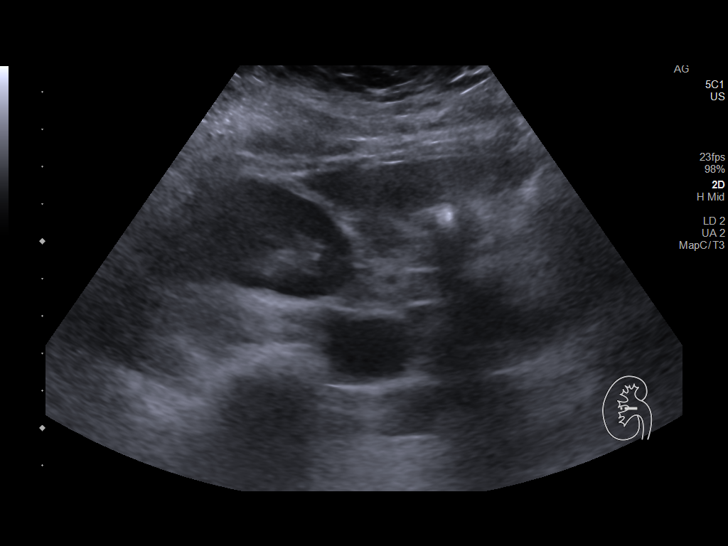
[im 21/39]
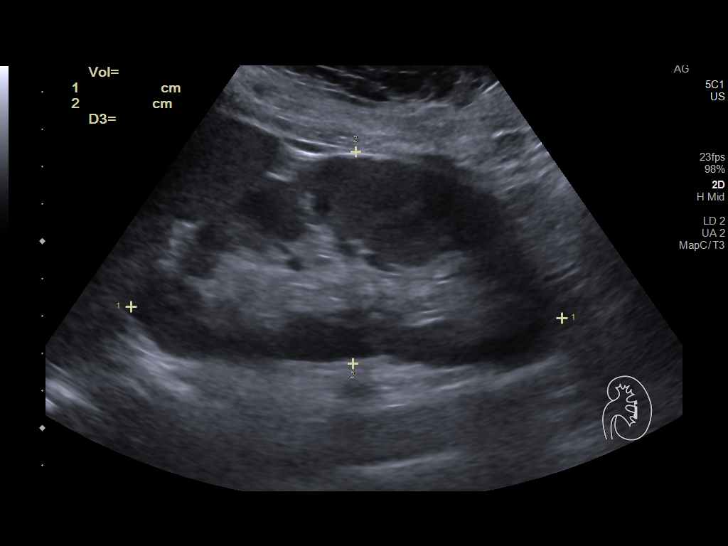
[im 24/39]
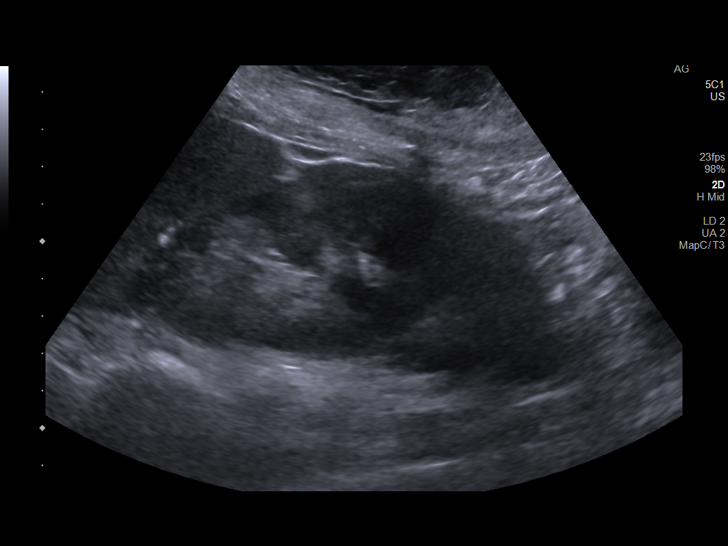
[im 26/39]
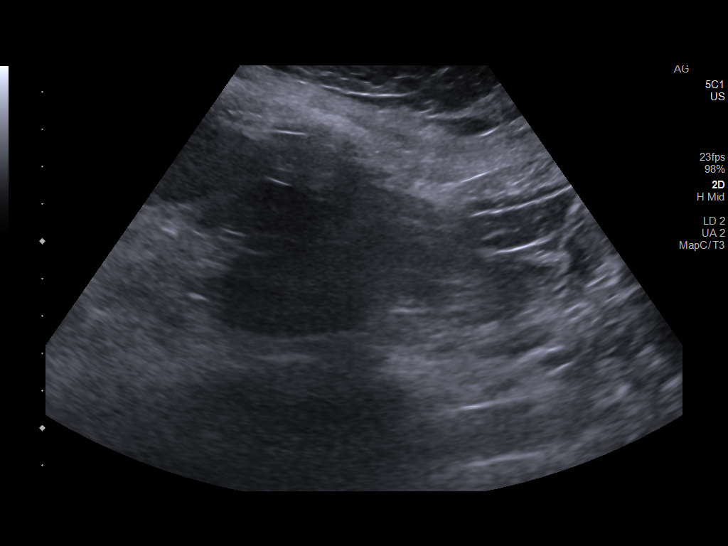
[im 29/39]
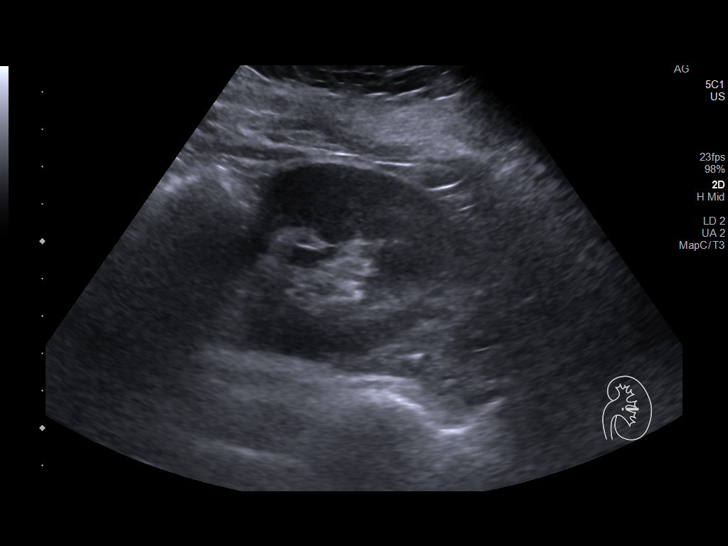
[im 32/39]
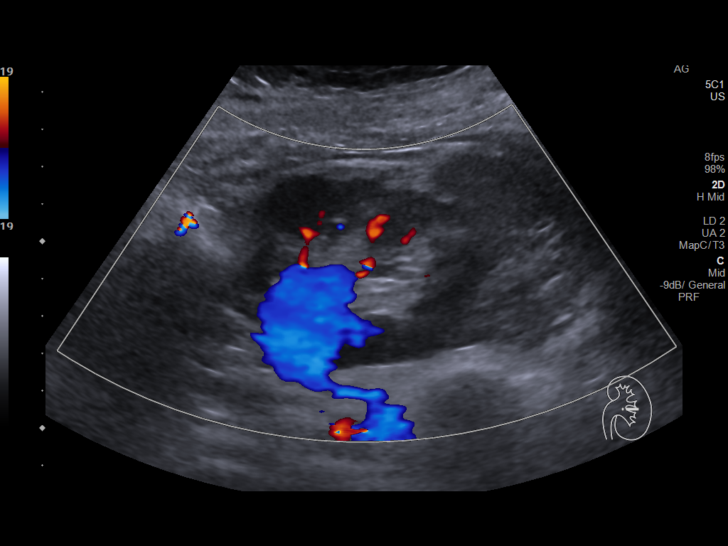
[im 35/39]
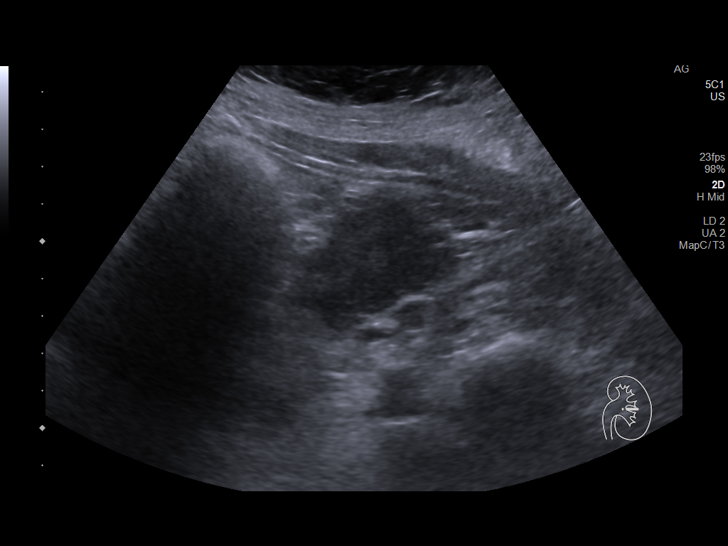
[im 39/39]
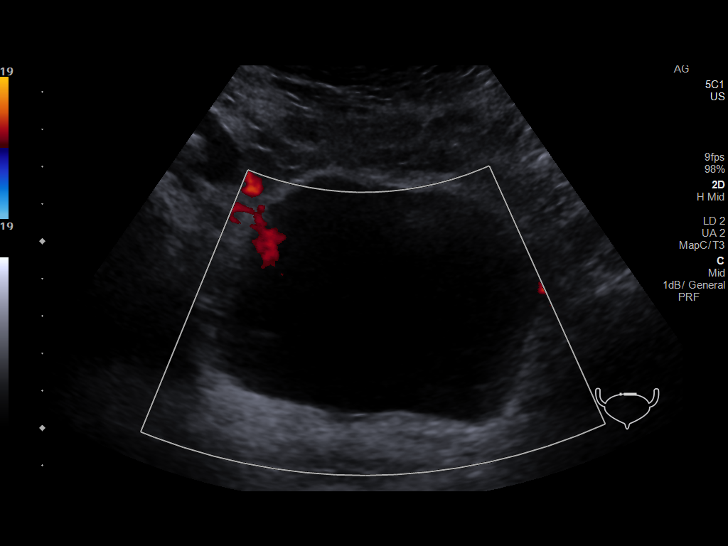

[14 of 25 positions shown; findings below may reference images not displayed]

FINDINGS: Right Kidney:

Renal measurements: 11.5 x 3.8 x 5.3 cm = volume: 124 mL. Cortical
thinning in the upper and mid poles. No hydronephrosis. No focal
renal mass. Normal echotexture.

Left Kidney:

Renal measurements: 11.5 x 5.7 x 6.3 cm = volume: 214 mL.
Echogenicity within normal limits. No mass or hydronephrosis
visualized.

Bladder:

Appears normal for degree of bladder distention.

Other:

None.
IMPRESSION: Cortical thinning in the mid and upper poles of the right kidney. No
mass. No hydronephrosis.

## 2019-02-23 NOTE — Telephone Encounter (Signed)
Called patient and scheduled an office visit with Dr. Havery Moros on 03/29/19 to evaluate her abdominal pain and hernia.

## 2019-02-26 ENCOUNTER — Other Ambulatory Visit: Payer: Self-pay | Admitting: Nurse Practitioner

## 2019-02-26 ENCOUNTER — Encounter: Payer: Self-pay | Admitting: Nurse Practitioner

## 2019-02-26 DIAGNOSIS — M25521 Pain in right elbow: Secondary | ICD-10-CM

## 2019-02-26 DIAGNOSIS — Z711 Person with feared health complaint in whom no diagnosis is made: Secondary | ICD-10-CM

## 2019-02-26 NOTE — Progress Notes (Signed)
Kellie Martinez is requesting a referral to a kidney specialist. I have instructed her that her renal US is normal. She does not have a history of cystic kidney disease from previous reports however she is insistent on referral.

## 2019-02-27 ENCOUNTER — Encounter: Payer: Self-pay | Admitting: Nurse Practitioner

## 2019-03-02 ENCOUNTER — Ambulatory Visit: Payer: Medicaid Other | Admitting: Neurology

## 2019-03-02 ENCOUNTER — Telehealth: Payer: Self-pay | Admitting: Family Medicine

## 2019-03-02 NOTE — Telephone Encounter (Signed)
Patient called and requested an update regarding her kidney referral. Please fu at your earliest convenience.

## 2019-03-05 NOTE — Telephone Encounter (Signed)
The referral for was sent on 02/27/2019 to [place] contacted [facility] and the pcp is reviewing the notes ans I contacted the patient and she is aware and have their information . Thank You .

## 2019-03-08 ENCOUNTER — Other Ambulatory Visit: Payer: Self-pay

## 2019-03-08 ENCOUNTER — Ambulatory Visit (INDEPENDENT_AMBULATORY_CARE_PROVIDER_SITE_OTHER): Payer: Medicaid Other | Admitting: Psychiatry

## 2019-03-08 DIAGNOSIS — F411 Generalized anxiety disorder: Secondary | ICD-10-CM | POA: Diagnosis not present

## 2019-03-08 DIAGNOSIS — F3181 Bipolar II disorder: Secondary | ICD-10-CM | POA: Diagnosis not present

## 2019-03-08 DIAGNOSIS — F902 Attention-deficit hyperactivity disorder, combined type: Secondary | ICD-10-CM | POA: Diagnosis not present

## 2019-03-08 MED ORDER — LAMOTRIGINE 200 MG PO TABS: 200.0000 mg | ORAL_TABLET | Freq: Every day | ORAL | 0 refills | Status: DC

## 2019-03-08 MED ORDER — ZOLPIDEM TARTRATE ER 12.5 MG PO TBCR: 12.5000 mg | EXTENDED_RELEASE_TABLET | Freq: Every evening | ORAL | 0 refills | Status: DC | PRN

## 2019-03-08 MED ORDER — ESCITALOPRAM OXALATE 20 MG PO TABS: 20.0000 mg | ORAL_TABLET | Freq: Every day | ORAL | 2 refills | Status: DC

## 2019-03-08 NOTE — Progress Notes (Signed)
[facility] MD/PA/NP OP Progress Note  03/08/2019 3:39 PM Kellie Martinez  MRN:  [id number]  Interview was conducted by phone and I verified that I was speaking with the correct person using two identifiers. I discussed the limitations of evaluation and management by telemedicine and  the availability of in person appointments. Patient expressed understanding and agreed to proceed.  Chief Complaint: My mood still go up and down and I cannot stay asleep.  HPI: 24 yo DWF referred to [place] for treatment of mood disorder and chronic problems with focusing. Ajanay reports being diagnosed with having bipolar disorder and ADHD when she was 42-52 years old. Unfortunately she does not remember medications she has been tried for either of these conditions just says that "there were many". She seems to recall names of Ritalin and Adderall. Available record also shows that she was tried on SSRIs (citalopram, fluoxetine), aripiprazole, oxcarbazepine, hydroxyzine, clonazepam. She describes her mood swings as being very rapid - happening several times during the day. The upswings are described and increased irritability, increased energy, racing thoughts, impulsivity (no euphoria). The longest such an episode can last is less than a day and she denies ever having psychotic symptoms. She also denies having hx of prolonged depressive episodes, suicidal thoughts although she admits OD on meds impulsively out of frustration when she was 13. She did not go to the hospital then and has npo hx of inpatient psychiatric admissions. She has struggled with focusing, task completion, procrastination throughout her school days. It is unclear if she had a component of hyperactivity ("I don't know"). This was accompanied by anxiety as she felt being inadequate, low achieving. She tried to self medicate with marijuana but has not used this drug "for a fe years". She denies abusing other street or prescription drugs. At one time (few years ago) she  was also drinking "too much" but she dos not do it any more.I suspect she is more likely to havecombined ADHD than with bipolar 1 disorder - she may have a bipolar spectrum - more closely resembling bipolar 2 disorder though. She also likely has GAD. Cannabis use disorder is in full remission. WestartedLamictal titration and escitalopram 10 mg daily. Initially she had muscle cramps and some sedation but both have resolved. Anxiety has not improved much so far.a week ago. She called reporting cramps in both legs and arms. It I possible these are side effects of escitalopram. She was initially sedated but this has resolved (again likely due to Lexapro). She also complains of difficulty with falling asleep - trazodone has not been very effective for that (150 mg) and Ambien  Also only helps her fall asleep but she is up after few hours. Her anxiety is now well controlled but mood swings continue.   Visit Diagnosis:    ICD-10-CM   1. Bipolar 2 disorder (HCC)  F31.81   2. GAD (generalized anxiety disorder)  F41.1   3. Attention deficit hyperactivity disorder (ADHD), combined type  F90.2     Past Psychiatric History: Please see intake H&P.  Past Medical History:  Past Medical History:  Diagnosis Date  . Anemia   . Anxiety   . Asthma   . Bipolar 1 disorder ([place])   . 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  . Chronic kidney disease   . Depression   . GERD (gastroesophageal reflux disease)   . Molar pregnancy   . Renal calculus or stone    had stent/ lithotripsy in past  . Sleep apnea   .  Substance abuse ([place])   . Suicide attempt ([place])    x 2    Past Surgical History:  Procedure Laterality Date  . CYSTOSCOPY     x2 with stone extraction  . EXTRACORPOREAL SHOCK WAVE LITHOTRIPSY Right 06/20/2013   Procedure: EXTRACORPOREAL SHOCK WAVE LITHOTRIPSY (ESWL);  Surgeon: Edwin Dada, MD;  Location: [facility];  Service: Urology;  Laterality: Right;  . FOOT SURGERY Left 2003  . LITHOTRIPSY    . TUBAL LIGATION   2017  . TUBAL LIGATION      Family Psychiatric History: Reviewed.  Family History:  Family History  Problem Relation Age of Onset  . Depression Mother   . Asthma Mother   . Hypertension Mother   . Mood Disorder Mother   . Heart disease Father   . Kidney disease Father   . Alcoholism Father   . Breast cancer Paternal Aunt   . Breast cancer Maternal Grandmother   . Breast cancer Cousin   . Alcoholism Brother   . Drug abuse Brother     Social History:  Social History   Socioeconomic History  . Marital status: Divorced    Spouse name: Not on file  . Number of children: 3  . Years of education: Not on file  . Highest education level: Not on file  Occupational History  . Not on file  Tobacco Use  . Smoking status: Current Some Day Smoker    Packs/day: 0.25    Types: Cigarettes  . Smokeless tobacco: Never Used  Substance and Sexual Activity  . Alcohol use: Yes    Comment: occ  . Drug use: Not Currently  . Sexual activity: Yes    Birth control/protection: Surgical  Other Topics Concern  . Not on file  Social History Narrative  . Not on file   Social Determinants of Health   Financial Resource Strain:   . Difficulty of Paying Living Expenses: Not on file  Food Insecurity:   . Worried About Charity fundraiser in the Last Year: Not on file  . Ran Out of Food in the Last Year: Not on file  Transportation Needs:   . Lack of Transportation (Medical): Not on file  . Lack of Transportation (Non-Medical): Not on file  Physical Activity:   . Days of Exercise per Week: Not on file  . Minutes of Exercise per Session: Not on file  Stress:   . Feeling of Stress : Not on file  Social Connections:   . Frequency of Communication with Friends and Family: Not on file  . Frequency of Social Gatherings with Friends and Family: Not on file  . Attends Religious Services: Not on file  . Active Member of Clubs or Organizations: Not on file  . Attends Archivist  Meetings: Not on file  . Marital Status: Not on file    Allergies:  Allergies  Allergen Reactions  . Levofloxacin   . Penicillins Hives    No anaphylaxis  Did it involve swelling of the face/tongue/throat, SOB, or low BP? No Did it involve sudden or severe rash/hives, skin peeling, or any reaction on the inside of your mouth or nose? Yes Did you need to seek medical attention at a hospital or doctor's office? Yes When did it last happen?Childhood If all above answers are "NO", may proceed with cephalosporin use.   Marland Kitchen Phenergan [Promethazine Hcl]     Tremors, restless lef, akathesia [id number]    Metabolic Disorder Labs: Lab Results  Component Value  Date   HGBA1C 5.0 03/13/2018   No results found for: PROLACTIN Lab Results  Component Value Date   CHOL 108 03/13/2018   TRIG 45 03/13/2018   HDL 35 (L) 03/13/2018   CHOLHDL 3.1 03/13/2018   [place] 64 03/13/2018   Lab Results  Component Value Date   TSH 1.170 03/13/2018    Therapeutic Level Labs: No results found for: LITHIUM No results found for: VALPROATE No components found for:  CBMZ  Current Medications: Current Outpatient Medications  Medication Sig Dispense Refill  . albuterol (VENTOLIN HFA) 108 (90 Base) MCG/ACT inhaler Inhale 1-2 puffs into the lungs every 6 (six) hours as needed for wheezing or shortness of breath. 18 g 0  . benzonatate (TESSALON) 100 MG capsule Take 1 capsule (100 mg total) by mouth every 8 (eight) hours. (Patient not taking: Reported on 01/15/2019) 21 capsule 0  . cetirizine-pseudoephedrine (ZYRTEC-D) 5-120 MG tablet Take 1 tablet by mouth daily. (Patient not taking: Reported on 01/15/2019) 30 tablet 0  . cyclobenzaprine (FLEXERIL) 10 MG tablet Take 1 tablet (10 mg total) by mouth 2 (two) times daily as needed for muscle spasms. 20 tablet 0  . escitalopram (LEXAPRO) 20 MG tablet Take 1 tablet (20 mg total) by mouth daily. 30 tablet 2  . fluticasone (FLONASE) 50 MCG/ACT nasal spray Place  2 sprays into both nostrils daily. (Patient not taking: Reported on 01/15/2019) 16 g 0  . guaiFENesin (MUCINEX) 600 MG 12 hr tablet Take 1 tablet (600 mg total) by mouth 2 (two) times daily as needed for cough. 30 tablet 0  . ibuprofen (ADVIL) 600 MG tablet Take 1 tablet (600 mg total) by mouth every 6 (six) hours as needed. 30 tablet 0  . lamoTRIgine (LAMICTAL) 200 MG tablet Take 1 tablet (200 mg total) by mouth at bedtime. 30 tablet 0  . sulfamethoxazole-trimethoprim (BACTRIM DS) 800-160 MG tablet Take 1 tablet by mouth 2 (two) times daily. 14 tablet 0  . zolpidem (AMBIEN CR) 12.5 MG CR tablet Take 1 tablet (12.5 mg total) by mouth at bedtime as needed for sleep. 30 tablet 0   No current facility-administered medications for this visit.     Psychiatric Specialty Exam: Review of Systems  Psychiatric/Behavioral: Positive for decreased concentration and sleep disturbance.  All other systems reviewed and are negative.   There were no vitals taken for this visit.There is no height or weight on file to calculate BMI.  General Appearance: NA  Eye Contact:  NA  Speech:  Clear and Coherent and Normal Rate  Volume:  Normal  Mood:  Fluctuates rapidly between depressed and irritable.  Affect:  NA  Thought Process:  Goal Directed and Linear  Orientation:  Full (Time, Place, and Person)  Thought Content: Logical   Suicidal Thoughts:  No  Homicidal Thoughts:  No  Memory:  Immediate;   Fair Recent;   Good Remote;   Good  Judgement:  Fair  Insight:  Fair  Psychomotor Activity:  NA  Concentration:  Concentration: Fair  Recall:  [place] of Knowledge: Fair  Language: Good  Akathisia:  Negative  Handed:  Right  AIMS (if indicated): not done  Assets:  Communication Skills Desire for Improvement Financial Resources/Insurance Housing Resilience Social Support  ADL's:  Intact  Cognition: WNL  Sleep:  Poor   Screenings: GAD-7     Office Visit from 02/20/2018 in [facility] Care at [facility]  Total GAD-7 Score  18    PHQ2-9  Office Visit from 02/20/2018 in Primary Care at [facility] Visit from 05/06/2017 in [facility]  PHQ-2 Total Score  3  2  PHQ-9 Total Score  16  12       Assessment and Plan: 24 yo DWF referred to [place] for treatment of mood disorder and chronic problems with focusing. Kellie Martinez reports being diagnosed with having bipolar disorder and ADHD when she was 46-53 years old. Unfortunately she does not remember medications she has been tried for either of these conditions just says that "there were many". She seems to recall names of Ritalin and Adderall. Available record also shows that she was tried on SSRIs (citalopram, fluoxetine), aripiprazole, oxcarbazepine, hydroxyzine, clonazepam. She describes her mood swings as being very rapid - happening several times during the day. The upswings are described and increased irritability, increased energy, racing thoughts, impulsivity (no euphoria). The longest such an episode can last is less than a day and she denies ever having psychotic symptoms. She also denies having hx of prolonged depressive episodes, suicidal thoughts although she admits OD on meds impulsively out of frustration when she was 13. She did not go to the hospital then and has npo hx of inpatient psychiatric admissions. She has struggled with focusing, task completion, procrastination throughout her school days. It is unclear if she had a component of hyperactivity ("I don't know"). This was accompanied by anxiety as she felt being inadequate, low achieving. She tried to self medicate with marijuana but has not used this drug "for a fe years". She denies abusing other street or prescription drugs. At one time (few years ago) she was also drinking "too much" but she dos not do it any more.I suspect she is more likely to havecombined ADHD than with bipolar 1 disorder - she may have a bipolar spectrum - more closely resembling  bipolar 2 disorder though. She also likely has GAD. Cannabis use disorder is in full remission. WestartedLamictal titration and escitalopram 10 mg daily. Initially she had muscle cramps and some sedation but both have resolved. Anxiety has not improved much so far.a week ago. She called reporting cramps in both legs and arms. It I possible these are side effects of escitalopram. She was initially sedated but this has resolved (again likely due to Lexapro). She also complains of difficulty with falling asleep - trazodone has not been very effective for that (150 mg) and Ambien  Also only helps her fall asleep but she is up after few hours. Her anxiety is now well controlled but mood swings continue.  Dx: Bipolar 2; GAD; ADHD  Plan: We will continue escitalopram to 20 mg for anxiety and increase Lamictal to 200 mg at HS for mood. I will dc regular Ambien and start Ambien CR 12.5 mg prn sleep. We may add Adderall once her mood stabilizes.  Next appointment in one month.The plan was discussed with patient who had an opportunity to ask questions and these were all answered. I spend7minutes in phone consultation with the patient.   Stephanie Acre, MD 03/08/2019, 3:39 PM

## 2019-03-09 ENCOUNTER — Telehealth (HOSPITAL_COMMUNITY): Payer: Self-pay | Admitting: *Deleted

## 2019-03-09 NOTE — Telephone Encounter (Signed)
PA initiated for Ambien Cr 12.5mg  through [facility]. Since medication is a sedative/hypnotic they require a form to be completed and faxed to them. Awaiting psych ian signature so can be faxed.

## 2019-03-12 ENCOUNTER — Other Ambulatory Visit: Payer: Self-pay

## 2019-03-12 ENCOUNTER — Ambulatory Visit (INDEPENDENT_AMBULATORY_CARE_PROVIDER_SITE_OTHER): Payer: Medicaid Other | Admitting: Psychiatry

## 2019-03-12 DIAGNOSIS — F411 Generalized anxiety disorder: Secondary | ICD-10-CM

## 2019-03-12 DIAGNOSIS — F3181 Bipolar II disorder: Secondary | ICD-10-CM | POA: Diagnosis not present

## 2019-03-12 DIAGNOSIS — F902 Attention-deficit hyperactivity disorder, combined type: Secondary | ICD-10-CM | POA: Diagnosis not present
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
## 2019-03-21 ENCOUNTER — Ambulatory Visit (INDEPENDENT_AMBULATORY_CARE_PROVIDER_SITE_OTHER): Payer: Medicaid Other | Admitting: Psychiatry

## 2019-03-21 ENCOUNTER — Other Ambulatory Visit: Payer: Self-pay

## 2019-03-21 DIAGNOSIS — M542 Cervicalgia: Secondary | ICD-10-CM | POA: Diagnosis not present

## 2019-03-21 DIAGNOSIS — F3181 Bipolar II disorder: Secondary | ICD-10-CM | POA: Diagnosis not present

## 2019-03-21 DIAGNOSIS — F411 Generalized anxiety disorder: Secondary | ICD-10-CM

## 2019-03-21 DIAGNOSIS — M545 Low back pain: Secondary | ICD-10-CM | POA: Diagnosis not present

## 2019-03-21 MED ORDER — TEMAZEPAM 15 MG PO CAPS: 15.0000 mg | ORAL_CAPSULE | Freq: Every evening | ORAL | 0 refills | Status: DC | PRN

## 2019-03-21 NOTE — Progress Notes (Signed)
Rodriguez Hevia MD/PA/NP OP Progress Note  03/21/2019 8:36 AM ORIELLE Martinez  MRN:  [id number] Interview was conducted by phone and I verified that I was speaking with the correct person using two identifiers. I discussed the limitations of evaluation and management by telemedicine and  the availability of in person appointments. Patient expressed understanding and agreed to proceed.  Chief Complaint: "I can't sleep but for few hours".  HPI: 24 yo DWF referred to [place] for treatment of mood disorder and chronic problems with focusing. [name] reports being diagnosed with having bipolar disorder and ADHD when she was 16-43 years old. Unfortunately she does not remember medications she has been tried for either of these conditions just says that "there were many". She seems to recall names of Ritalin and Adderall. Available record also shows that she was tried on SSRIs (citalopram, fluoxetine), aripiprazole, oxcarbazepine, hydroxyzine, clonazepam. She describes her mood swings as being very rapid - happening several times during the day. The upswings are described and increased irritability, increased energy, racing thoughts, impulsivity (no euphoria). The longest such an episode can last is less than a day and she denies ever having psychotic symptoms. She also denies having hx of prolonged depressive episodes, suicidal thoughts although she admits OD on meds impulsively out of frustration when she was 13. She did not go to the hospital then and has npo hx of inpatient psychiatric admissions. She tried to self medicate with marijuana but has not used this drug "for a fe years". She denies abusing other street or prescription drugs. At one time (few years ago) she was also drinking "too much" but she dos not do it any more. Cannabis use disorder is in full remission. WestartedLamictal titration and escitalopram 10 mg daily. Initially she had muscle cramps and some sedation but both have resolved. Anxiety has not improved  much so far.a week ago. She called reporting cramps in both legs and arms. It I possible these are side effects of escitalopram. She was initially sedated but this has resolved (again likely due to Lexapro).She also complains of difficulty with falling asleep - trazodone has not been very effective for that (150 mg)and regular Ambien only helps her fall asleep but she is up after few hours.I have ordered Ambien CR but it was not approved by her insurance.   Visit Diagnosis:    ICD-10-CM   1. Bipolar 2 disorder (HCC)  F31.81   2. GAD (generalized anxiety disorder)  F41.1     Past Psychiatric History: Please see intake H&P.  Past Medical History:  Past Medical History:  Diagnosis Date  . Anemia   . Anxiety   . Asthma   . Bipolar 1 disorder ([place])   . 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  . Chronic kidney disease   . Depression   . GERD (gastroesophageal reflux disease)   . Molar pregnancy   . Renal calculus or stone    had stent/ lithotripsy in past  . Sleep apnea   . Substance abuse ([place])   . Suicide attempt ([place])    x 2    Past Surgical History:  Procedure Laterality Date  . CYSTOSCOPY     x2 with stone extraction  . EXTRACORPOREAL SHOCK WAVE LITHOTRIPSY Right 06/20/2013   Procedure: EXTRACORPOREAL SHOCK WAVE LITHOTRIPSY (ESWL);  Surgeon: Edwin Dada, MD;  Location: [facility];  Service: Urology;  Laterality: Right;  . FOOT SURGERY Left 2003  . LITHOTRIPSY    . TUBAL LIGATION  2017  . TUBAL LIGATION  Family Psychiatric History: Reviewed.  Family History:  Family History  Problem Relation Age of Onset  . Depression Mother   . Asthma Mother   . Hypertension Mother   . Mood Disorder Mother   . Heart disease Father   . Kidney disease Father   . Alcoholism Father   . Breast cancer Paternal Aunt   . Breast cancer Maternal Grandmother   . Breast cancer Cousin   . Alcoholism Brother   . Drug abuse Brother     Social History:  Social History   Socioeconomic History  .  Marital status: Divorced    Spouse name: Not on file  . Number of children: 3  . Years of education: Not on file  . Highest education level: Not on file  Occupational History  . Not on file  Tobacco Use  . Smoking status: Current Some Day Smoker    Packs/day: 0.25    Types: Cigarettes  . Smokeless tobacco: Never Used  Substance and Sexual Activity  . Alcohol use: Yes    Comment: occ  . Drug use: Not Currently  . Sexual activity: Yes    Birth control/protection: Surgical  Other Topics Concern  . Not on file  Social History Narrative  . Not on file   Social Determinants of Health   Financial Resource Strain:   . Difficulty of Paying Living Expenses: Not on file  Food Insecurity:   . Worried About Charity fundraiser in the Last Year: Not on file  . Ran Out of Food in the Last Year: Not on file  Transportation Needs:   . Lack of Transportation (Medical): Not on file  . Lack of Transportation (Non-Medical): Not on file  Physical Activity:   . Days of Exercise per Week: Not on file  . Minutes of Exercise per Session: Not on file  Stress:   . Feeling of Stress : Not on file  Social Connections:   . Frequency of Communication with Friends and Family: Not on file  . Frequency of Social Gatherings with Friends and Family: Not on file  . Attends Religious Services: Not on file  . Active Member of Clubs or Organizations: Not on file  . Attends Archivist Meetings: Not on file  . Marital Status: Not on file    Allergies:  Allergies  Allergen Reactions  . Levofloxacin   . Penicillins Hives    No anaphylaxis  Did it involve swelling of the face/tongue/throat, SOB, or low BP? No Did it involve sudden or severe rash/hives, skin peeling, or any reaction on the inside of your mouth or nose? Yes Did you need to seek medical attention at a hospital or doctor's office? Yes When did it last happen?Childhood If all above answers are "NO", may proceed with  cephalosporin use.   Marland Kitchen Phenergan [Promethazine Hcl]     Tremors, restless lef, akathesia [id number]    Metabolic Disorder Labs: Lab Results  Component Value Date   HGBA1C 5.0 03/13/2018   No results found for: PROLACTIN Lab Results  Component Value Date   CHOL 108 03/13/2018   TRIG 45 03/13/2018   HDL 35 (L) 03/13/2018   CHOLHDL 3.1 03/13/2018   [place] 64 03/13/2018   Lab Results  Component Value Date   TSH 1.170 03/13/2018    Therapeutic Level Labs: No results found for: LITHIUM No results found for: VALPROATE No components found for:  CBMZ  Current Medications: Current Outpatient Medications  Medication Sig Dispense Refill  .  albuterol (VENTOLIN HFA) 108 (90 Base) MCG/ACT inhaler Inhale 1-2 puffs into the lungs every 6 (six) hours as needed for wheezing or shortness of breath. 18 g 0  . benzonatate (TESSALON) 100 MG capsule Take 1 capsule (100 mg total) by mouth every 8 (eight) hours. (Patient not taking: Reported on 01/15/2019) 21 capsule 0  . cetirizine-pseudoephedrine (ZYRTEC-D) 5-120 MG tablet Take 1 tablet by mouth daily. (Patient not taking: Reported on 01/15/2019) 30 tablet 0  . cyclobenzaprine (FLEXERIL) 10 MG tablet Take 1 tablet (10 mg total) by mouth 2 (two) times daily as needed for muscle spasms. 20 tablet 0  . escitalopram (LEXAPRO) 20 MG tablet Take 1 tablet (20 mg total) by mouth daily. 30 tablet 2  . fluticasone (FLONASE) 50 MCG/ACT nasal spray Place 2 sprays into both nostrils daily. (Patient not taking: Reported on 01/15/2019) 16 g 0  . guaiFENesin (MUCINEX) 600 MG 12 hr tablet Take 1 tablet (600 mg total) by mouth 2 (two) times daily as needed for cough. 30 tablet 0  . ibuprofen (ADVIL) 600 MG tablet Take 1 tablet (600 mg total) by mouth every 6 (six) hours as needed. 30 tablet 0  . lamoTRIgine (LAMICTAL) 200 MG tablet Take 1 tablet (200 mg total) by mouth at bedtime. 30 tablet 0  . sulfamethoxazole-trimethoprim (BACTRIM DS) 800-160 MG tablet Take 1  tablet by mouth 2 (two) times daily. 14 tablet 0  . temazepam (RESTORIL) 15 MG capsule Take 1 capsule (15 mg total) by mouth at bedtime as needed for sleep. 30 capsule 0   No current facility-administered medications for this visit.      Psychiatric Specialty Exam: Review of Systems  Neurological: Positive for headaches.  Psychiatric/Behavioral: Positive for sleep disturbance.  All other systems reviewed and are negative.   There were no vitals taken for this visit.There is no height or weight on file to calculate BMI.  General Appearance: NA  Eye Contact:  NA  Speech:  Clear and Coherent and Normal Rate  Volume:  Normal  Mood:  Irritable  Affect:  NA  Thought Process:  Goal Directed and Linear  Orientation:  Full (Time, Place, and Person)  Thought Content: Logical   Suicidal Thoughts:  No  Homicidal Thoughts:  No  Memory:  Immediate;   Fair Recent;   Fair Remote;   Fair  Judgement:  Good  Insight:  Fair  Psychomotor Activity:  NA  Concentration:  Concentration: Fair  Recall:  [employer] of Knowledge: Fair  Language: Good  Akathisia:  Negative  Handed:  Right  AIMS (if indicated): not done  Assets:  Communication Skills Desire for Improvement Housing Social Support  ADL's:  Intact  Cognition: WNL  Sleep:  Poor   Screenings: GAD-7     [facility] Visit from 02/20/2018 in [facility]  Total GAD-7 Score  18    PHQ2-9     Office Visit from 02/20/2018 in [place] at [facility] Visit from 05/06/2017 in [place]  PHQ-2 Total Score  3  2  PHQ-9 Total Score  16  12       Assessment and Plan: 24 yo DWF referred to [place] for treatment of mood disorder and chronic problems with focusing. Maleah reports being diagnosed with having bipolar disorder and ADHD when she was 39-70 years old. Unfortunately she does not remember medications she has been tried for either of these conditions just says that "there were many". She  seems to  recall names of Ritalin and Adderall. Available record also shows that she was tried on SSRIs (citalopram, fluoxetine), aripiprazole, oxcarbazepine, hydroxyzine, clonazepam. She describes her mood swings as being very rapid - happening several times during the day. The upswings are described and increased irritability, increased energy, racing thoughts, impulsivity (no euphoria). The longest such an episode can last is less than a day and she denies ever having psychotic symptoms. She also denies having hx of prolonged depressive episodes, suicidal thoughts although she admits OD on meds impulsively out of frustration when she was 13. She did not go to the hospital then and has npo hx of inpatient psychiatric admissions. She tried to self medicate with marijuana but has not used this drug "for a fe years". She denies abusing other street or prescription drugs. At one time (few years ago) she was also drinking "too much" but she dos not do it any more. Cannabis use disorder is in full remission. WestartedLamictal titration and escitalopram 10 mg daily. Initially she had muscle cramps and some sedation but both have resolved. Anxiety has not improved much so far.a week ago. She called reporting cramps in both legs and arms. It I possible these are side effects of escitalopram. She was initially sedated but this has resolved (again likely due to Lexapro).She also complains of difficulty with falling asleep - trazodone has not been very effective for that (150 mg)and regular Ambien only helps her fall asleep but she is up after few hours.I have ordered Ambien CR but it was not approved by her insurance.   Dx: Bipolar 2; GAD; ADHD  Plan: We willcontinueescitalopram to20mg  for anxiety, Lamictal to200 mg at HS for mood and start temazepam 15-30 mg prn insomnia. We may add Adderallonce hermood stabilizes.Next appointment in 3 weeks.The plan was discussed with patient who had an opportunity to  ask questions and these were all answered. I spend24minutes inphoneconsultation with the patient.    Stephanie Acre, MD 03/21/2019, 8:36 AM

## 2019-03-22 ENCOUNTER — Telehealth: Payer: Self-pay | Admitting: *Deleted

## 2019-03-22 ENCOUNTER — Ambulatory Visit: Payer: Medicaid Other | Admitting: Neurology

## 2019-03-22 NOTE — Telephone Encounter (Signed)
Pt no showed new patient appt today. This is unfortunately her second no show in the past year amongst a cancellation.

## 2019-03-26 ENCOUNTER — Encounter: Payer: Self-pay | Admitting: Neurology

## 2019-03-29 ENCOUNTER — Ambulatory Visit: Payer: Medicaid Other | Admitting: Gastroenterology

## 2019-04-05 ENCOUNTER — Other Ambulatory Visit (HOSPITAL_COMMUNITY): Payer: Self-pay | Admitting: Psychiatry

## 2019-04-05 ENCOUNTER — Ambulatory Visit: Payer: Medicaid Other | Admitting: Neurology

## 2019-04-09 ENCOUNTER — Ambulatory Visit (INDEPENDENT_AMBULATORY_CARE_PROVIDER_SITE_OTHER): Payer: Medicaid Other | Admitting: Psychiatry

## 2019-04-09 ENCOUNTER — Other Ambulatory Visit: Payer: Self-pay

## 2019-04-09 DIAGNOSIS — F411 Generalized anxiety disorder: Secondary | ICD-10-CM

## 2019-04-09 DIAGNOSIS — F3181 Bipolar II disorder: Secondary | ICD-10-CM

## 2019-04-09 DIAGNOSIS — F902 Attention-deficit hyperactivity disorder, combined type: Secondary | ICD-10-CM | POA: Diagnosis not present

## 2019-04-09 MED ORDER — LAMOTRIGINE 200 MG PO TABS: 200.0000 mg | ORAL_TABLET | Freq: Every day | ORAL | 0 refills | Status: DC

## 2019-04-09 MED ORDER — TEMAZEPAM 15 MG PO CAPS: 15.0000 mg | ORAL_CAPSULE | Freq: Every evening | ORAL | 0 refills | Status: DC | PRN

## 2019-04-09 NOTE — Progress Notes (Signed)
[facility] MD/PA/NP OP Progress Note  04/09/2019 10:14 AM Kellie Martinez  MRN:  [id number] Interview was conducted by phone and I verified that I was speaking with the correct person using two identifiers. I discussed the limitations of evaluation and management by telemedicine and  the availability of in person appointments. Patient expressed understanding and agreed to proceed.  Chief Complaint: "I sleep better".  HPI: 24 yo DWF referred to [place] for treatment of mood disorder and chronic problems with focusing. Kellie Martinez reports being diagnosed with having bipolar disorder and ADHD when she was 84-15 years old. Unfortunately she does not remember medications she has been tried for either of these conditions just says that "there were many". She seems to recall names of Ritalin and Adderall. Available record also shows that she was tried on SSRIs (citalopram, fluoxetine), aripiprazole, oxcarbazepine, hydroxyzine, clonazepam. She describes her mood swings as being very rapid - happening several times during the day. The upswings are described and increased irritability, increased energy, racing thoughts, impulsivity (no euphoria). The longest such an episode can last is less than a day and she denies ever having psychotic symptoms. She also denies having hx of prolonged depressive episodes, suicidal thoughts although she admits OD on meds impulsively out of frustration when she was 13. She did not go to the hospital then and has npo hx of inpatient psychiatric admissions. She tried to self medicate with marijuana but has not used this drug "for a few years". She denies abusing other street or prescription drugs. At one time (few years ago) she was also drinking "too much" but she dos not do it any more. Cannabis use disorder is in full remission. WestartedLamictal titration and escitalopram 10 mg daily. Initially she had muscle cramps and some sedation but both have resolved. She also complained of difficulty with  falling asleep - trazodone has not been very effective for that (150 mg)andregularAmbien only helps her fall asleep but she is up after few hours.I have ordered Ambien CR but it was not approved by her insurance. For the past few weeks she has been taking temazepam and it seems to be more effective (sleeps 5-6 hours on it). She was told that all her meds need to be first approved by Medicaid before they will be covered? She provided [place] with a phone number to call which was included in a letter from [facility] [phone] with her ID of [id number]. She would like to apply for disability (does not feel able to work - be employed due to high anxiety, fluctuating mood, focusing problems).  Visit Diagnosis:    ICD-10-CM   1. Bipolar 2 disorder (HCC)  F31.81   2. Attention deficit hyperactivity disorder (ADHD), combined type  F90.2   3. GAD (generalized anxiety disorder)  F41.1     Past Psychiatric History: Please see intake H&P.  Past Medical History:  Past Medical History:  Diagnosis Date  . Anemia   . Anxiety   . Asthma   . Bipolar 1 disorder ([place])   . 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  . Chronic kidney disease   . Depression   . GERD (gastroesophageal reflux disease)   . Molar pregnancy   . Renal calculus or stone    had stent/ lithotripsy in past  . Sleep apnea   . Substance abuse ([place])   . Suicide attempt ([place])    x 2    Past Surgical History:  Procedure Laterality Date  . CYSTOSCOPY     x2 with stone extraction  .  EXTRACORPOREAL SHOCK WAVE LITHOTRIPSY Right 06/20/2013   Procedure: EXTRACORPOREAL SHOCK WAVE LITHOTRIPSY (ESWL);  Surgeon: Edwin Dada, MD;  Location: [facility];  Service: Urology;  Laterality: Right;  . FOOT SURGERY Left 2003  . LITHOTRIPSY    . TUBAL LIGATION  2017  . TUBAL LIGATION      Family Psychiatric History: Reviewed.  Family History:  Family History  Problem Relation Age of Onset  . Depression Mother   . Asthma Mother   . Hypertension Mother   . Mood  Disorder Mother   . Heart disease Father   . Kidney disease Father   . Alcoholism Father   . Breast cancer Paternal Aunt   . Breast cancer Maternal Grandmother   . Breast cancer Cousin   . Alcoholism Brother   . Drug abuse Brother     Social History:  Social History   Socioeconomic History  . Marital status: Divorced    Spouse name: Not on file  . Number of children: 3  . Years of education: Not on file  . Highest education level: Not on file  Occupational History  . Not on file  Tobacco Use  . Smoking status: Current Some Day Smoker    Packs/day: 0.25    Types: Cigarettes  . Smokeless tobacco: Never Used  Substance and Sexual Activity  . Alcohol use: Yes    Comment: occ  . Drug use: Not Currently  . Sexual activity: Yes    Birth control/protection: Surgical  Other Topics Concern  . Not on file  Social History Narrative  . Not on file   Social Determinants of Health   Financial Resource Strain:   . Difficulty of Paying Living Expenses: Not on file  Food Insecurity:   . Worried About Charity fundraiser in the Last Year: Not on file  . Ran Out of Food in the Last Year: Not on file  Transportation Needs:   . Lack of Transportation (Medical): Not on file  . Lack of Transportation (Non-Medical): Not on file  Physical Activity:   . Days of Exercise per Week: Not on file  . Minutes of Exercise per Session: Not on file  Stress:   . Feeling of Stress : Not on file  Social Connections:   . Frequency of Communication with Friends and Family: Not on file  . Frequency of Social Gatherings with Friends and Family: Not on file  . Attends Religious Services: Not on file  . Active Member of Clubs or Organizations: Not on file  . Attends Archivist Meetings: Not on file  . Marital Status: Not on file    Allergies:  Allergies  Allergen Reactions  . Levofloxacin   . Penicillins Hives    No anaphylaxis  Did it involve swelling of the face/tongue/throat, SOB,  or low BP? No Did it involve sudden or severe rash/hives, skin peeling, or any reaction on the inside of your mouth or nose? Yes Did you need to seek medical attention at a hospital or doctor's office? Yes When did it last happen?Childhood If all above answers are "NO", may proceed with cephalosporin use.   Marland Kitchen Phenergan [Promethazine Hcl]     Tremors, restless lef, akathesia [id number]    Metabolic Disorder Labs: Lab Results  Component Value Date   HGBA1C 5.0 03/13/2018   No results found for: PROLACTIN Lab Results  Component Value Date   CHOL 108 03/13/2018   TRIG 45 03/13/2018   HDL 35 (L) 03/13/2018  CHOLHDL 3.1 03/13/2018   LDLCALC 64 03/13/2018   Lab Results  Component Value Date   TSH 1.170 03/13/2018    Therapeutic Level Labs: No results found for: LITHIUM No results found for: VALPROATE No components found for:  CBMZ  Current Medications: Current Outpatient Medications  Medication Sig Dispense Refill  . albuterol (VENTOLIN HFA) 108 (90 Base) MCG/ACT inhaler Inhale 1-2 puffs into the lungs every 6 (six) hours as needed for wheezing or shortness of breath. 18 g 0  . benzonatate (TESSALON) 100 MG capsule Take 1 capsule (100 mg total) by mouth every 8 (eight) hours. (Patient not taking: Reported on 01/15/2019) 21 capsule 0  . cetirizine-pseudoephedrine (ZYRTEC-D) 5-120 MG tablet Take 1 tablet by mouth daily. (Patient not taking: Reported on 01/15/2019) 30 tablet 0  . cyclobenzaprine (FLEXERIL) 10 MG tablet Take 1 tablet (10 mg total) by mouth 2 (two) times daily as needed for muscle spasms. 20 tablet 0  . escitalopram (LEXAPRO) 20 MG tablet Take 1 tablet (20 mg total) by mouth daily. 30 tablet 2  . fluticasone (FLONASE) 50 MCG/ACT nasal spray Place 2 sprays into both nostrils daily. (Patient not taking: Reported on 01/15/2019) 16 g 0  . guaiFENesin (MUCINEX) 600 MG 12 hr tablet Take 1 tablet (600 mg total) by mouth 2 (two) times daily as needed for cough. 30 tablet 0   . ibuprofen (ADVIL) 600 MG tablet Take 1 tablet (600 mg total) by mouth every 6 (six) hours as needed. 30 tablet 0  . [START ON 05/03/2019] lamoTRIgine (LAMICTAL) 200 MG tablet Take 1 tablet (200 mg total) by mouth at bedtime. 30 tablet 0  . sulfamethoxazole-trimethoprim (BACTRIM DS) 800-160 MG tablet Take 1 tablet by mouth 2 (two) times daily. 14 tablet 0  . temazepam (RESTORIL) 15 MG capsule Take 1 capsule (15 mg total) by mouth at bedtime as needed and may repeat dose one time if needed for sleep. 30 capsule 0   No current facility-administered medications for this visit.       Psychiatric Specialty Exam: Review of Systems  Psychiatric/Behavioral: Positive for decreased concentration and sleep disturbance. The patient is nervous/anxious.   All other systems reviewed and are negative.   There were no vitals taken for this visit.There is no height or weight on file to calculate BMI.  General Appearance: NA  Eye Contact:  NA  Speech:  Clear and Coherent and Normal Rate  Volume:  Normal  Mood:  Anxious  Affect:  NA  Thought Process:  Descriptions of Associations: Circumstantial  Orientation:  Full (Time, Place, and Person)  Thought Content: Logical   Suicidal Thoughts:  No  Homicidal Thoughts:  No  Memory:  Immediate;   Fair Recent;   Fair Remote;   Fair  Judgement:  Fair  Insight:  Fair  Psychomotor Activity:  NA  Concentration:  Concentration: Fair  Recall:  [employer] of Knowledge: Fair  Language: Fair  Akathisia:  Negative  Handed:  Right  AIMS (if indicated): not done  Assets:  Communication Skills Desire for Improvement Housing Resilience  ADL's:  Intact  Cognition: WNL  Sleep:  Fair   Screenings: GAD-7     Office Visit from 02/20/2018 in [facility] at [facility]  Total GAD-7 Score  18    PHQ2-9     Office Visit from 02/20/2018 in [place] at [facility] Visit from 05/06/2017 in [place]  PHQ-2 Total Score  3  2   PHQ-9 Total  Score  16  12       Assessment and Plan: 24 yo DWF referred to [place] for treatment of mood disorder and chronic problems with focusing. Kellie Martinez reports being diagnosed with having bipolar disorder and ADHD when she was 12-62 years old. Unfortunately she does not remember medications she has been tried for either of these conditions just says that "there were many". She seems to recall names of Ritalin and Adderall. Available record also shows that she was tried on SSRIs (citalopram, fluoxetine), aripiprazole, oxcarbazepine, hydroxyzine, clonazepam. She describes her mood swings as being very rapid - happening several times during the day. The upswings are described and increased irritability, increased energy, racing thoughts, impulsivity (no euphoria). The longest such an episode can last is less than a day and she denies ever having psychotic symptoms. She also denies having hx of prolonged depressive episodes, suicidal thoughts although she admits OD on meds impulsively out of frustration when she was 13. She did not go to the hospital then and has npo hx of inpatient psychiatric admissions. She tried to self medicate with marijuana but has not used this drug "for a few years". She denies abusing other street or prescription drugs. At one time (few years ago) she was also drinking "too much" but she dos not do it any more. Cannabis use disorder is in full remission. WestartedLamictal titration and escitalopram 10 mg daily. Initially she had muscle cramps and some sedation but both have resolved. She also complained of difficulty with falling asleep - trazodone has not been very effective for that (150 mg)andregularAmbien only helps her fall asleep but she is up after few hours.I have ordered Ambien CR but it was not approved by her insurance. For the past few weeks she has been taking temazepam and it seems to be more effective (sleeps 5-6 hours on it). She was told that all her meds need to  be first approved by Medicaid before they will be covered? She provided [place] with a phone number to call which was included in a letter from [facility] [phone] with her ID of [id number]. She would like to apply for disability (does not feel able to work - be employed due to high anxiety, fluctuating mood, focusing problems).  Dx: Bipolar 2; GAD; ADHD  Plan: We willcontinueescitalopram to20mg  for anxiety,Lamictal to200 mg at HS for mood and temazepam 15-30 mg prn insomnia. We may add Adderallonce hermood stabilizes further.Next appointment in 1 month.The plan was discussed with patient who had an opportunity to ask questions and these were all answered. I spend57minutes inphoneconsultation with the patient.    Stephanie Acre, MD 04/09/2019, 10:14 AM

## 2019-04-18 DIAGNOSIS — M542 Cervicalgia: Secondary | ICD-10-CM | POA: Diagnosis not present

## 2019-04-30 ENCOUNTER — Ambulatory Visit: Payer: Medicaid Other | Admitting: Gastroenterology

## 2019-04-30 NOTE — Progress Notes (Signed)
No show letter sent.

## 2019-05-09 DIAGNOSIS — M25522 Pain in left elbow: Secondary | ICD-10-CM | POA: Diagnosis not present

## 2019-05-09 DIAGNOSIS — M542 Cervicalgia: Secondary | ICD-10-CM | POA: Diagnosis not present

## 2019-05-09 DIAGNOSIS — M25521 Pain in right elbow: Secondary | ICD-10-CM | POA: Diagnosis not present

## 2019-05-10 ENCOUNTER — Other Ambulatory Visit: Payer: Self-pay

## 2019-05-10 ENCOUNTER — Ambulatory Visit (INDEPENDENT_AMBULATORY_CARE_PROVIDER_SITE_OTHER): Payer: Medicaid Other | Admitting: Psychiatry

## 2019-05-10 DIAGNOSIS — F411 Generalized anxiety disorder: Secondary | ICD-10-CM

## 2019-05-10 DIAGNOSIS — F902 Attention-deficit hyperactivity disorder, combined type: Secondary | ICD-10-CM | POA: Diagnosis not present

## 2019-05-10 DIAGNOSIS — F3181 Bipolar II disorder: Secondary | ICD-10-CM | POA: Diagnosis not present

## 2019-05-10 DIAGNOSIS — F17210 Nicotine dependence, cigarettes, uncomplicated: Secondary | ICD-10-CM | POA: Diagnosis not present

## 2019-05-10 MED ORDER — LAMOTRIGINE 150 MG PO TABS: 150.0000 mg | ORAL_TABLET | Freq: Two times a day (BID) | ORAL | 2 refills | Status: DC

## 2019-05-10 MED ORDER — AMPHETAMINE-DEXTROAMPHET ER 20 MG PO CP24: 20.0000 mg | ORAL_CAPSULE | ORAL | 0 refills | Status: DC

## 2019-05-10 MED ORDER — ESCITALOPRAM OXALATE 20 MG PO TABS: 20.0000 mg | ORAL_TABLET | Freq: Every day | ORAL | 2 refills | Status: DC

## 2019-05-10 MED ORDER — TEMAZEPAM 30 MG PO CAPS: 30.0000 mg | ORAL_CAPSULE | Freq: Every evening | ORAL | 2 refills | Status: DC | PRN

## 2019-05-10 NOTE — Progress Notes (Signed)
[place] MD/PA/NP OP Progress Note  05/10/2019 10:13 AM Kellie Martinez  MRN:  [id number] Interview was conducted by phone and I verified that I was speaking with the correct person using two identifiers. I discussed the limitations of evaluation and management by telemedicine and  the availability of in person appointments. Patient expressed understanding and agreed to proceed.  Chief Complaint: Lack of focus, poor sleep.  HPI: 24 yo DWF referred to [place] for treatment of mood disorder and chronic problems with focusing. Kellie Martinez reports being diagnosed with having bipolar disorder and ADHD when she was 13-37 years old. Unfortunately she does not remember medications she has been tried for either of these conditions just says that "there were many". She seems to recall names of Ritalin and Adderall. Available record also shows that she was tried on SSRIs (citalopram, fluoxetine), aripiprazole, oxcarbazepine, hydroxyzine, clonazepam. She describes her mood swings as being very rapid - happening several times during the day. The upswings are described and increased irritability, increased energy, racing thoughts, impulsivity (no euphoria). The longest such an episode can last is less than a day and she denies ever having psychotic symptoms. She also denies having hx of prolonged depressive episodes, suicidal thoughts although she admits OD on meds impulsively out of frustration when she was 13. She did not go to the hospital then and has npo hx of inpatient psychiatric admissions. She tried to self medicate with marijuana but has not used this drug "for a few years". She denies abusing other street or prescription drugs. At one time (few years ago) she was also drinking "too much" but she dos not do it any more. Cannabis use disorder is in full remission. WestartedLamictal titration and escitalopram 10 mg daily. Initially she had muscle cramps and some sedation but both have resolved. She also complained of difficulty  with falling asleep - trazodone has not been very effective for that (150 mg)andregularAmbien only helps her fall asleep but she is up after few hours.I have ordered Ambien CRbut it was not approved by her insurance.For the past few weeks she has been taking temazepam and it seems to be more effective (sleeps 5-6 hours on it).    Visit Diagnosis:    ICD-10-CM   1. Bipolar 2 disorder (HCC)  F31.81   2. Attention deficit hyperactivity disorder (ADHD), combined type  F90.2   3. GAD (generalized anxiety disorder)  F41.1     Past Psychiatric History: Please see intake H&P.  Past Medical History:  Past Medical History:  Diagnosis Date  . Anemia   . Anxiety   . Asthma   . Bipolar 1 disorder ([place])   . 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  . Chronic kidney disease   . Depression   . GERD (gastroesophageal reflux disease)   . Molar pregnancy   . Renal calculus or stone    had stent/ lithotripsy in past  . Sleep apnea   . Substance abuse ([place])   . Suicide attempt ([place])    x 2    Past Surgical History:  Procedure Laterality Date  . CYSTOSCOPY     x2 with stone extraction  . EXTRACORPOREAL SHOCK WAVE LITHOTRIPSY Right 06/20/2013   Procedure: EXTRACORPOREAL SHOCK WAVE LITHOTRIPSY (ESWL);  Surgeon: Edwin Dada, MD;  Location: [facility];  Service: Urology;  Laterality: Right;  . FOOT SURGERY Left 2003  . LITHOTRIPSY    . TUBAL LIGATION  2017  . TUBAL LIGATION      Family Psychiatric History: Reviewed.  Family History:  Family History  Problem Relation Age of Onset  . Depression Mother   . Asthma Mother   . Hypertension Mother   . Mood Disorder Mother   . Heart disease Father   . Kidney disease Father   . Alcoholism Father   . Breast cancer Paternal Aunt   . Breast cancer Maternal Grandmother   . Breast cancer Cousin   . Alcoholism Brother   . Drug abuse Brother     Social History:  Social History   Socioeconomic History  . Marital status: Divorced    Spouse name: Not on file   . Number of children: 3  . Years of education: Not on file  . Highest education level: Not on file  Occupational History  . Not on file  Tobacco Use  . Smoking status: Current Some Day Smoker    Packs/day: 0.25    Types: Cigarettes  . Smokeless tobacco: Never Used  Substance and Sexual Activity  . Alcohol use: Yes    Comment: occ  . Drug use: Not Currently  . Sexual activity: Yes    Birth control/protection: Surgical  Other Topics Concern  . Not on file  Social History Narrative  . Not on file   Social Determinants of Health   Financial Resource Strain:   . Difficulty of Paying Living Expenses:   Food Insecurity:   . Worried About Charity fundraiser in the Last Year:   . Arboriculturist in the Last Year:   Transportation Needs:   . Film/video editor (Medical):   Marland Kitchen Lack of Transportation (Non-Medical):   Physical Activity:   . Days of Exercise per Week:   . Minutes of Exercise per Session:   Stress:   . Feeling of Stress :   Social Connections:   . Frequency of Communication with Friends and Family:   . Frequency of Social Gatherings with Friends and Family:   . Attends Religious Services:   . Active Member of Clubs or Organizations:   . Attends Archivist Meetings:   Marland Kitchen Marital Status:     Allergies:  Allergies  Allergen Reactions  . Levofloxacin   . Penicillins Hives    No anaphylaxis  Did it involve swelling of the face/tongue/throat, SOB, or low BP? No Did it involve sudden or severe rash/hives, skin peeling, or any reaction on the inside of your mouth or nose? Yes Did you need to seek medical attention at a hospital or doctor's office? Yes When did it last happen?Childhood If all above answers are "NO", may proceed with cephalosporin use.   Marland Kitchen Phenergan [Promethazine Hcl]     Tremors, restless lef, akathesia 123XX123    Metabolic Disorder Labs: Lab Results  Component Value Date   HGBA1C 5.0 03/13/2018   No results found  for: PROLACTIN Lab Results  Component Value Date   CHOL 108 03/13/2018   TRIG 45 03/13/2018   HDL 35 (L) 03/13/2018   CHOLHDL 3.1 03/13/2018   [place] 64 03/13/2018   Lab Results  Component Value Date   TSH 1.170 03/13/2018    Therapeutic Level Labs: No results found for: LITHIUM No results found for: VALPROATE No components found for:  CBMZ  Current Medications: Current Outpatient Medications  Medication Sig Dispense Refill  . albuterol (VENTOLIN HFA) 108 (90 Base) MCG/ACT inhaler Inhale 1-2 puffs into the lungs every 6 (six) hours as needed for wheezing or shortness of breath. 18 g 0  . amphetamine-dextroamphetamine (ADDERALL XR) 20  MG 24 hr capsule Take 1 capsule (20 mg total) by mouth every morning. 30 capsule 0  . [START ON 06/09/2019] amphetamine-dextroamphetamine (ADDERALL XR) 20 MG 24 hr capsule Take 1 capsule (20 mg total) by mouth every morning. 30 capsule 0  . [START ON 07/09/2019] amphetamine-dextroamphetamine (ADDERALL XR) 20 MG 24 hr capsule Take 1 capsule (20 mg total) by mouth every morning. 30 capsule 0  . benzonatate (TESSALON) 100 MG capsule Take 1 capsule (100 mg total) by mouth every 8 (eight) hours. (Patient not taking: Reported on 01/15/2019) 21 capsule 0  . cetirizine-pseudoephedrine (ZYRTEC-D) 5-120 MG tablet Take 1 tablet by mouth daily. (Patient not taking: Reported on 01/15/2019) 30 tablet 0  . cyclobenzaprine (FLEXERIL) 10 MG tablet Take 1 tablet (10 mg total) by mouth 2 (two) times daily as needed for muscle spasms. 20 tablet 0  . escitalopram (LEXAPRO) 20 MG tablet Take 1 tablet (20 mg total) by mouth daily. 30 tablet 2  . fluticasone (FLONASE) 50 MCG/ACT nasal spray Place 2 sprays into both nostrils daily. (Patient not taking: Reported on 01/15/2019) 16 g 0  . guaiFENesin (MUCINEX) 600 MG 12 hr tablet Take 1 tablet (600 mg total) by mouth 2 (two) times daily as needed for cough. 30 tablet 0  . ibuprofen (ADVIL) 600 MG tablet Take 1 tablet (600 mg total) by  mouth every 6 (six) hours as needed. 30 tablet 0  . lamoTRIgine (LAMICTAL) 150 MG tablet Take 1 tablet (150 mg total) by mouth 2 (two) times daily. 60 tablet 2  . sulfamethoxazole-trimethoprim (BACTRIM DS) 800-160 MG tablet Take 1 tablet by mouth 2 (two) times daily. 14 tablet 0  . temazepam (RESTORIL) 30 MG capsule Take 1 capsule (30 mg total) by mouth at bedtime as needed for sleep. 30 capsule 2   No current facility-administered medications for this visit.    Psychiatric Specialty Exam: Review of Systems  Psychiatric/Behavioral: Positive for decreased concentration and sleep disturbance. The patient is hyperactive.   All other systems reviewed and are negative.   There were no vitals taken for this visit.There is no height or weight on file to calculate BMI.  General Appearance: NA  Eye Contact:  NA  Speech:  Clear and Coherent and Normal Rate  Volume:  Normal  Mood:  Irritable  Affect:  NA  Thought Process:  Goal Directed  Orientation:  Full (Time, Place, and Person)  Thought Content: Logical   Suicidal Thoughts:  No  Homicidal Thoughts:  No  Memory:  Immediate;   Fair Recent;   Fair Remote;   Fair  Judgement:  Good  Insight:  Fair  Psychomotor Activity:  NA  Concentration:  Concentration: Poor  Recall:  [place] of Knowledge: Fair  Language: Good  Akathisia:  Negative  Handed:  Right  AIMS (if indicated): not done  Assets:  Communication Skills Desire for Improvement Housing Physical Health Resilience  ADL's:  Intact  Cognition: WNL  Sleep:  Fair   Screenings: GAD-7     Office Visit from 02/20/2018 in [facility] at [facility]  Total GAD-7 Score  18    PHQ2-9     Office Visit from 02/20/2018 in [place] at [facility] Visit from 05/06/2017 in [place]  PHQ-2 Total Score  3  2  PHQ-9 Total Score  16  12       Assessment and Plan: 24 yo DWF referred to [place] for treatment of mood disorder and chronic problems with  focusing. Kellie Martinez reports being diagnosed with having bipolar disorder and ADHD when she was 45-43 years old. Unfortunately she does not remember medications she has been tried for either of these conditions just says that "there were many". She seems to recall names of Ritalin and Adderall. Available record also shows that she was tried on SSRIs (citalopram, fluoxetine), aripiprazole, oxcarbazepine, hydroxyzine, clonazepam. She describes her mood swings as being very rapid - happening several times during the day. The upswings are described and increased irritability, increased energy, racing thoughts, impulsivity (no euphoria). The longest such an episode can last is less than a day and she denies ever having psychotic symptoms. She also denies having hx of prolonged depressive episodes, suicidal thoughts although she admits OD on meds impulsively out of frustration when she was 13. She did not go to the hospital then and has npo hx of inpatient psychiatric admissions. She tried to self medicate with marijuana but has not used this drug "for a few years". She denies abusing other street or prescription drugs. At one time (few years ago) she was also drinking "too much" but she dos not do it any more. Cannabis use disorder is in full remission. WestartedLamictal titration and escitalopram 10 mg daily. Initially she had muscle cramps and some sedation but both have resolved. She also complained of difficulty with falling asleep - trazodone has not been very effective for that (150 mg)andregularAmbien only helps her fall asleep but she is up after few hours.I have ordered Ambien CRbut it was not approved by her insurance.For the past few weeks she has been taking temazepam and it seems to be more effective (sleeps 5-6 hours on it).   Dx: Bipolar 2 ultra rapid cycling; GAD; ADHD  Plan: We willcontinueescitalopram to20mg  for anxiety,change Lamictal to150 mg bid (co of more "mmodiness" during the  day, increase temazepam to 30 mg prn insomnia.We will try AdderallXR 20 mg for concentration problems.Next appointment in5 weeks.The plan was discussed with patient who had an opportunity to ask questions and these were all answered. I spend63minutes inphoneconsultation with the patient.    Stephanie Acre, MD 05/10/2019, 10:13 AM

## 2019-05-11 DIAGNOSIS — M542 Cervicalgia: Secondary | ICD-10-CM | POA: Diagnosis not present

## 2019-06-04 DIAGNOSIS — M542 Cervicalgia: Secondary | ICD-10-CM | POA: Diagnosis not present

## 2019-06-07 ENCOUNTER — Telehealth (INDEPENDENT_AMBULATORY_CARE_PROVIDER_SITE_OTHER): Payer: Medicaid Other | Admitting: Psychiatry

## 2019-06-07 ENCOUNTER — Other Ambulatory Visit: Payer: Self-pay

## 2019-06-07 DIAGNOSIS — F3181 Bipolar II disorder: Secondary | ICD-10-CM | POA: Diagnosis not present

## 2019-06-07 DIAGNOSIS — F411 Generalized anxiety disorder: Secondary | ICD-10-CM | POA: Diagnosis not present

## 2019-06-07 DIAGNOSIS — F902 Attention-deficit hyperactivity disorder, combined type: Secondary | ICD-10-CM | POA: Diagnosis not present

## 2019-06-07 MED ORDER — AMPHETAMINE-DEXTROAMPHET ER 30 MG PO CP24: 30.0000 mg | ORAL_CAPSULE | ORAL | 0 refills | Status: DC

## 2019-06-07 NOTE — Progress Notes (Signed)
[place] MD/PA/NP OP Progress Note  06/07/2019 10:37 AM BRENLEE FIX  MRN:  [id number] Interview was conducted by phone and I verified that I was speaking with the correct person using two identifiers. I discussed the limitations of evaluation and management by telemedicine and  the availability of in person appointments. Patient expressed understanding and agreed to proceed.  Chief Complaint: "Adderall worked well but it may need to be increased".  HPI:  24 yo DWF with bipolar disorder and ADHD (the latter diagnosed when she was 26-21 years old). Unfortunately she does not remember medications she has been tried for either of these conditions just says that "there were many". She seems to recall names of Ritalin and Adderall. Available record also shows that she was tried on SSRIs (citalopram, fluoxetine), aripiprazole, oxcarbazepine, hydroxyzine, clonazepam. She describes her mood swings as being very rapid - happening several times during the day. The upswings are described and increased irritability, increased energy, racing thoughts, impulsivity (no euphoria). The longest such an episode can last is less than a day and she denies ever having psychotic symptoms. She also denies having hx of prolonged depressive episodes, suicidal thoughts although she admits OD on meds impulsively out of frustration when she was 13. She did not go to the hospital then and has npo hx of inpatient psychiatric admissions. She tried to self medicate with marijuana but has not used this drug "for a fewyears". She denies abusing other street or prescription drugs. At one time (few years ago) she was also drinking "too much" but she dos not do it any more. Cannabis use disorder is in full remission. WestartedLamictal titration and escitalopram 10 mg daily. Initially she had muscle cramps and some sedation but both have resolved.She also complainedof difficulty with falling asleep - trazodone has not been very effective for that  (150 mg)andregularAmbien only helps her fall asleep but she is up after few hours.I have ordered Ambien CRbut it was not approved by her insurance.For the past few weeks she has been taking temazepam and it seems to be more effective (sleeps 5-6 hours on it). We have added Adderall XR 20 mg nd it is helpful for concentration but not yet optimal.   Visit Diagnosis:    ICD-10-CM   1. Attention deficit hyperactivity disorder (ADHD), combined type  F90.2   2. Bipolar 2 disorder ([place])  F31.81   3. GAD (generalized anxiety disorder)  F41.1     Past Psychiatric History: Please see intake H&P.  Past Medical History:  Past Medical History:  Diagnosis Date  . Anemia   . Anxiety   . Asthma   . Bipolar 1 disorder ([place])   . 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  . Chronic kidney disease   . Depression   . GERD (gastroesophageal reflux disease)   . Molar pregnancy   . Renal calculus or stone    had stent/ lithotripsy in past  . Sleep apnea   . Substance abuse ([place])   . Suicide attempt ([place])    x 2    Past Surgical History:  Procedure Laterality Date  . CYSTOSCOPY     x2 with stone extraction  . EXTRACORPOREAL SHOCK WAVE LITHOTRIPSY Right 06/20/2013   Procedure: EXTRACORPOREAL SHOCK WAVE LITHOTRIPSY (ESWL);  Surgeon: Edwin Dada, MD;  Location: [facility];  Service: Urology;  Laterality: Right;  . FOOT SURGERY Left 2003  . LITHOTRIPSY    . TUBAL LIGATION  2017  . TUBAL LIGATION      Family Psychiatric History:  Reviewed.  Family History:  Family History  Problem Relation Age of Onset  . Depression Mother   . Asthma Mother   . Hypertension Mother   . Mood Disorder Mother   . Heart disease Father   . Kidney disease Father   . Alcoholism Father   . Breast cancer Paternal Aunt   . Breast cancer Maternal Grandmother   . Breast cancer Cousin   . Alcoholism Brother   . Drug abuse Brother     Social History:  Social History   Socioeconomic History  . Marital status: Divorced    Spouse  name: Not on file  . Number of children: 3  . Years of education: Not on file  . Highest education level: Not on file  Occupational History  . Not on file  Tobacco Use  . Smoking status: Current Some Day Smoker    Packs/day: 0.25    Types: Cigarettes  . Smokeless tobacco: Never Used  Substance and Sexual Activity  . Alcohol use: Yes    Comment: occ  . Drug use: Not Currently  . Sexual activity: Yes    Birth control/protection: Surgical  Other Topics Concern  . Not on file  Social History Narrative  . Not on file   Social Determinants of Health   Financial Resource Strain:   . Difficulty of Paying Living Expenses:   Food Insecurity:   . Worried About Charity fundraiser in the Last Year:   . Arboriculturist in the Last Year:   Transportation Needs:   . Film/video editor (Medical):   Marland Kitchen Lack of Transportation (Non-Medical):   Physical Activity:   . Days of Exercise per Week:   . Minutes of Exercise per Session:   Stress:   . Feeling of Stress :   Social Connections:   . Frequency of Communication with Friends and Family:   . Frequency of Social Gatherings with Friends and Family:   . Attends Religious Services:   . Active Member of Clubs or Organizations:   . Attends Archivist Meetings:   Marland Kitchen Marital Status:     Allergies:  Allergies  Allergen Reactions  . Levofloxacin   . Penicillins Hives    No anaphylaxis  Did it involve swelling of the face/tongue/throat, SOB, or low BP? No Did it involve sudden or severe rash/hives, skin peeling, or any reaction on the inside of your mouth or nose? Yes Did you need to seek medical attention at a hospital or doctor's office? Yes When did it last happen?Childhood If all above answers are "NO", may proceed with cephalosporin use.   Marland Kitchen Phenergan [Promethazine Hcl]     Tremors, restless lef, akathesia 123XX123    Metabolic Disorder Labs: Lab Results  Component Value Date   HGBA1C 5.0 03/13/2018    No results found for: PROLACTIN Lab Results  Component Value Date   CHOL 108 03/13/2018   TRIG 45 03/13/2018   HDL 35 (L) 03/13/2018   CHOLHDL 3.1 03/13/2018   [place] 64 03/13/2018   Lab Results  Component Value Date   TSH 1.170 03/13/2018    Therapeutic Level Labs: No results found for: LITHIUM No results found for: VALPROATE No components found for:  CBMZ  Current Medications: Current Outpatient Medications  Medication Sig Dispense Refill  . albuterol (VENTOLIN HFA) 108 (90 Base) MCG/ACT inhaler Inhale 1-2 puffs into the lungs every 6 (six) hours as needed for wheezing or shortness of breath. 18 g 0  .  amphetamine-dextroamphetamine (ADDERALL XR) 20 MG 24 hr capsule Take 1 capsule (20 mg total) by mouth every morning. 30 capsule 0  . [START ON 06/09/2019] amphetamine-dextroamphetamine (ADDERALL XR) 30 MG 24 hr capsule Take 1 capsule (30 mg total) by mouth every morning. 30 capsule 0  . [START ON 07/09/2019] amphetamine-dextroamphetamine (ADDERALL XR) 30 MG 24 hr capsule Take 1 capsule (30 mg total) by mouth every morning. 30 capsule 0  . benzonatate (TESSALON) 100 MG capsule Take 1 capsule (100 mg total) by mouth every 8 (eight) hours. (Patient not taking: Reported on 01/15/2019) 21 capsule 0  . cetirizine-pseudoephedrine (ZYRTEC-D) 5-120 MG tablet Take 1 tablet by mouth daily. (Patient not taking: Reported on 01/15/2019) 30 tablet 0  . cyclobenzaprine (FLEXERIL) 10 MG tablet Take 1 tablet (10 mg total) by mouth 2 (two) times daily as needed for muscle spasms. 20 tablet 0  . escitalopram (LEXAPRO) 20 MG tablet Take 1 tablet (20 mg total) by mouth daily. 30 tablet 2  . fluticasone (FLONASE) 50 MCG/ACT nasal spray Place 2 sprays into both nostrils daily. (Patient not taking: Reported on 01/15/2019) 16 g 0  . guaiFENesin (MUCINEX) 600 MG 12 hr tablet Take 1 tablet (600 mg total) by mouth 2 (two) times daily as needed for cough. 30 tablet 0  . ibuprofen (ADVIL) 600 MG tablet Take 1 tablet  (600 mg total) by mouth every 6 (six) hours as needed. 30 tablet 0  . lamoTRIgine (LAMICTAL) 150 MG tablet Take 1 tablet (150 mg total) by mouth 2 (two) times daily. 60 tablet 2  . sulfamethoxazole-trimethoprim (BACTRIM DS) 800-160 MG tablet Take 1 tablet by mouth 2 (two) times daily. 14 tablet 0  . temazepam (RESTORIL) 30 MG capsule Take 1 capsule (30 mg total) by mouth at bedtime as needed for sleep. 30 capsule 2   No current facility-administered medications for this visit.     Psychiatric Specialty Exam: Review of Systems  Psychiatric/Behavioral: Positive for decreased concentration and sleep disturbance.  All other systems reviewed and are negative.   There were no vitals taken for this visit.There is no height or weight on file to calculate BMI.  General Appearance: NA  Eye Contact:  NA  Speech:  Clear and Coherent and Normal Rate  Volume:  Normal  Mood:  Euthymic  Affect:  NA  Thought Process:  Goal Directed  Orientation:  Full (Time, Place, and Person)  Thought Content: Logical   Suicidal Thoughts:  No  Homicidal Thoughts:  No  Memory:  Immediate;   Fair Recent;   Fair Remote;   Fair  Judgement:  Good  Insight:  Fair  Psychomotor Activity:  NA  Concentration:  Concentration: Fair  Recall:  [employer] of Knowledge: Fair  Language: Good  Akathisia:  Negative  Handed:  Right  AIMS (if indicated): not done  Assets:  Communication Skills Desire for Improvement Housing Resilience  ADL's:  Intact  Cognition: WNL  Sleep:  Fair   Screenings: GAD-7     Office Visit from 02/20/2018 in Primary Care at [facility]  Total GAD-7 Score  18    PHQ2-9     Office Visit from 02/20/2018 in [place] at [facility] Visit from 05/06/2017 in [place]  PHQ-2 Total Score  3  2  PHQ-9 Total Score  16  12       Assessment and Plan: 24 yo DWF with bipolar disorder and ADHD (the latter diagnosed when she was 17-34 years old).  Unfortunately  she does not remember medications she has been tried for either of these conditions just says that "there were many". She seems to recall names of Ritalin and Adderall. Available record also shows that she was tried on SSRIs (citalopram, fluoxetine), aripiprazole, oxcarbazepine, hydroxyzine, clonazepam. She describes her mood swings as being very rapid - happening several times during the day. The upswings are described and increased irritability, increased energy, racing thoughts, impulsivity (no euphoria). The longest such an episode can last is less than a day and she denies ever having psychotic symptoms. She also denies having hx of prolonged depressive episodes, suicidal thoughts although she admits OD on meds impulsively out of frustration when she was 13. She did not go to the hospital then and has npo hx of inpatient psychiatric admissions. She tried to self medicate with marijuana but has not used this drug "for a fewyears". She denies abusing other street or prescription drugs. At one time (few years ago) she was also drinking "too much" but she dos not do it any more. Cannabis use disorder is in full remission. WestartedLamictal titration and escitalopram 10 mg daily. Initially she had muscle cramps and some sedation but both have resolved.She also complainedof difficulty with falling asleep - trazodone has not been very effective for that (150 mg)andregularAmbien only helps her fall asleep but she is up after few hours.I have ordered Ambien CRbut it was not approved by her insurance.For the past few weeks she has been taking temazepam and it seems to be more effective (sleeps 5-6 hours on it). We have added Adderall XR 20 mg nd it is helpful for concentration but not yet optimal.  Dx: Bipolar 2 ultra rapid cycling; GAD; ADHD  Plan: We willcontinueescitalopram to20mg  for anxiety, Lamictal 150 mg bid, temazepam 30 mg prn insomnia and increase AdderallXR to 30 mg for concentration  problems.Next appointment in2 months.The plan was discussed with patient who had an opportunity to ask questions and these were all answered. I spend67minutes inphoneconsultation with the patient.    Stephanie Acre, MD 06/07/2019, 10:37 AM

## 2019-06-11 ENCOUNTER — Ambulatory Visit: Payer: Medicaid Other | Admitting: Family Medicine

## 2019-06-15 ENCOUNTER — Ambulatory Visit (HOSPITAL_COMMUNITY): Payer: Medicaid Other | Admitting: Psychiatry

## 2019-06-15 ENCOUNTER — Telehealth: Payer: Self-pay

## 2019-06-15 NOTE — Telephone Encounter (Signed)
Pt contacted the office and stated she is needing a pap. I informed pt that she is not due for a pap till 2023. Pt states she is filing for disability and she is wanting to know if she has cervical cancer so she can it on her paperwork

## 2019-06-18 ENCOUNTER — Telehealth (INDEPENDENT_AMBULATORY_CARE_PROVIDER_SITE_OTHER): Payer: Medicaid Other | Admitting: Psychiatry

## 2019-06-18 ENCOUNTER — Other Ambulatory Visit: Payer: Self-pay

## 2019-06-18 DIAGNOSIS — F902 Attention-deficit hyperactivity disorder, combined type: Secondary | ICD-10-CM | POA: Diagnosis not present

## 2019-06-18 DIAGNOSIS — F3181 Bipolar II disorder: Secondary | ICD-10-CM | POA: Diagnosis not present

## 2019-06-18 DIAGNOSIS — F411 Generalized anxiety disorder: Secondary | ICD-10-CM

## 2019-06-18 MED ORDER — METHYLPHENIDATE HCL ER (OSM) 27 MG PO TBCR
27.0000 mg | EXTENDED_RELEASE_TABLET | Freq: Every day | ORAL | 0 refills | Status: DC
Start: 2019-06-18 — End: 2019-08-07

## 2019-06-18 MED ORDER — METHYLPHENIDATE HCL ER (OSM) 27 MG PO TBCR
27.0000 mg | EXTENDED_RELEASE_TABLET | Freq: Every day | ORAL | 0 refills | Status: DC
Start: 2019-07-18 — End: 2019-08-07

## 2019-06-18 NOTE — Progress Notes (Signed)
[place] MD/PA/NP OP Progress Note  06/18/2019 1:40 PM Kellie Martinez  MRN:  [id number] Interview was conducted by phone and I verified that I was speaking with the correct person using two identifiers. I discussed the limitations of evaluation and management by telemedicine and  the availability of in person appointments. Patient expressed understanding and agreed to proceed.  Chief Complaint: Higher anxiety since Adderall dose was increased.  HPI: 24 yo DWF with bipolar disorder and ADHD (the latter diagnosed when she was 19-44 years old). Unfortunately she does not remember medications she has been tried for either of these conditions just says that "there were many". She seems to recall names of Ritalin and Adderall. Available record also shows that she was tried on SSRIs (citalopram, fluoxetine), aripiprazole, oxcarbazepine, hydroxyzine, clonazepam. She describes her mood swings as being very rapid - happening several times during the day. The upswings are described and increased irritability, increased energy, racing thoughts, impulsivity (no euphoria). The longest such an episode can last is less than a day and she denies ever having psychotic symptoms. She also denies having hx of prolonged depressive episodes, suicidal thoughts although she admits OD on meds impulsively out of frustration when she was 13. She did not go to the hospital then and has npo hx of inpatient psychiatric admissions. She tried to self medicate with marijuana but has not used this drug "for a fewyears". She denies abusing other street or prescription drugs. At one time (few years ago) she was also drinking "too much" but she dos not do it any more. Cannabis use disorder is in full remission. WestartedLamictal titration and escitalopram 10 mg daily. Initially she had muscle cramps and some sedation but both have resolved.She also complainedof difficulty with falling asleep - trazodone has not been very effective for that (150  mg)andregularAmbien only helps her fall asleep but she is up after few hours.I have ordered Ambien CRbut it was not approved by her insurance.For the past few weeks she has been taking temazepam and it seems to be more effective (sleeps 5-6 hours on it). We have added Adderall XR 20 mg and while she found it helpful for concentration is was not yet optimal. We increased dose to 30 mg and she reacted with increased anxiety.   Visit Diagnosis:    ICD-10-CM   1. Attention deficit hyperactivity disorder (ADHD), combined type  F90.2   2. GAD (generalized anxiety disorder)  F41.1   3. Bipolar 2 disorder (HCC)  F31.81     Past Psychiatric History: Please see intake H&P.  Past Medical History:  Past Medical History:  Diagnosis Date  . Anemia   . Anxiety   . Asthma   . Bipolar 1 disorder ([place])   . 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  . Chronic kidney disease   . Depression   . GERD (gastroesophageal reflux disease)   . Molar pregnancy   . Renal calculus or stone    had stent/ lithotripsy in past  . Sleep apnea   . Substance abuse ([place])   . Suicide attempt ([place])    x 2    Past Surgical History:  Procedure Laterality Date  . CYSTOSCOPY     x2 with stone extraction  . EXTRACORPOREAL SHOCK WAVE LITHOTRIPSY Right 06/20/2013   Procedure: EXTRACORPOREAL SHOCK WAVE LITHOTRIPSY (ESWL);  Surgeon: Edwin Dada, MD;  Location: [facility];  Service: Urology;  Laterality: Right;  . FOOT SURGERY Left 2003  . LITHOTRIPSY    . TUBAL LIGATION  2017  .  TUBAL LIGATION      Family Psychiatric History: Reviewed.  Family History:  Family History  Problem Relation Age of Onset  . Depression Mother   . Asthma Mother   . Hypertension Mother   . Mood Disorder Mother   . Heart disease Father   . Kidney disease Father   . Alcoholism Father   . Breast cancer Paternal Aunt   . Breast cancer Maternal Grandmother   . Breast cancer Cousin   . Alcoholism Brother   . Drug abuse Brother     Social History:   Social History   Socioeconomic History  . Marital status: Divorced    Spouse name: Not on file  . Number of children: 3  . Years of education: Not on file  . Highest education level: Not on file  Occupational History  . Not on file  Tobacco Use  . Smoking status: Current Some Day Smoker    Packs/day: 0.25    Types: Cigarettes  . Smokeless tobacco: Never Used  Substance and Sexual Activity  . Alcohol use: Yes    Comment: occ  . Drug use: Not Currently  . Sexual activity: Yes    Birth control/protection: Surgical  Other Topics Concern  . Not on file  Social History Narrative  . Not on file   Social Determinants of Health   Financial Resource Strain:   . Difficulty of Paying Living Expenses:   Food Insecurity:   . Worried About Charity fundraiser in the Last Year:   . Arboriculturist in the Last Year:   Transportation Needs:   . Film/video editor (Medical):   Marland Kitchen Lack of Transportation (Non-Medical):   Physical Activity:   . Days of Exercise per Week:   . Minutes of Exercise per Session:   Stress:   . Feeling of Stress :   Social Connections:   . Frequency of Communication with Friends and Family:   . Frequency of Social Gatherings with Friends and Family:   . Attends Religious Services:   . Active Member of Clubs or Organizations:   . Attends Archivist Meetings:   Marland Kitchen Marital Status:     Allergies:  Allergies  Allergen Reactions  . Levofloxacin   . Penicillins Hives    No anaphylaxis  Did it involve swelling of the face/tongue/throat, SOB, or low BP? No Did it involve sudden or severe rash/hives, skin peeling, or any reaction on the inside of your mouth or nose? Yes Did you need to seek medical attention at a hospital or doctor's office? Yes When did it last happen?Childhood If all above answers are "NO", may proceed with cephalosporin use.   Marland Kitchen Phenergan [Promethazine Hcl]     Tremors, restless lef, akathesia [id number]     Metabolic Disorder Labs: Lab Results  Component Value Date   HGBA1C 5.0 03/13/2018   No results found for: PROLACTIN Lab Results  Component Value Date   CHOL 108 03/13/2018   TRIG 45 03/13/2018   HDL 35 (L) 03/13/2018   CHOLHDL 3.1 03/13/2018   Riley 64 03/13/2018   Lab Results  Component Value Date   TSH 1.170 03/13/2018    Therapeutic Level Labs: No results found for: LITHIUM No results found for: VALPROATE No components found for:  CBMZ  Current Medications: Current Outpatient Medications  Medication Sig Dispense Refill  . albuterol (VENTOLIN HFA) 108 (90 Base) MCG/ACT inhaler Inhale 1-2 puffs into the lungs every 6 (six) hours as needed  for wheezing or shortness of breath. 18 g 0  . amphetamine-dextroamphetamine (ADDERALL XR) 20 MG 24 hr capsule Take 1 capsule (20 mg total) by mouth every morning. 30 capsule 0  . benzonatate (TESSALON) 100 MG capsule Take 1 capsule (100 mg total) by mouth every 8 (eight) hours. (Patient not taking: Reported on 01/15/2019) 21 capsule 0  . cetirizine-pseudoephedrine (ZYRTEC-D) 5-120 MG tablet Take 1 tablet by mouth daily. (Patient not taking: Reported on 01/15/2019) 30 tablet 0  . cyclobenzaprine (FLEXERIL) 10 MG tablet Take 1 tablet (10 mg total) by mouth 2 (two) times daily as needed for muscle spasms. 20 tablet 0  . escitalopram (LEXAPRO) 20 MG tablet Take 1 tablet (20 mg total) by mouth daily. 30 tablet 2  . fluticasone (FLONASE) 50 MCG/ACT nasal spray Place 2 sprays into both nostrils daily. (Patient not taking: Reported on 01/15/2019) 16 g 0  . guaiFENesin (MUCINEX) 600 MG 12 hr tablet Take 1 tablet (600 mg total) by mouth 2 (two) times daily as needed for cough. 30 tablet 0  . ibuprofen (ADVIL) 600 MG tablet Take 1 tablet (600 mg total) by mouth every 6 (six) hours as needed. 30 tablet 0  . lamoTRIgine (LAMICTAL) 150 MG tablet Take 1 tablet (150 mg total) by mouth 2 (two) times daily. 60 tablet 2  . methylphenidate (CONCERTA) 27 MG  PO CR tablet Take 1 tablet (27 mg total) by mouth daily before breakfast. 30 tablet 0  . [START ON 07/18/2019] methylphenidate (CONCERTA) 27 MG PO CR tablet Take 1 tablet (27 mg total) by mouth daily before breakfast. 30 tablet 0  . sulfamethoxazole-trimethoprim (BACTRIM DS) 800-160 MG tablet Take 1 tablet by mouth 2 (two) times daily. 14 tablet 0  . temazepam (RESTORIL) 30 MG capsule Take 1 capsule (30 mg total) by mouth at bedtime as needed for sleep. 30 capsule 2   No current facility-administered medications for this visit.      Psychiatric Specialty Exam: Review of Systems  Psychiatric/Behavioral: Positive for decreased concentration. The patient is nervous/anxious.   All other systems reviewed and are negative.   There were no vitals taken for this visit.There is no height or weight on file to calculate BMI.  General Appearance: NA  Eye Contact:  NA  Speech:  Clear and Coherent and Normal Rate  Volume:  Normal  Mood:  Anxious  Affect:  NA  Thought Process:  Goal Directed  Orientation:  Full (Time, Place, and Person)  Thought Content: Logical   Suicidal Thoughts:  No  Homicidal Thoughts:  No  Memory:  Immediate;   Good Recent;   Good Remote;   Fair  Judgement:  Fair  Insight:  Fair  Psychomotor Activity:  NA  Concentration:  Concentration: Fair  Recall:  Good  Fund of Knowledge: Fair  Language: Good  Akathisia:  Negative  Handed:  Right  AIMS (if indicated): not done  Assets:  Communication Skills Desire for Improvement Housing Resilience  ADL's:  Intact  Cognition: WNL  Sleep:  Good   Screenings: GAD-7     Office Visit from 02/20/2018 in [facility] at [facility]  Total GAD-7 Score  18    PHQ2-9     Office Visit from 02/20/2018 in [place] at [facility] Visit from 05/06/2017 in [place]  PHQ-2 Total Score  3  2  PHQ-9 Total Score  16  12       Assessment and Plan: 24 yo DWF with bipolar disorder  and ADHD (the  latter diagnosed when she was 50-79 years old). Unfortunately she does not remember medications she has been tried for either of these conditions just says that "there were many". She seems to recall names of Ritalin and Adderall. Available record also shows that she was tried on SSRIs (citalopram, fluoxetine), aripiprazole, oxcarbazepine, hydroxyzine, clonazepam. She describes her mood swings as being very rapid - happening several times during the day. The upswings are described and increased irritability, increased energy, racing thoughts, impulsivity (no euphoria). The longest such an episode can last is less than a day and she denies ever having psychotic symptoms. She also denies having hx of prolonged depressive episodes, suicidal thoughts although she admits OD on meds impulsively out of frustration when she was 13. She did not go to the hospital then and has npo hx of inpatient psychiatric admissions. She tried to self medicate with marijuana but has not used this drug "for a fewyears". She denies abusing other street or prescription drugs. At one time (few years ago) she was also drinking "too much" but she dos not do it any more. Cannabis use disorder is in full remission. WestartedLamictal titration and escitalopram 10 mg daily. Initially she had muscle cramps and some sedation but both have resolved.She also complainedof difficulty with falling asleep - trazodone has not been very effective for that (150 mg)andregularAmbien only helps her fall asleep but she is up after few hours.I have ordered Ambien CRbut it was not approved by her insurance.For the past few weeks she has been taking temazepam and it seems to be more effective (sleeps 5-6 hours on it). We have added Adderall XR 20 mg and while she found it helpful for concentration is was not yet optimal. We increased dose to 30 mg and she reacted with increased anxiety.  Dx: Bipolar 2ultra rapid cycling; GAD; ADHD  Plan: We  willcontinueescitalopram to20mg  for anxiety, Lamictal 150 mg bid, temazepam30 mg prn insomnia and change AdderallXR 30 mg to Concerta 27 mg for concentration problems.Next appointment in2 months.The plan was discussed with patient who had an opportunity to ask questions and these were all answered. I spend69minutes inphoneconsultation with the patient.    Stephanie Acre, MD 06/18/2019, 1:40 PM

## 2019-06-21 ENCOUNTER — Encounter: Payer: Self-pay | Admitting: Nurse Practitioner

## 2019-06-23 NOTE — Telephone Encounter (Signed)
She does not require a PAP smear. Negative for cervical cancer.

## 2019-06-26 ENCOUNTER — Other Ambulatory Visit: Payer: Self-pay | Admitting: Nurse Practitioner

## 2019-06-26 DIAGNOSIS — Z131 Encounter for screening for diabetes mellitus: Secondary | ICD-10-CM

## 2019-06-26 DIAGNOSIS — Z130 Encounter for screening for diseases of the blood and blood-forming organs and certain disorders involving the immune mechanism: Secondary | ICD-10-CM

## 2019-06-26 DIAGNOSIS — N760 Acute vaginitis: Secondary | ICD-10-CM

## 2019-06-26 DIAGNOSIS — Z13228 Encounter for screening for other metabolic disorders: Secondary | ICD-10-CM

## 2019-06-26 DIAGNOSIS — Z114 Encounter for screening for human immunodeficiency virus [HIV]: Secondary | ICD-10-CM

## 2019-06-26 NOTE — Telephone Encounter (Signed)
Spoke to patient and informed on PCP advising. Pt. Understood.  

## 2019-06-28 DIAGNOSIS — Z87442 Personal history of urinary calculi: Secondary | ICD-10-CM | POA: Diagnosis not present

## 2019-06-28 DIAGNOSIS — R8271 Bacteriuria: Secondary | ICD-10-CM | POA: Diagnosis not present

## 2019-07-02 ENCOUNTER — Encounter: Payer: Self-pay | Admitting: Nurse Practitioner

## 2019-07-03 ENCOUNTER — Ambulatory Visit: Payer: Self-pay | Admitting: Nurse Practitioner

## 2019-07-04 ENCOUNTER — Other Ambulatory Visit: Payer: Self-pay

## 2019-07-04 ENCOUNTER — Telehealth (HOSPITAL_COMMUNITY): Payer: Self-pay

## 2019-07-04 ENCOUNTER — Encounter: Payer: Self-pay | Admitting: Nurse Practitioner

## 2019-07-04 ENCOUNTER — Ambulatory Visit: Payer: Medicaid Other | Attending: Nurse Practitioner | Admitting: Nurse Practitioner

## 2019-07-04 VITALS — BP 124/76 | HR 80 | Temp 97.7°F | Ht 61.0 in | Wt 192.0 lb

## 2019-07-04 DIAGNOSIS — Z841 Family history of disorders of kidney and ureter: Secondary | ICD-10-CM | POA: Insufficient documentation

## 2019-07-04 DIAGNOSIS — J45909 Unspecified asthma, uncomplicated: Secondary | ICD-10-CM | POA: Diagnosis not present

## 2019-07-04 DIAGNOSIS — Z881 Allergy status to other antibiotic agents status: Secondary | ICD-10-CM | POA: Diagnosis not present

## 2019-07-04 DIAGNOSIS — Z87442 Personal history of urinary calculi: Secondary | ICD-10-CM | POA: Diagnosis not present

## 2019-07-04 DIAGNOSIS — Z130 Encounter for screening for diseases of the blood and blood-forming organs and certain disorders involving the immune mechanism: Secondary | ICD-10-CM | POA: Insufficient documentation

## 2019-07-04 DIAGNOSIS — N189 Chronic kidney disease, unspecified: Secondary | ICD-10-CM | POA: Diagnosis not present

## 2019-07-04 DIAGNOSIS — Z8249 Family history of ischemic heart disease and other diseases of the circulatory system: Secondary | ICD-10-CM | POA: Diagnosis not present

## 2019-07-04 DIAGNOSIS — Z880 Allergy status to penicillin: Secondary | ICD-10-CM | POA: Diagnosis not present

## 2019-07-04 DIAGNOSIS — L981 Factitial dermatitis: Secondary | ICD-10-CM

## 2019-07-04 DIAGNOSIS — F419 Anxiety disorder, unspecified: Secondary | ICD-10-CM | POA: Insufficient documentation

## 2019-07-04 DIAGNOSIS — F319 Bipolar disorder, unspecified: Secondary | ICD-10-CM | POA: Insufficient documentation

## 2019-07-04 DIAGNOSIS — Z114 Encounter for screening for human immunodeficiency virus [HIV]: Secondary | ICD-10-CM

## 2019-07-04 DIAGNOSIS — Z825 Family history of asthma and other chronic lower respiratory diseases: Secondary | ICD-10-CM | POA: Diagnosis not present

## 2019-07-04 DIAGNOSIS — Z131 Encounter for screening for diabetes mellitus: Secondary | ICD-10-CM | POA: Diagnosis not present

## 2019-07-04 DIAGNOSIS — G4730 Sleep apnea, unspecified: Secondary | ICD-10-CM | POA: Insufficient documentation

## 2019-07-04 DIAGNOSIS — Z13228 Encounter for screening for other metabolic disorders: Secondary | ICD-10-CM | POA: Diagnosis not present

## 2019-07-04 DIAGNOSIS — Z888 Allergy status to other drugs, medicaments and biological substances status: Secondary | ICD-10-CM | POA: Insufficient documentation

## 2019-07-04 DIAGNOSIS — Z79899 Other long term (current) drug therapy: Secondary | ICD-10-CM | POA: Insufficient documentation

## 2019-07-04 DIAGNOSIS — Z803 Family history of malignant neoplasm of breast: Secondary | ICD-10-CM | POA: Diagnosis not present

## 2019-07-04 MED ORDER — HYDROXYZINE HCL 10 MG PO TABS: 10.0000 mg | ORAL_TABLET | Freq: Three times a day (TID) | ORAL | 2 refills | Status: AC | PRN

## 2019-07-04 NOTE — Telephone Encounter (Signed)
Ok.I will route this message to Dr Mamie Nick.

## 2019-07-04 NOTE — Telephone Encounter (Signed)
This is Dr. Audria Nine patient. SPOKE WITH PATTY FROM [facility]. SHE STATED THAT   CONCERTA   IS PREFERRED AND DOES NOT REQUIRE A PRIOR AUTHORIZATION.  REFERENCE# [id number]

## 2019-07-04 NOTE — Progress Notes (Signed)
Assessment & Plan:  Kellie Martinez was seen today for knot on her scalp.  Diagnoses and all orders for this visit:  Excoriation, neurotic -     hydrOXYzine (ATARAX/VISTARIL) 10 MG tablet; Take 1 tablet (10 mg total) by mouth 3 (three) times daily as needed.  Encounter for screening for HIV -     HIV antibody (with reflex)  Screening for metabolic disorder -     [id number]  Screening for deficiency anemia -     CBC  Encounter for screening for diabetes mellitus -     Hemoglobin A1c    Patient has been counseled on age-appropriate routine health concerns for screening and prevention. These are reviewed and up-to-date. Referrals have been placed accordingly. Immunizations are up-to-date or declined.    Subjective:   Chief Complaint  Patient presents with  . knot on her scalp    Pt. have couple of knots on top of her head on her scalp, per patient sensitive with pain.    HPI Kellie Martinez 24 y.o. female presents to office today for follow up.   She has concerns of "knots" in her scalp. Also endorses incessant need to scratch her scalp and other body parts as well. She has a history of Anxiety,  Bipolar 1 disorder, Sleep apnea, Substance abuse ([place]), and Suicide attempt ([place]). I was unable to palpate any bony deformities or palpable masses in the scalp today. I was able to visualize several areas of excoriation with eschar present. No signs of skin infection.    Review of Systems  Constitutional: Negative for fever, malaise/fatigue and weight loss.  Respiratory: Negative.  Negative for cough and shortness of breath.   Cardiovascular: Negative.  Negative for chest pain, palpitations and leg swelling.  Musculoskeletal: Negative.  Negative for myalgias.  Skin:       SEE HPI  Neurological: Negative.  Negative for dizziness, focal weakness, seizures and headaches.  Psychiatric/Behavioral: Positive for substance abuse. Negative for suicidal ideas. The patient is nervous/anxious.      Past Medical History:  Diagnosis Date  . Anemia   . Anxiety   . Asthma   . Bipolar 1 disorder ([place])   . 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  . Chronic kidney disease   . Depression   . GERD (gastroesophageal reflux disease)   . Molar pregnancy   . Renal calculus or stone    had stent/ lithotripsy in past  . Sleep apnea   . Substance abuse ([place])   . Suicide attempt ([place])    x 2    Past Surgical History:  Procedure Laterality Date  . CYSTOSCOPY     x2 with stone extraction  . EXTRACORPOREAL SHOCK WAVE LITHOTRIPSY Right 06/20/2013   Procedure: EXTRACORPOREAL SHOCK WAVE LITHOTRIPSY (ESWL);  Surgeon: Edwin Dada, MD;  Location: [facility];  Service: Urology;  Laterality: Right;  . FOOT SURGERY Left 2003  . LITHOTRIPSY    . TUBAL LIGATION  2017  . TUBAL LIGATION      Family History  Problem Relation Age of Onset  . Depression Mother   . Asthma Mother   . Hypertension Mother   . Mood Disorder Mother   . Heart disease Father   . Kidney disease Father   . Alcoholism Father   . Breast cancer Paternal Aunt   . Breast cancer Maternal Grandmother   . Breast cancer Cousin   . Alcoholism Brother   . Drug abuse Brother     Social History Reviewed with no changes  to be made today.   Outpatient Medications Prior to Visit  Medication Sig Dispense Refill  . albuterol (VENTOLIN HFA) 108 (90 Base) MCG/ACT inhaler Inhale 1-2 puffs into the lungs every 6 (six) hours as needed for wheezing or shortness of breath. 18 g 0  . escitalopram (LEXAPRO) 20 MG tablet Take 1 tablet (20 mg total) by mouth daily. 30 tablet 2  . ibuprofen (ADVIL) 600 MG tablet Take 1 tablet (600 mg total) by mouth every 6 (six) hours as needed. 30 tablet 0  . lamoTRIgine (LAMICTAL) 150 MG tablet Take 1 tablet (150 mg total) by mouth 2 (two) times daily. 60 tablet 2  . methylphenidate (CONCERTA) 27 MG PO CR tablet Take 1 tablet (27 mg total) by mouth daily before breakfast. 30 tablet 0  . [START ON 07/18/2019] methylphenidate  (CONCERTA) 27 MG PO CR tablet Take 1 tablet (27 mg total) by mouth daily before breakfast. 30 tablet 0  . temazepam (RESTORIL) 30 MG capsule Take 1 capsule (30 mg total) by mouth at bedtime as needed for sleep. 30 capsule 2  . cyclobenzaprine (FLEXERIL) 10 MG tablet Take 1 tablet (10 mg total) by mouth 2 (two) times daily as needed for muscle spasms. 20 tablet 0  . amphetamine-dextroamphetamine (ADDERALL XR) 20 MG 24 hr capsule Take 1 capsule (20 mg total) by mouth every morning. 30 capsule 0  . benzonatate (TESSALON) 100 MG capsule Take 1 capsule (100 mg total) by mouth every 8 (eight) hours. (Patient not taking: Reported on 01/15/2019) 21 capsule 0  . cetirizine-pseudoephedrine (ZYRTEC-D) 5-120 MG tablet Take 1 tablet by mouth daily. (Patient not taking: Reported on 01/15/2019) 30 tablet 0  . fluticasone (FLONASE) 50 MCG/ACT nasal spray Place 2 sprays into both nostrils daily. (Patient not taking: Reported on 01/15/2019) 16 g 0  . guaiFENesin (MUCINEX) 600 MG 12 hr tablet Take 1 tablet (600 mg total) by mouth 2 (two) times daily as needed for cough. (Patient not taking: Reported on 07/04/2019) 30 tablet 0  . sulfamethoxazole-trimethoprim (BACTRIM DS) 800-160 MG tablet Take 1 tablet by mouth 2 (two) times daily. 14 tablet 0   No facility-administered medications prior to visit.    Allergies  Allergen Reactions  . Levofloxacin   . Penicillins Hives    No anaphylaxis  Did it involve swelling of the face/tongue/throat, SOB, or low BP? No Did it involve sudden or severe rash/hives, skin peeling, or any reaction on the inside of your mouth or nose? Yes Did you need to seek medical attention at a hospital or doctor's office? Yes When did it last happen?Childhood If all above answers are "NO", may proceed with cephalosporin use.   Marland Kitchen Phenergan [Promethazine Hcl]     Tremors, restless lef, akathesia 01-26-2018       Objective:    BP 124/76 (BP Location: Left Arm, Patient Position: Sitting,  Cuff Size: Normal)   Pulse 80   Temp 97.7 F (36.5 C) (Temporal)   Ht '5\' 1"'$  (1.549 m)   Wt 192 lb (87.1 kg)   LMP 06/30/2019   SpO2 99%   BMI 36.28 kg/m  Wt Readings from Last 3 Encounters:  07/04/19 192 lb (87.1 kg)  02/03/19 188 lb (85.3 kg)  01/15/19 188 lb (85.3 kg)    Physical Exam Vitals and nursing note reviewed.  Constitutional:      Appearance: She is well-developed.  HENT:     Head: Normocephalic and atraumatic.      Comments: Several macular areas of excoriation  with eschar present Cardiovascular:     Rate and Rhythm: Normal rate and regular rhythm.     Heart sounds: Normal heart sounds. No murmur. No friction rub. No gallop.   Pulmonary:     Effort: Pulmonary effort is normal. No tachypnea or respiratory distress.     Breath sounds: Normal breath sounds. No decreased breath sounds, wheezing, rhonchi or rales.  Chest:     Chest wall: No tenderness.  Abdominal:     General: Bowel sounds are normal.     Palpations: Abdomen is soft.  Musculoskeletal:        General: Normal range of motion.     Cervical back: Normal range of motion.  Skin:    General: Skin is warm and dry.  Neurological:     Mental Status: She is alert and oriented to person, place, and time.     Coordination: Coordination normal.  Psychiatric:        Behavior: Behavior normal. Behavior is cooperative.        Thought Content: Thought content normal.        Judgment: Judgment normal.          Patient has been counseled extensively about nutrition and exercise as well as the importance of adherence with medications and regular follow-up. The patient was given clear instructions to go to ER or return to medical center if symptoms don't improve, worsen or new problems develop. The patient verbalized understanding.   Follow-up: Return if symptoms worsen or fail to improve.   Gildardo Pounds, FNP-BC [facility] [place], [place]   07/05/2019,  9:21 AM

## 2019-07-05 ENCOUNTER — Encounter: Payer: Self-pay | Admitting: Nurse Practitioner

## 2019-07-05 DIAGNOSIS — M542 Cervicalgia: Secondary | ICD-10-CM | POA: Diagnosis not present

## 2019-07-05 DIAGNOSIS — M5022 Other cervical disc displacement, mid-cervical region, unspecified level: Secondary | ICD-10-CM | POA: Diagnosis not present

## 2019-07-05 DIAGNOSIS — M5412 Radiculopathy, cervical region: Secondary | ICD-10-CM | POA: Diagnosis not present

## 2019-07-05 LAB — CMP14+EGFR
ALT: 12 IU/L (ref 0–32)
AST: 13 IU/L (ref 0–40)
Albumin/Globulin Ratio: 2 (ref 1.2–2.2)
Albumin: 4.2 g/dL (ref 3.9–5.0)
Alkaline Phosphatase: 59 IU/L (ref 48–121)
BUN/Creatinine Ratio: 19 (ref 9–23)
BUN: 13 mg/dL (ref 6–20)
Bilirubin Total: <0.2 mg/dL (ref 0.0–1.2)
CO2: 22 mmol/L (ref 20–29)
Calcium: 9.2 mg/dL (ref 8.7–10.2)
Chloride: 106 mmol/L (ref 96–106)
Creatinine, Ser: 0.7 mg/dL (ref 0.57–1.00)
GFR calc Af Amer: 139 mL/min/{1.73_m2} (ref >59)
GFR calc non Af Amer: 121 mL/min/{1.73_m2} (ref >59)
Globulin, Total: 2.1 g/dL (ref 1.5–4.5)
Glucose: 73 mg/dL (ref 65–99)
Potassium: 4.6 mmol/L (ref 3.5–5.2)
Sodium: 140 mmol/L (ref 134–144)
Total Protein: 6.3 g/dL (ref 6.0–8.5)

## 2019-07-05 LAB — HIV ANTIBODY (ROUTINE TESTING W REFLEX): HIV Screen 4th Generation wRfx: NONREACTIVE

## 2019-07-05 LAB — CBC
Hematocrit: 35.7 % (ref 34.0–46.6)
Hemoglobin: 12.2 g/dL (ref 11.1–15.9)
MCH: 31 pg (ref 26.6–33.0)
MCHC: 34.2 g/dL (ref 31.5–35.7)
MCV: 91 fL (ref 79–97)
Platelets: 181 10*3/uL (ref 150–450)
RBC: 3.94 x10E6/uL (ref 3.77–5.28)
RDW: 12.8 % (ref 11.7–15.4)
WBC: 5.9 10*3/uL (ref 3.4–10.8)

## 2019-07-05 LAB — HEMOGLOBIN A1C
Est. average glucose Bld gHb Est-mCnc: 100 mg/dL
Hgb A1c MFr Bld: 5.1 % (ref 4.8–5.6)

## 2019-07-06 NOTE — Telephone Encounter (Signed)
Pt. Was scheduled an appt. For hearing test for a nurse visit. Pt. Was informed on date and time.

## 2019-07-13 ENCOUNTER — Ambulatory Visit: Payer: Medicaid Other

## 2019-07-13 DIAGNOSIS — M5022 Other cervical disc displacement, mid-cervical region, unspecified level: Secondary | ICD-10-CM | POA: Diagnosis not present

## 2019-07-13 DIAGNOSIS — M5412 Radiculopathy, cervical region: Secondary | ICD-10-CM | POA: Diagnosis not present

## 2019-07-13 DIAGNOSIS — M542 Cervicalgia: Secondary | ICD-10-CM | POA: Diagnosis not present

## 2019-07-20 ENCOUNTER — Ambulatory Visit: Payer: Medicaid Other

## 2019-07-24 ENCOUNTER — Encounter: Payer: Self-pay | Admitting: Nurse Practitioner

## 2019-07-25 ENCOUNTER — Encounter: Payer: Self-pay | Admitting: Nurse Practitioner

## 2019-07-25 DIAGNOSIS — Z23 Encounter for immunization: Secondary | ICD-10-CM | POA: Diagnosis not present

## 2019-07-26 ENCOUNTER — Other Ambulatory Visit: Payer: Self-pay | Admitting: Nurse Practitioner

## 2019-07-26 DIAGNOSIS — Z0000 Encounter for general adult medical examination without abnormal findings: Secondary | ICD-10-CM

## 2019-07-28 DIAGNOSIS — S199XXA Unspecified injury of neck, initial encounter: Secondary | ICD-10-CM | POA: Diagnosis not present

## 2019-07-28 DIAGNOSIS — Z87891 Personal history of nicotine dependence: Secondary | ICD-10-CM | POA: Diagnosis not present

## 2019-07-28 DIAGNOSIS — M5020 Other cervical disc displacement, unspecified cervical region: Secondary | ICD-10-CM | POA: Diagnosis not present

## 2019-07-28 DIAGNOSIS — M542 Cervicalgia: Secondary | ICD-10-CM | POA: Diagnosis not present

## 2019-07-30 ENCOUNTER — Other Ambulatory Visit (HOSPITAL_COMMUNITY): Payer: Self-pay | Admitting: Psychiatry

## 2019-08-02 DIAGNOSIS — M5022 Other cervical disc displacement, mid-cervical region, unspecified level: Secondary | ICD-10-CM | POA: Diagnosis not present

## 2019-08-02 DIAGNOSIS — M542 Cervicalgia: Secondary | ICD-10-CM | POA: Diagnosis not present

## 2019-08-02 DIAGNOSIS — M5412 Radiculopathy, cervical region: Secondary | ICD-10-CM | POA: Diagnosis not present

## 2019-08-03 ENCOUNTER — Other Ambulatory Visit (HOSPITAL_COMMUNITY)
Admission: RE | Admit: 2019-08-03 | Discharge: 2019-08-03 | Disposition: A | Discharge status: Home / Self Care | Payer: Medicaid Other | Source: Ambulatory Visit | Attending: Nurse Practitioner | Admitting: Nurse Practitioner

## 2019-08-03 ENCOUNTER — Other Ambulatory Visit: Payer: Self-pay

## 2019-08-03 ENCOUNTER — Ambulatory Visit: Payer: Medicaid Other

## 2019-08-03 ENCOUNTER — Encounter: Payer: Self-pay | Admitting: Nurse Practitioner

## 2019-08-03 ENCOUNTER — Ambulatory Visit: Payer: Medicaid Other | Attending: Nurse Practitioner | Admitting: Nurse Practitioner

## 2019-08-03 VITALS — BP 108/69 | HR 89 | Temp 97.7°F | Ht 61.0 in | Wt 198.0 lb

## 2019-08-03 DIAGNOSIS — G4730 Sleep apnea, unspecified: Secondary | ICD-10-CM | POA: Diagnosis not present

## 2019-08-03 DIAGNOSIS — Z01419 Encounter for gynecological examination (general) (routine) without abnormal findings: Secondary | ICD-10-CM | POA: Diagnosis not present

## 2019-08-03 DIAGNOSIS — Z8249 Family history of ischemic heart disease and other diseases of the circulatory system: Secondary | ICD-10-CM | POA: Diagnosis not present

## 2019-08-03 DIAGNOSIS — F319 Bipolar disorder, unspecified: Secondary | ICD-10-CM | POA: Insufficient documentation

## 2019-08-03 DIAGNOSIS — F419 Anxiety disorder, unspecified: Secondary | ICD-10-CM | POA: Diagnosis not present

## 2019-08-03 DIAGNOSIS — J45909 Unspecified asthma, uncomplicated: Secondary | ICD-10-CM | POA: Insufficient documentation

## 2019-08-03 DIAGNOSIS — Z880 Allergy status to penicillin: Secondary | ICD-10-CM | POA: Insufficient documentation

## 2019-08-03 DIAGNOSIS — Z124 Encounter for screening for malignant neoplasm of cervix: Secondary | ICD-10-CM

## 2019-08-03 DIAGNOSIS — Z79899 Other long term (current) drug therapy: Secondary | ICD-10-CM | POA: Diagnosis not present

## 2019-08-03 DIAGNOSIS — Z888 Allergy status to other drugs, medicaments and biological substances status: Secondary | ICD-10-CM | POA: Diagnosis not present

## 2019-08-03 DIAGNOSIS — Z87442 Personal history of urinary calculi: Secondary | ICD-10-CM | POA: Diagnosis not present

## 2019-08-03 DIAGNOSIS — Z841 Family history of disorders of kidney and ureter: Secondary | ICD-10-CM | POA: Diagnosis not present

## 2019-08-03 DIAGNOSIS — Z881 Allergy status to other antibiotic agents status: Secondary | ICD-10-CM | POA: Insufficient documentation

## 2019-08-03 DIAGNOSIS — Z803 Family history of malignant neoplasm of breast: Secondary | ICD-10-CM | POA: Insufficient documentation

## 2019-08-03 NOTE — Progress Notes (Signed)
Assessment & Plan:  Alashia was seen today for gynecologic exam.  Diagnoses and all orders for this visit:  Encounter for Papanicolaou smear for cervical cancer screening -     Cervicovaginal ancillary only -     Cytology - PAP    Patient has been counseled on age-appropriate routine health concerns for screening and prevention. These are reviewed and up-to-date. Referrals have been placed accordingly. Immunizations are up-to-date or declined.    Subjective:   Chief Complaint  Patient presents with  . Gynecologic Exam    Pt. is here for pap smear.    HPI [name] 24 y.o. female presents to office today for PAP smear.   Review of Systems  Constitutional: Negative.  Negative for chills, fever, malaise/fatigue and weight loss.  Respiratory: Negative.  Negative for cough, shortness of breath and wheezing.   Cardiovascular: Negative.  Negative for chest pain, orthopnea and leg swelling.  Gastrointestinal: Negative for abdominal pain.  Genitourinary: Negative for flank pain.       Malodorous vaginal discharge  Skin: Negative.  Negative for rash.  Psychiatric/Behavioral: Negative for suicidal ideas.    Past Medical History:  Diagnosis Date  . Anemia   . Anxiety   . Asthma   . Bipolar 1 disorder ([place])   . 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  . Chronic kidney disease   . Depression   . GERD (gastroesophageal reflux disease)   . Molar pregnancy   . Renal calculus or stone    had stent/ lithotripsy in past  . Sleep apnea   . Substance abuse ([place])   . Suicide attempt ([place])    x 2    Past Surgical History:  Procedure Laterality Date  . CYSTOSCOPY     x2 with stone extraction  . EXTRACORPOREAL SHOCK WAVE LITHOTRIPSY Right 06/20/2013   Procedure: EXTRACORPOREAL SHOCK WAVE LITHOTRIPSY (ESWL);  Surgeon: Edwin Dada, MD;  Location: [facility];  Service: Urology;  Laterality: Right;  . FOOT SURGERY Left 2003  . LITHOTRIPSY    . TUBAL LIGATION  2017  . TUBAL LIGATION      Family  History  Problem Relation Age of Onset  . Depression Mother   . Asthma Mother   . Hypertension Mother   . Mood Disorder Mother   . Heart disease Father   . Kidney disease Father   . Alcoholism Father   . Breast cancer Paternal Aunt   . Breast cancer Maternal Grandmother   . Breast cancer Cousin   . Alcoholism Brother   . Drug abuse Brother     Social History Reviewed with no changes to be made today.   Outpatient Medications Prior to Visit  Medication Sig Dispense Refill  . albuterol (VENTOLIN HFA) 108 (90 Base) MCG/ACT inhaler Inhale 1-2 puffs into the lungs every 6 (six) hours as needed for wheezing or shortness of breath. 18 g 0  . escitalopram (LEXAPRO) 20 MG tablet TAKE 1 TABLET BY MOUTH EVERY DAY 30 tablet 2  . hydrOXYzine (ATARAX/VISTARIL) 10 MG tablet Take 1 tablet (10 mg total) by mouth 3 (three) times daily as needed. 60 tablet 2  . ibuprofen (ADVIL) 600 MG tablet Take 1 tablet (600 mg total) by mouth every 6 (six) hours as needed. 30 tablet 0  . lamoTRIgine (LAMICTAL) 150 MG tablet Take 1 tablet (150 mg total) by mouth 2 (two) times daily. 60 tablet 2  . methylphenidate (CONCERTA) 27 MG PO CR tablet Take 1 tablet (27 mg total) by mouth  daily before breakfast. 30 tablet 0  . temazepam (RESTORIL) 30 MG capsule TAKE 1 CAPSULE BY MOUTH AT BEDTIME 30 capsule 2  . methylphenidate (CONCERTA) 27 MG PO CR tablet Take 1 tablet (27 mg total) by mouth daily before breakfast. 30 tablet 0   No facility-administered medications prior to visit.    Allergies  Allergen Reactions  . Levofloxacin   . Penicillins Hives    No anaphylaxis  Did it involve swelling of the face/tongue/throat, SOB, or low BP? No Did it involve sudden or severe rash/hives, skin peeling, or any reaction on the inside of your mouth or nose? Yes Did you need to seek medical attention at a hospital or doctor's office? Yes When did it last happen?Childhood If all above answers are "NO", may proceed with  cephalosporin use.   Marland Kitchen Phenergan [Promethazine Hcl]     Tremors, restless lef, akathesia 01-26-2018       Objective:    BP 108/69 (BP Location: Left Arm, Patient Position: Sitting, Cuff Size: Normal)   Pulse 89   Temp 97.7 F (36.5 C) (Temporal)   Ht 5\' 1"  (1.549 m)   Wt 198 lb (89.8 kg)   LMP 06/25/2019   SpO2 99%   BMI 37.41 kg/m  Wt Readings from Last 3 Encounters:  08/03/19 198 lb (89.8 kg)  07/04/19 192 lb (87.1 kg)  02/03/19 188 lb (85.3 kg)    Physical Exam Exam conducted with a chaperone present.  Constitutional:      Appearance: She is well-developed.  HENT:     Head: Normocephalic.  Cardiovascular:     Rate and Rhythm: Normal rate and regular rhythm.     Heart sounds: Normal heart sounds.  Pulmonary:     Effort: Pulmonary effort is normal.     Breath sounds: Normal breath sounds.  Abdominal:     General: Bowel sounds are normal.     Palpations: Abdomen is soft.     Hernia: There is no hernia in the left inguinal area.  Genitourinary:    Exam position: Lithotomy position.     Labia:        Right: No rash, tenderness, lesion or injury.        Left: No rash, tenderness, lesion or injury.      Vagina: Normal. No signs of injury and foreign body. No vaginal discharge, erythema, tenderness or bleeding.     Cervix: No cervical motion tenderness or friability.     Uterus: Not deviated and not enlarged.      Adnexa:        Right: No mass, tenderness or fullness.         Left: No mass, tenderness or fullness.       Rectum: Normal. No external hemorrhoid.  Lymphadenopathy:     Lower Body: No right inguinal adenopathy. No left inguinal adenopathy.  Skin:    General: Skin is warm and dry.  Neurological:     Mental Status: She is alert and oriented to person, place, and time.  Psychiatric:        Behavior: Behavior normal.        Thought Content: Thought content normal.        Judgment: Judgment normal.          Patient has been counseled extensively  about nutrition and exercise as well as the importance of adherence with medications and regular follow-up. The patient was given clear instructions to go to ER or return to medical center if  symptoms don't improve, worsen or new problems develop. The patient verbalized understanding.   Follow-up: Return if symptoms worsen or fail to improve.   Gildardo Pounds, FNP-BC [facility] [place], [place]   08/03/2019, 8:00 PM

## 2019-08-04 DIAGNOSIS — R87610 Atypical squamous cells of undetermined significance on cytologic smear of cervix (ASC-US): Secondary | ICD-10-CM | POA: Diagnosis not present

## 2019-08-06 LAB — CERVICOVAGINAL ANCILLARY ONLY
Bacterial Vaginitis (gardnerella): NEGATIVE
Candida Glabrata: NEGATIVE
Candida Vaginitis: POSITIVE — AB
Chlamydia: NEGATIVE
Comment: NEGATIVE
Comment: NEGATIVE
Comment: NEGATIVE
Comment: NEGATIVE
Comment: NEGATIVE
Comment: NORMAL
Neisseria Gonorrhea: NEGATIVE
Trichomonas: NEGATIVE

## 2019-08-07 ENCOUNTER — Other Ambulatory Visit: Payer: Self-pay

## 2019-08-07 ENCOUNTER — Other Ambulatory Visit (HOSPITAL_COMMUNITY): Payer: Self-pay | Admitting: Psychiatry

## 2019-08-07 ENCOUNTER — Telehealth (INDEPENDENT_AMBULATORY_CARE_PROVIDER_SITE_OTHER): Payer: Medicaid Other | Admitting: Psychiatry

## 2019-08-07 DIAGNOSIS — F3181 Bipolar II disorder: Secondary | ICD-10-CM | POA: Diagnosis not present

## 2019-08-07 DIAGNOSIS — F411 Generalized anxiety disorder: Secondary | ICD-10-CM

## 2019-08-07 DIAGNOSIS — F902 Attention-deficit hyperactivity disorder, combined type: Secondary | ICD-10-CM

## 2019-08-07 LAB — CYTOLOGY - PAP
Comment: NEGATIVE
Diagnosis: UNDETERMINED — AB
High risk HPV: NEGATIVE

## 2019-08-07 MED ORDER — AMPHETAMINE-DEXTROAMPHET ER 20 MG PO CP24: 20.0000 mg | ORAL_CAPSULE | Freq: Every day | ORAL | 0 refills | Status: DC

## 2019-08-07 MED ORDER — LAMOTRIGINE 150 MG PO TABS: 150.0000 mg | ORAL_TABLET | Freq: Two times a day (BID) | ORAL | 2 refills | Status: DC

## 2019-08-07 NOTE — Progress Notes (Signed)
[place] MD/PA/NP OP Progress Note  08/07/2019 10:59 AM Kellie Martinez  MRN:  [id number] Interview was conducted by phone and I verified that I was speaking with the correct person using two identifiers. I discussed the limitations of evaluation and management by telemedicine and  the availability of in person appointments. Patient expressed understanding and agreed to proceed. Patient location - home; physician - home office.  Chief Complaint: Mood swings, lack of focus.  HPI: 24 yo DWFwithbipolar disorder and ADHD(the latter diagnosedwhen she was 2-68 years old). Unfortunately she does not remember medications she has been tried for either of these conditions just says that "there were many". She seems to recall names of Ritalin and Adderall. Available record also shows that she was tried on SSRIs (citalopram, fluoxetine), aripiprazole, oxcarbazepine, hydroxyzine, clonazepam. She describes her mood swings as being very rapid - happening several times during the day. The upswings are described and increased irritability, increased energy, racing thoughts, impulsivity (no euphoria). The longest such an episode can last is less than a day and she denies ever having psychotic symptoms. She also denies having hx of prolonged depressive episodes, suicidal thoughts although she admits OD on meds impulsively out of frustration when she was 13. She did not go to the hospital then and has npo hx of inpatient psychiatric admissions. She tried to self medicate with marijuana but has not used this drug "for a fewyears". She denies abusing other street or prescription drugs. At one time (few years ago) she was also drinking "too much" but she dos not do it any more. Cannabis use disorder is in full remission. WestartedLamictal titration and escitalopram 10 mg daily. Initially she had muscle cramps and some sedation but both have resolved.She also complainedof difficulty with falling asleep - trazodone has not been  very effective for that (150 mg)andregularAmbien only helps her fall asleep but she is up after few hours.I have ordered Ambien CRbut it was not approved by her insurance.For the past few weeks she has been taking temazepam and it seems to be more effective (sleeps 5-6 hours on it).We have added Adderall XR 20 mg and while she found it helpful for concentration is was not yet optimal. We increased dose to 30 mg and she reacted with increased anxiety. The same happened when we tried Concerta 27 mg. She also run out of Lamictal (unclear why as she should have had a refill in pharmacy) and  She has been much more labile now.  Visit Diagnosis:    ICD-10-CM   1. Bipolar 2 disorder (HCC)  F31.81   2. GAD (generalized anxiety disorder)  F41.1   3. Attention deficit hyperactivity disorder (ADHD), combined type  F90.2     Past Psychiatric History: Please see intake H&P.  Past Medical History:  Past Medical History:  Diagnosis Date  . Anemia   . Anxiety   . Asthma   . Bipolar 1 disorder ([place])   . 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  . Chronic kidney disease   . Depression   . GERD (gastroesophageal reflux disease)   . Molar pregnancy   . Renal calculus or stone    had stent/ lithotripsy in past  . Sleep apnea   . Substance abuse ([place])   . Suicide attempt ([place])    x 2    Past Surgical History:  Procedure Laterality Date  . CYSTOSCOPY     x2 with stone extraction  . EXTRACORPOREAL SHOCK WAVE LITHOTRIPSY Right 06/20/2013   Procedure: EXTRACORPOREAL SHOCK WAVE  LITHOTRIPSY (ESWL);  Surgeon: Edwin Dada, MD;  Location: [facility];  Service: Urology;  Laterality: Right;  . FOOT SURGERY Left 2003  . LITHOTRIPSY    . TUBAL LIGATION  2017  . TUBAL LIGATION      Family Psychiatric History: Reviewed.  Family History:  Family History  Problem Relation Age of Onset  . Depression Mother   . Asthma Mother   . Hypertension Mother   . Mood Disorder Mother   . Heart disease Father   . Kidney disease  Father   . Alcoholism Father   . Breast cancer Paternal Aunt   . Breast cancer Maternal Grandmother   . Breast cancer Cousin   . Alcoholism Brother   . Drug abuse Brother     Social History:  Social History   Socioeconomic History  . Marital status: Significant Other    Spouse name: Not on file  . Number of children: 3  . Years of education: Not on file  . Highest education level: Not on file  Occupational History  . Not on file  Tobacco Use  . Smoking status: Former Smoker    Packs/day: 0.25    Types: Cigarettes  . Smokeless tobacco: Never Used  Vaping Use  . Vaping Use: Some days  Substance and Sexual Activity  . Alcohol use: Not Currently    Comment: stopped drinking for 3 months.   . Drug use: Not Currently  . Sexual activity: Yes    Birth control/protection: Surgical  Other Topics Concern  . Not on file  Social History Narrative  . Not on file   Social Determinants of Health   Financial Resource Strain:   . Difficulty of Paying Living Expenses:   Food Insecurity:   . Worried About Charity fundraiser in the Last Year:   . Arboriculturist in the Last Year:   Transportation Needs:   . Film/video editor (Medical):   Marland Kitchen Lack of Transportation (Non-Medical):   Physical Activity:   . Days of Exercise per Week:   . Minutes of Exercise per Session:   Stress:   . Feeling of Stress :   Social Connections:   . Frequency of Communication with Friends and Family:   . Frequency of Social Gatherings with Friends and Family:   . Attends Religious Services:   . Active Member of Clubs or Organizations:   . Attends Archivist Meetings:   Marland Kitchen Marital Status:     Allergies:  Allergies  Allergen Reactions  . Levofloxacin   . Penicillins Hives    No anaphylaxis  Did it involve swelling of the face/tongue/throat, SOB, or low BP? No Did it involve sudden or severe rash/hives, skin peeling, or any reaction on the inside of your mouth or nose? Yes Did you  need to seek medical attention at a hospital or doctor's office? Yes When did it last happen?Childhood If all above answers are "NO", may proceed with cephalosporin use.   Marland Kitchen Phenergan [Promethazine Hcl]     Tremors, restless lef, akathesia [id number]    Metabolic Disorder Labs: Lab Results  Component Value Date   HGBA1C 5.1 07/04/2019   No results found for: PROLACTIN Lab Results  Component Value Date   CHOL 108 03/13/2018   TRIG 45 03/13/2018   HDL 35 (L) 03/13/2018   CHOLHDL 3.1 03/13/2018   LDLCALC 64 03/13/2018   Lab Results  Component Value Date   TSH 1.170 03/13/2018    Therapeutic Level  Labs: No results found for: LITHIUM No results found for: VALPROATE No components found for:  CBMZ  Current Medications: Current Outpatient Medications  Medication Sig Dispense Refill  . albuterol (VENTOLIN HFA) 108 (90 Base) MCG/ACT inhaler Inhale 1-2 puffs into the lungs every 6 (six) hours as needed for wheezing or shortness of breath. 18 g 0  . amphetamine-dextroamphetamine (ADDERALL XR) 20 MG 24 hr capsule Take 1 capsule (20 mg total) by mouth daily. 30 capsule 0  . escitalopram (LEXAPRO) 20 MG tablet TAKE 1 TABLET BY MOUTH EVERY DAY 30 tablet 2  . ibuprofen (ADVIL) 600 MG tablet Take 1 tablet (600 mg total) by mouth every 6 (six) hours as needed. 30 tablet 0  . lamoTRIgine (LAMICTAL) 150 MG tablet Take 1 tablet (150 mg total) by mouth 2 (two) times daily. 60 tablet 2  . temazepam (RESTORIL) 30 MG capsule TAKE 1 CAPSULE BY MOUTH AT BEDTIME 30 capsule 2   No current facility-administered medications for this visit.    Psychiatric Specialty Exam: Review of Systems  Psychiatric/Behavioral: Positive for decreased concentration and dysphoric mood. The patient is nervous/anxious.   All other systems reviewed and are negative.   There were no vitals taken for this visit.There is no height or weight on file to calculate BMI.  General Appearance: NA  Eye Contact:  NA   Speech:  Clear and Coherent and Pressured  Volume:  Normal  Mood:  Dysphoric  Affect:  NA  Thought Process:  Descriptions of Associations: Circumstantial  Orientation:  Full (Time, Place, and Person)  Thought Content: Logical   Suicidal Thoughts:  No  Homicidal Thoughts:  No  Memory:  Immediate;   Fair Recent;   Fair Remote;   Good  Judgement:  Fair  Insight:  Fair  Psychomotor Activity:  NA  Concentration:  Concentration: Fair  Recall:  Good  Fund of Knowledge: Good  Language: Good  Akathisia:  Negative  Handed:  Right  AIMS (if indicated): not done  Assets:  Communication Skills Desire for Improvement Housing Physical Health Social Support  ADL's:  Intact  Cognition: WNL  Sleep:  Good   Screenings: GAD-7     Office Visit from 08/03/2019 in [place] Office Visit from 07/04/2019 in [place] Office Visit from 02/20/2018 in Primary Care at [facility]  Total GAD-7 Score 20 20 18     PHQ2-9     Office Visit from 08/03/2019 in [place] Office Visit from 07/04/2019 in [place] Office Visit from 02/20/2018 in Primary Care at [facility] Visit from 05/06/2017 in [place]  PHQ-2 Total Score 2 4 3 2   PHQ-9 Total Score 11 20 16 12        Assessment and Plan:  24 yo DWFwithbipolar disorder and ADHD(the latter diagnosedwhen she was 74-30 years old). Unfortunately she does not remember medications she has been tried for either of these conditions just says that "there were many". She seems to recall names of Ritalin and Adderall. Available record also shows that she was tried on SSRIs (citalopram, fluoxetine), aripiprazole, oxcarbazepine, hydroxyzine, clonazepam. She describes her mood swings as being very rapid - happening several times during the day. The upswings are described and increased irritability, increased  energy, racing thoughts, impulsivity (no euphoria). The longest such an episode can last is less than a day and she denies ever having psychotic symptoms. She also denies having  hx of prolonged depressive episodes, suicidal thoughts although she admits OD on meds impulsively out of frustration when she was 13. She did not go to the hospital then and has npo hx of inpatient psychiatric admissions. She tried to self medicate with marijuana but has not used this drug "for a fewyears". She denies abusing other street or prescription drugs. At one time (few years ago) she was also drinking "too much" but she dos not do it any more. Cannabis use disorder is in full remission. WestartedLamictal titration and escitalopram 10 mg daily. Initially she had muscle cramps and some sedation but both have resolved.She also complainedof difficulty with falling asleep - trazodone has not been very effective for that (150 mg)andregularAmbien only helps her fall asleep but she is up after few hours.I have ordered Ambien CRbut it was not approved by her insurance.For the past few weeks she has been taking temazepam and it seems to be more effective (sleeps 5-6 hours on it).We have added Adderall XR 20 mg and while she found it helpful for concentration is was not yet optimal. We increased dose to 30 mg and she reacted with increased anxiety. The same happened when we tried Concerta 27 mg. She also run out of Lamictal (unclear why as she should have had a refill in pharmacy) and  She has been much more labile now.  Dx: Bipolar 2ultra rapid cycling; GAD; ADHD  Plan: We willcontinueescitalopram to20mg  for anxiety,Lamictal 150 mg bid, temazepam30 mg prn insomniaand change stimulant back to lower dose of AdderallXR20 mg.Next appointment in1 month.The plan was discussed with patient who had an opportunity to ask questions and these were all answered. I spend76minutes inphoneconsultation with the  patient.    Stephanie Acre, MD 08/07/2019, 10:59 AM

## 2019-08-08 ENCOUNTER — Encounter: Payer: Self-pay | Admitting: Nurse Practitioner

## 2019-08-08 ENCOUNTER — Other Ambulatory Visit: Payer: Self-pay | Admitting: Nurse Practitioner

## 2019-08-08 ENCOUNTER — Ambulatory Visit: Payer: Self-pay | Admitting: Family Medicine

## 2019-08-08 MED ORDER — FLUCONAZOLE 150 MG PO TABS
150.0000 mg | ORAL_TABLET | Freq: Once | ORAL | 0 refills | Status: AC
Start: 2019-08-08 — End: 2019-08-08

## 2019-08-10 ENCOUNTER — Ambulatory Visit: Payer: Medicaid Other

## 2019-08-10 DIAGNOSIS — M542 Cervicalgia: Secondary | ICD-10-CM | POA: Diagnosis not present

## 2019-08-14 ENCOUNTER — Encounter: Payer: Self-pay | Admitting: Specialist

## 2019-08-14 ENCOUNTER — Other Ambulatory Visit: Payer: Self-pay

## 2019-08-14 ENCOUNTER — Ambulatory Visit (INDEPENDENT_AMBULATORY_CARE_PROVIDER_SITE_OTHER): Payer: Medicaid Other | Admitting: Specialist

## 2019-08-14 ENCOUNTER — Ambulatory Visit (INDEPENDENT_AMBULATORY_CARE_PROVIDER_SITE_OTHER): Payer: Medicaid Other

## 2019-08-14 VITALS — BP 113/81 | HR 86 | Ht 61.0 in | Wt 198.0 lb

## 2019-08-14 DIAGNOSIS — M5136 Other intervertebral disc degeneration, lumbar region: Secondary | ICD-10-CM | POA: Diagnosis not present

## 2019-08-14 DIAGNOSIS — M542 Cervicalgia: Secondary | ICD-10-CM | POA: Diagnosis not present

## 2019-08-14 DIAGNOSIS — M5010 Cervical disc disorder with radiculopathy, unspecified cervical region: Secondary | ICD-10-CM

## 2019-08-14 DIAGNOSIS — R29898 Other symptoms and signs involving the musculoskeletal system: Secondary | ICD-10-CM | POA: Diagnosis not present

## 2019-08-14 MED ORDER — HYDROCODONE-ACETAMINOPHEN 5-325 MG PO TABS: 1.0000 | ORAL_TABLET | Freq: Four times a day (QID) | ORAL | 0 refills | Status: DC | PRN

## 2019-08-14 NOTE — Progress Notes (Addendum)
Office Visit Note   Patient: Kellie Martinez           Date of Birth: 1995/11/03           MRN: [id number] Visit Date: 08/14/2019              Requested by: Gildardo Pounds, NP [place],  [place] [address] PCP: Gildardo Pounds, NP   Assessment & Plan: Visit Diagnoses:  1. Cervicalgia   2. Herniation of cervical intervertebral disc with radiculopathy   3. Bilateral arm weakness     Plan: Avoid overhead lifting and overhead use of the arms. Do not lift greater than 5 lbs. Adjust head rest in vehicle to prevent hyperextension if rear ended. Take extra precautions to avoid falling, including use of a cane if you feel weak. Scheduling secretary Kandice Hams. will call you to arrange for surgery for your cervical spine. IIf you have worsening arm or leg numbness or weakness please call or go to an ER. We will contact your cardiologist and primary care physicians to seek clearance for your surgery. Surgery will be an artificial disc replacement ( Cervical disc arthroplasty) at the C5-6 level with decompression of the cervical spinal canal at C5-6 and removal of disc herniation pressing on the spinal cord. Replacement with metal and plastic disc. Local bone graft as well and allograft bone graft and vivigen.  Risks of surgery include risks of infection, bleeding and risks to the spinal cord and  Risks of sore throat and difficulty swallowing which should  Improve over the next 4-6 weeks following surgery. Surgery is indicated due to upper extremity radiculopathy, lermittes phenomena and falls. In the future surgery at adjacent levels may be necessary but these levels do not appear to be related to your current symptoms or signs. Hydrocodone for pain explained the risk of use of this medication in combination with  Temazepam and other sedatives, this should not be take at the same time but hours apart.  Not able to tolerate gabapentin. Has receive steroid dose pak in the  past. Taking lamictal and lexapro so that use of tramadol not recommended Over the counter, FDA and DEA approved Hemp CBD is a possible way to help with pain control, it is available as a purified  Product free of cannabis, I believe that its use is appropriate and within the acceptable substances not consider abusive or addictive.  Hemp CBD capsules, amazon.com 5,000-7,000 mg per bottle, 60 capsules per bottle, take one capsule twice a day.    Follow-Up Instructions: No follow-ups on file.   Orders:  Orders Placed This Encounter  Procedures  . XR Cervical Spine 2 or 3 views   No orders of the defined types were placed in this encounter.     Procedures: No procedures performed   Clinical Data: Findings:  MRI with mild degenerative disc changes C4-5, C5-6 and C6-7. There is a bilobed cervical disc herniation at C5-6 with left cord compression, no signal change within the cord. There is right sided disc  Protrusion  That minimally indents the right side lateral cord. The foramen are well maintained.  Small disc bulges noted at C4-5 and C6-7 without cervical stenosis at these levels. Acute bilobed cervical disc herniation C5-6 on chronic mild degenerative disc disease C4-5, C5-6 and C6-7, no facet arthrosis or uncovertebral arthrosis changes.     Subjective: Chief Complaint  Patient presents with  . Neck - Pain    24 year  old female with history of back pain that started Christmas Eve with a fall in the yard that happened walking on an incline with  2 dogs on leashes. The Bull dog weights about 50 lbs., she reports having fallen straight from her back to her neck. She was down for  alittle bit. Her fiance helped her get up.She has pain in both her neck and lower back. She reports that at first there was pain in the neck. She reports pain from the top on the posterior neck to the base of the neck in the midline and both her arms and legs go numb. When she  [place] [address] she reports the  arms and legs went numb. She had an appointment with Percell Miller and Noemi Chapel for her elbow so she went to see them. She had a previous fracture and fell in a hole while walking while staying at a hotel located in [place]. She was seen by [employer] saw in [place] at Their office and she was diagnosed with an elbow fracture that started with an A. She did home therapy and had a splint and a cast twice that happen 10/21/2018. She was seen an placed in a soft collar a few days after Christmas. She had xrays and soft collar.  She had an MRI and CT scan of the neck and the right elbow. She is numb in both arms now and weak in both arms. This has been going on 6 months now. She reports that she can hardly clean her home. She is having difficulty lifing and carrying items. She feels not more clumbsy but is weak and is unable to lift her children, 10,31 and 52 year olds. She is not having no bowel or bladder difficulty. No difficulty with walking but sometimes her legs go numb. She has to wait for the legs to get better and she can do something. She reports that her pain in the neck and back are the same degree "10 of 10". She is taking ibuprofen for pain and using hot showers.    Review of Systems  Constitutional: Positive for activity change, fever and unexpected weight change. Negative for appetite change, chills, diaphoresis and fatigue.  HENT: Positive for dental problem. Negative for congestion, drooling, ear discharge, ear pain, facial swelling, hearing loss, mouth sores, nosebleeds, postnasal drip, rhinorrhea, sinus pressure, sinus pain, sneezing, sore throat, tinnitus, trouble swallowing and voice change.   Respiratory: Negative.  Negative for apnea, cough, choking, chest tightness, shortness of breath, wheezing (history of asthma) and stridor.   Cardiovascular: Negative.  Negative for chest pain, palpitations and leg swelling.  Gastrointestinal: Negative for abdominal distention, abdominal pain, anal bleeding, blood in stool,  constipation, diarrhea, nausea, rectal pain and vomiting.  Genitourinary: Positive for hematuria. Negative for difficulty urinating, dyspareunia, dysuria, enuresis, flank pain and frequency.  Musculoskeletal: Positive for back pain (prior to fall no history of back pain or neck pain.), joint swelling, neck pain and neck stiffness. Negative for arthralgias, gait problem and myalgias.  Skin: Negative.  Negative for color change, pallor, rash and wound.  Allergic/Immunologic: Positive for food allergies (pinnapple). Negative for environmental allergies and immunocompromised state.  Neurological: Positive for weakness, light-headedness, numbness (both top and bottom of the hands both sides. ) and headaches (sharp headachs frontal and occipital and has difficulty with loud sounds sometime.). Negative for dizziness, tremors, seizures, syncope, facial asymmetry and speech difficulty.  Hematological: Negative for adenopathy. Bruises/bleeds easily.  Psychiatric/Behavioral: Negative.  Negative for agitation, behavioral problems, confusion, decreased concentration,  dysphoric mood, hallucinations, self-injury, sleep disturbance and suicidal ideas. The patient is not nervous/anxious and is not hyperactive.      Objective: Vital Signs: BP 113/81 (BP Location: Left Arm, Patient Position: Sitting)   Pulse 86   Ht 5\' 1"  (1.549 m)   Wt 198 lb (89.8 kg)   BMI 37.41 kg/m   Physical Exam Constitutional:      General: She is not in acute distress.    Appearance: Normal appearance. She is obese. She is not ill-appearing, toxic-appearing or diaphoretic.  HENT:     Head: Normocephalic.     Nose: Nose normal. No congestion or rhinorrhea.     Mouth/Throat:     Pharynx: Oropharynx is clear. No oropharyngeal exudate or posterior oropharyngeal erythema.  Eyes:     General: No scleral icterus.       Right eye: No discharge.        Left eye: No discharge.     Extraocular Movements: Extraocular movements intact.      Pupils: Pupils are equal, round, and reactive to light.  Pulmonary:     Effort: Pulmonary effort is normal.  Abdominal:     General: Abdomen is flat. There is no distension.     Palpations: Abdomen is soft. There is no mass.     Tenderness: There is no abdominal tenderness. There is no guarding or rebound.     Hernia: No hernia is present.  Musculoskeletal:        General: No swelling or deformity.     Right lower leg: No edema.  Skin:    General: Skin is warm and dry.     Back Exam   Tenderness  The patient is experiencing tenderness in the cervical.  Range of Motion  Extension:  50 abnormal  Flexion:  60 abnormal  Lateral bend right:  50 abnormal  Lateral bend left:  60 abnormal  Rotation right:  50 abnormal  Rotation left:  60 abnormal   Muscle Strength  Right Quadriceps:  5/5  Left Quadriceps:  5/5  Right Hamstrings:  5/5  Left Hamstrings:  5/5   Reflexes  Patellar: 3/4 Achilles: 2/4 Biceps: 2/4 Babinski's sign: normal   Other  Toe walk: abnormal Heel walk: abnormal  Comments:  Ataxia with standing and walking, off balance with toe and heel walking. Clonus bilateral legs with extensor tone increased both legs.  Hyper reflexia both legs at the knee.  Ankle reflexes are zero and symmetric. Weak shoulder abduction 4/5 bilaterally.  Weak biceps strength 4/5 bilaterally.       Specialty Comments:  No specialty comments available.  Imaging: No results found.   PMFS History: Patient Active Problem List   Diagnosis Date Noted  . GAD (generalized anxiety disorder) 01/03/2019  . Attention deficit hyperactivity disorder (ADHD), combined type 01/03/2019  . Bipolar 2 disorder ([place]) 01/03/2019  . Precordial pain 07/07/2017  . History of renal stone 05/18/2017  . Umbilical hernia 38/11/1749  . 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 Difficulty sleeping 05/06/2017  . Gastroesophageal reflux disease without esophagitis 05/06/2017   . History of drug overdose 05/06/2017   Past Medical History:  Diagnosis Date  . Anemia   . Anxiety   . Asthma   . Bipolar 1 disorder ([place])   . 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  . Chronic kidney disease   . Depression   . GERD (gastroesophageal reflux disease)   . Molar pregnancy   . Renal calculus or stone  had stent/ lithotripsy in past  . Sleep apnea   . Substance abuse ([place])   . Suicide attempt ([place])    x 2    Family History  Problem Relation Age of Onset  . Depression Mother   . Asthma Mother   . Hypertension Mother   . Mood Disorder Mother   . Heart disease Father   . Kidney disease Father   . Alcoholism Father   . Breast cancer Paternal Aunt   . Breast cancer Maternal Grandmother   . Breast cancer Cousin   . Alcoholism Brother   . Drug abuse Brother     Past Surgical History:  Procedure Laterality Date  . CYSTOSCOPY     x2 with stone extraction  . EXTRACORPOREAL SHOCK WAVE LITHOTRIPSY Right 06/20/2013   Procedure: EXTRACORPOREAL SHOCK WAVE LITHOTRIPSY (ESWL);  Surgeon: Edwin Dada, MD;  Location: [facility];  Service: Urology;  Laterality: Right;  . FOOT SURGERY Left 2003  . LITHOTRIPSY    . TUBAL LIGATION  2017  . TUBAL LIGATION     Social History   Occupational History  . Not on file  Tobacco Use  . Smoking status: Former Smoker    Packs/day: 0.25    Types: Cigarettes  . Smokeless tobacco: Never Used  Vaping Use  . Vaping Use: Some days  Substance and Sexual Activity  . Alcohol use: Not Currently    Comment: stopped drinking for 3 months.   . Drug use: Not Currently  . Sexual activity: Yes    Birth control/protection: Surgical

## 2019-08-14 NOTE — Patient Instructions (Addendum)
Plan: Avoid overhead lifting and overhead use of the arms. Do not lift greater than 5 lbs. Adjust head rest in vehicle to prevent hyperextension if rear ended. Take extra precautions to avoid falling, including use of a cane if you feel weak. Scheduling secretary Kandice Hams. will call you to arrange for surgery for your cervical spine. IIf you have worsening arm or leg numbness or weakness please call or go to an ER. We will contact your cardiologist and primary care physicians to seek clearance for your surgery. Surgery will be an artificial disc replacement ( Cervical disc arthroplasty) at the C5-6 level with decompression of the cervical spinal canal at C5-6 and removal of disc herniation pressing on the spinal cord. Replacement with metal and plastic disc. Local bone graft as well and allograft bone graft and vivigen.  Risks of surgery include risks of infection, bleeding and risks to the spinal cord and  Risks of sore throat and difficulty swallowing which should  Improve over the next 4-6 weeks following surgery. Surgery is indicated due to upper extremity radiculopathy, lermittes phenomena and falls. In the future surgery at adjacent levels may be necessary but these levels do not appear to be related to your current symptoms or signs. Hydrocodone for pain explained the risk of use of this medication in combination with  Temazepam and other sedatives, this should not be take at the same time but hours apart.  Not able to tolerate gabapentin. Has receive steroid dose pak in the past. Taking lamictal and lexapro so that use of tramadol not recommended Over the counter, FDA and DEA approved Hemp CBD is a possible way to help with pain control, it is available as a purified  Product free of cannabis, I believe that its use is appropriate and within the acceptable substances not consider abusive or addictive.  Hemp CBD capsules, amazon.com 5,000-7,000 mg per bottle, 60 capsules per bottle,  take one capsule twice a day.  Follow-Up Instructions: No follow-ups on file.

## 2019-08-15 ENCOUNTER — Telehealth: Payer: Self-pay | Admitting: Specialist

## 2019-08-15 NOTE — Telephone Encounter (Signed)
Patient called requesting a call back from Dr. Louanne Skye. Patient stated she brought her MRI results in to Dr. Louanne Skye and waiting for results to discuss neck surgery. Patient is asking for Dr. Louanne Skye to give pt a call

## 2019-08-15 NOTE — Telephone Encounter (Signed)
Patient called requesting a call back from Dr. Louanne Skye. Patient stated she brought her MRI results in to Dr. Louanne Skye and waiting for results to discuss neck surgery. Patient is asking for Dr. Louanne Skye to give pt a call. Patient phone number is [phone].

## 2019-08-17 NOTE — Telephone Encounter (Signed)
Results of MRI of the lumbar spine are normal, no nerve compression or stenosis, no significant disc degeneration. Kellie Martinez Findings discussed with  The patient. Will proceed with cervical spine intervention, recommended disc arthroplasty C5-6

## 2019-08-21 ENCOUNTER — Encounter: Payer: Self-pay | Admitting: Specialist

## 2019-08-22 ENCOUNTER — Telehealth: Payer: Self-pay | Admitting: Specialist

## 2019-08-22 ENCOUNTER — Encounter: Payer: Self-pay | Admitting: Specialist

## 2019-08-22 ENCOUNTER — Encounter: Payer: Self-pay | Admitting: Nurse Practitioner

## 2019-08-22 DIAGNOSIS — Z23 Encounter for immunization: Secondary | ICD-10-CM | POA: Diagnosis not present

## 2019-08-22 NOTE — Telephone Encounter (Signed)
See other note from MyChart message.

## 2019-08-22 NOTE — Telephone Encounter (Signed)
Patient called requesting a call back from Dr. Louanne Skye. Patient stated Dr. Louanne Skye recommended CBC/ [place] supplement herbals to inhale for pain. Patient is asking for a prescription and a letter for legal purposes. Please call patient about this matter for more details. Patient phone number is [phone].

## 2019-08-28 ENCOUNTER — Encounter: Payer: Self-pay | Admitting: Nurse Practitioner

## 2019-08-28 ENCOUNTER — Encounter: Payer: Self-pay | Admitting: Specialist

## 2019-08-29 NOTE — Telephone Encounter (Signed)
Patient's surgery is pending authorization from Medicaid.  Will you address the question about it being a serious problem?

## 2019-08-30 ENCOUNTER — Other Ambulatory Visit: Payer: Self-pay | Admitting: Family Medicine

## 2019-08-30 ENCOUNTER — Encounter: Payer: Self-pay | Admitting: Specialist

## 2019-08-30 MED ORDER — PREDNISONE 20 MG PO TABS: 20.0000 mg | ORAL_TABLET | Freq: Every day | ORAL | 0 refills | Status: DC

## 2019-08-30 NOTE — Telephone Encounter (Signed)
Replied to patient in separate My Chart message.

## 2019-08-31 ENCOUNTER — Encounter: Payer: Self-pay | Admitting: Nurse Practitioner

## 2019-08-31 ENCOUNTER — Other Ambulatory Visit (HOSPITAL_COMMUNITY): Payer: Self-pay | Admitting: Psychiatry

## 2019-09-03 NOTE — Telephone Encounter (Signed)
See message f rom patient

## 2019-09-04 ENCOUNTER — Encounter: Payer: Self-pay | Admitting: Specialist

## 2019-09-06 ENCOUNTER — Telehealth (INDEPENDENT_AMBULATORY_CARE_PROVIDER_SITE_OTHER): Payer: Medicaid Other | Admitting: Psychiatry

## 2019-09-06 ENCOUNTER — Encounter: Payer: Self-pay | Admitting: Nurse Practitioner

## 2019-09-06 ENCOUNTER — Other Ambulatory Visit: Payer: Self-pay

## 2019-09-06 DIAGNOSIS — F902 Attention-deficit hyperactivity disorder, combined type: Secondary | ICD-10-CM | POA: Diagnosis not present

## 2019-09-06 DIAGNOSIS — F3181 Bipolar II disorder: Secondary | ICD-10-CM | POA: Diagnosis not present

## 2019-09-06 DIAGNOSIS — F411 Generalized anxiety disorder: Secondary | ICD-10-CM

## 2019-09-06 MED ORDER — AMPHETAMINE-DEXTROAMPHET ER 25 MG PO CP24
25.0000 mg | ORAL_CAPSULE | ORAL | 0 refills | Status: DC
Start: 2019-11-05 — End: 2020-01-21

## 2019-09-06 MED ORDER — CLONAZEPAM 0.5 MG PO TABS: 0.5000 mg | ORAL_TABLET | Freq: Two times a day (BID) | ORAL | 0 refills | Status: DC | PRN

## 2019-09-06 MED ORDER — AMPHETAMINE-DEXTROAMPHET ER 25 MG PO CP24
25.0000 mg | ORAL_CAPSULE | ORAL | 0 refills | Status: DC
Start: 2019-10-06 — End: 2019-11-23

## 2019-09-06 MED ORDER — AMPHETAMINE-DEXTROAMPHET ER 25 MG PO CP24
25.0000 mg | ORAL_CAPSULE | ORAL | 0 refills | Status: DC
Start: 2019-09-06 — End: 2019-11-23

## 2019-09-06 NOTE — Progress Notes (Signed)
BH MD/PA/NP OP Progress Note  09/06/2019 9:44 AM Kellie Martinez  MRN:  [id number] Interview was conducted by phone and I verified that I was speaking with the correct person using two identifiers. I discussed the limitations of evaluation and management by telemedicine and  the availability of in person appointments. Patient expressed understanding and agreed to proceed. Patient location - home; physician - home office.  Chief Complaint: Anxiety.  HPI: 24 yo DWFwithbipolar disorder and ADHD(the latter diagnosedwhen she was 85-31 years old). Unfortunately she does not remember medications she has been tried for either of these conditions just says that "there were many". She seems to recall names of Ritalin and Adderall. Available record also shows that she was tried on SSRIs (citalopram, fluoxetine), aripiprazole, oxcarbazepine, hydroxyzine, clonazepam. She describes her mood swings as being very rapid - happening several times during the day. The upswings are described and increased irritability, increased energy, racing thoughts, impulsivity (no euphoria). The longest such an episode can last is less than a day and she denies ever having psychotic symptoms. She also denies having hx of prolonged depressive episodes, suicidal thoughts although she admits OD on meds impulsively out of frustration when she was 13. She did not go to the hospital then and has npo hx of inpatient psychiatric admissions. She tried to self medicate with marijuana but has not used this drug "for a fewyears". She denies abusing other street or prescription drugs. At one time (few years ago) she was also drinking "too much" but she dos not do it any more. Cannabis use disorder is in full remission. WestartedLamictal titration and escitalopram 10 mg daily. Initially she had muscle cramps and some sedation but both have resolved.She also complainedof difficulty with falling asleep - trazodone has not been very effective for  that (150 mg)andregularAmbien only helps her fall asleep but she is up after few hours.I have ordered Ambien CRbut it was not approved by her insurance.For the past few weeks she has been taking temazepam and it seems to be more effective (sleeps 5-6 hours on it).We have added Adderall XR 20 mgandwhile she foundit helpful for concentrationis was notyet optimal. We increased dose to 30 mg and she reacted with increased anxiety. The same happened when we tried Concerta 27 mg. We will try 25 mg this time. She continues to comolain of feeling stressed out (health issues in her family and her own), anxious often and is asking if she could have a medication she can take on as needed basis for that.  Visit Diagnosis:    ICD-10-CM   1. Attention deficit hyperactivity disorder (ADHD), combined type  F90.2   2. Bipolar 2 disorder ([place])  F31.81   3. GAD (generalized anxiety disorder)  F41.1     Past Psychiatric History: Please see intake H&P.  Past Medical History:  Past Medical History:  Diagnosis Date  . Anemia   . Anxiety   . Asthma   . Bipolar 1 disorder ([place])   . 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  . Chronic kidney disease   . Depression   . GERD (gastroesophageal reflux disease)   . Molar pregnancy   . Renal calculus or stone    had stent/ lithotripsy in past  . Sleep apnea   . Substance abuse ([place])   . Suicide attempt ([place])    x 2    Past Surgical History:  Procedure Laterality Date  . CYSTOSCOPY     x2 with stone extraction  . EXTRACORPOREAL SHOCK WAVE LITHOTRIPSY  Right 06/20/2013   Procedure: EXTRACORPOREAL SHOCK WAVE LITHOTRIPSY (ESWL);  Surgeon: Edwin Dada, MD;  Location: [facility];  Service: Urology;  Laterality: Right;  . FOOT SURGERY Left 2003  . LITHOTRIPSY    . TUBAL LIGATION  2017  . TUBAL LIGATION      Family Psychiatric History: Reviewed.  Family History:  Family History  Problem Relation Age of Onset  . Depression Mother   . Asthma Mother   . Hypertension  Mother   . Mood Disorder Mother   . Heart disease Father   . Kidney disease Father   . Alcoholism Father   . Breast cancer Paternal Aunt   . Breast cancer Maternal Grandmother   . Breast cancer Cousin   . Alcoholism Brother   . Drug abuse Brother     Social History:  Social History   Socioeconomic History  . Marital status: Significant Other    Spouse name: Not on file  . Number of children: 3  . Years of education: Not on file  . Highest education level: Not on file  Occupational History  . Not on file  Tobacco Use  . Smoking status: Former Smoker    Packs/day: 0.25    Types: Cigarettes  . Smokeless tobacco: Never Used  Vaping Use  . Vaping Use: Some days  Substance and Sexual Activity  . Alcohol use: Not Currently    Comment: stopped drinking for 3 months.   . Drug use: Not Currently  . Sexual activity: Yes    Birth control/protection: Surgical  Other Topics Concern  . Not on file  Social History Narrative  . Not on file   Social Determinants of Health   Financial Resource Strain:   . Difficulty of Paying Living Expenses:   Food Insecurity:   . Worried About Charity fundraiser in the Last Year:   . Arboriculturist in the Last Year:   Transportation Needs:   . Film/video editor (Medical):   Marland Kitchen Lack of Transportation (Non-Medical):   Physical Activity:   . Days of Exercise per Week:   . Minutes of Exercise per Session:   Stress:   . Feeling of Stress :   Social Connections:   . Frequency of Communication with Friends and Family:   . Frequency of Social Gatherings with Friends and Family:   . Attends Religious Services:   . Active Member of Clubs or Organizations:   . Attends Archivist Meetings:   Marland Kitchen Marital Status:     Allergies:  Allergies  Allergen Reactions  . Levofloxacin   . Penicillins Hives    No anaphylaxis  Did it involve swelling of the face/tongue/throat, SOB, or low BP? No Did it involve sudden or severe rash/hives,  skin peeling, or any reaction on the inside of your mouth or nose? Yes Did you need to seek medical attention at a hospital or doctor's office? Yes When did it last happen?Childhood If all above answers are "NO", may proceed with cephalosporin use.   Marland Kitchen Phenergan [Promethazine Hcl]     Tremors, restless lef, akathesia [id number]    Metabolic Disorder Labs: Lab Results  Component Value Date   HGBA1C 5.1 07/04/2019   No results found for: PROLACTIN Lab Results  Component Value Date   CHOL 108 03/13/2018   TRIG 45 03/13/2018   HDL 35 (L) 03/13/2018   CHOLHDL 3.1 03/13/2018   LDLCALC 64 03/13/2018   Lab Results  Component Value Date  TSH 1.170 03/13/2018    Therapeutic Level Labs: No results found for: LITHIUM No results found for: VALPROATE No components found for:  CBMZ  Current Medications: Current Outpatient Medications  Medication Sig Dispense Refill  . albuterol (VENTOLIN HFA) 108 (90 Base) MCG/ACT inhaler Inhale 1-2 puffs into the lungs every 6 (six) hours as needed for wheezing or shortness of breath. 18 g 0  . amphetamine-dextroamphetamine (ADDERALL XR) 25 MG 24 hr capsule Take 1 capsule by mouth every morning. 30 capsule 0  . [START ON 10/06/2019] amphetamine-dextroamphetamine (ADDERALL XR) 25 MG 24 hr capsule Take 1 capsule by mouth every morning. 30 capsule 0  . [START ON 11/05/2019] amphetamine-dextroamphetamine (ADDERALL XR) 25 MG 24 hr capsule Take 1 capsule by mouth every morning. 30 capsule 0  . clonazePAM (KLONOPIN) 0.5 MG tablet Take 1 tablet (0.5 mg total) by mouth 2 (two) times daily as needed for anxiety. 60 tablet 0  . escitalopram (LEXAPRO) 20 MG tablet TAKE 1 TABLET BY MOUTH EVERY DAY 30 tablet 2  . HYDROcodone-acetaminophen (NORCO/VICODIN) 5-325 MG tablet Take 1 tablet by mouth every 6 (six) hours as needed for moderate pain. 30 tablet 0  . ibuprofen (ADVIL) 600 MG tablet Take 1 tablet (600 mg total) by mouth every 6 (six) hours as needed. 30  tablet 0  . lamoTRIgine (LAMICTAL) 150 MG tablet Take 1 tablet (150 mg total) by mouth 2 (two) times daily. 60 tablet 2  . predniSONE (DELTASONE) 20 MG tablet Take 1 tablet (20 mg total) by mouth daily with breakfast. 5 tablet 0  . temazepam (RESTORIL) 30 MG capsule TAKE 1 CAPSULE BY MOUTH AT BEDTIME 30 capsule 2   No current facility-administered medications for this visit.      Psychiatric Specialty Exam: Review of Systems  Musculoskeletal: Positive for neck pain.  Psychiatric/Behavioral: Positive for decreased concentration. The patient is nervous/anxious.   All other systems reviewed and are negative.   There were no vitals taken for this visit.There is no height or weight on file to calculate BMI.  General Appearance: NA  Eye Contact:  NA  Speech:  Clear and Coherent and Normal Rate  Volume:  Normal  Mood:  Anxious  Affect:  NA  Thought Process:  Goal Directed  Orientation:  Full (Time, Place, and Person)  Thought Content: Logical   Suicidal Thoughts:  No  Homicidal Thoughts:  No  Memory:  Immediate;   Good Recent;   Good Remote;   Good  Judgement:  Fair  Insight:  Fair  Psychomotor Activity:  NA  Concentration:  Concentration: Fair  Recall:  Good  Fund of Knowledge: Fair  Language: Good  Akathisia:  Negative  Handed:  Right  AIMS (if indicated): not done  Assets:  Communication Skills Desire for Improvement Housing Resilience  ADL's:  Intact  Cognition: WNL  Sleep:  Fair   Screenings: GAD-7     Office Visit from 08/03/2019 in [place] Office Visit from 07/04/2019 in [place] Office Visit from 02/20/2018 in [facility] at [facility]  Total GAD-7 Score 20 20 18     PHQ2-9     Office Visit from 08/03/2019 in [place] Office Visit from 07/04/2019 in [place] Office Visit from 02/20/2018 in Primary Care at [facility] Visit  from 05/06/2017 in [place]  PHQ-2 Total Score 2 4 3 2   PHQ-9 Total Score 11 20  16 12       Assessment and Plan: 24 yo DWFwithbipolar disorder and ADHD(the latter diagnosedwhen she was 16-20 years old). Unfortunately she does not remember medications she has been tried for either of these conditions just says that "there were many". She seems to recall names of Ritalin and Adderall. Available record also shows that she was tried on SSRIs (citalopram, fluoxetine), aripiprazole, oxcarbazepine, hydroxyzine, clonazepam. She describes her mood swings as being very rapid - happening several times during the day. The upswings are described and increased irritability, increased energy, racing thoughts, impulsivity (no euphoria). The longest such an episode can last is less than a day and she denies ever having psychotic symptoms. She also denies having hx of prolonged depressive episodes, suicidal thoughts although she admits OD on meds impulsively out of frustration when she was 13. She did not go to the hospital then and has npo hx of inpatient psychiatric admissions. She tried to self medicate with marijuana but has not used this drug "for a fewyears". She denies abusing other street or prescription drugs. At one time (few years ago) she was also drinking "too much" but she dos not do it any more. Cannabis use disorder is in full remission. WestartedLamictal titration and escitalopram 10 mg daily. Initially she had muscle cramps and some sedation but both have resolved.She also complainedof difficulty with falling asleep - trazodone has not been very effective for that (150 mg)andregularAmbien only helps her fall asleep but she is up after few hours.I have ordered Ambien CRbut it was not approved by her insurance.For the past few weeks she has been taking temazepam and it seems to be more effective (sleeps 5-6 hours on it).We have added Adderall XR 20 mgandwhile she  foundit helpful for concentrationis was notyet optimal. We increased dose to 30 mg and she reacted with increased anxiety. The same happened when we tried Concerta 27 mg. We will try 25 mg this time. She continues to comolain of feeling stressed out, anxious often and is asking if she could have a medication she can take on as needed basis for that.  Dx: Bipolar 2ultra rapid cycling; GAD; ADHD  Plan: We willcontinueescitalopram to20mg  for anxiety,Lamictal 150 mg bid, temazepam30 mg prn insomniaandchangestimulant dose to 25 mg. I will add clonazepam 0.5 mg bid prn anxiety as well. Next appointment in1 month.The plan was discussed with patient who had an opportunity to ask questions and these were all answered. I spend45minutes inphoneconsultation with the patient.    Stephanie Acre, MD 09/06/2019, 9:44 AM

## 2019-09-07 ENCOUNTER — Telehealth: Payer: Self-pay | Admitting: Specialist

## 2019-09-07 NOTE — Telephone Encounter (Signed)
Patient called.   Said she brought her MRI disk in and wanted to know if we still had it? She wants it back  Call back: [phone]

## 2019-09-08 ENCOUNTER — Other Ambulatory Visit: Payer: Self-pay | Admitting: Nurse Practitioner

## 2019-09-08 DIAGNOSIS — S99921D Unspecified injury of right foot, subsequent encounter: Secondary | ICD-10-CM

## 2019-09-10 ENCOUNTER — Encounter: Payer: Self-pay | Admitting: Specialist

## 2019-09-11 ENCOUNTER — Encounter: Payer: Self-pay | Admitting: Specialist

## 2019-09-12 ENCOUNTER — Encounter: Payer: Self-pay | Admitting: Nurse Practitioner

## 2019-09-12 ENCOUNTER — Encounter: Payer: Self-pay | Admitting: Specialist

## 2019-09-12 ENCOUNTER — Telehealth: Payer: Self-pay

## 2019-09-12 NOTE — Telephone Encounter (Signed)
See my chart note.

## 2019-09-12 NOTE — Telephone Encounter (Signed)
Patient wanted to know if she can come get CD she dropped off . Also wanted to see about getting a note to say she is not able to work

## 2019-09-13 ENCOUNTER — Other Ambulatory Visit: Payer: Self-pay | Admitting: Nurse Practitioner

## 2019-09-13 DIAGNOSIS — E669 Obesity, unspecified: Secondary | ICD-10-CM

## 2019-09-13 NOTE — Telephone Encounter (Signed)
The decision letter was received on [employer](Medicaid system) today.  I need to talk to Amy Monday about how to handle it.

## 2019-09-14 ENCOUNTER — Encounter: Payer: Self-pay | Admitting: Specialist

## 2019-09-14 DIAGNOSIS — M542 Cervicalgia: Secondary | ICD-10-CM | POA: Diagnosis not present

## 2019-09-14 DIAGNOSIS — M47812 Spondylosis without myelopathy or radiculopathy, cervical region: Secondary | ICD-10-CM | POA: Diagnosis not present

## 2019-09-17 ENCOUNTER — Encounter: Payer: Self-pay | Admitting: Specialist

## 2019-09-18 ENCOUNTER — Telehealth (INDEPENDENT_AMBULATORY_CARE_PROVIDER_SITE_OTHER): Payer: Medicaid Other | Admitting: Psychiatry

## 2019-09-18 ENCOUNTER — Other Ambulatory Visit: Payer: Self-pay

## 2019-09-18 DIAGNOSIS — F3181 Bipolar II disorder: Secondary | ICD-10-CM

## 2019-09-18 DIAGNOSIS — F411 Generalized anxiety disorder: Secondary | ICD-10-CM

## 2019-09-18 DIAGNOSIS — F902 Attention-deficit hyperactivity disorder, combined type: Secondary | ICD-10-CM | POA: Diagnosis not present

## 2019-09-18 NOTE — Progress Notes (Signed)
BH MD/PA/NP OP Progress Note  09/18/2019 10:08 AM Kellie Martinez  MRN:  [id number] Interview was conducted by phone and I verified that I was speaking with the correct person using two identifiers. I discussed the limitations of evaluation and management by telemedicine and  the availability of in person appointments. Patient expressed understanding and agreed to proceed. Patient location - home; physician - home office.  Chief Complaint: Anxiety.  HPI: 24 yo DWFwithbipolar disorder and ADHD(the latter diagnosedwhen she was 48-83 years old). Unfortunately she does not remember medications she has been tried for either of these conditions just says that "there were many". She seems to recall names of Ritalin and Adderall. Available record also shows that she was tried on SSRIs (citalopram, fluoxetine), aripiprazole, oxcarbazepine, hydroxyzine, clonazepam. She describes her mood swings as being very rapid - happening several times during the day. The upswings are described and increased irritability, increased energy, racing thoughts, impulsivity (no euphoria). The longest such an episode can last is less than a day and she denies ever having psychotic symptoms. She also denies having hx of prolonged depressive episodes, suicidal thoughts although she admits OD on meds impulsively out of frustration when she was 13. She did not go to the hospital then and has npo hx of inpatient psychiatric admissions. She tried to self medicate with marijuana but has not used this drug "for a fewyears". She denies abusing other street or prescription drugs. At one time (few years ago) she was also drinking "too much" but she dos not do it any more. Cannabis use disorder is in full remission. WestartedLamictal titration and escitalopram 10 mg daily. Initially she had muscle cramps and some sedation but both have resolved.She also complainedof difficulty with falling asleep - trazodone has not been very effective for  that (150 mg)andregularAmbien only helps her fall asleep but she is up after few hours.I have ordered Ambien CRbut it was not approved by her insurance.For the past few weeks she has been taking temazepam and it seems to be more effective (sleeps 5-6 hours on it).We have added Adderall XR 20 mgandwhile she foundit helpful for concentrationis was notyet optimal. We increased dose to 30 mg and she reacted with increased anxiety (the same happened when we tried Concerta 27 mg) but tolerates 25 mg well.She complained of feeling stressed out, anxious often and we have added 0.5 mg of clonazepam for anxiety - she finds it effective.   Visit Diagnosis:    ICD-10-CM   1. Bipolar 2 disorder (HCC)  F31.81   2. Attention deficit hyperactivity disorder (ADHD), combined type  F90.2   3. GAD (generalized anxiety disorder)  F41.1     Past Psychiatric History: Please see intake H&P.  Past Medical History:  Past Medical History:  Diagnosis Date  . Anemia   . Anxiety   . Asthma   . Bipolar 1 disorder ([place])   . 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  . Chronic kidney disease   . Depression   . GERD (gastroesophageal reflux disease)   . Molar pregnancy   . Renal calculus or stone    had stent/ lithotripsy in past  . Sleep apnea   . Substance abuse ([place])   . Suicide attempt ([place])    x 2    Past Surgical History:  Procedure Laterality Date  . CYSTOSCOPY     x2 with stone extraction  . EXTRACORPOREAL SHOCK WAVE LITHOTRIPSY Right 06/20/2013   Procedure: EXTRACORPOREAL SHOCK WAVE LITHOTRIPSY (ESWL);  Surgeon: Edwin Dada, MD;  Location: [facility];  Service: Urology;  Laterality: Right;  . FOOT SURGERY Left 2003  . LITHOTRIPSY    . TUBAL LIGATION  2017  . TUBAL LIGATION      Family Psychiatric History: Reviewed.  Family History:  Family History  Problem Relation Age of Onset  . Depression Mother   . Asthma Mother   . Hypertension Mother   . Mood Disorder Mother   . Heart disease Father   .  Kidney disease Father   . Alcoholism Father   . Breast cancer Paternal Aunt   . Breast cancer Maternal Grandmother   . Breast cancer Cousin   . Alcoholism Brother   . Drug abuse Brother     Social History:  Social History   Socioeconomic History  . Marital status: Significant Other    Spouse name: Not on file  . Number of children: 3  . Years of education: Not on file  . Highest education level: Not on file  Occupational History  . Not on file  Tobacco Use  . Smoking status: Former Smoker    Packs/day: 0.25    Types: Cigarettes  . Smokeless tobacco: Never Used  Vaping Use  . Vaping Use: Some days  Substance and Sexual Activity  . Alcohol use: Not Currently    Comment: stopped drinking for 3 months.   . Drug use: Not Currently  . Sexual activity: Yes    Birth control/protection: Surgical  Other Topics Concern  . Not on file  Social History Narrative  . Not on file   Social Determinants of Health   Financial Resource Strain:   . Difficulty of Paying Living Expenses:   Food Insecurity:   . Worried About Charity fundraiser in the Last Year:   . Arboriculturist in the Last Year:   Transportation Needs:   . Film/video editor (Medical):   Marland Kitchen Lack of Transportation (Non-Medical):   Physical Activity:   . Days of Exercise per Week:   . Minutes of Exercise per Session:   Stress:   . Feeling of Stress :   Social Connections:   . Frequency of Communication with Friends and Family:   . Frequency of Social Gatherings with Friends and Family:   . Attends Religious Services:   . Active Member of Clubs or Organizations:   . Attends Archivist Meetings:   Marland Kitchen Marital Status:     Allergies:  Allergies  Allergen Reactions  . Levofloxacin   . Penicillins Hives    No anaphylaxis  Did it involve swelling of the face/tongue/throat, SOB, or low BP? No Did it involve sudden or severe rash/hives, skin peeling, or any reaction on the inside of your mouth or  nose? Yes Did you need to seek medical attention at a hospital or doctor's office? Yes When did it last happen?Childhood If all above answers are "NO", may proceed with cephalosporin use.   Marland Kitchen Phenergan [Promethazine Hcl]     Tremors, restless lef, akathesia [phone]    Metabolic Disorder Labs: Lab Results  Component Value Date   HGBA1C 5.1 07/04/2019   No results found for: PROLACTIN Lab Results  Component Value Date   CHOL 108 03/13/2018   TRIG 45 03/13/2018   HDL 35 (L) 03/13/2018   CHOLHDL 3.1 03/13/2018   [place] 64 03/13/2018   Lab Results  Component Value Date   TSH 1.170 03/13/2018    Therapeutic Level Labs: No results found for: LITHIUM No results  found for: VALPROATE No components found for:  CBMZ  Current Medications: Current Outpatient Medications  Medication Sig Dispense Refill  . albuterol (VENTOLIN HFA) 108 (90 Base) MCG/ACT inhaler Inhale 1-2 puffs into the lungs every 6 (six) hours as needed for wheezing or shortness of breath. 18 g 0  . amphetamine-dextroamphetamine (ADDERALL XR) 25 MG 24 hr capsule Take 1 capsule by mouth every morning. 30 capsule 0  . [START ON 10/06/2019] amphetamine-dextroamphetamine (ADDERALL XR) 25 MG 24 hr capsule Take 1 capsule by mouth every morning. 30 capsule 0  . [START ON 11/05/2019] amphetamine-dextroamphetamine (ADDERALL XR) 25 MG 24 hr capsule Take 1 capsule by mouth every morning. 30 capsule 0  . clonazePAM (KLONOPIN) 0.5 MG tablet Take 1 tablet (0.5 mg total) by mouth 2 (two) times daily as needed for anxiety. 60 tablet 0  . escitalopram (LEXAPRO) 20 MG tablet TAKE 1 TABLET BY MOUTH EVERY DAY 30 tablet 2  . HYDROcodone-acetaminophen (NORCO/VICODIN) 5-325 MG tablet Take 1 tablet by mouth every 6 (six) hours as needed for moderate pain. 30 tablet 0  . ibuprofen (ADVIL) 600 MG tablet Take 1 tablet (600 mg total) by mouth every 6 (six) hours as needed. 30 tablet 0  . lamoTRIgine (LAMICTAL) 150 MG tablet Take 1 tablet  (150 mg total) by mouth 2 (two) times daily. 60 tablet 2  . predniSONE (DELTASONE) 20 MG tablet Take 1 tablet (20 mg total) by mouth daily with breakfast. 5 tablet 0  . temazepam (RESTORIL) 30 MG capsule TAKE 1 CAPSULE BY MOUTH AT BEDTIME 30 capsule 2   No current facility-administered medications for this visit.      Psychiatric Specialty Exam: Review of Systems  Musculoskeletal: Positive for neck pain.  Psychiatric/Behavioral: The patient is nervous/anxious.   All other systems reviewed and are negative.   There were no vitals taken for this visit.There is no height or weight on file to calculate BMI.  General Appearance: NA  Eye Contact:  NA  Speech:  Clear and Coherent and Normal Rate  Volume:  Normal  Mood:  Anxious  Affect:  NA  Thought Process:  Goal Directed  Orientation:  Full (Time, Place, and Person)  Thought Content: Rumination   Suicidal Thoughts:  No  Homicidal Thoughts:  No  Memory:  Immediate;   Fair Recent;   Fair Remote;   Fair  Judgement:  Fair  Insight:  Fair  Psychomotor Activity:  NA  Concentration:  Concentration: Fair  Recall:  [employer] of Knowledge: Fair  Language: Good  Akathisia:  Negative  Handed:  Right  AIMS (if indicated): not done  Assets:  Desire for Improvement Housing Resilience Social Support  ADL's:  Intact  Cognition: WNL  Sleep:  Good   Screenings: GAD-7     Office Visit from 08/03/2019 in [place] Office Visit from 07/04/2019 in [place] Office Visit from 02/20/2018 in [facility] Care at [facility]  Total GAD-7 Score 20 20 18     PHQ2-9     Office Visit from 08/03/2019 in [place] Office Visit from 07/04/2019 in [place] Office Visit from 02/20/2018 in Primary Care at [facility] Visit from 05/06/2017 in [place]  PHQ-2 Total Score 2 4 3 2   PHQ-9 Total Score 11  20 16 12        Assessment and Plan: 24 yo DWFwithbipolar disorder and ADHD(the latter diagnosedwhen she  was 24-60 years old). Unfortunately she does not remember medications she has been tried for either of these conditions just says that "there were many". She seems to recall names of Ritalin and Adderall. Available record also shows that she was tried on SSRIs (citalopram, fluoxetine), aripiprazole, oxcarbazepine, hydroxyzine, clonazepam. She describes her mood swings as being very rapid - happening several times during the day. The upswings are described and increased irritability, increased energy, racing thoughts, impulsivity (no euphoria). The longest such an episode can last is less than a day and she denies ever having psychotic symptoms. She also denies having hx of prolonged depressive episodes, suicidal thoughts although she admits OD on meds impulsively out of frustration when she was 13. She did not go to the hospital then and has npo hx of inpatient psychiatric admissions. She tried to self medicate with marijuana but has not used this drug "for a fewyears". She denies abusing other street or prescription drugs. At one time (few years ago) she was also drinking "too much" but she dos not do it any more. Cannabis use disorder is in full remission. WestartedLamictal titration and escitalopram 10 mg daily. Initially she had muscle cramps and some sedation but both have resolved.She also complainedof difficulty with falling asleep - trazodone has not been very effective for that (150 mg)andregularAmbien only helps her fall asleep but she is up after few hours.I have ordered Ambien CRbut it was not approved by her insurance.For the past few weeks she has been taking temazepam and it seems to be more effective (sleeps 5-6 hours on it).We have added Adderall XR 20 mgandwhile she foundit helpful for concentrationis was notyet optimal. We increased dose to 30 mg and she reacted with  increased anxiety (the same happened when we tried Concerta 27 mg) but tolerates 25 mg well.She complained of feeling stressed out, anxious often and we have added 0.5 mg of clonazepam for anxiety - she finds it effective.   Dx: Bipolar 2ultra rapid cycling; GAD; ADHD  Plan: Continueescitalopram to20mg  for anxiety,Lamictal 150 mg bid, temazepam30 mg prn insomnia, Adderall XR  25 mg and clonazepam 0.5 mg bid prn anxiety. Denzil is concerned that her application for SS disability was denied and is asking if we can help her. I will write a letter of support and recommended talking to an attorney who specializes in disability issues.Next appointment in3 weeks.The plan was discussed with patient who had an opportunity to ask questions and these were all answered. I spend71minutes inphoneconsultation with the patient.    Stephanie Acre, MD 09/18/2019, 10:08 AM

## 2019-09-20 ENCOUNTER — Encounter (HOSPITAL_COMMUNITY): Payer: Self-pay | Admitting: Psychiatry

## 2019-09-25 ENCOUNTER — Encounter: Payer: Self-pay | Admitting: Nurse Practitioner

## 2019-10-01 ENCOUNTER — Encounter: Payer: Self-pay | Admitting: Nurse Practitioner

## 2019-10-02 ENCOUNTER — Encounter: Payer: Self-pay | Admitting: Nurse Practitioner

## 2019-10-03 ENCOUNTER — Encounter: Payer: Self-pay | Admitting: Specialist

## 2019-10-07 ENCOUNTER — Other Ambulatory Visit: Payer: Self-pay

## 2019-10-07 ENCOUNTER — Ambulatory Visit
Admission: EM | Admit: 2019-10-07 | Discharge: 2019-10-07 | Disposition: A | Discharge status: Home / Self Care | Payer: Medicaid Other | Attending: Emergency Medicine | Admitting: Emergency Medicine

## 2019-10-07 ENCOUNTER — Encounter: Payer: Self-pay | Admitting: Emergency Medicine

## 2019-10-07 DIAGNOSIS — M79674 Pain in right toe(s): Secondary | ICD-10-CM | POA: Diagnosis not present

## 2019-10-07 DIAGNOSIS — S99921A Unspecified injury of right foot, initial encounter: Secondary | ICD-10-CM

## 2019-10-07 NOTE — ED Triage Notes (Signed)
Pain and swelling to RT 5th toe since hitting it on a metal piece of a chair last night.

## 2019-10-07 NOTE — Discharge Instructions (Addendum)
X-ray was ordered.  Please have it completed any pain. We will call you if your result is abnormal Continue to take OTC ibuprofen as needed for pain Follow RICE instruction as attached Follow-up with PCP Return or go to ED for worsening of symptoms

## 2019-10-07 NOTE — ED Provider Notes (Signed)
[place]   [id number] 10/07/19 Arrival Time: 0804   Chief Complaint  Patient presents with  . Toe Injury     SUBJECTIVE: History from: patient.  Kellie Martinez is a 24 y.o. female who presented to the urgent care for complaint of right fifth toe pain and swelling that occurred 4 days ago.  She states she hit her toe against a metal piece off a chair.  She localizes the pain to the right fifth toe.  She describes the pain as constant and achy.  She has tried OTC medications without relief.  Her symptoms are made worse with ROM.  She denies similar symptoms in the past.  Denies chills, fever, nausea, vomiting, diarrhea  ROS: As per HPI.  All other pertinent ROS negative.      Past Medical History:  Diagnosis Date  . Anemia   . Anxiety   . Asthma   . Bipolar 1 disorder ([place])   . 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  . Chronic kidney disease   . Depression   . GERD (gastroesophageal reflux disease)   . Molar pregnancy   . Renal calculus or stone    had stent/ lithotripsy in past  . Sleep apnea   . Substance abuse ([place])   . Suicide attempt ([place])    x 2   Past Surgical History:  Procedure Laterality Date  . CYSTOSCOPY     x2 with stone extraction  . EXTRACORPOREAL SHOCK WAVE LITHOTRIPSY Right 06/20/2013   Procedure: EXTRACORPOREAL SHOCK WAVE LITHOTRIPSY (ESWL);  Surgeon: Edwin Dada, MD;  Location: [facility];  Service: Urology;  Laterality: Right;  . FOOT SURGERY Left 2003  . LITHOTRIPSY    . TUBAL LIGATION  2017  . TUBAL LIGATION     Allergies  Allergen Reactions  . Levofloxacin   . Penicillins Hives    No anaphylaxis  Did it involve swelling of the face/tongue/throat, SOB, or low BP? No Did it involve sudden or severe rash/hives, skin peeling, or any reaction on the inside of your mouth or nose? Yes Did you need to seek medical attention at a hospital or doctor's office? Yes When did it last happen?Childhood If all above answers are "NO", may proceed with  cephalosporin use.   Marland Kitchen Phenergan [Promethazine Hcl]     Tremors, restless lef, akathesia 01-26-2018   No current facility-administered medications on file prior to encounter.   Current Outpatient Medications on File Prior to Encounter  Medication Sig Dispense Refill  . albuterol (VENTOLIN HFA) 108 (90 Base) MCG/ACT inhaler Inhale 1-2 puffs into the lungs every 6 (six) hours as needed for wheezing or shortness of breath. 18 g 0  . amphetamine-dextroamphetamine (ADDERALL XR) 25 MG 24 hr capsule Take 1 capsule by mouth every morning. 30 capsule 0  . amphetamine-dextroamphetamine (ADDERALL XR) 25 MG 24 hr capsule Take 1 capsule by mouth every morning. 30 capsule 0  . [START ON 11/05/2019] amphetamine-dextroamphetamine (ADDERALL XR) 25 MG 24 hr capsule Take 1 capsule by mouth every morning. 30 capsule 0  . clonazePAM (KLONOPIN) 0.5 MG tablet Take 1 tablet (0.5 mg total) by mouth 2 (two) times daily as needed for anxiety. 60 tablet 0  . escitalopram (LEXAPRO) 20 MG tablet TAKE 1 TABLET BY MOUTH EVERY DAY 30 tablet 2  . HYDROcodone-acetaminophen (NORCO/VICODIN) 5-325 MG tablet Take 1 tablet by mouth every 6 (six) hours as needed for moderate pain. 30 tablet 0  . ibuprofen (ADVIL) 600 MG tablet Take 1 tablet (600 mg total) by  mouth every 6 (six) hours as needed. 30 tablet 0  . lamoTRIgine (LAMICTAL) 150 MG tablet Take 1 tablet (150 mg total) by mouth 2 (two) times daily. 60 tablet 2  . predniSONE (DELTASONE) 20 MG tablet Take 1 tablet (20 mg total) by mouth daily with breakfast. 5 tablet 0  . temazepam (RESTORIL) 30 MG capsule TAKE 1 CAPSULE BY MOUTH AT BEDTIME 30 capsule 2   Social History   Socioeconomic History  . Marital status: Significant Other    Spouse name: Not on file  . Number of children: 3  . Years of education: Not on file  . Highest education level: Not on file  Occupational History  . Not on file  Tobacco Use  . Smoking status: Former Smoker    Packs/day: 0.25    Types:  Cigarettes  . Smokeless tobacco: Never Used  Vaping Use  . Vaping Use: Some days  Substance and Sexual Activity  . Alcohol use: Not Currently    Comment: stopped drinking for 3 months.   . Drug use: Not Currently  . Sexual activity: Yes    Birth control/protection: Surgical  Other Topics Concern  . Not on file  Social History Narrative  . Not on file   Social Determinants of Health   Financial Resource Strain:   . Difficulty of Paying Living Expenses: Not on file  Food Insecurity:   . Worried About Charity fundraiser in the Last Year: Not on file  . Ran Out of Food in the Last Year: Not on file  Transportation Needs:   . Lack of Transportation (Medical): Not on file  . Lack of Transportation (Non-Medical): Not on file  Physical Activity:   . Days of Exercise per Week: Not on file  . Minutes of Exercise per Session: Not on file  Stress:   . Feeling of Stress : Not on file  Social Connections:   . Frequency of Communication with Friends and Family: Not on file  . Frequency of Social Gatherings with Friends and Family: Not on file  . Attends Religious Services: Not on file  . Active Member of Clubs or Organizations: Not on file  . Attends Archivist Meetings: Not on file  . Marital Status: Not on file  Intimate Partner Violence:   . Fear of Current or Ex-Partner: Not on file  . Emotionally Abused: Not on file  . Physically Abused: Not on file  . Sexually Abused: Not on file   Family History  Problem Relation Age of Onset  . Depression Mother   . Asthma Mother   . Hypertension Mother   . Mood Disorder Mother   . Heart disease Father   . Kidney disease Father   . Alcoholism Father   . Breast cancer Paternal Aunt   . Breast cancer Maternal Grandmother   . Breast cancer Cousin   . Alcoholism Brother   . Drug abuse Brother     OBJECTIVE:  Vitals:   10/07/19 0822 10/07/19 0824  BP:  97/64  Pulse:  92  Resp:  19  Temp:  98.3 F (36.8 C)  TempSrc:   Oral  SpO2:  96%  Weight: 198 lb (89.8 kg)   Height: 5\' 1"  (1.549 m)      Physical Exam Vitals and nursing note reviewed.  Constitutional:      General: She is not in acute distress.    Appearance: Normal appearance. She is normal weight. She is not ill-appearing, toxic-appearing or  diaphoretic.  HENT:     Head: Normocephalic.  Cardiovascular:     Rate and Rhythm: Normal rate and regular rhythm.     Pulses: Normal pulses.     Heart sounds: Normal heart sounds. No murmur heard.  No friction rub. No gallop.   Pulmonary:     Effort: Pulmonary effort is normal. No respiratory distress.     Breath sounds: Normal breath sounds. No stridor. No wheezing, rhonchi or rales.  Chest:     Chest wall: No tenderness.  Musculoskeletal:        General: Tenderness present.     Right foot: Swelling and tenderness present.     Comments: Patient is able to ambulate and bear weight.  The right foot is with obvious deformity when compared to the left foot.  No surface trauma, ecchymosis, lesion, open wound.  Swelling and tenderness present on the fifth metacarpal.  Limited range of motion of the fifth toe.  Neurovascular status intact.  Neurological:     Mental Status: She is alert and oriented to person, place, and time.     LABS:  No results found for this or any previous visit (from the past 24 hour(s)).   ASSESSMENT & PLAN:  1. Pain of toe of right foot   2. Injury of toe on right foot, initial encounter     No orders of the defined types were placed in this encounter.  Discharge Instructions X-ray was ordered.  Please have it completed any pain. We will call you if your result is abnormal Continue to take OTC ibuprofen as needed for pain Follow RICE instruction as attached Follow-up with PCP Return or go to ED for worsening of symptoms  Reviewed expectations re: course of current medical issues. Questions answered. Outlined signs and symptoms indicating need for more acute  intervention. Patient verbalized understanding. After Visit Summary given.      Note: This document was prepared using Dragon voice recognition software and may include unintentional dictation errors.    Emerson Monte, [place] 10/07/19 [phone]

## 2019-10-10 ENCOUNTER — Encounter: Payer: Self-pay | Admitting: Nurse Practitioner

## 2019-10-10 ENCOUNTER — Other Ambulatory Visit: Payer: Self-pay

## 2019-10-10 ENCOUNTER — Telehealth (INDEPENDENT_AMBULATORY_CARE_PROVIDER_SITE_OTHER): Payer: Medicaid Other | Admitting: Psychiatry

## 2019-10-10 ENCOUNTER — Other Ambulatory Visit: Payer: Self-pay | Admitting: Nurse Practitioner

## 2019-10-10 DIAGNOSIS — F902 Attention-deficit hyperactivity disorder, combined type: Secondary | ICD-10-CM | POA: Diagnosis not present

## 2019-10-10 DIAGNOSIS — F3181 Bipolar II disorder: Secondary | ICD-10-CM

## 2019-10-10 DIAGNOSIS — F411 Generalized anxiety disorder: Secondary | ICD-10-CM | POA: Diagnosis not present

## 2019-10-10 MED ORDER — LAMOTRIGINE 150 MG PO TABS: 150.0000 mg | ORAL_TABLET | Freq: Two times a day (BID) | ORAL | 2 refills | Status: DC

## 2019-10-10 MED ORDER — PREDNISONE 20 MG PO TABS: 20.0000 mg | ORAL_TABLET | Freq: Every day | ORAL | 0 refills | Status: DC

## 2019-10-10 MED ORDER — ESZOPICLONE 3 MG PO TABS: 3.0000 mg | ORAL_TABLET | Freq: Every evening | ORAL | 1 refills | Status: DC | PRN

## 2019-10-10 MED ORDER — ESCITALOPRAM OXALATE 20 MG PO TABS: 20.0000 mg | ORAL_TABLET | Freq: Every day | ORAL | 2 refills | Status: DC

## 2019-10-10 MED ORDER — CLONAZEPAM 1 MG PO TABS: 1.0000 mg | ORAL_TABLET | Freq: Two times a day (BID) | ORAL | 1 refills | Status: DC | PRN

## 2019-10-10 NOTE — Progress Notes (Signed)
[facility] MD/PA/NP OP Progress Note  10/10/2019 1:16 PM Kellie Martinez  MRN:  [id number] Interview was conducted by phone and I verified that I was speaking with the correct person using two identifiers. I discussed the limitations of evaluation and management by telemedicine and  the availability of in person appointments. Patient expressed understanding and agreed to proceed. Patient location - home; physician - home office.  Chief Complaint: Anxiety, insomnia.  HPI: 24 yo DWFwithbipolar disorder and ADHD(the latter diagnosedwhen she was 18-73 years old). Unfortunately she does not remember medications she has been tried for either of these conditions just says that "there were many". She seems to recall names of Ritalin and Adderall. Available record also shows that she was tried on SSRIs (citalopram, fluoxetine), aripiprazole, oxcarbazepine, hydroxyzine, clonazepam. She describes her mood swings as being very rapid - happening several times during the day. The upswings are described and increased irritability, increased energy, racing thoughts, impulsivity (no euphoria). The longest such an episode can last is less than a day and she denies ever having psychotic symptoms. She also denies having hx of prolonged depressive episodes, suicidal thoughts although she admits OD on meds impulsively out of frustration when she was 13. She did not go to the hospital then and has npo hx of inpatient psychiatric admissions. She tried to self medicate with marijuana but has not used this drug "for a fewyears". She denies abusing other street or prescription drugs. At one time (few years ago) she was also drinking "too much" but she dos not do it any more. Cannabis use disorder is in full remission. WestartedLamictal titration and escitalopram 10 mg daily. Initially she had muscle cramps and some sedation but both have resolved.She also complainedof difficulty with falling asleep - trazodone has not been very  effective for that (150 mg)andregularAmbien only helps her fall asleep but she is up after few hours.I have ordered Ambien CRbut it was not approved by her insurance.For the past few weeks she has been taking temazepam and it initially seemed to be more effective but now she states that it takes few hours for her to fall asleep and she continues to wake up in the middle of the night. We have added Adderall XR 20 mgandwhile she foundit helpful for concentrationis was notyet optimal. We increased dose to 30 mg and she reacted with increased anxiety (the same happened when we tried Concerta 27 mg) but tolerates 25 mg well.She complained of feeling stressed out, anxious often and we have added clonazepam for anxiety - she finds it effective.   Visit Diagnosis:    ICD-10-CM   1. Attention deficit hyperactivity disorder (ADHD), combined type  F90.2   2. Bipolar 2 disorder ([place])  F31.81   3. GAD (generalized anxiety disorder)  F41.1     Past Psychiatric History: Please see intake H&P.  Past Medical History:  Past Medical History:  Diagnosis Date  . Anemia   . Anxiety   . Asthma   . Bipolar 1 disorder ([place])   . 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  . Chronic kidney disease   . Depression   . GERD (gastroesophageal reflux disease)   . Molar pregnancy   . Renal calculus or stone    had stent/ lithotripsy in past  . Sleep apnea   . Substance abuse ([place])   . Suicide attempt ([place])    x 2    Past Surgical History:  Procedure Laterality Date  . CYSTOSCOPY     x2 with stone extraction  .  EXTRACORPOREAL SHOCK WAVE LITHOTRIPSY Right 06/20/2013   Procedure: EXTRACORPOREAL SHOCK WAVE LITHOTRIPSY (ESWL);  Surgeon: Edwin Dada, MD;  Location: [facility];  Service: Urology;  Laterality: Right;  . FOOT SURGERY Left 2003  . LITHOTRIPSY    . TUBAL LIGATION  2017  . TUBAL LIGATION      Family Psychiatric History: Reviwed.  Family History:  Family History  Problem Relation Age of Onset  . Depression  Mother   . Asthma Mother   . Hypertension Mother   . Mood Disorder Mother   . Heart disease Father   . Kidney disease Father   . Alcoholism Father   . Breast cancer Paternal Aunt   . Breast cancer Maternal Grandmother   . Breast cancer Cousin   . Alcoholism Brother   . Drug abuse Brother     Social History:  Social History   Socioeconomic History  . Marital status: Significant Other    Spouse name: Not on file  . Number of children: 3  . Years of education: Not on file  . Highest education level: Not on file  Occupational History  . Not on file  Tobacco Use  . Smoking status: Former Smoker    Packs/day: 0.25    Types: Cigarettes  . Smokeless tobacco: Never Used  Vaping Use  . Vaping Use: Some days  Substance and Sexual Activity  . Alcohol use: Not Currently    Comment: stopped drinking for 3 months.   . Drug use: Not Currently  . Sexual activity: Yes    Birth control/protection: Surgical  Other Topics Concern  . Not on file  Social History Narrative  . Not on file   Social Determinants of Health   Financial Resource Strain:   . Difficulty of Paying Living Expenses: Not on file  Food Insecurity:   . Worried About Charity fundraiser in the Last Year: Not on file  . Ran Out of Food in the Last Year: Not on file  Transportation Needs:   . Lack of Transportation (Medical): Not on file  . Lack of Transportation (Non-Medical): Not on file  Physical Activity:   . Days of Exercise per Week: Not on file  . Minutes of Exercise per Session: Not on file  Stress:   . Feeling of Stress : Not on file  Social Connections:   . Frequency of Communication with Friends and Family: Not on file  . Frequency of Social Gatherings with Friends and Family: Not on file  . Attends Religious Services: Not on file  . Active Member of Clubs or Organizations: Not on file  . Attends Archivist Meetings: Not on file  . Marital Status: Not on file    Allergies:  Allergies   Allergen Reactions  . Levofloxacin   . Penicillins Hives    No anaphylaxis  Did it involve swelling of the face/tongue/throat, SOB, or low BP? No Did it involve sudden or severe rash/hives, skin peeling, or any reaction on the inside of your mouth or nose? Yes Did you need to seek medical attention at a hospital or doctor's office? Yes When did it last happen?Childhood If all above answers are "NO", may proceed with cephalosporin use.   Marland Kitchen Phenergan [Promethazine Hcl]     Tremors, restless lef, akathesia [id number]    Metabolic Disorder Labs: Lab Results  Component Value Date   HGBA1C 5.1 07/04/2019   No results found for: PROLACTIN Lab Results  Component Value Date   CHOL 108  03/13/2018   TRIG 45 03/13/2018   HDL 35 (L) 03/13/2018   CHOLHDL 3.1 03/13/2018   LDLCALC 64 03/13/2018   Lab Results  Component Value Date   TSH 1.170 03/13/2018    Therapeutic Level Labs: No results found for: LITHIUM No results found for: VALPROATE No components found for:  CBMZ  Current Medications: Current Outpatient Medications  Medication Sig Dispense Refill  . albuterol (VENTOLIN HFA) 108 (90 Base) MCG/ACT inhaler Inhale 1-2 puffs into the lungs every 6 (six) hours as needed for wheezing or shortness of breath. 18 g 0  . amphetamine-dextroamphetamine (ADDERALL XR) 25 MG 24 hr capsule Take 1 capsule by mouth every morning. 30 capsule 0  . amphetamine-dextroamphetamine (ADDERALL XR) 25 MG 24 hr capsule Take 1 capsule by mouth every morning. 30 capsule 0  . [START ON 11/05/2019] amphetamine-dextroamphetamine (ADDERALL XR) 25 MG 24 hr capsule Take 1 capsule by mouth every morning. 30 capsule 0  . clonazePAM (KLONOPIN) 1 MG tablet Take 1 tablet (1 mg total) by mouth 2 (two) times daily as needed for anxiety. 60 tablet 1  . [START ON 10/29/2019] escitalopram (LEXAPRO) 20 MG tablet Take 1 tablet (20 mg total) by mouth daily. 30 tablet 2  . Eszopiclone 3 MG TABS Take 1 tablet (3 mg total)  by mouth at bedtime as needed (sleep). Take immediately before bedtime 30 tablet 1  . HYDROcodone-acetaminophen (NORCO/VICODIN) 5-325 MG tablet Take 1 tablet by mouth every 6 (six) hours as needed for moderate pain. 30 tablet 0  . ibuprofen (ADVIL) 600 MG tablet Take 1 tablet (600 mg total) by mouth every 6 (six) hours as needed. 30 tablet 0  . [START ON 10/29/2019] lamoTRIgine (LAMICTAL) 150 MG tablet Take 1 tablet (150 mg total) by mouth 2 (two) times daily. 60 tablet 2  . predniSONE (DELTASONE) 20 MG tablet Take 1 tablet (20 mg total) by mouth daily with breakfast. 5 tablet 0   No current facility-administered medications for this visit.     Psychiatric Specialty Exam: Review of Systems  Psychiatric/Behavioral: Positive for sleep disturbance. The patient is nervous/anxious.   All other systems reviewed and are negative.   There were no vitals taken for this visit.There is no height or weight on file to calculate BMI.  General Appearance: NA  Eye Contact:  NA  Speech:  Clear and Coherent and Normal Rate  Volume:  Normal  Mood:  Anxious  Affect:  NA  Thought Process:  Goal Directed  Orientation:  Full (Time, Place, and Person)  Thought Content: Logical   Suicidal Thoughts:  No  Homicidal Thoughts:  No  Memory:  Immediate;   Fair Recent;   Fair Remote;   Fair  Judgement:  Good  Insight:  Fair  Psychomotor Activity:  NA  Concentration:  Concentration: Fair  Recall:  [employer] of Knowledge: Fair  Language: Good  Akathisia:  Negative  Handed:  Right  AIMS (if indicated): not done  Assets:  Desire for Improvement Housing Resilience  ADL's:  Intact  Cognition: WNL  Sleep:  Poor   Screenings: GAD-7     Office Visit from 08/03/2019 in [facility] Office Visit from 07/04/2019 in [facility] Office Visit from 02/20/2018 in Primary Care at [facility]  Total GAD-7 Score 20 20 18     PHQ2-9     Office Visit from  08/03/2019 in [facility] Office Visit from 07/04/2019 in [facility] Visit from 02/20/2018 in [facility] Care at [facility] Visit from 05/06/2017 in [facility]  PHQ-2 Total Score 2 4 3 2   PHQ-9 Total Score 11 20 16 12        Assessment and Plan: 24 yo DWFwithbipolar disorder and ADHD(the latter diagnosedwhen she was 55-51 years old). Unfortunately she does not remember medications she has been tried for either of these conditions just says that "there were many". She seems to recall names of Ritalin and Adderall. Available record also shows that she was tried on SSRIs (citalopram, fluoxetine), aripiprazole, oxcarbazepine, hydroxyzine, clonazepam. She describes her mood swings as being very rapid - happening several times during the day. The upswings are described and increased irritability, increased energy, racing thoughts, impulsivity (no euphoria). The longest such an episode can last is less than a day and she denies ever having psychotic symptoms. She also denies having hx of prolonged depressive episodes, suicidal thoughts although she admits OD on meds impulsively out of frustration when she was 13. She did not go to the hospital then and has npo hx of inpatient psychiatric admissions. She tried to self medicate with marijuana but has not used this drug "for a fewyears". She denies abusing other street or prescription drugs. At one time (few years ago) she was also drinking "too much" but she dos not do it any more. Cannabis use disorder is in full remission. WestartedLamictal titration and escitalopram 10 mg daily. Initially she had muscle cramps and some sedation but both have resolved.She also complainedof difficulty with falling asleep - trazodone has not been very effective for that (150 mg)andregularAmbien only helps her fall asleep but she is up after few hours.I have ordered Ambien CRbut  it was not approved by her insurance.For the past few weeks she has been taking temazepam and it initially seemed to be more effective but now she states that it takes few hours for her to fall asleep and she continues to wake up in the middle of the night. We have added Adderall XR 20 mgandwhile she foundit helpful for concentrationis was notyet optimal. We increased dose to 30 mg and she reacted with increased anxiety (the same happened when we tried Concerta 27 mg) but tolerates 25 mg well.She complained of feeling stressed out, anxious often and we have added clonazepam for anxiety - she finds it effective.   Dx: Bipolar 2ultra rapid cycling; GAD; ADHD  Plan: Continueescitalopram to20mg  for anxiety,Lamictal 150 mg bid, Adderall XR  25 mg and clonazepam 0.5 mg bid prn anxiety.We will stop temazepam30 mg and instead try eszopiclone 3 mg insomnia. Next appointment in2 months.The plan was discussed with patient who had an opportunity to ask questions and these were all answered. I spend63minutes inphoneconsultation with the patient.    Stephanie Acre, MD 10/10/2019, 1:16 PM

## 2019-10-11 ENCOUNTER — Encounter: Payer: Self-pay | Admitting: Nurse Practitioner

## 2019-10-11 ENCOUNTER — Telehealth: Payer: Self-pay

## 2019-10-11 NOTE — Telephone Encounter (Signed)
Do you know anything about this? 

## 2019-10-11 NOTE — Telephone Encounter (Signed)
Patient called patient surgery has been denied due to primary care mot completing clarence ,call back [phone]

## 2019-10-16 ENCOUNTER — Ambulatory Visit: Payer: Medicaid Other | Attending: Nurse Practitioner

## 2019-10-16 ENCOUNTER — Ambulatory Visit (INDEPENDENT_AMBULATORY_CARE_PROVIDER_SITE_OTHER): Payer: Medicaid Other

## 2019-10-16 ENCOUNTER — Other Ambulatory Visit: Payer: Self-pay

## 2019-10-16 ENCOUNTER — Ambulatory Visit
Admission: EM | Admit: 2019-10-16 | Discharge: 2019-10-16 | Disposition: A | Discharge status: Home / Self Care | Payer: Medicaid Other | Attending: Emergency Medicine | Admitting: Emergency Medicine

## 2019-10-16 DIAGNOSIS — M79674 Pain in right toe(s): Secondary | ICD-10-CM | POA: Diagnosis not present

## 2019-10-16 DIAGNOSIS — R8271 Bacteriuria: Secondary | ICD-10-CM | POA: Diagnosis not present

## 2019-10-16 DIAGNOSIS — W228XXA Striking against or struck by other objects, initial encounter: Secondary | ICD-10-CM | POA: Diagnosis not present

## 2019-10-16 DIAGNOSIS — Z01818 Encounter for other preprocedural examination: Secondary | ICD-10-CM | POA: Insufficient documentation

## 2019-10-16 DIAGNOSIS — S92534A Nondisplaced fracture of distal phalanx of right lesser toe(s), initial encounter for closed fracture: Secondary | ICD-10-CM | POA: Insufficient documentation

## 2019-10-16 DIAGNOSIS — Z87442 Personal history of urinary calculi: Secondary | ICD-10-CM | POA: Diagnosis not present

## 2019-10-16 DIAGNOSIS — M7989 Other specified soft tissue disorders: Secondary | ICD-10-CM | POA: Diagnosis not present

## 2019-10-16 DIAGNOSIS — Q619 Cystic kidney disease, unspecified: Secondary | ICD-10-CM | POA: Diagnosis not present

## 2019-10-16 IMAGING — DX DG TOE 5TH 2+V*R*
3 series · 3 of 3 positions shown · non-contrast
Comparison: None

CLINICAL DATA: Limited range of motion RIGHT fifth digit pain
following impact with metallic object

EXAM:
RIGHT FIFTH TOE

[hallux dp]
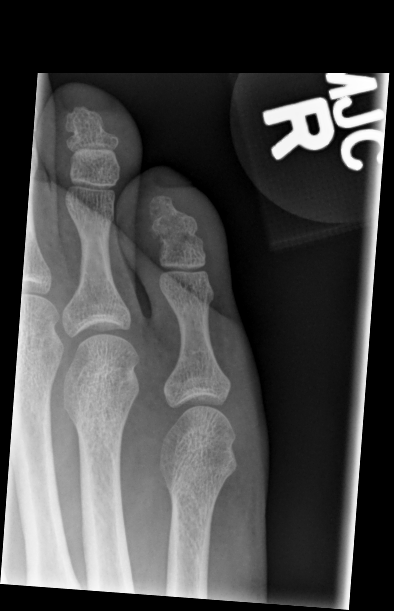

[hallux lat (1 of 2)]
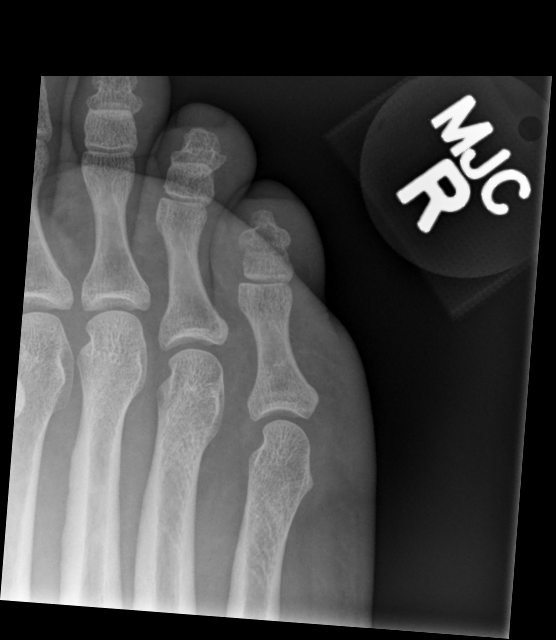

[hallux lat (2 of 2)]
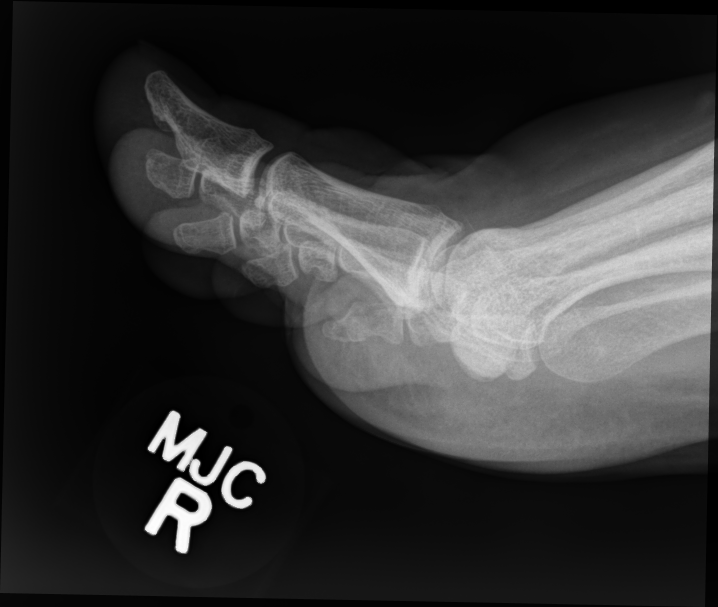

[3 of 3 positions shown; findings below may reference images not displayed]

FINDINGS: Irregularity at the base of the distal phalanx of the RIGHT small
toe, fifth digit with some overlap on the AP view limits assessment
also on the oblique view soft tissue swelling about the toe and
forefoot in general no radiopaque foreign body.

Irregularity of the distal aspect of the middle phalanx as well.
IMPRESSION: Irregularity at the base of the distal phalanx and distal aspect of
the middle phalanx of the fifth digit could be related to prior
injury/fracture. Infection/osteomyelitis could also have a similar
appearance and is associated with soft tissue swelling in the area.
Correlate with history of nail bed injury at the time of initial
injury is this would increase the risk of infection.

## 2019-10-16 MED ORDER — NAPROXEN 500 MG PO TABS: 500.0000 mg | ORAL_TABLET | Freq: Two times a day (BID) | ORAL | 0 refills | Status: DC

## 2019-10-16 NOTE — Discharge Instructions (Signed)
Heat therapy (hot compress, warm wash rag, hot showers, etc.) can help relax muscles and soothe muscle aches. Cold therapy (ice packs) can be used to help swelling both after injury and after prolonged use of areas of chronic pain/aches.  Pain medication:  500 mg Naprosyn/Aleve (naproxen) every 12 hours with food:  AVOID other NSAIDs while taking this (may have Tylenol).  Important to follow up with specialist(s) below for further evaluation/management if your symptoms persist or worsen. 

## 2019-10-16 NOTE — Progress Notes (Signed)
Pt is here for EKG as instructed and ordered by PCP for medical clearance/ Name and DOB verified/ Test well tolerated/  NP Zelda aware of results/ No further instruction given.

## 2019-10-16 NOTE — ED Triage Notes (Signed)
Pt presents with complaints of of right fifth toe pain. Reports that the end of June a metal chair fell on her toe and she has been having pain since. Reports she has been having ongoing pain since. Requesting an xray. Limited range of motion and tenderness present in toe.

## 2019-10-16 NOTE — ED Provider Notes (Signed)
[facility]    CSN: [id number] Arrival date & time: 10/16/19  3474      History   Chief Complaint Chief Complaint  Patient presents with  . Foot Pain    HPI Kellie Martinez is a 24 y.o. female  Presenting for persistent right fifth toe pain.  States at the end of June a metal chair fell on her toe and she been having pain since.  Requesting x-ray.  Was seen in our UC for this: X-ray ordered, though not obtained.  Denies wound, bleeding, discharge, fever, arthralgias, myalgias.  Past Medical History:  Diagnosis Date  . Anemia   . Anxiety   . Asthma   . Bipolar 1 disorder ([place])   . 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  . Chronic kidney disease   . Depression   . GERD (gastroesophageal reflux disease)   . Molar pregnancy   . Renal calculus or stone    had stent/ lithotripsy in past  . Sleep apnea   . Substance abuse ([place])   . Suicide attempt ([place])    x 2    Patient Active Problem List   Diagnosis Date Noted  . GAD (generalized anxiety disorder) 01/03/2019  . Attention deficit hyperactivity disorder (ADHD), combined type 01/03/2019  . Bipolar 2 disorder ([place]) 01/03/2019  . Precordial pain 07/07/2017  . History of renal stone 05/18/2017  . Umbilical hernia 25/95/6387  . 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 Difficulty sleeping 05/06/2017  . Gastroesophageal reflux disease without esophagitis 05/06/2017  . History of drug overdose 05/06/2017    Past Surgical History:  Procedure Laterality Date  . CYSTOSCOPY     x2 with stone extraction  . EXTRACORPOREAL SHOCK WAVE LITHOTRIPSY Right 06/20/2013   Procedure: EXTRACORPOREAL SHOCK WAVE LITHOTRIPSY (ESWL);  Surgeon: Edwin Dada, MD;  Location: [facility];  Service: Urology;  Laterality: Right;  . FOOT SURGERY Left 2003  . LITHOTRIPSY    . TUBAL LIGATION  2017  . TUBAL LIGATION      OB History   No obstetric history on file.      Home Medications    Prior to Admission medications    Medication Sig Start Date End Date Taking? Authorizing Provider  albuterol (VENTOLIN HFA) 108 (90 Base) MCG/ACT inhaler Inhale 1-2 puffs into the lungs every 6 (six) hours as needed for wheezing or shortness of breath. 01/15/19   Wurst, [place], PA-C  amphetamine-dextroamphetamine (ADDERALL XR) 25 MG 24 hr capsule Take 1 capsule by mouth every morning. 09/06/19 10/06/19  Pucilowski, Marchia Bond, MD  amphetamine-dextroamphetamine (ADDERALL XR) 25 MG 24 hr capsule Take 1 capsule by mouth every morning. 10/06/19 11/05/19  Pucilowski, Marchia Bond, MD  amphetamine-dextroamphetamine (ADDERALL XR) 25 MG 24 hr capsule Take 1 capsule by mouth every morning. 11/05/19 12/05/19  Pucilowski, Marchia Bond, MD  clonazePAM (KLONOPIN) 1 MG tablet Take 1 tablet (1 mg total) by mouth 2 (two) times daily as needed for anxiety. 10/10/19 12/09/19  Pucilowski, Marchia Bond, MD  escitalopram (LEXAPRO) 20 MG tablet Take 1 tablet (20 mg total) by mouth daily. 10/29/19 01/27/20  Pucilowski, Marchia Bond, MD  Eszopiclone 3 MG TABS Take 1 tablet (3 mg total) by mouth at bedtime as needed (sleep). Take immediately before bedtime 10/10/19 12/09/19  Pucilowski, Marchia Bond, MD  HYDROcodone-acetaminophen (NORCO/VICODIN) 5-325 MG tablet Take 1 tablet by mouth every 6 (six) hours as needed for moderate pain. 08/14/19   Jessy Oto, MD  lamoTRIgine (LAMICTAL) 150 MG tablet Take  1 tablet (150 mg total) by mouth 2 (two) times daily. 10/29/19 01/27/20  Pucilowski, Marchia Bond, MD  naproxen (NAPROSYN) 500 MG tablet Take 1 tablet (500 mg total) by mouth 2 (two) times daily. 10/16/19   Hall-Potvin, [place], PA-C    Family History Family History  Problem Relation Age of Onset  . Depression Mother   . Asthma Mother   . Hypertension Mother   . Mood Disorder Mother   . Heart disease Father   . Kidney disease Father   . Alcoholism Father   . Breast cancer Paternal Aunt   . Breast cancer Maternal Grandmother   . Breast cancer Cousin   . Alcoholism Brother   .  Drug abuse Brother     Social History Social History   Tobacco Use  . Smoking status: Former Smoker    Packs/day: 0.25    Types: Cigarettes  . Smokeless tobacco: Never Used  Vaping Use  . Vaping Use: Some days  Substance Use Topics  . Alcohol use: Not Currently    Comment: stopped drinking for 3 months.   . Drug use: Not Currently     Allergies   Levofloxacin, Penicillins, and Phenergan [promethazine hcl]   Review of Systems As per HPI   Physical Exam Triage Vital Signs ED Triage Vitals  Enc Vitals Group     BP 10/16/19 1022 104/71     Pulse Rate 10/16/19 1022 76     Resp 10/16/19 1022 18     Temp 10/16/19 1022 (!) 97.5 F (36.4 C)     Temp src --      SpO2 10/16/19 1022 97 %     Weight --      Height --      Head Circumference --      Peak Flow --      Pain Score 10/16/19 1019 10     Pain Loc --      Pain Edu? --      Excl. in [place]? --    No data found.  Updated Vital Signs BP 104/71   Pulse 76   Temp (!) 97.5 F (36.4 C)   Resp 18   LMP 10/13/2019   SpO2 97%   Visual Acuity Right Eye Distance:   Left Eye Distance:   Bilateral Distance:    Right Eye Near:   Left Eye Near:    Bilateral Near:     Physical Exam Constitutional:      General: She is not in acute distress. HENT:     Head: Normocephalic and atraumatic.  Eyes:     General: No scleral icterus.    Pupils: Pupils are equal, round, and reactive to light.  Cardiovascular:     Rate and Rhythm: Normal rate.  Pulmonary:     Effort: Pulmonary effort is normal.  Musculoskeletal:        General: Tenderness present.     Comments: Slightly decreased ROM secondary pain.  Neurovascularly intact.  No open wound or warmth, crepitus.  Skin:    Coloration: Skin is not jaundiced or pale.  Neurological:     Mental Status: She is alert and oriented to person, place, and time.      UC Treatments / Results  Labs (all labs ordered are listed, but only abnormal results are displayed) Labs  Reviewed - No data to display  EKG   Radiology DG Toe 5th Right  Result Date: 10/16/2019 CLINICAL DATA:  Limited range of motion RIGHT fifth digit  pain following impact with metallic object EXAM: RIGHT FIFTH TOE COMPARISON:  None FINDINGS: Irregularity at the base of the distal phalanx of the RIGHT small toe, fifth digit with some overlap on the AP view limits assessment also on the oblique view soft tissue swelling about the toe and forefoot in general no radiopaque foreign body. Irregularity of the distal aspect of the middle phalanx as well. IMPRESSION: Irregularity at the base of the distal phalanx and distal aspect of the middle phalanx of the fifth digit could be related to prior injury/fracture. Infection/osteomyelitis could also have a similar appearance and is associated with soft tissue swelling in the area. Correlate with history of nail bed injury at the time of initial injury is this would increase the risk of infection. Electronically Signed   By: Zetta Bills M.D.   On: 10/16/2019 10:42    Procedures Procedures (including critical care time)  Medications Ordered in UC Medications - No data to display  Initial Impression / Assessment and Plan / UC Course  I have reviewed the triage vital signs and the nursing notes.  Pertinent labs & imaging results that were available during my care of the patient were reviewed by me and considered in my medical decision making (see chart for details).     X-ray with irregularity at base of distal phalanx and distal aspect of middle phalanx of the fifth digit consistent with previous injury/fracture.  Infection/osteomyelitis could also have similar appearance.  Low concern for osteomyelitis at this time given reassuring exam.  Patient given postop boot, contact information for orthopedic follow-up.  Return precautions discussed, pt verbalized understanding and is agreeable to plan. Final Clinical Impressions(s) / UC Diagnoses   Final  diagnoses:  Closed nondisplaced fracture of distal phalanx of lesser toe of right foot, initial encounter     Discharge Instructions     Heat therapy (hot compress, warm wash rag, hot showers, etc.) can help relax muscles and soothe muscle aches. Cold therapy (ice packs) can be used to help swelling both after injury and after prolonged use of areas of chronic pain/aches.  Pain medication:  500 mg Naprosyn/Aleve (naproxen) every 12 hours with food:  AVOID other NSAIDs while taking this (may have Tylenol).  Important to follow up with specialist(s) below for further evaluation/management if your symptoms persist or worsen.    ED Prescriptions    Medication Sig Dispense Auth. Provider   naproxen (NAPROSYN) 500 MG tablet Take 1 tablet (500 mg total) by mouth 2 (two) times daily. 30 tablet Hall-Potvin, [place], PA-C     PDMP not reviewed this encounter.   Hall-Potvin, [place], [place] 10/16/19 1121

## 2019-10-17 ENCOUNTER — Other Ambulatory Visit: Payer: Self-pay | Admitting: Nephrology

## 2019-10-17 DIAGNOSIS — Z87442 Personal history of urinary calculi: Secondary | ICD-10-CM

## 2019-10-17 DIAGNOSIS — Q619 Cystic kidney disease, unspecified: Secondary | ICD-10-CM

## 2019-10-17 LAB — BASIC METABOLIC PANEL
BUN/Creatinine Ratio: 18 (ref 9–23)
BUN: 14 mg/dL (ref 6–20)
CO2: 25 mmol/L (ref 20–29)
Calcium: 10.3 mg/dL — ABNORMAL HIGH (ref 8.7–10.2)
Chloride: 101 mmol/L (ref 96–106)
Creatinine, Ser: 0.79 mg/dL (ref 0.57–1.00)
GFR calc Af Amer: 119 mL/min/{1.73_m2} (ref >59)
GFR calc non Af Amer: 104 mL/min/{1.73_m2} (ref >59)
Glucose: 88 mg/dL (ref 65–99)
Potassium: 4.6 mmol/L (ref 3.5–5.2)
Sodium: 141 mmol/L (ref 134–144)

## 2019-10-17 LAB — CBC
Hematocrit: 40.5 % (ref 34.0–46.6)
Hemoglobin: 13.4 g/dL (ref 11.1–15.9)
MCH: 30.5 pg (ref 26.6–33.0)
MCHC: 33.1 g/dL (ref 31.5–35.7)
MCV: 92 fL (ref 79–97)
Platelets: 192 10*3/uL (ref 150–450)
RBC: 4.4 x10E6/uL (ref 3.77–5.28)
RDW: 13.1 % (ref 11.7–15.4)
WBC: 6.8 10*3/uL (ref 3.4–10.8)

## 2019-10-18 ENCOUNTER — Encounter: Payer: Self-pay | Admitting: Nurse Practitioner

## 2019-10-18 ENCOUNTER — Ambulatory Visit: Payer: Self-pay | Admitting: Dietician

## 2019-10-20 ENCOUNTER — Encounter: Payer: Self-pay | Admitting: Emergency Medicine

## 2019-10-20 ENCOUNTER — Ambulatory Visit
Admission: EM | Admit: 2019-10-20 | Discharge: 2019-10-20 | Disposition: A | Discharge status: Home / Self Care | Payer: Medicaid Other | Attending: Emergency Medicine | Admitting: Emergency Medicine

## 2019-10-20 DIAGNOSIS — R6883 Chills (without fever): Secondary | ICD-10-CM

## 2019-10-20 DIAGNOSIS — Z1152 Encounter for screening for COVID-19: Secondary | ICD-10-CM | POA: Diagnosis not present

## 2019-10-20 DIAGNOSIS — J069 Acute upper respiratory infection, unspecified: Secondary | ICD-10-CM | POA: Diagnosis not present

## 2019-10-20 MED ORDER — CETIRIZINE HCL 10 MG PO TABS: 10.0000 mg | ORAL_TABLET | Freq: Every day | ORAL | 0 refills | Status: DC

## 2019-10-20 MED ORDER — FLUTICASONE PROPIONATE 50 MCG/ACT NA SUSP: 1.0000 | Freq: Every day | NASAL | 0 refills | Status: DC

## 2019-10-20 MED ORDER — ALBUTEROL SULFATE HFA 108 (90 BASE) MCG/ACT IN AERS: 1.0000 | INHALATION_SPRAY | Freq: Four times a day (QID) | RESPIRATORY_TRACT | 1 refills | Status: DC | PRN

## 2019-10-20 MED ORDER — DEXAMETHASONE 4 MG PO TABS: 4.0000 mg | ORAL_TABLET | Freq: Every day | ORAL | 0 refills | Status: AC

## 2019-10-20 MED ORDER — BENZONATATE 100 MG PO CAPS: 100.0000 mg | ORAL_CAPSULE | Freq: Three times a day (TID) | ORAL | 0 refills | Status: DC

## 2019-10-20 NOTE — ED Triage Notes (Addendum)
Sore throat, runny nose, cold chills, RT ear pain and fatigue x 2 days

## 2019-10-20 NOTE — Discharge Instructions (Signed)
COVID testing ordered.  It will take between 2-7 days for test results.  Someone will contact you regarding abnormal results.    In the meantime: You should remain isolated in your home for 10 days from symptom onset AND greater than 24 hours after symptoms resolution (absence of fever without the use of fever-reducing medication and improvement in respiratory symptoms), whichever is longer Get plenty of rest and push fluids Tessalon Perles prescribed for cough Zyrtec for nasal congestion, runny nose, and/or sore throat Flonase for nasal congestion and runny nose Decadron was prescribed ProAir was refilled Use medications daily for symptom relief Use OTC medications like ibuprofen or tylenol as needed fever or pain Call or go to the ED if you have any new or worsening symptoms such as fever, worsening cough, shortness of breath, chest tightness, chest pain, turning blue, changes in mental status, etc..Marland Kitchen

## 2019-10-20 NOTE — ED Provider Notes (Signed)
[place]   [id number] 10/20/19 Arrival Time: 2440   CC: COVID symptoms  SUBJECTIVE: History from: patient.  Kellie Martinez is a 24 y.o. female who presents to the urgent care for complaint of chill, fever, runny nose, sore throat, ear pain and fatigue for the past 2 days.  Denies sick exposure to flu or strep.  Denies recent travel.  Has tried OTC medication without relief.  S denies aggravating factors.  Denies previous symptoms in the past.   Denies fever, chills, fatigue, sinus pain, rhinorrhea, sore throat, SOB, wheezing, chest pain, nausea, changes in bowel or bladder habits.     ROS: As per HPI.  All other pertinent ROS negative.     Past Medical History:  Diagnosis Date  . Anemia   . Anxiety   . Asthma   . Bipolar 1 disorder ([place])   . 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  . Chronic kidney disease   . Depression   . GERD (gastroesophageal reflux disease)   . Molar pregnancy   . Renal calculus or stone    had stent/ lithotripsy in past  . Sleep apnea   . Substance abuse ([place])   . Suicide attempt ([place])    x 2   Past Surgical History:  Procedure Laterality Date  . CYSTOSCOPY     x2 with stone extraction  . EXTRACORPOREAL SHOCK WAVE LITHOTRIPSY Right 06/20/2013   Procedure: EXTRACORPOREAL SHOCK WAVE LITHOTRIPSY (ESWL);  Surgeon: Edwin Dada, MD;  Location: [facility];  Service: Urology;  Laterality: Right;  . FOOT SURGERY Left 2003  . LITHOTRIPSY    . TUBAL LIGATION  2017  . TUBAL LIGATION     Allergies  Allergen Reactions  . Levofloxacin   . Penicillins Hives    No anaphylaxis  Did it involve swelling of the face/tongue/throat, SOB, or low BP? No Did it involve sudden or severe rash/hives, skin peeling, or any reaction on the inside of your mouth or nose? Yes Did you need to seek medical attention at a hospital or doctor's office? Yes When did it last happen?Childhood If all above answers are "NO", may proceed with cephalosporin use.   Marland Kitchen Phenergan  [Promethazine Hcl]     Tremors, restless lef, akathesia 01-26-2018   No current facility-administered medications on file prior to encounter.   Current Outpatient Medications on File Prior to Encounter  Medication Sig Dispense Refill  . albuterol (VENTOLIN HFA) 108 (90 Base) MCG/ACT inhaler Inhale 1-2 puffs into the lungs every 6 (six) hours as needed for wheezing or shortness of breath. 18 g 0  . amphetamine-dextroamphetamine (ADDERALL XR) 25 MG 24 hr capsule Take 1 capsule by mouth every morning. 30 capsule 0  . amphetamine-dextroamphetamine (ADDERALL XR) 25 MG 24 hr capsule Take 1 capsule by mouth every morning. 30 capsule 0  . [START ON 11/05/2019] amphetamine-dextroamphetamine (ADDERALL XR) 25 MG 24 hr capsule Take 1 capsule by mouth every morning. 30 capsule 0  . clonazePAM (KLONOPIN) 1 MG tablet Take 1 tablet (1 mg total) by mouth 2 (two) times daily as needed for anxiety. 60 tablet 1  . [START ON 10/29/2019] escitalopram (LEXAPRO) 20 MG tablet Take 1 tablet (20 mg total) by mouth daily. 30 tablet 2  . Eszopiclone 3 MG TABS Take 1 tablet (3 mg total) by mouth at bedtime as needed (sleep). Take immediately before bedtime 30 tablet 1  . HYDROcodone-acetaminophen (NORCO/VICODIN) 5-325 MG tablet Take 1 tablet by mouth every 6 (six) hours as needed for moderate pain. [place]  tablet 0  . [START ON 10/29/2019] lamoTRIgine (LAMICTAL) 150 MG tablet Take 1 tablet (150 mg total) by mouth 2 (two) times daily. 60 tablet 2  . naproxen (NAPROSYN) 500 MG tablet Take 1 tablet (500 mg total) by mouth 2 (two) times daily. 30 tablet 0   Social History   Socioeconomic History  . Marital status: Significant Other    Spouse name: Not on file  . Number of children: 3  . Years of education: Not on file  . Highest education level: Not on file  Occupational History  . Not on file  Tobacco Use  . Smoking status: Former Smoker    Packs/day: 0.25    Types: Cigarettes  . Smokeless tobacco: Never Used  Vaping Use   . Vaping Use: Some days  Substance and Sexual Activity  . Alcohol use: Not Currently    Comment: stopped drinking for 3 months.   . Drug use: Not Currently  . Sexual activity: Yes    Birth control/protection: Surgical  Other Topics Concern  . Not on file  Social History Narrative  . Not on file   Social Determinants of Health   Financial Resource Strain:   . Difficulty of Paying Living Expenses: Not on file  Food Insecurity:   . Worried About Charity fundraiser in the Last Year: Not on file  . Ran Out of Food in the Last Year: Not on file  Transportation Needs:   . Lack of Transportation (Medical): Not on file  . Lack of Transportation (Non-Medical): Not on file  Physical Activity:   . Days of Exercise per Week: Not on file  . Minutes of Exercise per Session: Not on file  Stress:   . Feeling of Stress : Not on file  Social Connections:   . Frequency of Communication with Friends and Family: Not on file  . Frequency of Social Gatherings with Friends and Family: Not on file  . Attends Religious Services: Not on file  . Active Member of Clubs or Organizations: Not on file  . Attends Archivist Meetings: Not on file  . Marital Status: Not on file  Intimate Partner Violence:   . Fear of Current or Ex-Partner: Not on file  . Emotionally Abused: Not on file  . Physically Abused: Not on file  . Sexually Abused: Not on file   Family History  Problem Relation Age of Onset  . Depression Mother   . Asthma Mother   . Hypertension Mother   . Mood Disorder Mother   . Heart disease Father   . Kidney disease Father   . Alcoholism Father   . Breast cancer Paternal Aunt   . Breast cancer Maternal Grandmother   . Breast cancer Cousin   . Alcoholism Brother   . Drug abuse Brother     OBJECTIVE:  Vitals:   10/20/19 0836 10/20/19 0839 10/20/19 0840  BP: 90/65    Pulse: 83    Resp: 18    Temp: 97.6 F (36.4 C) 97.6 F (36.4 C)   TempSrc: Oral Oral   SpO2: 97%     Weight:   194 lb (88 kg)  Height:   5\' 1"  (1.549 m)     General appearance: alert; appears fatigued, but nontoxic; speaking in full sentences and tolerating own secretions HEENT: NCAT; Ears: EACs clear, TMs pearly gray; Eyes: PERRL.  EOM grossly intact. Sinuses: nontender; Nose: nares patent without rhinorrhea, Throat: oropharynx clear, tonsils non erythematous or enlarged, uvula  midline  Neck: supple without LAD Lungs: unlabored respirations, symmetrical air entry; cough: mild, moderate; no respiratory distress; CTAB Heart: regular rate and rhythm.  Radial pulses 2+ symmetrical bilaterally Skin: warm and dry Psychological: alert and cooperative; normal mood and affect  LABS:  No results found for this or any previous visit (from the past 24 hour(s)).   ASSESSMENT & PLAN:  1. Chills (without fever)   2. URI with cough and congestion   3. Encounter for screening for COVID-19     No orders of the defined types were placed in this encounter.  Discharge Instruictions  COVID testing ordered.  It will take between 2-7 days for test results.  Someone will contact you regarding abnormal results.    In the meantime: You should remain isolated in your home for 10 days from symptom onset AND greater than 24 hours after symptoms resolution (absence of fever without the use of fever-reducing medication and improvement in respiratory symptoms), whichever is longer Get plenty of rest and push fluids Tessalon Perles prescribed for cough Zyrtec for nasal congestion, runny nose, and/or sore throat Flonase for nasal congestion and runny nose Decadron was prescribed ProAir was refilled Use medications daily for symptom relief Use OTC medications like ibuprofen or tylenol as needed fever or pain Call or go to the ED if you have any new or worsening symptoms such as fever, worsening cough, shortness of breath, chest tightness, chest pain, turning blue, changes in mental status, etc...   Reviewed  expectations re: course of current medical issues. Questions answered. Outlined signs and symptoms indicating need for more acute intervention. Patient verbalized understanding. After Visit Summary given.      Note: This document was prepared using Dragon voice recognition software and may include unintentional dictation errors.    Emerson Monte, FNP 10/20/19 [phone]

## 2019-10-22 ENCOUNTER — Encounter: Payer: Self-pay | Admitting: Nurse Practitioner

## 2019-10-22 LAB — NOVEL CORONAVIRUS, NAA: SARS-CoV-2, NAA: NOT DETECTED

## 2019-10-22 LAB — SARS-COV-2, NAA 2 DAY TAT

## 2019-10-23 ENCOUNTER — Encounter: Payer: Self-pay | Admitting: Nurse Practitioner

## 2019-10-23 ENCOUNTER — Encounter: Payer: Self-pay | Admitting: Specialist

## 2019-10-26 ENCOUNTER — Ambulatory Visit
Admission: RE | Admit: 2019-10-26 | Discharge: 2019-10-26 | Disposition: A | Discharge status: Home / Self Care | Payer: Medicaid Other | Source: Ambulatory Visit | Attending: Nephrology | Admitting: Nephrology

## 2019-10-26 DIAGNOSIS — Z87442 Personal history of urinary calculi: Secondary | ICD-10-CM

## 2019-10-26 DIAGNOSIS — Q619 Cystic kidney disease, unspecified: Secondary | ICD-10-CM

## 2019-10-26 DIAGNOSIS — N200 Calculus of kidney: Secondary | ICD-10-CM | POA: Diagnosis not present

## 2019-10-26 DIAGNOSIS — N2889 Other specified disorders of kidney and ureter: Secondary | ICD-10-CM | POA: Diagnosis not present

## 2019-10-26 IMAGING — CT CT ABD-PELV W/O CM
2 of 4 series · 12 of 46 positions shown, 14 images · non-contrast
Comparison: Renal ultrasound [DATE]. Chest CT [DATE].
Report only from abdominopelvic CT [DATE].

CLINICAL DATA: Chronic hematuria. History of tubal ligation. No
acute injury.

EXAM:
CT ABDOMEN AND PELVIS WITHOUT CONTRAST
TECHNIQUE: Multidetector CT imaging of the abdomen and pelvis was performed
following the standard protocol without IV contrast.

[Series 2: renal stone 5.00 br40 s3 axial · axial · 0.60mm/px · z∈[+1364,+1789]mm · 9 of 105 slices shown, 11 images]
[im 10/105  soft-tissue]
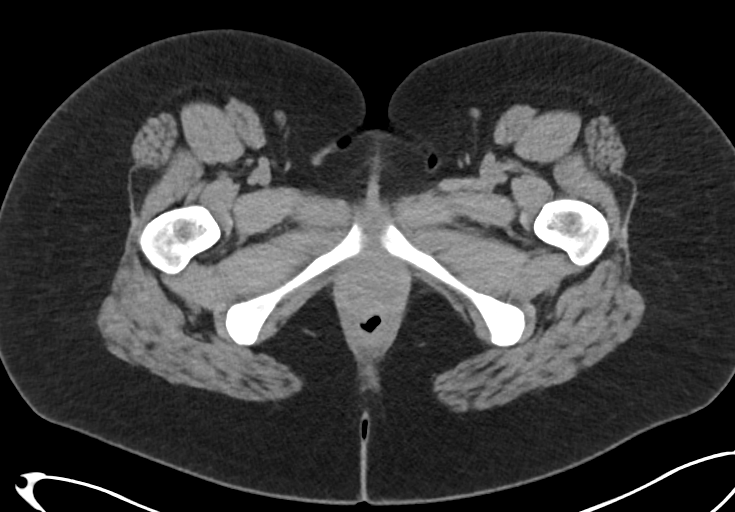
[im 10/105  bone]
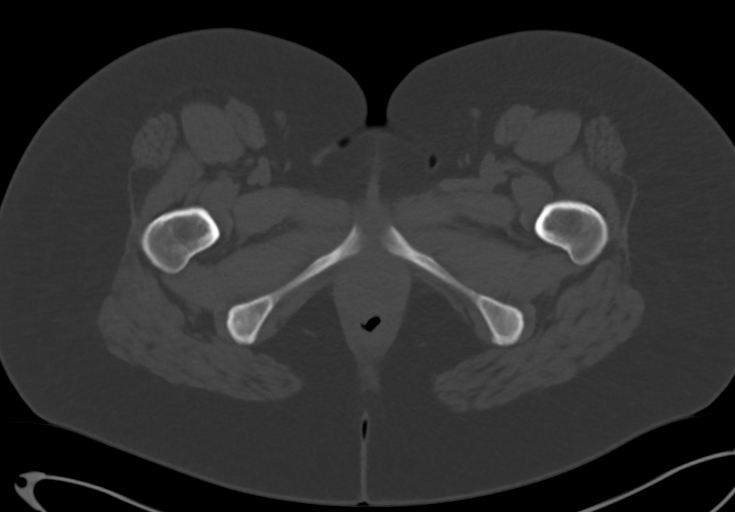
[im 19/105  soft-tissue]
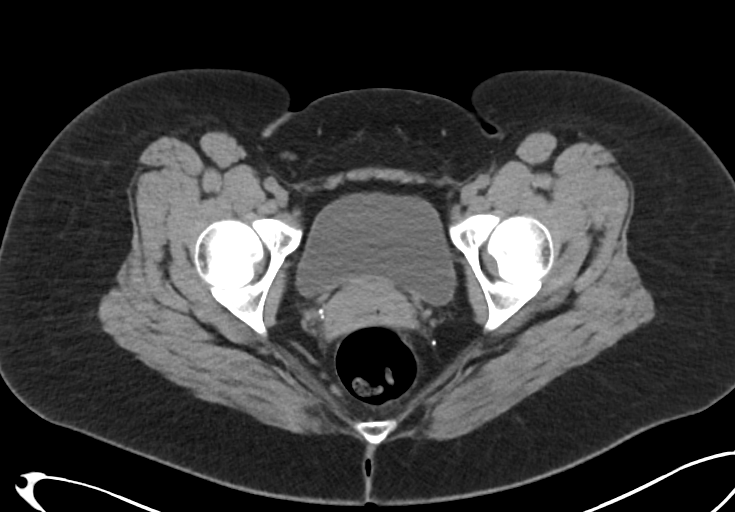
[im 32/105  soft-tissue]
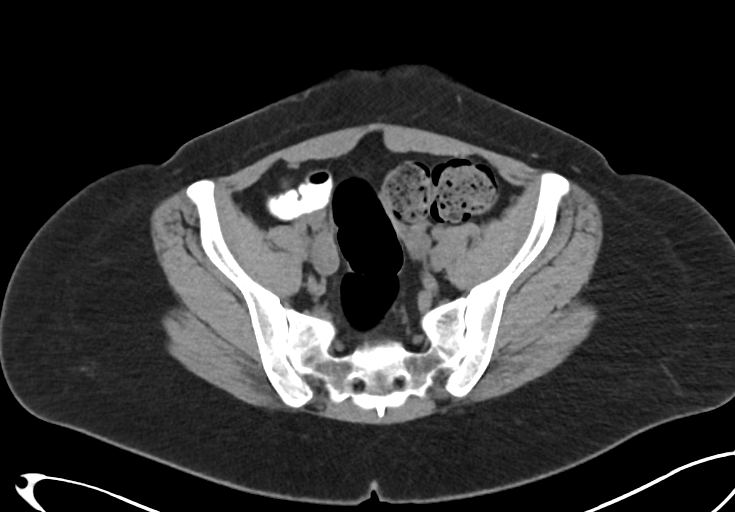
[im 41/105  soft-tissue]
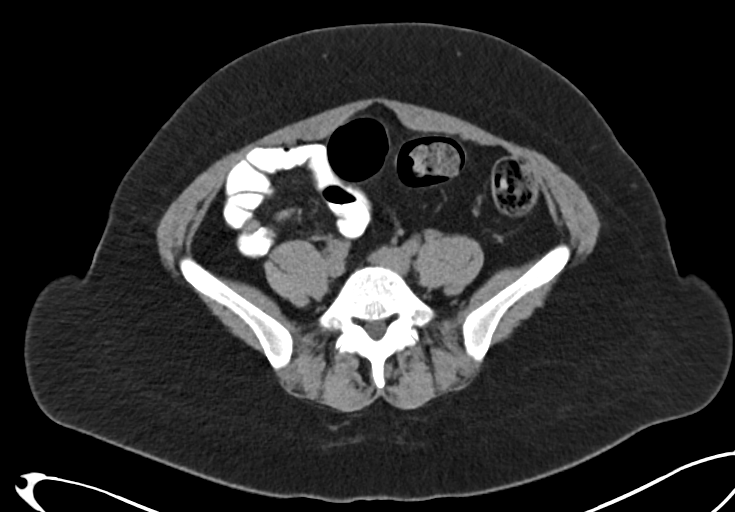
[im 55/105  soft-tissue]
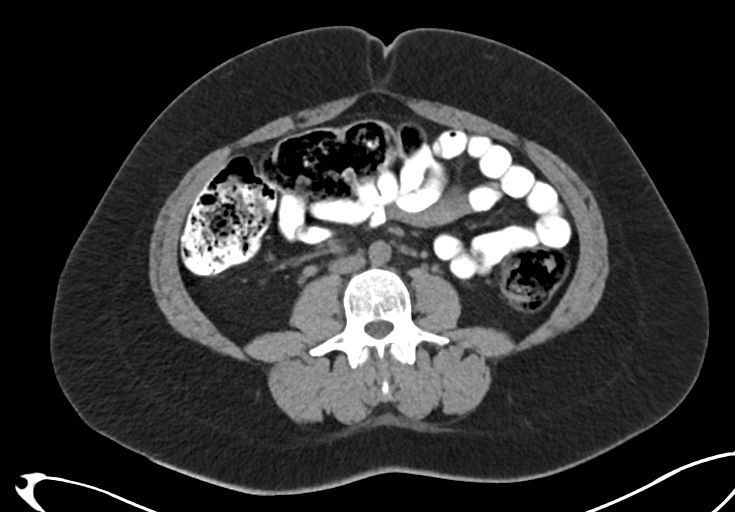
[im 64/105  soft-tissue]
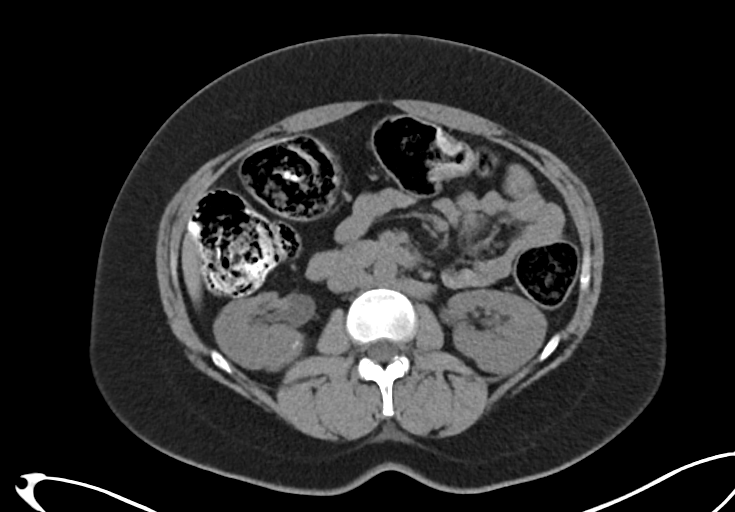
[im 73/105  soft-tissue]
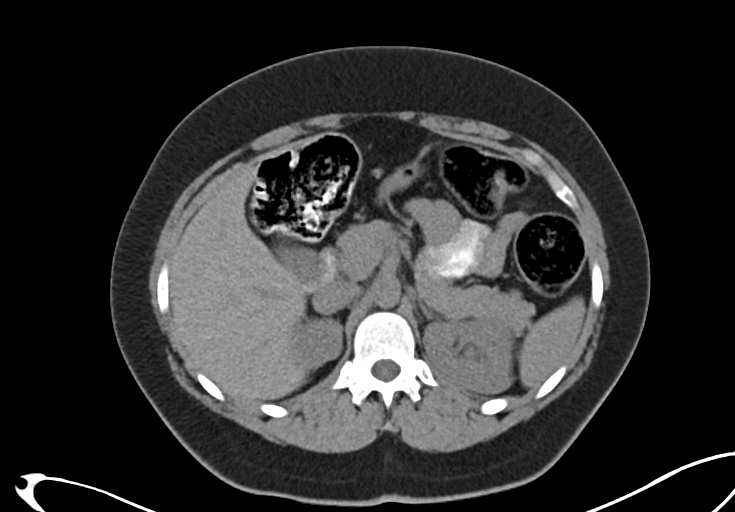
[im 86/105  soft-tissue]
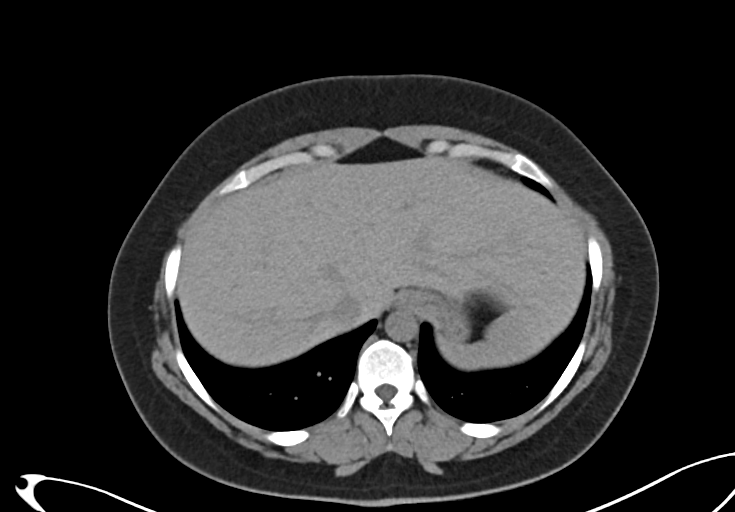
[im 95/105  soft-tissue]
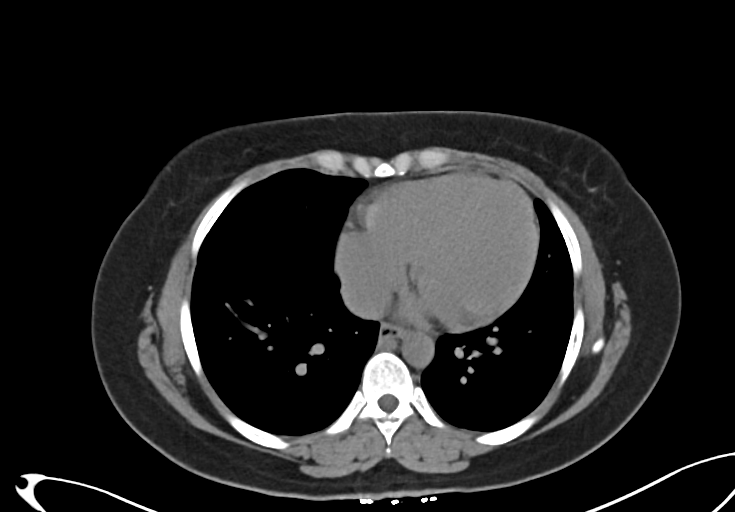
[im 95/105  bone]
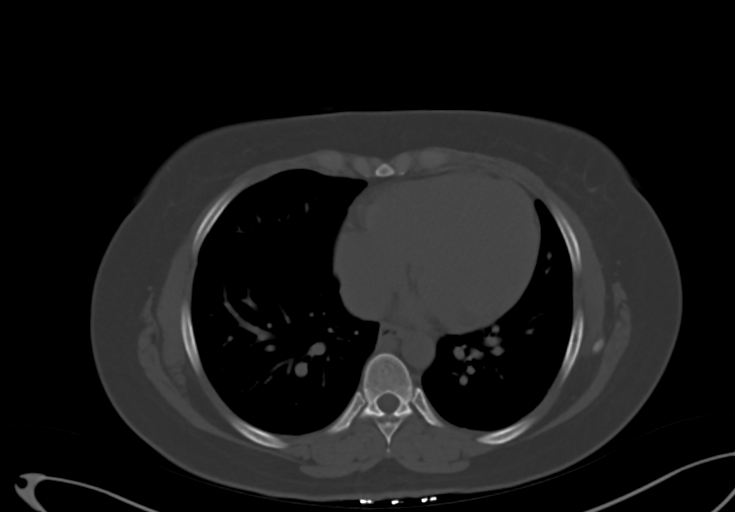

[Series 6: renal stone 2.00 br40 s3 cor · coronal · 0.86mm/px · 3 of 153 slices shown]
[im 51/153  soft-tissue]
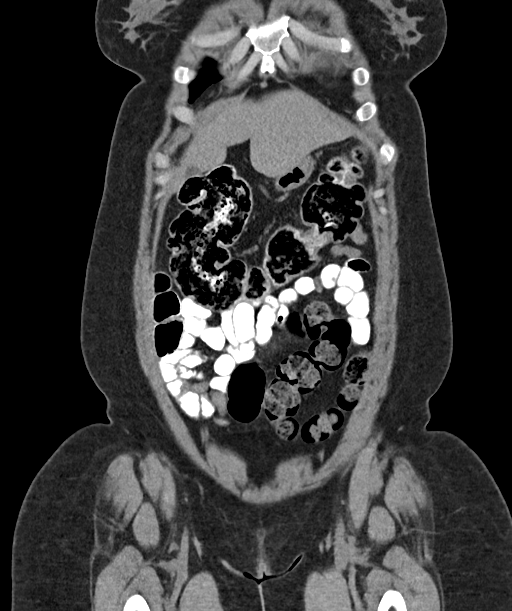
[im 68/153  soft-tissue]
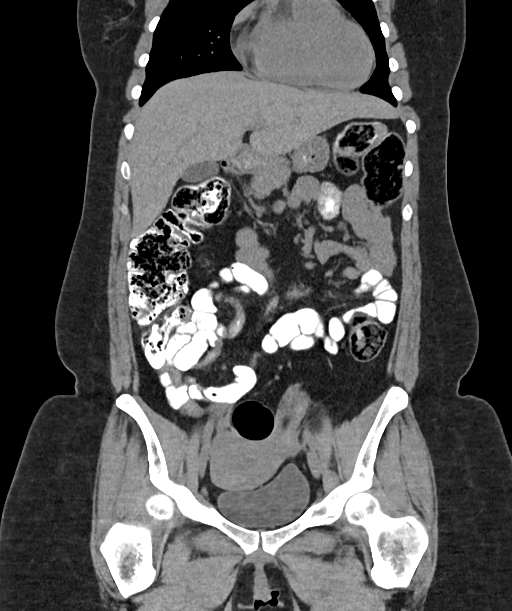
[im 85/153  soft-tissue]
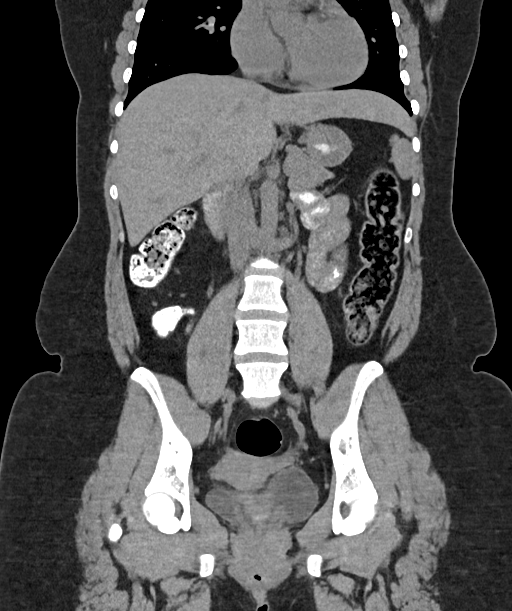

[12 of 46 positions shown; findings below may reference images not displayed]

FINDINGS: Lower chest: Clear lung bases. No significant pleural or pericardial
effusion.

Hepatobiliary: The liver appears unremarkable as imaged in the
noncontrast state. No evidence of gallstones, gallbladder wall
thickening or biliary dilatation.

Pancreas: Unremarkable. No pancreatic ductal dilatation or
surrounding inflammatory changes.

Spleen: Normal in size without focal abnormality.

Adrenals/Urinary Tract: Both adrenal glands appear normal. Bilateral
nephrolithiasis, more numerous on the right. Some of the right renal
calcifications may be parenchymal. There is cortical scarring in the
upper and lower poles of the right kidney. There is no
hydronephrosis, ureteral calculus or perinephric soft tissue
stranding. The bladder appears unremarkable.

Stomach/Bowel: No evidence of bowel wall thickening, distention or
surrounding inflammatory change. The stomach is decompressed and
suboptimally evaluated. The appendix appears normal. Moderate stool
throughout the colon.

Vascular/Lymphatic: There are no enlarged abdominal or pelvic lymph
nodes. Retroaortic left renal vein. No significant vascular findings
on noncontrast imaging.

Reproductive: The uterus and ovaries appear normal. No adnexal mass.

Other: No evidence of abdominal wall mass or hernia. No ascites.

Musculoskeletal: No acute or significant osseous findings.
IMPRESSION: 1. Bilateral nephrolithiasis. No evidence of ureteral calculus or
hydronephrosis.
2. Right renal cortical scarring.
3. No acute abdominopelvic findings.

## 2019-10-28 ENCOUNTER — Other Ambulatory Visit (HOSPITAL_COMMUNITY): Payer: Self-pay | Admitting: Psychiatry

## 2019-10-30 ENCOUNTER — Encounter: Payer: Self-pay | Admitting: Specialist

## 2019-11-02 ENCOUNTER — Other Ambulatory Visit: Payer: Self-pay | Admitting: Emergency Medicine

## 2019-11-07 ENCOUNTER — Encounter: Payer: Self-pay | Admitting: Specialist

## 2019-11-08 ENCOUNTER — Encounter: Payer: Self-pay | Admitting: Surgery

## 2019-11-08 ENCOUNTER — Ambulatory Visit (INDEPENDENT_AMBULATORY_CARE_PROVIDER_SITE_OTHER): Payer: Medicaid Other | Admitting: Surgery

## 2019-11-08 ENCOUNTER — Other Ambulatory Visit: Payer: Self-pay

## 2019-11-08 VITALS — BP 104/71 | HR 93 | Ht 61.0 in | Wt 194.0 lb

## 2019-11-08 DIAGNOSIS — M5010 Cervical disc disorder with radiculopathy, unspecified cervical region: Secondary | ICD-10-CM

## 2019-11-08 NOTE — Progress Notes (Signed)
Office Visit Note   Patient: Kellie Martinez           Date of Birth: [birth year]           MRN: [id number] Visit Date: 11/08/2019              Requested by: Gildardo Pounds, NP [place],  [place] [id number] PCP: Gildardo Pounds, NP   Assessment & Plan: Visit Diagnoses:  1. Herniation of cervical intervertebral disc with radiculopathy     Plan: With patient's ongoing symptoms that have failed conservative treatment with oral NSAIDs, activity modification, home therapy, and cervical ESI recommend proceeding with C5-6 disc arthroplasty (artificial disc replacement).  Surgical procedure has been discussed.  All questions answered.   Follow-Up Instructions: Return for Need return office visit 1 week postop.   Orders:  No orders of the defined types were placed in this encounter.  No orders of the defined types were placed in this encounter.     Procedures: No procedures performed   Clinical Data: No additional findings.   Subjective: Chief Complaint  Patient presents with  . Lower Back - Follow-up  . Pre-op Exam    HPI 24 year old white female with known history of C5-6 HNP comes in for preoperative evaluation.  Patient continues to have ongoing neck pain and describes having bilateral upper extremity radiculopathy.  She has failed conservative treatment previously with oral NSAIDs, Tylenol, home therapy, cervical ESI.  We are awaiting insurance approval for C5-6 disc arthroplasty. Review of Systems   Objective: Vital Signs: BP 104/71   Pulse 93   Ht 5\' 1"  (1.549 m)   Wt 194 lb (88 kg)   LMP 10/13/2019   BMI 36.66 kg/m   Physical Exam Constitutional:      Appearance: Normal appearance.  HENT:     Head: Normocephalic.  Eyes:     Extraocular Movements: Extraocular movements intact.     Pupils: Pupils are equal, round, and reactive to light.  Neck:     Comments: Patient has limited cervical range of motion due to pain and stiffness.  Positive  [place] test.  Marked bilateral brachial plexus and trapezius tenderness. Cardiovascular:     Rate and Rhythm: Regular rhythm.  Pulmonary:     Effort: Pulmonary effort is normal. No respiratory distress.     Breath sounds: Normal breath sounds.  Abdominal:     General: There is no distension.  Musculoskeletal:     Comments: Both shoulders good range of motion.  Patient has trace left biceps weakness.  Skin:    General: Skin is warm and dry.  Neurological:     Mental Status: She is alert and oriented to person, place, and time.     Ortho Exam  Specialty Comments:  No specialty comments available.  Imaging: No results found.   PMFS History: Patient Active Problem List   Diagnosis Date Noted  . GAD (generalized anxiety disorder) 01/03/2019  . Attention deficit hyperactivity disorder (ADHD), combined type 01/03/2019  . Bipolar 2 disorder ([place]) 01/03/2019  . Precordial pain 07/07/2017  . History of renal stone 05/18/2017  . Umbilical hernia 42/70/6237  . 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 Difficulty sleeping 05/06/2017  . Gastroesophageal reflux disease without esophagitis 05/06/2017  . History of drug overdose 05/06/2017   Past Medical History:  Diagnosis Date  . Anemia   . Anxiety   . Asthma   . Bipolar 1 disorder ([place])   .  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  . Chronic kidney disease   . Depression   . GERD (gastroesophageal reflux disease)   . Molar pregnancy   . Renal calculus or stone    had stent/ lithotripsy in past  . Sleep apnea   . Substance abuse ([place])   . Suicide attempt ([place])    x 2    Family History  Problem Relation Age of Onset  . Depression Mother   . Asthma Mother   . Hypertension Mother   . Mood Disorder Mother   . Heart disease Father   . Kidney disease Father   . Alcoholism Father   . Breast cancer Paternal Aunt   . Breast cancer Maternal Grandmother   . Breast cancer Cousin   . Alcoholism Brother   . Drug abuse  Brother     Past Surgical History:  Procedure Laterality Date  . CYSTOSCOPY     x2 with stone extraction  . EXTRACORPOREAL SHOCK WAVE LITHOTRIPSY Right 06/20/2013   Procedure: EXTRACORPOREAL SHOCK WAVE LITHOTRIPSY (ESWL);  Surgeon: Edwin Dada, MD;  Location: [facility];  Service: Urology;  Laterality: Right;  . FOOT SURGERY Left 2003  . LITHOTRIPSY    . TUBAL LIGATION  2017  . TUBAL LIGATION     Social History   Occupational History  . Not on file  Tobacco Use  . Smoking status: Former Smoker    Packs/day: 0.25    Types: Cigarettes  . Smokeless tobacco: Never Used  Vaping Use  . Vaping Use: Some days  Substance and Sexual Activity  . Alcohol use: Not Currently    Comment: stopped drinking for 3 months.   . Drug use: Not Currently  . Sexual activity: Yes    Birth control/protection: Surgical

## 2019-11-09 ENCOUNTER — Ambulatory Visit: Payer: Self-pay | Admitting: Dietician

## 2019-11-12 ENCOUNTER — Encounter: Payer: Self-pay | Admitting: Specialist

## 2019-11-13 ENCOUNTER — Encounter: Payer: Self-pay | Admitting: Nurse Practitioner

## 2019-11-16 ENCOUNTER — Other Ambulatory Visit: Payer: Self-pay

## 2019-11-21 ENCOUNTER — Ambulatory Visit: Payer: Medicaid Other | Admitting: Surgery

## 2019-11-22 ENCOUNTER — Other Ambulatory Visit (HOSPITAL_COMMUNITY)
Admission: RE | Admit: 2019-11-22 | Discharge: 2019-11-22 | Disposition: A | Discharge status: Home / Self Care | Payer: Medicaid Other | Source: Ambulatory Visit | Attending: Specialist | Admitting: Specialist

## 2019-11-22 ENCOUNTER — Other Ambulatory Visit: Payer: Self-pay

## 2019-11-22 ENCOUNTER — Encounter: Payer: Self-pay | Admitting: Surgery

## 2019-11-22 ENCOUNTER — Encounter (HOSPITAL_COMMUNITY): Payer: Self-pay | Admitting: Specialist

## 2019-11-22 ENCOUNTER — Ambulatory Visit (INDEPENDENT_AMBULATORY_CARE_PROVIDER_SITE_OTHER): Payer: Medicaid Other | Admitting: Surgery

## 2019-11-22 VITALS — BP 105/73 | HR 108 | Ht 62.21 in | Wt 201.6 lb

## 2019-11-22 DIAGNOSIS — Z20822 Contact with and (suspected) exposure to covid-19: Secondary | ICD-10-CM | POA: Diagnosis not present

## 2019-11-22 DIAGNOSIS — M5010 Cervical disc disorder with radiculopathy, unspecified cervical region: Secondary | ICD-10-CM

## 2019-11-22 DIAGNOSIS — Z01812 Encounter for preprocedural laboratory examination: Secondary | ICD-10-CM | POA: Insufficient documentation

## 2019-11-22 LAB — SARS CORONAVIRUS 2 (TAT 6-24 HRS): SARS Coronavirus 2: NEGATIVE

## 2019-11-22 NOTE — Progress Notes (Addendum)
Patient denies shortness of breath, fever, cough or chest pain.  PCP - [name], NP Cardiologist - n/a Psychiatry - Dr Montel Culver  Chest x-ray - n/a EKG - 11/16/19 Stress Test - n/a ECHO - 03/04/17 Cardiac Cath - n/a  Sleep Apnea - Yes CPAP - does not use cpap  ERAS: Clears til 4:30 am DOS, no drink.  Anesthesia review: Yes  STOP now taking any Aspirin (unless otherwise instructed by your surgeon), Aleve, Naproxen, Ibuprofen, Motrin, Advil, Goody's, BC's, all herbal medications, fish oil, and all vitamins.   Coronavirus Screening Covid test on 11/21/19 was negative.  Patient verbalized understanding of instructions that were given via phone.

## 2019-11-23 ENCOUNTER — Telehealth (INDEPENDENT_AMBULATORY_CARE_PROVIDER_SITE_OTHER): Payer: Medicaid Other | Admitting: Psychiatry

## 2019-11-23 DIAGNOSIS — F411 Generalized anxiety disorder: Secondary | ICD-10-CM | POA: Diagnosis not present

## 2019-11-23 DIAGNOSIS — F902 Attention-deficit hyperactivity disorder, combined type: Secondary | ICD-10-CM | POA: Diagnosis not present

## 2019-11-23 DIAGNOSIS — F3181 Bipolar II disorder: Secondary | ICD-10-CM

## 2019-11-23 MED ORDER — QUETIAPINE FUMARATE 50 MG PO TABS: 50.0000 mg | ORAL_TABLET | Freq: Every day | ORAL | 1 refills | Status: DC

## 2019-11-23 MED ORDER — AMPHETAMINE-DEXTROAMPHET ER 25 MG PO CP24: 25.0000 mg | ORAL_CAPSULE | ORAL | 0 refills | Status: DC

## 2019-11-23 MED ORDER — CLONAZEPAM 1 MG PO TABS: 1.0000 mg | ORAL_TABLET | Freq: Two times a day (BID) | ORAL | 1 refills | Status: DC | PRN

## 2019-11-23 MED ORDER — ESCITALOPRAM OXALATE 20 MG PO TABS: 30.0000 mg | ORAL_TABLET | Freq: Every day | ORAL | 2 refills | Status: DC

## 2019-11-23 NOTE — Progress Notes (Signed)
Anesthesia Chart Review: SAME DAY WORK-UP   Case: [id number] Date/Time: 11/26/19 0715   Procedure: CERVICAL DISC ARTHROPLASTY C5-6 WITH PRO DISC (N/A )   Anesthesia type: General   Pre-op diagnosis: cervical disc herniation bilateral C5-6 with cord compression, mild degenerative disc disease C4-5, C6-7   Location: [facility] OR ROOM 05 / [facility]   Surgeons: Jessy Oto, MD      DISCUSSION: Patient is a 24 year old female scheduled for the above procedure.   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   11/22/19 presurgical COVID-19 test negative.  Anesthesia team to evaluate on the day of surgery.  She will also need labs and urine pregnancy test.   VS: Ht 5\' 2"  (1.575 m)   Wt 91.4 kg   LMP 11/12/2019 (Approximate)   BMI 36.87 kg/m   BP Readings from Last 3 Encounters:  11/22/19 105/73  11/08/19 104/71  10/20/19 90/65   Pulse Readings from Last 3 Encounters:  11/22/19 (!) 108  11/08/19 93  10/20/19 83    PROVIDERS: Gildardo Pounds, NP is PCP  Maudry Mayhew, MD is psychiatrist  LABS: For day of surgery. As of 10/16/19, Cr 0.79, CBC WNL.    IMAGES: CT abd/pelvis 10/26/19: IMPRESSION: 1. Bilateral nephrolithiasis. No evidence of ureteral calculus or hydronephrosis. 2. Right renal cortical scarring. 3. No acute abdominopelvic findings.  CT C-spine 07/28/19 [facility]): IMPRESSION: No acute fracture or subluxation of the cervical spine. Unchanged  disc extrusions at C4-5 and C5-6.   CT Chest 08/01/17: IMPRESSION: Normal chest CT. The previously questioned soft tissue thickening within the anterior mediastinum has a benign appearance on today's exam, consistent with a combination of benign pericardial recess and normal residual thymic tissue.   EKG: 9/721: NSR   CV: Echo  03/04/17: Study Conclusions  - Left ventricle: The cavity size was normal. Wall thickness was  normal. Systolic function was normal. The estimated ejection  fraction was in the range of 50% to 55%. Wall motion was normal;  there were no regional wall motion abnormalities. Left  ventricular diastolic function parameters were normal.  - Aortic valve: Valve area (VTI): 2.14 cm^2. Valve area (Vmax):  1.92 cm^2. Valve area (Vmean): 2.15 cm^2.  - Technically adequate study.    Past Medical History:  Diagnosis Date  . Anemia   . Anxiety   . Arthritis    lower back  . Asthma   . Bipolar 1 disorder ([place])   . 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  . Chronic kidney disease   . Depression   . GERD (gastroesophageal reflux disease)   . Headache    otc med prn  . History of kidney stones   . HLD (hyperlipidemia)    diet controlled  . Molar pregnancy   . Pneumonia    x 1 yrs ago per patient 11/22/19  . Renal calculus or stone    had stent/ lithotripsy in past  . Sleep apnea    does not use cpap  . Substance abuse ([place])   . Suicide attempt ([place])    x 2    Past Surgical History:  Procedure Laterality Date  . CYSTOSCOPY     x2 with stone extraction  . EXTRACORPOREAL SHOCK WAVE LITHOTRIPSY Right 06/20/2013   Procedure: EXTRACORPOREAL SHOCK WAVE LITHOTRIPSY (ESWL);  Surgeon: Edwin Dada, MD;  Location: [facility];  Service: Urology;  Laterality: Right;  . FOOT SURGERY Left 2003  . LITHOTRIPSY    . TUBAL LIGATION  2017  . TUBAL LIGATION      MEDICATIONS: No current facility-administered medications for this encounter.   Marland Kitchen albuterol (VENTOLIN HFA) 108 (90 Base) MCG/ACT inhaler  . amphetamine-dextroamphetamine (ADDERALL XR) 25 MG 24 hr capsule  . cetirizine (ZYRTEC ALLERGY) 10 MG tablet  . fluticasone (FLONASE) 50 MCG/ACT nasal spray  . lamoTRIgine (LAMICTAL) 150 MG tablet  . naproxen (NAPROSYN) 500 MG tablet  . [START ON 01/05/2020] amphetamine-dextroamphetamine (ADDERALL XR) 25 MG 24  hr capsule  . [START ON 12/05/2019] amphetamine-dextroamphetamine (ADDERALL XR) 25 MG 24 hr capsule  . benzonatate (TESSALON) 100 MG capsule  . [START ON 12/09/2019] clonazePAM (KLONOPIN) 1 MG tablet  . escitalopram (LEXAPRO) 20 MG tablet  . HYDROcodone-acetaminophen (NORCO/VICODIN) 5-325 MG tablet  . QUEtiapine (SEROQUEL) 50 MG tablet    Myra Gianotti, PA-C Surgical Short Stay/Anesthesiology [facility] Phone [phone] [facility] Phone [phone] 11/23/2019 1:28 PM

## 2019-11-23 NOTE — Anesthesia Preprocedure Evaluation (Addendum)
Anesthesia Evaluation  Patient identified by MRN, date of birth, ID band Patient awake    Reviewed: Allergy & Precautions, NPO status , Patient's Chart, lab work & pertinent test results  Airway Mallampati: II  TM Distance: >3 FB Neck ROM: Limited    Dental  (+) Dental Advisory Given, Teeth Intact   Pulmonary Current Smoker and Patient abstained from smoking.,    breath sounds clear to auscultation       Cardiovascular  Rhythm:Regular Rate:Normal     Neuro/Psych    GI/Hepatic   Endo/Other    Renal/GU      Musculoskeletal   Abdominal   Peds  Hematology   Anesthesia Other Findings   Reproductive/Obstetrics                            Anesthesia Physical Anesthesia Plan  ASA: II  Anesthesia Plan: General   Post-op Pain Management:    Induction: Intravenous  PONV Risk Score and Plan: Ondansetron and Dexamethasone  Airway Management Planned: Oral ETT  Additional Equipment:   Intra-op Plan:   Post-operative Plan: Extubation in OR  Informed Consent: I have reviewed the patients History and Physical, chart, labs and discussed the procedure including the risks, benefits and alternatives for the proposed anesthesia with the patient or authorized representative who has indicated his/her understanding and acceptance.     Dental advisory given  Plan Discussed with: CRNA and Anesthesiologist  Anesthesia Plan Comments: (PAT note written 11/23/2019 by Myra Gianotti, PA-C. )       Anesthesia Quick Evaluation

## 2019-11-23 NOTE — Progress Notes (Signed)
24 year old white female history of C5-6 HNP comes in for preop evaluation.  She continues have ongoing neck pain and upper extremity radiculopathy.  Failed conservative treatment.  She is wanting to proceed with C5-6 disc arthroplasty as scheduled.  Today history and physical performed.  Review of systems negative.  Surgical procedure discussed along with potential hospital stay.  All questions answered.

## 2019-11-23 NOTE — H&P (Signed)
Kellie Martinez is an 24 y.o. female.   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  States that symptoms unchanged from previous visit.  She is wanting to proceed with C5-6 disc arthroplasty as scheduled.  Past Medical History:  Diagnosis Date  . Anemia   . Anxiety   . Arthritis    lower back  . Asthma   . Bipolar 1 disorder ([place])   . 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  . Chronic kidney disease   . Depression   . GERD (gastroesophageal reflux disease)   . Headache    otc med prn  . History of kidney stones   . HLD (hyperlipidemia)    diet controlled  . Molar pregnancy   . Pneumonia    x 1 yrs ago per patient 11/22/19  . Renal calculus or stone    had stent/ lithotripsy in past  . Sleep apnea    does not use cpap  . Substance abuse ([place])   . Suicide attempt ([place])    x 2    Past Surgical History:  Procedure Laterality Date  . CYSTOSCOPY     x2 with stone extraction  . EXTRACORPOREAL SHOCK WAVE LITHOTRIPSY Right 06/20/2013   Procedure: EXTRACORPOREAL SHOCK WAVE LITHOTRIPSY (ESWL);  Surgeon: Edwin Dada, MD;  Location: [facility];  Service: Urology;  Laterality: Right;  . FOOT SURGERY Left 2003  . LITHOTRIPSY    . TUBAL LIGATION  2017  . TUBAL LIGATION      Family History  Problem Relation Age of Onset  . Depression Mother   . Asthma Mother   . Hypertension Mother   . Mood Disorder Mother   . Heart disease Father   . Kidney disease Father   . Alcoholism Father   . Breast cancer Paternal Aunt   . Breast cancer Maternal Grandmother   . Breast cancer Cousin   . Alcoholism Brother   . Drug abuse Brother    Social History:  reports that she has been smoking cigarettes. She has been smoking about 0.25 packs per day. She has never used smokeless tobacco. She reports current alcohol use. She reports previous drug use. Drug: Marijuana.  Allergies:   Allergies  Allergen Reactions  . Levofloxacin   . Penicillins Hives    No anaphylaxis  Did it involve swelling of the face/tongue/throat, SOB, or low BP? No Did it involve sudden or severe rash/hives, skin peeling, or any reaction on the inside of your mouth or nose? Yes Did you need to seek medical attention at a hospital or doctor's office? Yes When did it last happen?Childhood If all above answers are "NO", may proceed with cephalosporin use.   Marland Kitchen Phenergan [Promethazine Hcl]     Tremors, restless lef, akathesia 01-26-2018    No medications prior to admission.    Results for orders placed or performed during the hospital encounter of 11/22/19 (from the past 48 hour(s))  SARS CORONAVIRUS 2 (TAT 6-24 HRS) Nasopharyngeal Nasopharyngeal Swab     Status: None   Collection Time: 11/22/19  9:53 AM   Specimen: Nasopharyngeal Swab  Result Value Ref Range   SARS Coronavirus 2 NEGATIVE NEGATIVE    Comment: (NOTE) SARS-CoV-2 target nucleic acids are NOT DETECTED.  The SARS-CoV-2 RNA is generally detectable in upper and lower respiratory specimens during the acute phase of infection. Negative results do not preclude SARS-CoV-2 infection, do not rule  out co-infections with other pathogens, and should not be used as the sole basis for treatment or other patient management decisions. Negative results must be combined with clinical observations, patient history, and epidemiological information. The expected result is Negative.  Fact Sheet for Patients: SugarRoll.be  Fact Sheet for Healthcare Providers: https://www.woods-mathews.com/  This test is not yet approved or cleared by the [place] FDA and  has been authorized for detection and/or diagnosis of SARS-CoV-2 by FDA under an Emergency Use Authorization (EUA). This EUA will remain  in effect (meaning this test can be used) for the duration of the COVID-19 declaration under Se  ction 564(b)(1) of the Act, 21 U.S.C. section 360bbb-3(b)(1), unless the authorization is terminated or revoked sooner.  Performed at [facility], [place] [address], [place], [place] [address]    No results found.  Review of Systems  Constitutional: Positive for activity change.  HENT: Negative.   Respiratory: Negative.   Cardiovascular: Negative.   Gastrointestinal: Negative.   Genitourinary: Negative.   Musculoskeletal: Positive for neck pain and neck stiffness.  Neurological: Positive for numbness.    Height 5\' 2"  (1.575 m), weight 91.4 kg, last menstrual period 11/12/2019. Physical Exam HENT:     Head: Normocephalic.     Mouth/Throat:     Mouth: Mucous membranes are moist.  Eyes:     Extraocular Movements: Extraocular movements intact.     Pupils: Pupils are equal, round, and reactive to light.  Cardiovascular:     Rate and Rhythm: Regular rhythm.  Pulmonary:     Effort: No respiratory distress.     Breath sounds: Normal breath sounds.  Abdominal:     General: Bowel sounds are normal.  Musculoskeletal:        General: Tenderness present.  Neurological:     General: No focal deficit present.     Mental Status: She is alert and oriented to person, place, and time.      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  We will proceed with C5-6 disc arthroplasty as scheduled.  Surgical procedure discussed along with potential hospital stay.  All questions answered and she wishes to proceed.  Benjiman Core, PA-C 11/23/2019, 4:18 PM

## 2019-11-23 NOTE — Progress Notes (Signed)
BH MD/PA/NP OP Progress Note  11/23/2019 9:44 AM Kellie Martinez  MRN:  [id number] Interview was conducted by phone and I verified that I was speaking with the correct person using two identifiers. I discussed the limitations of evaluation and management by telemedicine and  the availability of in person appointments. Patient expressed understanding and agreed to proceed. Patient location - home; physician - home office.  Chief Complaint: Anxiety, insomnia.  HPI: 24yo DWFwithbipolar disorder and ADHD(the latter diagnosedwhen she was 8-47 years old). Unfortunately she does not remember medications she has been tried for either of these conditions just says that "there were many". She seems to recall names of Ritalin and Adderall. Available record also shows that she was tried on SSRIs (citalopram, fluoxetine), aripiprazole, oxcarbazepine, hydroxyzine, clonazepam. She describes her mood swings as being very rapid - happening several times during the day. The upswings are described and increased irritability, increased energy, racing thoughts, impulsivity (no euphoria). The longest such an episode can last is less than a day and she denies ever having psychotic symptoms. She also denies having hx of prolonged depressive episodes, suicidal thoughts although she admits OD on meds impulsively out of frustration when she was 13. She did not go to the hospital then and has npo hx of inpatient psychiatric admissions. She tried to self medicate with marijuana but has not used this drug "for a fewyears". She denies abusing other street or prescription drugs. At one time (few years ago) she was also drinking "too much" but she dos not do it any more. Cannabis use disorder is in full remission. WestartedLamictal titration and escitalopram 10 mg daily. Initially she had muscle cramps and some sedation but both have resolved.She also complainedof difficulty with falling asleep - trazodone has not been very  effective for that (150 mg)andregularAmbien only helps her fall asleep but she is up after few hours.I have ordered Ambien CRbut it was not approved by her insurance.For the past few weeks she has been taking temazepam and it initially seemed to be more effective but now she states that it takes few hours for her to fall asleep and she continues to wake up in the middle of the night. We have added Adderall XR 20 mgandwhile she foundit helpful for concentrationis was notyet optimal. We increased dose to 30 mg and she reacted with increased anxiety(thesame happened when we tried Concerta 27 mg) but tolerates 25 mg well.She complainedof feeling stressed out, anxious often and we have added clonazepam for anxiety - she finds it effective.Sleep has not improved on eszopiclone though. She is anxious about neck surgery she will have in three days.   Visit Diagnosis:    ICD-10-CM   1. Bipolar 2 disorder (HCC)  F31.81   2. Attention deficit hyperactivity disorder (ADHD), combined type  F90.2   3. GAD (generalized anxiety disorder)  F41.1     Past Psychiatric History: Please see intake H&P.  Past Medical History:  Past Medical History:  Diagnosis Date  . Anemia   . Anxiety   . Arthritis    lower back  . Asthma   . Bipolar 1 disorder ([place])   . 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  . Chronic kidney disease   . Depression   . GERD (gastroesophageal reflux disease)   . Headache    otc med prn  . History of kidney stones   . HLD (hyperlipidemia)    diet controlled  . Molar pregnancy   . Pneumonia    x 1 yrs  ago per patient 11/22/19  . Renal calculus or stone    had stent/ lithotripsy in past  . Sleep apnea    does not use cpap  . Substance abuse ([place])   . Suicide attempt ([place])    x 2    Past Surgical History:  Procedure Laterality Date  . CYSTOSCOPY     x2 with stone extraction  . EXTRACORPOREAL SHOCK WAVE LITHOTRIPSY Right 06/20/2013   Procedure: EXTRACORPOREAL SHOCK WAVE LITHOTRIPSY  (ESWL);  Surgeon: Edwin Dada, MD;  Location: [facility];  Service: Urology;  Laterality: Right;  . FOOT SURGERY Left 2003  . LITHOTRIPSY    . TUBAL LIGATION  2017  . TUBAL LIGATION      Family Psychiatric History: Reviewed.  Family History:  Family History  Problem Relation Age of Onset  . Depression Mother   . Asthma Mother   . Hypertension Mother   . Mood Disorder Mother   . Heart disease Father   . Kidney disease Father   . Alcoholism Father   . Breast cancer Paternal Aunt   . Breast cancer Maternal Grandmother   . Breast cancer Cousin   . Alcoholism Brother   . Drug abuse Brother     Social History:  Social History   Socioeconomic History  . Marital status: Single    Spouse name: Not on file  . Number of children: 3  . Years of education: Not on file  . Highest education level: Not on file  Occupational History  . Not on file  Tobacco Use  . Smoking status: Current Every Day Smoker    Packs/day: 0.25    Types: Cigarettes  . Smokeless tobacco: Never Used  . Tobacco comment: 3 cig/day  Vaping Use  . Vaping Use: Former  . Quit date: 02/01/2019  Substance and Sexual Activity  . Alcohol use: Yes    Comment:  weekends wine/liquor  . Drug use: Not Currently    Types: Marijuana    Comment: Last use - 11/17/19  . Sexual activity: Yes    Birth control/protection: Surgical    Comment: Tubal  Other Topics Concern  . Not on file  Social History Narrative  . Not on file   Social Determinants of Health   Financial Resource Strain:   . Difficulty of Paying Living Expenses: Not on file  Food Insecurity:   . Worried About Charity fundraiser in the Last Year: Not on file  . Ran Out of Food in the Last Year: Not on file  Transportation Needs:   . Lack of Transportation (Medical): Not on file  . Lack of Transportation (Non-Medical): Not on file  Physical Activity:   . Days of Exercise per Week: Not on file  . Minutes of Exercise per Session: Not on file   Stress:   . Feeling of Stress : Not on file  Social Connections:   . Frequency of Communication with Friends and Family: Not on file  . Frequency of Social Gatherings with Friends and Family: Not on file  . Attends Religious Services: Not on file  . Active Member of Clubs or Organizations: Not on file  . Attends Archivist Meetings: Not on file  . Marital Status: Not on file    Allergies:  Allergies  Allergen Reactions  . Levofloxacin   . Penicillins Hives    No anaphylaxis  Did it involve swelling of the face/tongue/throat, SOB, or low BP? No Did it involve sudden or severe rash/hives,  skin peeling, or any reaction on the inside of your mouth or nose? Yes Did you need to seek medical attention at a hospital or doctor's office? Yes When did it last happen?Childhood If all above answers are "NO", may proceed with cephalosporin use.   Marland Kitchen Phenergan [Promethazine Hcl]     Tremors, restless lef, akathesia 39-04-90    Metabolic Disorder Labs: Lab Results  Component Value Date   HGBA1C 5.1 07/04/2019   No results found for: PROLACTIN Lab Results  Component Value Date   CHOL 108 03/13/2018   TRIG 45 03/13/2018   HDL 35 (L) 03/13/2018   CHOLHDL 3.1 03/13/2018   [place] 64 03/13/2018   Lab Results  Component Value Date   TSH 1.170 03/13/2018    Therapeutic Level Labs: No results found for: LITHIUM No results found for: VALPROATE No components found for:  CBMZ  Current Medications: Current Outpatient Medications  Medication Sig Dispense Refill  . albuterol (VENTOLIN HFA) 108 (90 Base) MCG/ACT inhaler Inhale 1-2 puffs into the lungs every 6 (six) hours as needed for wheezing or shortness of breath. 18 g 1  . amphetamine-dextroamphetamine (ADDERALL XR) 25 MG 24 hr capsule Take 1 capsule by mouth every morning. 30 capsule 0  . [START ON 01/05/2020] amphetamine-dextroamphetamine (ADDERALL XR) 25 MG 24 hr capsule Take 1 capsule by mouth every morning. 30  capsule 0  . [START ON 12/05/2019] amphetamine-dextroamphetamine (ADDERALL XR) 25 MG 24 hr capsule Take 1 capsule by mouth every morning. 30 capsule 0  . benzonatate (TESSALON) 100 MG capsule Take 1 capsule (100 mg total) by mouth every 8 (eight) hours. 30 capsule 0  . cetirizine (ZYRTEC ALLERGY) 10 MG tablet Take 1 tablet (10 mg total) by mouth daily. 30 tablet 0  . [START ON 12/09/2019] clonazePAM (KLONOPIN) 1 MG tablet Take 1 tablet (1 mg total) by mouth 2 (two) times daily as needed for anxiety. 60 tablet 1  . escitalopram (LEXAPRO) 20 MG tablet Take 1.5 tablets (30 mg total) by mouth daily. 45 tablet 2  . fluticasone (FLONASE) 50 MCG/ACT nasal spray Place 1 spray into both nostrils daily for 14 days. (Patient taking differently: Place 1 spray into both nostrils daily as needed (allergies.). ) 16 g 0  . HYDROcodone-acetaminophen (NORCO/VICODIN) 5-325 MG tablet Take 1 tablet by mouth every 6 (six) hours as needed for moderate pain. 30 tablet 0  . lamoTRIgine (LAMICTAL) 150 MG tablet Take 1 tablet (150 mg total) by mouth 2 (two) times daily. 60 tablet 2  . naproxen (NAPROSYN) 500 MG tablet Take 1 tablet (500 mg total) by mouth 2 (two) times daily. 30 tablet 0  . QUEtiapine (SEROQUEL) 50 MG tablet Take 1 tablet (50 mg total) by mouth at bedtime. 30 tablet 1   No current facility-administered medications for this visit.     Psychiatric Specialty Exam: Review of Systems  Musculoskeletal: Positive for neck pain.  Psychiatric/Behavioral: Positive for sleep disturbance. The patient is nervous/anxious.   All other systems reviewed and are negative.   Last menstrual period 11/12/2019.There is no height or weight on file to calculate BMI.  General Appearance: NA  Eye Contact:  NA  Speech:  Clear and Coherent and Normal Rate  Volume:  Normal  Mood:  Anxious  Affect:  NA  Thought Process:  Descriptions of Associations: Circumstantial  Orientation:  Full (Time, Place, and Person)  Thought  Content: Rumination   Suicidal Thoughts:  No  Homicidal Thoughts:  No  Memory:  Immediate;  Good Recent;   Good Remote;   Good  Judgement:  Good  Insight:  Fair  Psychomotor Activity:  NA  Concentration:  Concentration: Fair  Recall:  Good  Fund of Knowledge: Fair  Language: Good  Akathisia:  Negative  Handed:  Right  AIMS (if indicated): not done  Assets:  Desire for Improvement Housing Resilience Social Support  ADL's:  Intact  Cognition: WNL  Sleep:  Poor   Screenings: GAD-7     Office Visit from 08/03/2019 in [place] Office Visit from 07/04/2019 in [place] Office Visit from 02/20/2018 in Primary [facility] at [facility]  Total GAD-7 Score 20 20 18     PHQ2-9     Office Visit from 08/03/2019 in [place] Office Visit from 07/04/2019 in [place] Office Visit from 02/20/2018 in Primary Care at [facility] Visit from 05/06/2017 in [place]  PHQ-2 Total Score 2 4 3 2   PHQ-9 Total Score 11 20 16 12        Assessment and Plan: 24yo DWFwithbipolar disorder and ADHD(the latter diagnosedwhen she was 72-61 years old). Unfortunately she does not remember medications she has been tried for either of these conditions just says that "there were many". She seems to recall names of Ritalin and Adderall. Available record also shows that she was tried on SSRIs (citalopram, fluoxetine), aripiprazole, oxcarbazepine, hydroxyzine, clonazepam. She describes her mood swings as being very rapid - happening several times during the day. The upswings are described and increased irritability, increased energy, racing thoughts, impulsivity (no euphoria). The longest such an episode can last is less than a day and she denies ever having psychotic symptoms. She also denies having hx of prolonged depressive episodes, suicidal thoughts although she  admits OD on meds impulsively out of frustration when she was 13. She did not go to the hospital then and has npo hx of inpatient psychiatric admissions. She tried to self medicate with marijuana but has not used this drug "for a fewyears". She denies abusing other street or prescription drugs. At one time (few years ago) she was also drinking "too much" but she dos not do it any more. Cannabis use disorder is in full remission. WestartedLamictal titration and escitalopram 10 mg daily. Initially she had muscle cramps and some sedation but both have resolved.She also complainedof difficulty with falling asleep - trazodone has not been very effective for that (150 mg)andregularAmbien only helps her fall asleep but she is up after few hours.I have ordered Ambien CRbut it was not approved by her insurance.For the past few weeks she has been taking temazepam and it initially seemed to be more effective but now she states that it takes few hours for her to fall asleep and she continues to wake up in the middle of the night. We have added Adderall XR 20 mgandwhile she foundit helpful for concentrationis was notyet optimal. We increased dose to 30 mg and she reacted with increased anxiety(thesame happened when we tried Concerta 27 mg) but tolerates 25 mg well.She complainedof feeling stressed out, anxious often and we have added clonazepam for anxiety - she finds it effective.Sleep has not improved on eszopiclone though. She is anxious about neck surgery she will have in three days.  Dx: Bipolar 2ultra rapid cycling; GAD; ADHD  Plan:Increase escitalopram to30mg  for anxiety,continue Lamictal 150 mg bid, Adderall XR25 mg andclonazepam 0.5 mg bid prn anxiety.We will  stop Lunesta and try Seroquel 50 mg at HS for sleep/anxiety. Next appointment in1 month.The plan was discussed with patient who had an opportunity to ask questions and these were all answered. I spend79minutes  inphoneconsultation with the patient.  Visit Diagnosis:    Stephanie Acre, MD 11/23/2019, 9:44 AM

## 2019-11-26 ENCOUNTER — Ambulatory Visit (HOSPITAL_COMMUNITY): Payer: Medicaid Other | Admitting: Vascular Surgery

## 2019-11-26 ENCOUNTER — Ambulatory Visit (HOSPITAL_COMMUNITY): Payer: Medicaid Other

## 2019-11-26 ENCOUNTER — Encounter (HOSPITAL_COMMUNITY)
Admission: RE | Disposition: A | Discharge status: Home / Self Care | Payer: Self-pay | Source: Home / Self Care | Attending: Specialist

## 2019-11-26 ENCOUNTER — Other Ambulatory Visit: Payer: Self-pay

## 2019-11-26 ENCOUNTER — Other Ambulatory Visit (HOSPITAL_COMMUNITY): Payer: Self-pay | Admitting: Psychiatry

## 2019-11-26 ENCOUNTER — Observation Stay (HOSPITAL_COMMUNITY)
Admission: RE | Admit: 2019-11-26 | Discharge: 2019-11-27 | Disposition: A | Discharge status: Home / Self Care | Payer: Medicaid Other | Attending: Specialist | Admitting: Specialist

## 2019-11-26 ENCOUNTER — Encounter (HOSPITAL_COMMUNITY): Payer: Self-pay | Admitting: Specialist

## 2019-11-26 ENCOUNTER — Encounter: Payer: Self-pay | Admitting: Specialist

## 2019-11-26 DIAGNOSIS — E785 Hyperlipidemia, unspecified: Secondary | ICD-10-CM | POA: Diagnosis not present

## 2019-11-26 DIAGNOSIS — F17210 Nicotine dependence, cigarettes, uncomplicated: Secondary | ICD-10-CM | POA: Insufficient documentation

## 2019-11-26 DIAGNOSIS — Z9889 Other specified postprocedural states: Secondary | ICD-10-CM | POA: Diagnosis not present

## 2019-11-26 DIAGNOSIS — J45909 Unspecified asthma, uncomplicated: Secondary | ICD-10-CM | POA: Diagnosis not present

## 2019-11-26 DIAGNOSIS — Z79899 Other long term (current) drug therapy: Secondary | ICD-10-CM | POA: Insufficient documentation

## 2019-11-26 DIAGNOSIS — Z419 Encounter for procedure for purposes other than remedying health state, unspecified: Secondary | ICD-10-CM

## 2019-11-26 DIAGNOSIS — N189 Chronic kidney disease, unspecified: Secondary | ICD-10-CM | POA: Insufficient documentation

## 2019-11-26 DIAGNOSIS — M5010 Cervical disc disorder with radiculopathy, unspecified cervical region: Secondary | ICD-10-CM | POA: Diagnosis present

## 2019-11-26 DIAGNOSIS — F319 Bipolar disorder, unspecified: Secondary | ICD-10-CM | POA: Diagnosis not present

## 2019-11-26 DIAGNOSIS — M50122 Cervical disc disorder at C5-C6 level with radiculopathy: Principal | ICD-10-CM | POA: Diagnosis present

## 2019-11-26 DIAGNOSIS — M50121 Cervical disc disorder at C4-C5 level with radiculopathy: Secondary | ICD-10-CM | POA: Diagnosis not present

## 2019-11-26 DIAGNOSIS — M50222 Other cervical disc displacement at C5-C6 level: Secondary | ICD-10-CM | POA: Diagnosis not present

## 2019-11-26 DIAGNOSIS — M50322 Other cervical disc degeneration at C5-C6 level: Secondary | ICD-10-CM | POA: Diagnosis not present

## 2019-11-26 HISTORY — PX: CERVICAL DISC ARTHROPLASTY: SHX587

## 2019-11-26 HISTORY — DX: Unspecified osteoarthritis, unspecified site: M19.90

## 2019-11-26 HISTORY — DX: Personal history of urinary calculi: Z87.442

## 2019-11-26 HISTORY — DX: Headache, unspecified: R51.9

## 2019-11-26 HISTORY — DX: Hyperlipidemia, unspecified: E78.5

## 2019-11-26 HISTORY — DX: Pneumonia, unspecified organism: J18.9

## 2019-11-26 LAB — COMPREHENSIVE METABOLIC PANEL
ALT: 13 U/L (ref 0–44)
AST: 14 U/L — ABNORMAL LOW (ref 15–41)
Albumin: 3.6 g/dL (ref 3.5–5.0)
Alkaline Phosphatase: 50 U/L (ref 38–126)
Anion gap: 10 (ref 5–15)
BUN: 18 mg/dL (ref 6–20)
CO2: 23 mmol/L (ref 22–32)
Calcium: 9.4 mg/dL (ref 8.9–10.3)
Chloride: 106 mmol/L (ref 98–111)
Creatinine, Ser: 0.66 mg/dL (ref 0.44–1.00)
GFR, Estimated: >60 mL/min (ref >60)
Glucose, Bld: 99 mg/dL (ref 70–99)
Potassium: 3.9 mmol/L (ref 3.5–5.1)
Sodium: 139 mmol/L (ref 135–145)
Total Bilirubin: 0.4 mg/dL (ref 0.3–1.2)
Total Protein: 6.6 g/dL (ref 6.5–8.1)

## 2019-11-26 LAB — CBC
HCT: 40.9 % (ref 36.0–46.0)
Hemoglobin: 13 g/dL (ref 12.0–15.0)
MCH: 30.1 pg (ref 26.0–34.0)
MCHC: 31.8 g/dL (ref 30.0–36.0)
MCV: 94.7 fL (ref 80.0–100.0)
Platelets: 200 10*3/uL (ref 150–400)
RBC: 4.32 MIL/uL (ref 3.87–5.11)
RDW: 13.2 % (ref 11.5–15.5)
WBC: 7.1 10*3/uL (ref 4.0–10.5)
nRBC: 0 % (ref 0.0–0.2)

## 2019-11-26 LAB — PROTIME-INR
INR: 1.1 (ref 0.8–1.2)
Prothrombin Time: 13.3 seconds (ref 11.4–15.2)

## 2019-11-26 LAB — POCT PREGNANCY, URINE: Preg Test, Ur: NEGATIVE

## 2019-11-26 LAB — APTT: aPTT: 29 seconds (ref 24–36)

## 2019-11-26 IMAGING — RF DG C-ARM 1-60 MIN
1 series · 3 of 3 positions shown · non-contrast
Comparison: CT cervical spine dated [DATE].

CLINICAL DATA: C5-C6 arthroplasty.

EXAM:
CERVICAL SPINE - 2-3 VIEW; DG C-ARM 1-60 MIN

[Series 1: run · 3 of 3 slices shown]
[im 1/3]
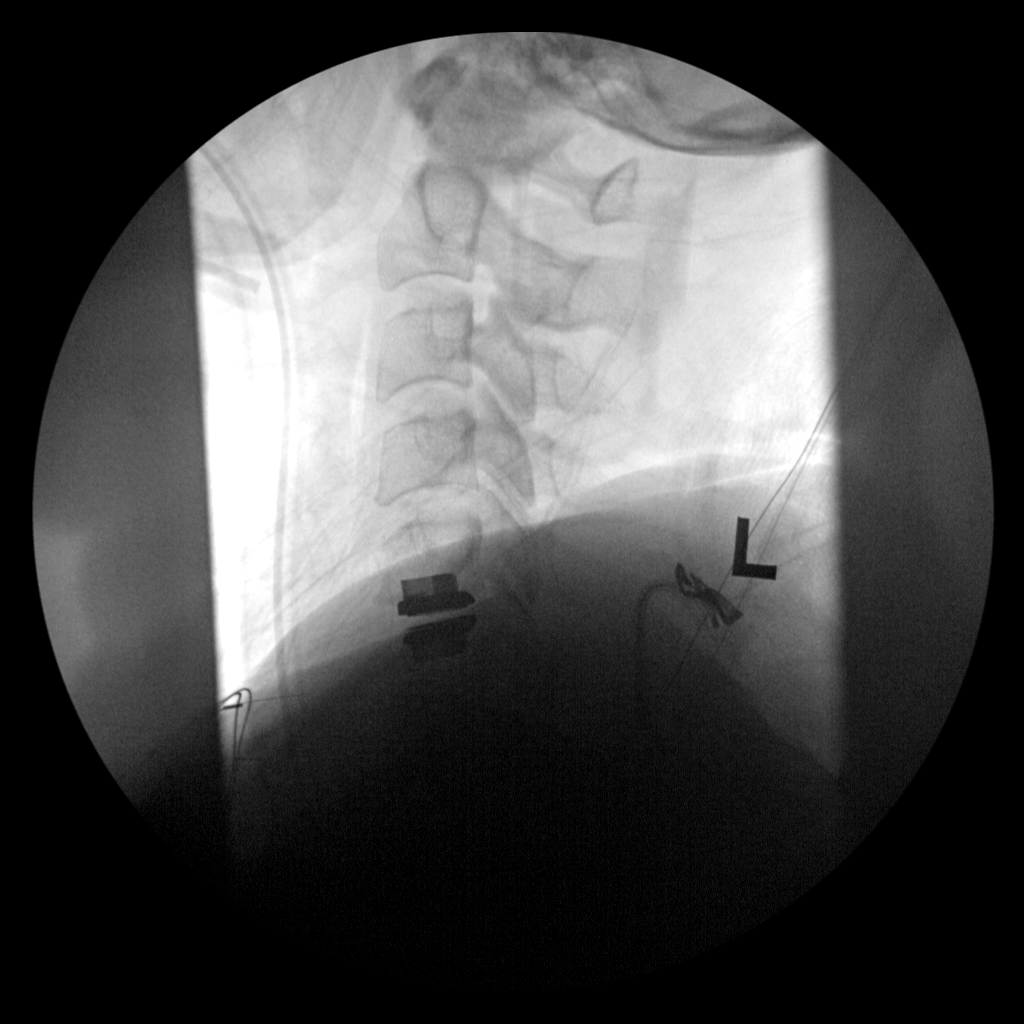
[im 2/3]
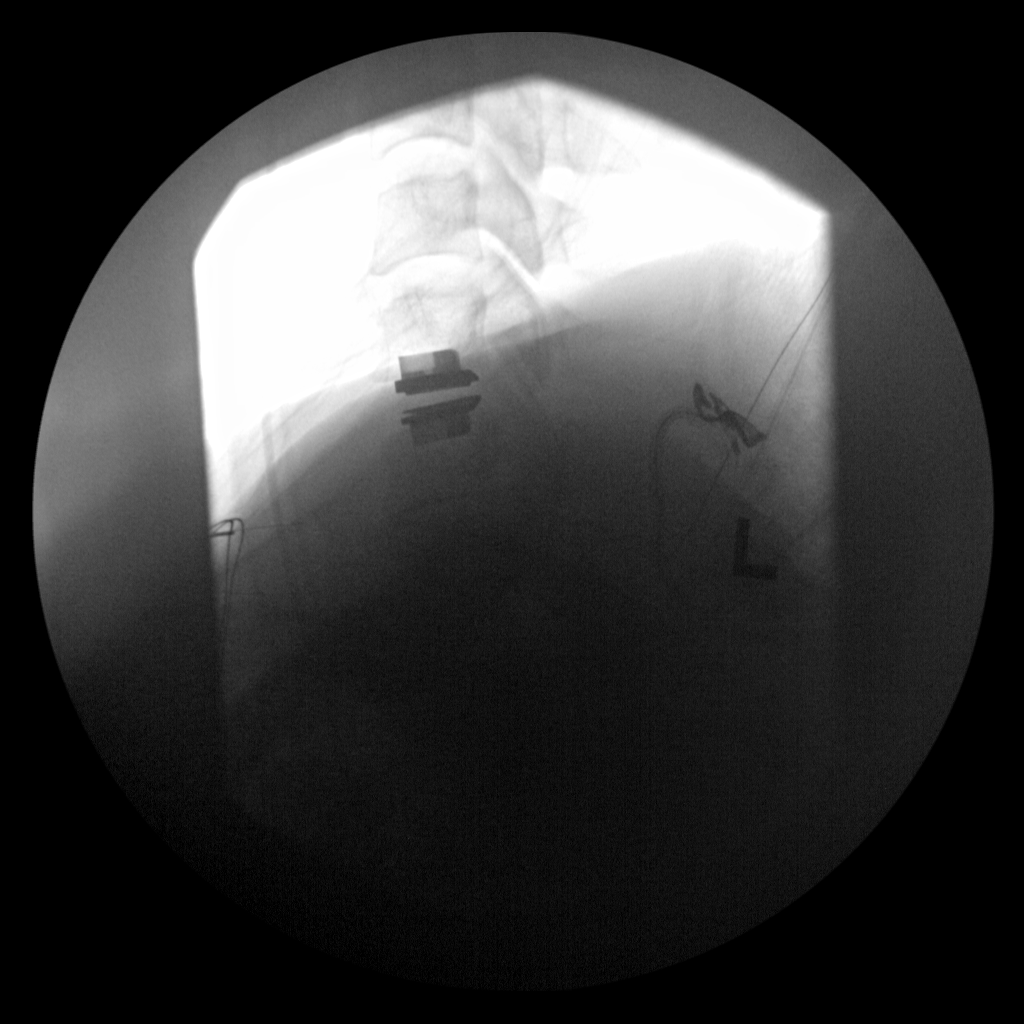
[im 3/3]
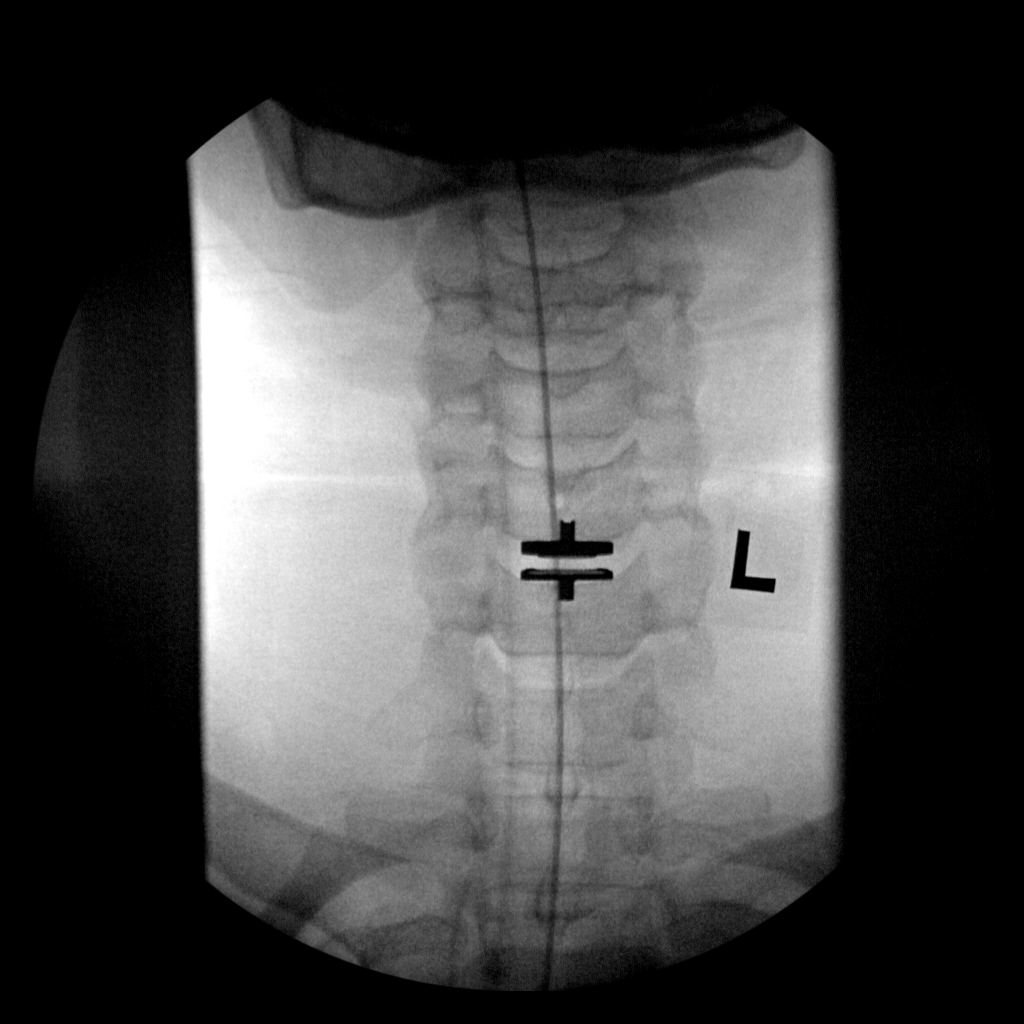

[3 of 3 positions shown; findings below may reference images not displayed]

MRI cervical
spine dated [DATE].

FLUOROSCOPY TIME:  1 minute, 32 seconds.

C-arm fluoroscopic images were obtained intraoperatively and
submitted for post operative interpretation.
FINDINGS: Multiple intraoperative fluoroscopic images demonstrate interval
C5-C6 disc arthroplasty. Arthroplasty component appears in good
position. No acute osseous abnormality.
IMPRESSION: 1. Intraoperative fluoroscopic guidance for C5-C6 disc arthroplasty.

## 2019-11-26 IMAGING — RF DG CERVICAL SPINE 2 OR 3 VIEWS
1 series · 3 of 3 positions shown · non-contrast
Comparison: CT cervical spine dated [DATE].

CLINICAL DATA: C5-C6 arthroplasty.

EXAM:
CERVICAL SPINE - 2-3 VIEW; DG C-ARM 1-60 MIN

[Series 1: run · 3 of 3 slices shown]
[im 1/3]
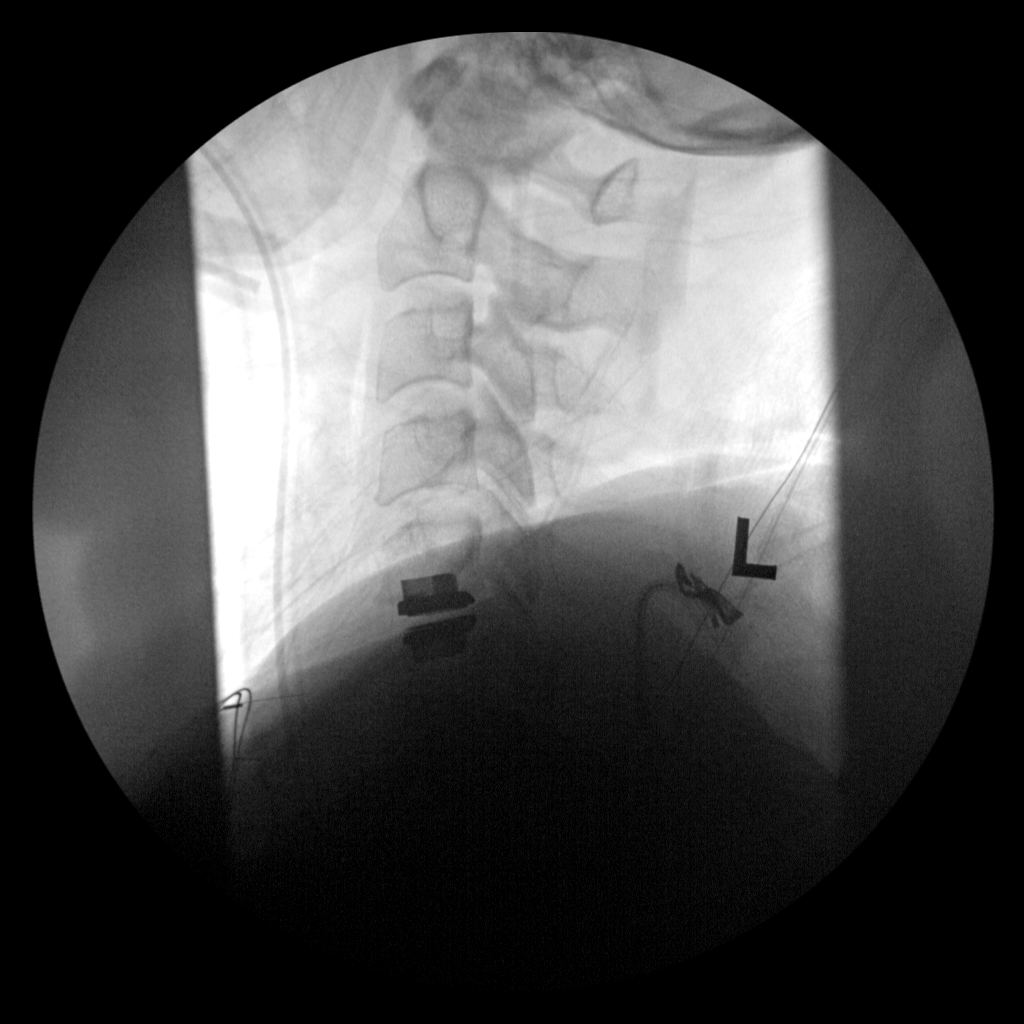
[im 2/3]
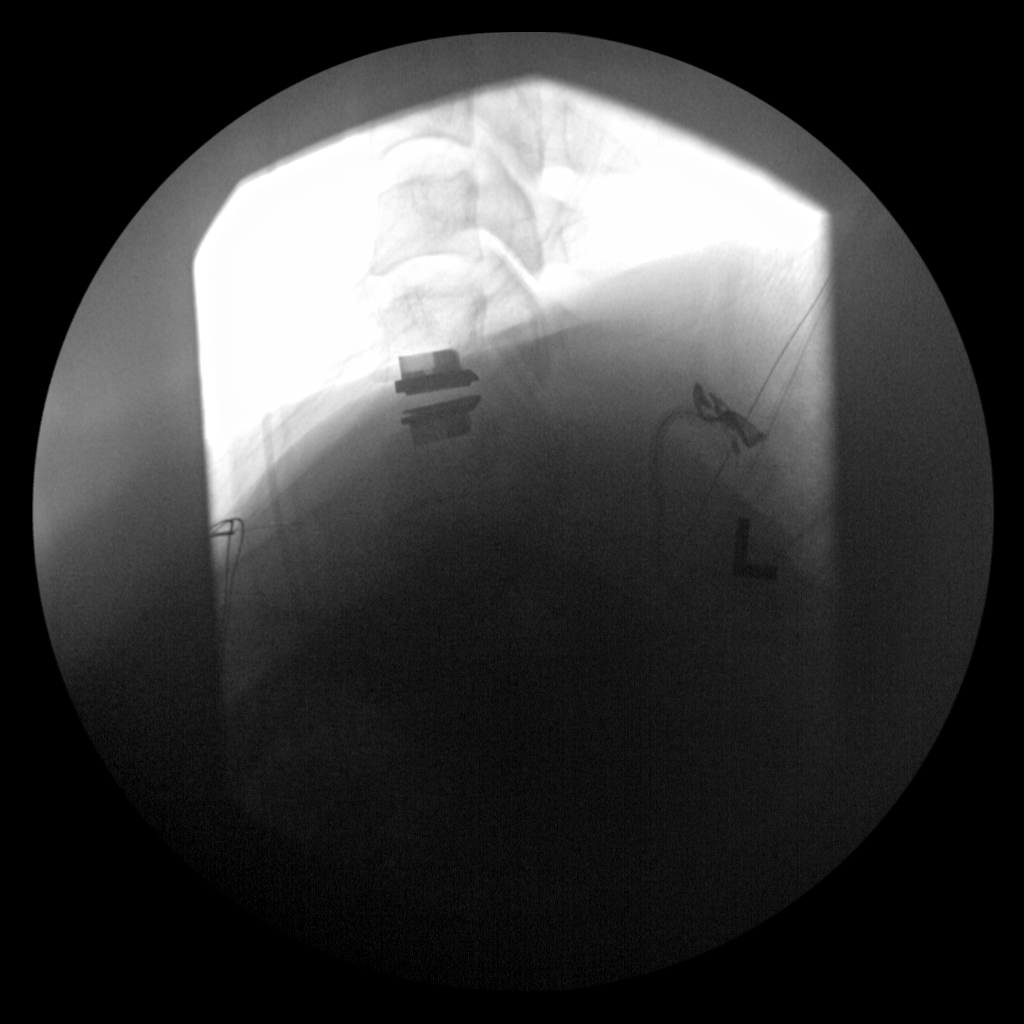
[im 3/3]
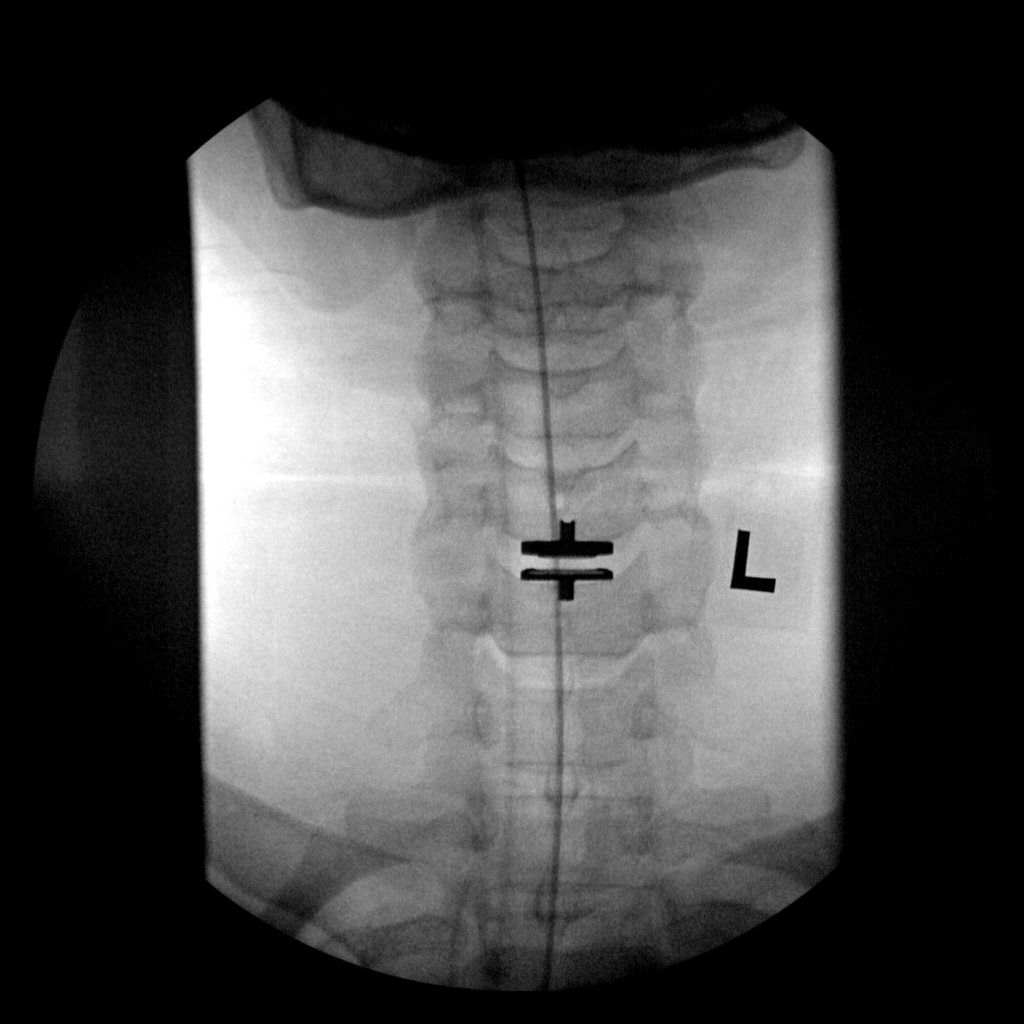

[3 of 3 positions shown; findings below may reference images not displayed]

MRI cervical
spine dated [DATE].

FLUOROSCOPY TIME:  1 minute, 32 seconds.

C-arm fluoroscopic images were obtained intraoperatively and
submitted for post operative interpretation.
FINDINGS: Multiple intraoperative fluoroscopic images demonstrate interval
C5-C6 disc arthroplasty. Arthroplasty component appears in good
position. No acute osseous abnormality.
IMPRESSION: 1. Intraoperative fluoroscopic guidance for C5-C6 disc arthroplasty.

## 2019-11-26 SURGERY — CERVICAL ANTERIOR DISC ARTHROPLASTY
Anesthesia: General

## 2019-11-26 MED ORDER — PHENYLEPHRINE 40 MCG/ML (10ML) SYRINGE FOR IV PUSH (FOR BLOOD PRESSURE SUPPORT)
PREFILLED_SYRINGE | INTRAVENOUS | Status: DC | PRN
  Administered 2019-11-26 (×2): 80 ug via INTRAVENOUS

## 2019-11-26 MED ORDER — THROMBIN (RECOMBINANT) 20000 UNITS EX SOLR
CUTANEOUS | Status: AC
  Filled 2019-11-26: qty 20000

## 2019-11-26 MED ORDER — ONDANSETRON HCL 4 MG/2ML IJ SOLN: 4.0000 mg | Freq: Four times a day (QID) | INTRAMUSCULAR | Status: DC | PRN

## 2019-11-26 MED ORDER — ONDANSETRON HCL 4 MG/2ML IJ SOLN: 4.0000 mg | Freq: Once | INTRAMUSCULAR | Status: DC | PRN

## 2019-11-26 MED ORDER — HYDROCODONE-ACETAMINOPHEN 10-325 MG PO TABS
1.0000 | ORAL_TABLET | ORAL | Status: DC | PRN
  Administered 2019-11-26: 1 via ORAL
  Filled 2019-11-26: qty 1

## 2019-11-26 MED ORDER — SODIUM CHLORIDE 0.9 % IV SOLN: INTRAVENOUS | Status: DC

## 2019-11-26 MED ORDER — OXYCODONE HCL 5 MG/5ML PO SOLN: 5.0000 mg | Freq: Once | ORAL | Status: DC | PRN

## 2019-11-26 MED ORDER — DOCUSATE SODIUM 100 MG PO CAPS
100.0000 mg | ORAL_CAPSULE | Freq: Two times a day (BID) | ORAL | Status: DC
  Administered 2019-11-26 (×2): 100 mg via ORAL
  Filled 2019-11-26 (×2): qty 1

## 2019-11-26 MED ORDER — 0.9 % SODIUM CHLORIDE (POUR BTL) OPTIME
TOPICAL | Status: DC | PRN
  Administered 2019-11-26: 1000 mL

## 2019-11-26 MED ORDER — PROPOFOL 10 MG/ML IV BOLUS
INTRAVENOUS | Status: DC | PRN
  Administered 2019-11-26: 150 mg via INTRAVENOUS

## 2019-11-26 MED ORDER — ALUM & MAG HYDROXIDE-SIMETH 200-200-20 MG/5ML PO SUSP: 30.0000 mL | Freq: Four times a day (QID) | ORAL | Status: DC | PRN

## 2019-11-26 MED ORDER — ESCITALOPRAM OXALATE 20 MG PO TABS
30.0000 mg | ORAL_TABLET | Freq: Every day | ORAL | Status: DC
  Administered 2019-11-26: 30 mg via ORAL
  Filled 2019-11-26 (×2): qty 1

## 2019-11-26 MED ORDER — CELECOXIB 200 MG PO CAPS
200.0000 mg | ORAL_CAPSULE | Freq: Two times a day (BID) | ORAL | Status: DC
  Administered 2019-11-26 (×2): 200 mg via ORAL
  Filled 2019-11-26 (×2): qty 1

## 2019-11-26 MED ORDER — ONDANSETRON HCL 4 MG PO TABS: 4.0000 mg | ORAL_TABLET | Freq: Four times a day (QID) | ORAL | Status: DC | PRN

## 2019-11-26 MED ORDER — CLONAZEPAM 0.5 MG PO TABS: 1.0000 mg | ORAL_TABLET | Freq: Two times a day (BID) | ORAL | Status: DC | PRN

## 2019-11-26 MED ORDER — OXYCODONE HCL 5 MG PO TABS: 5.0000 mg | ORAL_TABLET | Freq: Once | ORAL | Status: DC | PRN

## 2019-11-26 MED ORDER — AMPHETAMINE-DEXTROAMPHET ER 5 MG PO CP24: 25.0000 mg | ORAL_CAPSULE | ORAL | Status: DC

## 2019-11-26 MED ORDER — PHENOL 1.4 % MT LIQD: 1.0000 | OROMUCOSAL | Status: DC | PRN

## 2019-11-26 MED ORDER — LAMOTRIGINE 150 MG PO TABS
150.0000 mg | ORAL_TABLET | Freq: Two times a day (BID) | ORAL | Status: DC
  Administered 2019-11-26: 150 mg via ORAL
  Filled 2019-11-26 (×2): qty 1

## 2019-11-26 MED ORDER — SODIUM CHLORIDE 0.9% FLUSH: 3.0000 mL | INTRAVENOUS | Status: DC | PRN

## 2019-11-26 MED ORDER — PROPOFOL 10 MG/ML IV BOLUS
INTRAVENOUS | Status: AC
  Filled 2019-11-26: qty 20

## 2019-11-26 MED ORDER — SUCCINYLCHOLINE CHLORIDE 20 MG/ML IJ SOLN
INTRAMUSCULAR | Status: DC | PRN
  Administered 2019-11-26: 120 mg via INTRAVENOUS

## 2019-11-26 MED ORDER — FENTANYL CITRATE (PF) 250 MCG/5ML IJ SOLN
INTRAMUSCULAR | Status: AC
  Filled 2019-11-26: qty 5

## 2019-11-26 MED ORDER — PROPOFOL 500 MG/50ML IV EMUL
INTRAVENOUS | Status: DC | PRN
  Administered 2019-11-26: 75 ug/kg/min via INTRAVENOUS
  Administered 2019-11-26: 50 ug/kg/min via INTRAVENOUS

## 2019-11-26 MED ORDER — PANTOPRAZOLE SODIUM 40 MG IV SOLR
40.0000 mg | Freq: Every day | INTRAVENOUS | Status: DC
  Administered 2019-11-26: 40 mg via INTRAVENOUS
  Filled 2019-11-26: qty 40

## 2019-11-26 MED ORDER — BISACODYL 5 MG PO TBEC: 5.0000 mg | DELAYED_RELEASE_TABLET | Freq: Every day | ORAL | Status: DC | PRN

## 2019-11-26 MED ORDER — BUPIVACAINE LIPOSOME 1.3 % IJ SUSP
INTRAMUSCULAR | Status: DC | PRN
  Administered 2019-11-26: 4 mL

## 2019-11-26 MED ORDER — FENTANYL CITRATE (PF) 250 MCG/5ML IJ SOLN
INTRAMUSCULAR | Status: AC
Start: 2019-11-26 — End: ?
  Filled 2019-11-26: qty 5

## 2019-11-26 MED ORDER — DEXMEDETOMIDINE (PRECEDEX) IN NS 20 MCG/5ML (4 MCG/ML) IV SYRINGE
PREFILLED_SYRINGE | INTRAVENOUS | Status: DC | PRN
  Administered 2019-11-26: 8 ug via INTRAVENOUS
  Administered 2019-11-26: 12 ug via INTRAVENOUS

## 2019-11-26 MED ORDER — VANCOMYCIN HCL IN DEXTROSE 1-5 GM/200ML-% IV SOLN
1000.0000 mg | Freq: Once | INTRAVENOUS | Status: AC
  Administered 2019-11-26: 1000 mg via INTRAVENOUS
  Filled 2019-11-26: qty 200

## 2019-11-26 MED ORDER — MORPHINE SULFATE (PF) 2 MG/ML IV SOLN: 1.0000 mg | INTRAVENOUS | Status: DC | PRN

## 2019-11-26 MED ORDER — FENTANYL CITRATE (PF) 100 MCG/2ML IJ SOLN
25.0000 ug | INTRAMUSCULAR | Status: DC | PRN
  Administered 2019-11-26: 25 ug via INTRAVENOUS

## 2019-11-26 MED ORDER — LORATADINE 10 MG PO TABS
10.0000 mg | ORAL_TABLET | Freq: Every day | ORAL | Status: DC
  Administered 2019-11-26: 10 mg via ORAL
  Filled 2019-11-26: qty 1

## 2019-11-26 MED ORDER — MENTHOL 3 MG MT LOZG: 1.0000 | LOZENGE | OROMUCOSAL | Status: DC | PRN

## 2019-11-26 MED ORDER — METHOCARBAMOL 1000 MG/10ML IJ SOLN
500.0000 mg | Freq: Four times a day (QID) | INTRAVENOUS | Status: DC | PRN
  Filled 2019-11-26: qty 5

## 2019-11-26 MED ORDER — ALBUTEROL SULFATE HFA 108 (90 BASE) MCG/ACT IN AERS
1.0000 | INHALATION_SPRAY | Freq: Four times a day (QID) | RESPIRATORY_TRACT | Status: DC | PRN
  Filled 2019-11-26: qty 6.7

## 2019-11-26 MED ORDER — FENTANYL CITRATE (PF) 250 MCG/5ML IJ SOLN
INTRAMUSCULAR | Status: DC | PRN
  Administered 2019-11-26 (×2): 50 ug via INTRAVENOUS
  Administered 2019-11-26: 200 ug via INTRAVENOUS
  Administered 2019-11-26: 50 ug via INTRAVENOUS

## 2019-11-26 MED ORDER — FLEET ENEMA 7-19 GM/118ML RE ENEM: 1.0000 | ENEMA | Freq: Once | RECTAL | Status: DC | PRN

## 2019-11-26 MED ORDER — ACETAMINOPHEN 325 MG PO TABS: 650.0000 mg | ORAL_TABLET | ORAL | Status: DC | PRN

## 2019-11-26 MED ORDER — HYDROCODONE-ACETAMINOPHEN 7.5-325 MG PO TABS
2.0000 | ORAL_TABLET | ORAL | Status: DC | PRN
  Administered 2019-11-26: 2 via ORAL
  Filled 2019-11-26: qty 2

## 2019-11-26 MED ORDER — BUPIVACAINE LIPOSOME 1.3 % IJ SUSP
20.0000 mL | INTRAMUSCULAR | Status: DC
  Filled 2019-11-26: qty 20

## 2019-11-26 MED ORDER — FENTANYL CITRATE (PF) 100 MCG/2ML IJ SOLN
INTRAMUSCULAR | Status: AC
Start: 2019-11-26 — End: 2019-11-27
  Filled 2019-11-26: qty 2

## 2019-11-26 MED ORDER — FLUTICASONE PROPIONATE 50 MCG/ACT NA SUSP
1.0000 | Freq: Every day | NASAL | Status: DC
  Filled 2019-11-26: qty 16

## 2019-11-26 MED ORDER — ACETAMINOPHEN 650 MG RE SUPP: 650.0000 mg | RECTAL | Status: DC | PRN

## 2019-11-26 MED ORDER — ORAL CARE MOUTH RINSE: 15.0000 mL | Freq: Once | OROMUCOSAL | Status: AC

## 2019-11-26 MED ORDER — LIDOCAINE 2% (20 MG/ML) 5 ML SYRINGE
INTRAMUSCULAR | Status: DC | PRN
  Administered 2019-11-26: 75 mg via INTRAVENOUS
  Administered 2019-11-26: 25 mg via INTRAVENOUS

## 2019-11-26 MED ORDER — CHLORHEXIDINE GLUCONATE 0.12 % MT SOLN
OROMUCOSAL | Status: AC
  Administered 2019-11-26: 15 mL via OROMUCOSAL
  Filled 2019-11-26: qty 15

## 2019-11-26 MED ORDER — POLYETHYLENE GLYCOL 3350 17 G PO PACK: 17.0000 g | PACK | Freq: Every day | ORAL | Status: DC | PRN

## 2019-11-26 MED ORDER — GABAPENTIN 300 MG PO CAPS
300.0000 mg | ORAL_CAPSULE | Freq: Three times a day (TID) | ORAL | Status: DC
  Administered 2019-11-26 (×2): 300 mg via ORAL
  Filled 2019-11-26 (×2): qty 1

## 2019-11-26 MED ORDER — BENZONATATE 100 MG PO CAPS
100.0000 mg | ORAL_CAPSULE | Freq: Three times a day (TID) | ORAL | Status: DC
  Administered 2019-11-26: 100 mg via ORAL
  Filled 2019-11-26 (×4): qty 1

## 2019-11-26 MED ORDER — CHLORHEXIDINE GLUCONATE 0.12 % MT SOLN: 15.0000 mL | Freq: Once | OROMUCOSAL | Status: AC

## 2019-11-26 MED ORDER — MIDAZOLAM HCL 5 MG/5ML IJ SOLN
INTRAMUSCULAR | Status: DC | PRN
  Administered 2019-11-26: 2 mg via INTRAVENOUS

## 2019-11-26 MED ORDER — BUPIVACAINE HCL (PF) 0.5 % IJ SOLN
INTRAMUSCULAR | Status: AC
  Filled 2019-11-26: qty 30

## 2019-11-26 MED ORDER — THROMBIN 20000 UNITS EX SOLR
CUTANEOUS | Status: DC | PRN
  Administered 2019-11-26: 20 mL via TOPICAL

## 2019-11-26 MED ORDER — ONDANSETRON HCL 4 MG/2ML IJ SOLN
INTRAMUSCULAR | Status: DC | PRN
  Administered 2019-11-26: 4 mg via INTRAVENOUS

## 2019-11-26 MED ORDER — SODIUM CHLORIDE 0.9% FLUSH
3.0000 mL | Freq: Two times a day (BID) | INTRAVENOUS | Status: DC
  Administered 2019-11-26: 3 mL via INTRAVENOUS

## 2019-11-26 MED ORDER — MIDAZOLAM HCL 2 MG/2ML IJ SOLN
INTRAMUSCULAR | Status: AC
  Filled 2019-11-26: qty 2

## 2019-11-26 MED ORDER — QUETIAPINE FUMARATE 50 MG PO TABS
50.0000 mg | ORAL_TABLET | Freq: Every day | ORAL | Status: DC
  Administered 2019-11-26: 50 mg via ORAL
  Filled 2019-11-26: qty 1

## 2019-11-26 MED ORDER — VANCOMYCIN HCL IN DEXTROSE 1-5 GM/200ML-% IV SOLN
1000.0000 mg | INTRAVENOUS | Status: AC
  Administered 2019-11-26: 1000 mg via INTRAVENOUS
  Filled 2019-11-26: qty 200

## 2019-11-26 MED ORDER — DEXAMETHASONE SODIUM PHOSPHATE 10 MG/ML IJ SOLN
INTRAMUSCULAR | Status: DC | PRN
  Administered 2019-11-26: 8 mg via INTRAVENOUS

## 2019-11-26 MED ORDER — HYDROCODONE-ACETAMINOPHEN 7.5-325 MG PO TABS
1.0000 | ORAL_TABLET | Freq: Four times a day (QID) | ORAL | Status: DC
  Administered 2019-11-26 – 2019-11-27 (×3): 1 via ORAL
  Filled 2019-11-26 (×3): qty 1

## 2019-11-26 MED ORDER — METHOCARBAMOL 500 MG PO TABS
500.0000 mg | ORAL_TABLET | Freq: Four times a day (QID) | ORAL | Status: DC | PRN
  Administered 2019-11-26: 500 mg via ORAL
  Filled 2019-11-26: qty 1

## 2019-11-26 MED ORDER — LACTATED RINGERS IV SOLN: INTRAVENOUS | Status: DC

## 2019-11-26 SURGICAL SUPPLY — 63 items
BENZOIN TINCTURE PRP APPL 2/3 (GAUZE/BANDAGES/DRESSINGS) ×3 IMPLANT
BIT MILLING PRODISC 2.0 (BIT) ×3 IMPLANT
BNDG COHESIVE 1X5 TAN STRL LF (GAUZE/BANDAGES/DRESSINGS) ×3 IMPLANT
BUR SABER RD CUTTING 3.0 (BURR) ×2 IMPLANT
BUR SABER RD CUTTING 3.0MM (BURR) ×1
CANISTER SUCT 3000ML PPV (MISCELLANEOUS) ×3 IMPLANT
CLOSURE WOUND 1/2 X4 (GAUZE/BANDAGES/DRESSINGS) ×1
COLLAR CERV PROCARE ST 2.25 (SOFTGOODS) ×3 IMPLANT
COVER SURGICAL LIGHT HANDLE (MISCELLANEOUS) ×3 IMPLANT
COVER WAND RF STERILE (DRAPES) ×3 IMPLANT
DERMABOND ADVANCED (GAUZE/BANDAGES/DRESSINGS) ×2
DERMABOND ADVANCED .7 DNX12 (GAUZE/BANDAGES/DRESSINGS) ×1 IMPLANT
DISC PRODISC-C MED 6MM (Neuro Prosthesis/Implant) ×3 IMPLANT
DRAPE C-ARM 42X72 X-RAY (DRAPES) ×3 IMPLANT
DRAPE C-ARMOR (DRAPES) ×3 IMPLANT
DRAPE MICROSCOPE LEICA (MISCELLANEOUS) ×3 IMPLANT
DRAPE POUCH INSTRU U-SHP 10X18 (DRAPES) ×3 IMPLANT
DRAPE SURG 17X23 STRL (DRAPES) ×9 IMPLANT
DRSG MEPILEX BORDER 4X4 (GAUZE/BANDAGES/DRESSINGS) ×3 IMPLANT
DURAPREP 6ML APPLICATOR 50/CS (WOUND CARE) ×3 IMPLANT
ELECT COATED BLADE 2.86 ST (ELECTRODE) ×3 IMPLANT
ELECT REM PT RETURN 9FT ADLT (ELECTROSURGICAL) ×3
ELECTRODE REM PT RTRN 9FT ADLT (ELECTROSURGICAL) ×1 IMPLANT
FEE INTRAOP MONITOR IMPULS NCS (MISCELLANEOUS) ×1 IMPLANT
GLOVE BIOGEL PI IND STRL 8 (GLOVE) ×1 IMPLANT
GLOVE BIOGEL PI INDICATOR 8 (GLOVE) ×2
GLOVE ECLIPSE 9.0 STRL (GLOVE) ×3 IMPLANT
GLOVE ORTHO TXT STRL SZ7.5 (GLOVE) ×3 IMPLANT
GLOVE SURG 8.5 LATEX PF (GLOVE) ×3 IMPLANT
GOWN STRL REUS W/ TWL LRG LVL3 (GOWN DISPOSABLE) ×1 IMPLANT
GOWN STRL REUS W/TWL 2XL LVL3 (GOWN DISPOSABLE) ×6 IMPLANT
GOWN STRL REUS W/TWL LRG LVL3 (GOWN DISPOSABLE) ×2
HALTER HD/CHIN CERV TRACTION D (MISCELLANEOUS) ×3 IMPLANT
INTRAOP MONITOR FEE IMPULS NCS (MISCELLANEOUS) ×1
INTRAOP MONITOR FEE IMPULSE (MISCELLANEOUS) ×2
KIT BASIN OR (CUSTOM PROCEDURE TRAY) ×3 IMPLANT
KIT TURNOVER KIT B (KITS) ×3 IMPLANT
NEEDLE SPNL 20GX3.5 QUINCKE YW (NEEDLE) ×6 IMPLANT
NS IRRIG 1000ML POUR BTL (IV SOLUTION) ×3 IMPLANT
NUT RETAINER F/PRODISC (ORTHOPEDIC DISPOSABLE SUPPLIES) ×6 IMPLANT
PACK ORTHO CERVICAL (CUSTOM PROCEDURE TRAY) ×3 IMPLANT
PAD ARMBOARD 7.5X6 YLW CONV (MISCELLANEOUS) ×6 IMPLANT
PATTIES SURGICAL .5 X.5 (GAUZE/BANDAGES/DRESSINGS) ×3 IMPLANT
Recothrom 20,000 U ×3 IMPLANT
SCREW RETAINER 12 (Screw) ×6 IMPLANT
SPONGE INTESTINAL PEANUT (DISPOSABLE) ×6 IMPLANT
SPONGE LAP 4X18 RFD (DISPOSABLE) ×9 IMPLANT
SPONGE SURGIFOAM ABS GEL 100 (HEMOSTASIS) ×3 IMPLANT
SPONGE SURGIFOAM ABS GEL 100C (HEMOSTASIS) ×3 IMPLANT
STRIP CLOSURE SKIN 1/2X4 (GAUZE/BANDAGES/DRESSINGS) ×2 IMPLANT
SUT VIC AB 2-0 CT1 27 (SUTURE) ×2
SUT VIC AB 2-0 CT1 36 (SUTURE) ×3 IMPLANT
SUT VIC AB 2-0 CT1 TAPERPNT 27 (SUTURE) ×1 IMPLANT
SUT VIC AB 2-0 CTB1 (SUTURE) ×3 IMPLANT
SUT VIC AB 3-0 PS2 18 (SUTURE) ×3 IMPLANT
SUT VICRYL CTD 3-0 1X27 RB-1 (SUTURE) ×3
SUTURE VICRL CTD 3-0 1X27 RB-1 (SUTURE) ×1 IMPLANT
SYR 20ML LL LF (SYRINGE) ×3 IMPLANT
SYR CONTROL 10ML LL (SYRINGE) ×3 IMPLANT
TOWEL GREEN STERILE (TOWEL DISPOSABLE) ×3 IMPLANT
TOWEL GREEN STERILE FF (TOWEL DISPOSABLE) ×3 IMPLANT
WATER STERILE IRR 1000ML POUR (IV SOLUTION) ×3 IMPLANT
retaining pin 03820102 ×6 IMPLANT

## 2019-11-26 NOTE — Anesthesia Procedure Notes (Signed)
Procedure Name: Intubation Date/Time: 11/26/2019 7:50 AM Performed by: Mariea Clonts, CRNA Pre-anesthesia Checklist: Patient identified, Emergency Drugs available, Suction available, Patient being monitored and Timeout performed Patient Re-evaluated:Patient Re-evaluated prior to induction Oxygen Delivery Method: Circle system utilized Preoxygenation: Pre-oxygenation with 100% oxygen Induction Type: IV induction Ventilation: Mask ventilation without difficulty Laryngoscope Size: Miller and 2 Grade View: Grade I Tube size: 7.0 mm Number of attempts: 1 Placement Confirmation: ETT inserted through vocal cords under direct vision,  breath sounds checked- equal and bilateral and positive ETCO2 Tube secured with: Tape Dental Injury: Teeth and Oropharynx as per pre-operative assessment

## 2019-11-26 NOTE — Op Note (Signed)
11/26/2019  11:22 AM  PATIENT:  Kellie Martinez  24 y.o. female  MRN: [id number]  OPERATIVE REPORT  PRE-OPERATIVE DIAGNOSIS:  cervical disc herniation bilateral C5-6 with cord compression, mild degenerative disc disease C4-5, C6-7  POST-OPERATIVE DIAGNOSIS:  cervical disc herniation bilateral C5-6 with cord compression, mild degenerative disc disease C4-5, C6-7  PROCEDURE:  Procedure(s): CERVICAL DISC ARTHROPLASTY C5-6 WITH PRO DISC    SURGEON:  Jessy Oto, MD     ASSISTANT:  Benjiman Core, PA-C  (Present throughout the entire procedure and necessary for completion of procedure in a timely manner)     ANESTHESIA:  General,    COMPLICATIONS:  None.     COMPONENTS:  Implant Name Type Inv. Item Serial No. Manufacturer Lot No. LRB No. Used Action  DISC PRODISC-C MED 6MM - [id number] Neuro Prosthesis/Implant DISC PRODISC-C MED 6MM  [employer] [id number] N/A 1 Implanted    PROCEDURE:The patient was met in the holding area, and the appropriate  left C5.6 cervical level identified and marked with an "x" and my initials. The patient was then transported to OR and was placed on the operative table in a supine position head supported on the well padded [place] horseshoe. The patient was then placed under  general anesthesia without difficulty intubated atraumaticly.      Cervical spine was positioned supine with pad at the cervicothoracic junction posteriorly, head taped over forehead and at the chin and 5 pound cervical halter traction over the cranial head of the bed. All pressure points well padded and semi-beach chair position. Standard prep with DuraPrep solution the anterior cervical spine chest. Draped in the usual manner. Iodine vi drape was used. Standard timeout protocol was carried out identifying the patient procedure side of the procedure and level. The skin the left neck was infiltrated with marcaine 0.5% 1:1 exparel 1.3% total of 5 cc. This at the level of expected C5-6  incision and also along a skin crease in line with the patients lines of Langer. Incision transverse at the C6 level and carried down to the level of the platysma. Then was carried down to the anterior aspect of the sternocleidomastoid muscle. The interval between the trachea and esophagus medially and the carotid sheath laterally was developed as a Metzenbaum scissors and blunt dissection exposing the anterior aspect of cervical spine at the C6 level.  The prevertebral fascia anterior to the cervical was cauterized with bipolar and teased across the midline with a Art therapist. An 25-gauge small needle was placed extend into the C5-6 disc and observed on lateral radiogragh  at the C5-6 level. Handheld Cloward retraction of the soft tissues while identifying the level at C5-6 and also while removing a portion of the anterior aspect of the disc with15 blade scalpel and pituitary. Medial border of the longus collie muscles was carefully elevated bilaterally exposing the lateral aspects of the vertebral body of  C5 and C6 and at the disc space Boss McCoullough self-retaining retractors were introduced the foot of the blade beneath the medial border of the longus colli muscles. Soft tissue overlying the anterior borders of the disc space at C5-6 level carefully debridement of soft tissue back to bony edges. The anterior lip osteophytes were then resected using hand rongeur. An awl used to make an initial entry point into the lower one third of the vertebral body of C6 centered medial-lateral in the center of the vertebral body in line with the spinous processes and driving in parallel to the inferior  endplate of C6. The pin confirmed with C-arm brought sterilely into the OR field with a side C-arm drape then replaced with a 14 mm screw post. This was repeated at the Upper one third of the vertebral body of C5. The retaining scaffolding then placed with minimal distraction. The disc space at C5-6 was then debrided  with the sterile OR microscope. The inferior endplate of C5 and the superior endplate of C6 debrided of cartilage endplate and disc material posteriorly to the posterior longitudinal ligament. The disc space distractor for mobilizing the posterior disc was inserted using the C arm fluoro with the tips of the mobilizer on the posterior cortex of the disc space at C5-6. The disc was unable to be mobilized so that the microscope was used to assist with the removal of the posterior longitudinal ligament and disc herniation material extending to the left and centrally. With the posterior longitudinal ligament resected and small amount of posterior osteophytes at the C5-6 level were resected. Decompression was complete and the disc and cartilage endplates resected with Tessie Eke currettes and pituitary ronguers.Under the operating room microscope the the distration of the posterior disc space was able to be completed. The trial implant was the 17m x 158m [id number]   Depth and 15 mm wide that provide the smallest size to cover the most of the endplates at the C5-X3-2vel with 6 mm height the smallest height that corresponded to the adjacent level disc heights.  The M [id number] i65mant spacer was then carefully placed over the intervertebral disc space at C5-6 level. Care taken to ensure that no bone or soft tissue debris within the disc space that could be retropulsed with insertion of the trial implant spacer. The implant was then impacted into place with the head placed in longitudinal cervical traction.  The implant was felt to be in excellent position alignment on the AP and lateral C-arm views. Cervical traction was removed and the cutting block for the keels of the prodisc c. The sharp pin inserted into the inferior drill opening and tapped into the vertebral body of C6. The high speed burr for drilling the keel opening into the inferior endplate of C5 then place to cortex then drilling of the keel opening  made with sweeping moves with the cutting burr. After completing the upper keel opening a blunt pin place into the upper keel slot opening and the sharp pin removed from the vertebral body of C6 and the drill for cutting the slot for the lower keel inserted. Drilling again completed with sweeping movements at the superior endplate of C6. The final chisel was used to complete the groves for the keel. The channels for the keels then clean out with the hooked groove cleaner under magnification and confirming with the Carm. The permanent implant PRODISC C [id number] mm then positioned over the disc opening and the keel opening and the arthroplasty impacted into position carefully adjusting the implant plane of insertion on AP and lateral Intraoperative Carm fluoroscopy. After insertion the self retaining retractors and the retaining screws were removed. C-arm showed that the arthroplasty was not completely seated posteriorly so that the impactor for the arthroplasty was apply to the anterior grove in the metallic portion of the arthroplasty and the arthroplasty components tapped placing posterior portion of the prodisc-c components within the posterior one third of the disc space. The Prodisc-C appeared in good position alignment on both the AP and lateral views with C arm. Upper extremity  longitudinal traction using wrist restraints was necessary to obtain visualization of the lower C6 cervical level Throughout the case. After documenting the placement of the Prodisc c components bone wax was applied to the screw post openings and the the rawbleeding anterior surfaces superficially, excess bone wax was removed and the area irrigated with copious amounts of normal saline.  The esophagus examined at the cervical level  and found to be normal. Irrigation was again carried out there was no active bleeding present. A drain was not felt to be necessary. The incision was then closed by approximating the deep subcutaneous  layers the platysma layer with interrupted 3-0 Vicryl suture and the superficial fascia overlying the sternocleidomastoid muscle with interrupted 3-0 Vicryl sutures. The subcutaneous layers were approximated with interrupted 3-0 Vicryl sutures as were the superficial layers. The skin was closed with a running subcutaneous stitch of 4-0 Vicryl at the operative C6 transverse incision site. Skin was approximated with steristrips and tincture of benzoin. Mepilex bandage was applied. A soft cervical  collar was then applied to the cervical spine. The patient then reactivated and extubated and return to the recovery PACU in satisfactory condition. Physician assistant's responsibilities: Esaw Grandchild  perform the duties of assistant surgeon during this case present from the beginning of the case to the end of the case. He assisted with careful retraction of neural structures suctioning about her elements including cervical cord and C6 nerve roots. He help with exposure the removal of disc and decompression of the left C6 neuroforamen from his position on the opposite side of the table. His assistance was vital to completion and performing this procedure as he has more surgical experience at performing this surgery. He assisted in positioning the patient pre operatively and with the aligning of the initial retaining pins.    Basil Dess 11/26/2019, 11:22 AM

## 2019-11-26 NOTE — Transfer of Care (Signed)
Immediate Anesthesia Transfer of Care Note  Patient: Kellie Martinez  Procedure(s) Performed: CERVICAL DISC ARTHROPLASTY C5-6 WITH PRO DISC (N/A )  Patient Location: PACU  Anesthesia Type:General  Level of Consciousness: awake, alert  and oriented  Airway & Oxygen Therapy: Patient Spontanous Breathing and Patient connected to nasal cannula oxygen  Post-op Assessment: Report given to RN, Post -op Vital signs reviewed and stable and Patient moving all extremities X 4  Post vital signs: Reviewed and stable  Last Vitals:  Vitals Value Taken Time  BP 96/49 11/26/19 1129  Temp    Pulse 92 11/26/19 1131  Resp 15 11/26/19 1131  SpO2 100 % 11/26/19 1131  Vitals shown include unvalidated device data.  Last Pain:  Vitals:   11/26/19 0658  TempSrc:   PainSc: 3       Patients Stated Pain Goal: 3 (59/74/16 3845)  Complications: No complications documented.

## 2019-11-26 NOTE — Brief Op Note (Signed)
11/26/2019  11:03 AM  PATIENT:  Kellie Martinez  24 y.o. female  PRE-OPERATIVE DIAGNOSIS:  cervical disc herniation bilateral C5-6 with cord compression, mild degenerative disc disease C4-5, C6-7  POST-OPERATIVE DIAGNOSIS:  cervical disc herniation bilateral C5-6 with cord compression, mild degenerative disc disease C4-5, C6-7  PROCEDURE:  Procedure(s): CERVICAL DISC ARTHROPLASTY C5-6 WITH PRO DISC (N/A)  SURGEON:  Surgeon(s) and Role:    * Jessy Oto, MD - Primary  PHYSICIAN ASSISTANT: Benjiman Core, PA-C  ANESTHESIA:   local and general , Dr. Linna Caprice  EBL:  75 mL   BLOOD ADMINISTERED:none  DRAINS: none   LOCAL MEDICATIONS USED:  MARCAINE 0.5% 1:1 EXPAREL 1.3% Amount: 5 ml  SPECIMEN:  No Specimen  DISPOSITION OF SPECIMEN:  N/A  COUNTS:  YES  TOURNIQUET:  * No tourniquets in log *  DICTATION: .Dragon Dictation  PLAN OF CARE: Admit for overnight observation  PATIENT DISPOSITION:  PACU - hemodynamically stable.   Delay start of Pharmacological VTE agent (>24hrs) due to surgical blood loss or risk of bleeding: yes

## 2019-11-26 NOTE — Evaluation (Signed)
Physical Therapy Evaluation Patient Details Name: Kellie Martinez MRN: [id number] DOB: March 14, 1995 Today's Date: 11/26/2019   History of Present Illness  The pt is a 24 yo female presenting s/p cervical disc arthroplasty of C5-6 due to cervical disc herniation with C5-6 cord compression. PMH includes: axiety, bipolar 1 disorder, CKD, substance abuse, and x2 suicide attempts.   Clinical Impression  Pt in bed upon arrival of PT, agreeable to evaluation at this time. Prior to admission the pt was independent with mobility, caring for 3 young children, and performing all home-making activities without use of AD or assist. She reports she was limited by pain in her neck and shoulders, and has had progressive balance difficulties with her R knee giving out on her a few times. The pt now presents with limitations in functional mobility, activity tolerance, strength, and stability due to above dx and resulting pain, and will continue to benefit from skilled PT to address these deficits. The pt was able to demo good independence with bed mobility and initial transfers, but benefits from support and minA with prolonged ambulation to steady and support. The pt was able to navigate 10 steps with use of 1 rail, but does not have rails on one of the sets of steps into her home. The pt reports she has a SPC at home, and would benefit from gait training with a cane to facilitate improved stability and independence with mobility at home, in addition to improved activity tolerance. The pt was educated at length on application of cervical precautions to activities at home, but the pt will benefit from continued reinforcement prior to d/c home with family.   The pt noted that her significant other will be able to stay at home with her for 1-2 weeks if provided a letter from the hospital to give his boss explaining the fact he needs to be at home to assist the pt. She will need supervision for mobility as well as assist for  all home-making activities.       Follow Up Recommendations Home health PT;Supervision for mobility/OOB    Equipment Recommendations  None recommended by PT    Recommendations for Other Services       Precautions / Restrictions Precautions Precautions: Cervical Precaution Booklet Issued: Yes (comment) Precaution Comments: poor understanding of application to functional tasks despite multiple attempts at education, will require continued education Required Braces or Orthoses: Cervical Brace Cervical Brace: Soft collar (when OOB) Restrictions Weight Bearing Restrictions: No      Mobility  Bed Mobility Overal bed mobility: Modified Independent             General bed mobility comments: increased time, use of elevated HOB  Transfers Overall transfer level: Needs assistance Equipment used: None Transfers: Sit to/from Stand Sit to Stand: Supervision         General transfer comment: pt able to complete sit-stand from bed and to recliner without physical assist, no cues needed for safety  Ambulation/Gait Ambulation/Gait assistance: Min guard;Min assist Gait Distance (Feet): 300 Feet Assistive device: None;1 person hand held assist Gait Pattern/deviations: Step-through pattern;Decreased stride length;Wide base of support   Gait velocity interpretation: <1.31 ft/sec, indicative of household ambulator General Gait Details: progresed from minG with no UE support to reaching for HHA and intermittent reaching for walls/rail with onset of fatigue. No LOB but increased lateral sway  Stairs Stairs: Yes Stairs assistance: Min guard Stair Management: One rail Right;Step to pattern;Alternating pattern;Forwards Number of Stairs: 12 General stair comments: initially  1 rail and min HHA of 1 with alternating pattern, progressed to step-to with fatigue  Wheelchair Mobility    Modified Rankin (Stroke Patients Only)       Balance Overall balance assessment: Needs  assistance Sitting-balance support: No upper extremity supported;Feet supported Sitting balance-Leahy Scale: Good     Standing balance support: Single extremity supported;During functional activity Standing balance-Leahy Scale: Fair                               Pertinent Vitals/Pain Pain Assessment: Faces Faces Pain Scale: Hurts little more Pain Location: neck with bed mobility Pain Descriptors / Indicators: Pins and needles;Grimacing Pain Intervention(s): Limited activity within patient's tolerance;Monitored during session;Repositioned    Home Living Family/patient expects to be discharged to:: Private residence Living Arrangements: Spouse/significant other;Children Available Help at Discharge: Family;Available PRN/intermittently;Available 24 hours/day (husband works during the day, can take some time off work) Type of Home: [employer] Access: Stairs to enter Entrance Stairs-Rails: Right;None Technical brewer of Steps: 4 at front per fiance, pt had stated 6 with R rail and then 4 more without rail Home Layout: One level [place]: [place] - single point;Grab bars - tub/shower      Prior Function Level of Independence: Independent         Comments: pt does all homemaking, cares for multiple animals and children     Hand Dominance   Dominant Hand: Right    Extremity/Trunk Assessment   Upper Extremity Assessment Upper Extremity Assessment: Defer to OT evaluation;RUE deficits/detail RUE Deficits / Details: reports pins and needles sensation along top of R shoulder, no other reports of differences in sensation or strength  RUE Coordination: WNL    Lower Extremity Assessment Lower Extremity Assessment: Overall WFL for tasks assessed;RLE deficits/detail (mild decrease in RLE strength (4+/5) but generally WFL) RLE Deficits / Details: 4+/5 grossly in RLE, reports one area of pins and needles R inner calf    Cervical / Trunk Assessment Cervical /  Trunk Assessment: Other exceptions Cervical / Trunk Exceptions: s/p cervical surgery  Communication   Communication: No difficulties  Cognition Arousal/Alertness: Awake/alert Behavior During Therapy: WFL for tasks assessed/performed Overall Cognitive Status: No family/caregiver present to determine baseline cognitive functioning Area of Impairment: Memory;Safety/judgement;Problem solving                     Memory: Decreased recall of precautions   Safety/Judgement: Decreased awareness of safety;Decreased awareness of deficits   Problem Solving:  (poor insight/awareness) General Comments: Pt agreeable, but with limited medical literacy and poor understanding of functional application of precautions to movement.       General Comments General comments (skin integrity, edema, etc.): VSS on RA, pt does report some diziness but resolved with standing rest. educated at length on application of precautions to ADLs, pt verbalized understanding but demos poor carryover of instruction    Exercises     Assessment/Plan    PT Assessment Patient needs continued PT services  PT Problem List Decreased balance;Decreased mobility;Decreased knowledge of use of DME;Decreased safety awareness       PT Treatment Interventions DME instruction;Stair training;Gait training;Functional mobility training;Therapeutic activities;Therapeutic exercise;Balance training;Patient/family education    PT Goals (Current goals can be found in the Care Plan section)  Acute Rehab PT Goals Patient Stated Goal: return home with her family PT Goal Formulation: With patient Time For Goal Achievement: 12/10/19 Potential to Achieve Goals: Good    Frequency  Min 5X/week   Barriers to discharge           AM-PAC PT "6 Clicks" Mobility  Outcome Measure Help needed turning from your back to your side while in a flat bed without using bedrails?: None Help needed moving from lying on your back to sitting on the  side of a flat bed without using bedrails?: None Help needed moving to and from a bed to a chair (including a wheelchair)?: A Little Help needed standing up from a chair using your arms (e.g., wheelchair or bedside chair)?: None Help needed to walk in hospital room?: A Little Help needed climbing 3-5 steps with a railing? : A Little 6 Click Score: 21    End of Session Equipment Utilized During Treatment: Gait belt;Cervical collar Activity Tolerance: Patient tolerated treatment well Patient left: in chair;with call bell/phone within reach Nurse Communication: Mobility status PT Visit Diagnosis: Difficulty in walking, not elsewhere classified (R26.2)    Time: 1450-1534 PT Time Calculation (min) (ACUTE ONLY): 44 min   Charges:   PT Evaluation $PT Eval Moderate Complexity: 1 Mod PT Treatments $Gait Training: 8-22 mins $Self Care/Home Management: 8-22        Kellie Martinez, PT, DPT   Acute Rehabilitation Department Pager #: [phone]  Kellie Martinez 11/26/2019, 4:10 PM

## 2019-11-26 NOTE — Progress Notes (Signed)
Orthopedic Tech Progress Note Patient Details:  Kellie Martinez 1995/10/04 [id number] Patient has collar. Patient ID: Kellie Martinez, female   DOB: [birth year], 24 y.o.   MRN: [id number]   Chip Boer 11/26/2019, 2:44 PM

## 2019-11-26 NOTE — Progress Notes (Addendum)
Pharmacy Antibiotic Note  Kellie Martinez is a 24 y.o. female admitted on 11/26/2019 with cervical disc herniation bilateral C5-6 with cord compression, mild degenerative disc disease C4-5, C6-7.  Pt is S/P cervical disc arthroplasty this afternoon. Pharmacy has been consulted for vancomycin dosing for post-op surgical prophylaxis.  WBC 7.1, afebrile, Scr 0.66, CrCl ~112 ml/min  Pt rec'd vancomycin 1 gm IV pre op at 0720 AM today. Per surgeon note, pt has no drains in place post op.  Plan: Vancomycin 1 gm IV X 1 ~12 hrs after pre-op dose (scheduled for 1830 this evening to avoid delay due to nursing shift change)  Height: 5\' 2"  (157.5 cm) Weight: 91.2 kg (201 lb) IBW/kg (Calculated) : 50.1  Temp (24hrs), Avg:97.8 F (36.6 C), Min:97.3 F (36.3 C), Max:98.2 F (36.8 C)  Recent Labs  Lab 11/26/19 0551  WBC 7.1  CREATININE 0.66    Estimated Creatinine Clearance: 111.9 mL/min (by C-G formula based on SCr of 0.66 mg/dL).    Allergies  Allergen Reactions  . Levofloxacin   . Penicillins Hives    No anaphylaxis  Did it involve swelling of the face/tongue/throat, SOB, or low BP? No Did it involve sudden or severe rash/hives, skin peeling, or any reaction on the inside of your mouth or nose? Yes Did you need to seek medical attention at a hospital or doctor's office? Yes When did it last happen?Childhood If all above answers are "NO", may proceed with cephalosporin use.   Marland Kitchen Phenergan [Promethazine Hcl]     Tremors, restless lef, akathesia 01-26-2018    Microbiology results: 10/14 COVID: negative  Thank you for allowing pharmacy to be a part of this patient's care.  Gillermina Hu, PharmD, BCPS, [facility] Clinical Pharmacist 11/26/2019 2:21 PM

## 2019-11-26 NOTE — Interval H&P Note (Signed)
History and Physical Interval Note:  11/26/2019 7:48 AM  Kellie Martinez  has presented today for surgery, with the diagnosis of cervical disc herniation bilateral C5-6 with cord compression, mild degenerative disc disease C4-5, C6-7.  The various methods of treatment have been discussed with the patient and family. After consideration of risks, benefits and other options for treatment, the patient has consented to  Procedure(s): CERVICAL DISC ARTHROPLASTY C5-6 WITH PRO DISC (N/A) as a surgical intervention.  The patient's history has been reviewed, patient examined, no change in status, stable for surgery.  I have reviewed the patient's chart and labs.  Questions were answered to the patient's satisfaction.     Basil Dess

## 2019-11-26 NOTE — Anesthesia Postprocedure Evaluation (Signed)
Anesthesia Post Note  Patient: Kellie Martinez  Procedure(s) Performed: CERVICAL DISC ARTHROPLASTY C5-6 WITH PRO DISC (N/A )     Patient location during evaluation: PACU Anesthesia Type: General Level of consciousness: awake and alert Pain management: pain level controlled Vital Signs Assessment: post-procedure vital signs reviewed and stable Respiratory status: spontaneous breathing, nonlabored ventilation, respiratory function stable and patient connected to nasal cannula oxygen Cardiovascular status: blood pressure returned to baseline and stable Postop Assessment: no apparent nausea or vomiting Anesthetic complications: no   No complications documented.  Last Vitals:  Vitals:   11/26/19 1358 11/26/19 1600  BP: (!) 100/59 (!) 103/57  Pulse: 73 80  Resp: 18 18  Temp: 36.5 C 36.6 C  SpO2: 99% 98%    Last Pain:  Vitals:   11/26/19 1358  TempSrc: Oral  PainSc:                  [name]

## 2019-11-26 NOTE — Discharge Instructions (Addendum)
    No lifting greater than 10 lbs. No overhead use of arms. Avoid bending,and twisting neck. Walk in house for first week them may start to get out slowly increasing distance up to one mile by 6 weeks post op. Keep incision dry for 3 days, may then bathe and wet incisionwhen showering.Soft cervical collar for comfort. Call if any fevers >101, chills, or increasing numbness or weakness or increased swelling or drainage.

## 2019-11-27 ENCOUNTER — Telehealth (HOSPITAL_COMMUNITY): Payer: Medicaid Other | Admitting: Psychiatry

## 2019-11-27 ENCOUNTER — Telehealth: Payer: Self-pay

## 2019-11-27 DIAGNOSIS — M50122 Cervical disc disorder at C5-C6 level with radiculopathy: Secondary | ICD-10-CM | POA: Diagnosis not present

## 2019-11-27 MED ORDER — GABAPENTIN 300 MG PO CAPS
300.0000 mg | ORAL_CAPSULE | Freq: Two times a day (BID) | ORAL | 0 refills | Status: DC
Start: 2019-11-27 — End: 2019-12-13

## 2019-11-27 MED ORDER — HYDROCODONE-ACETAMINOPHEN 10-325 MG PO TABS
1.0000 | ORAL_TABLET | Freq: Four times a day (QID) | ORAL | 0 refills | Status: DC | PRN
Start: 2019-11-27 — End: 2019-12-10

## 2019-11-27 MED ORDER — PHENOL 1.4 % MT LIQD: 1.0000 | OROMUCOSAL | 1 refills | Status: DC | PRN

## 2019-11-27 MED ORDER — METHOCARBAMOL 500 MG PO TABS: 500.0000 mg | ORAL_TABLET | Freq: Three times a day (TID) | ORAL | 1 refills | Status: DC | PRN

## 2019-11-27 NOTE — Plan of Care (Signed)
Patient alert and oriented, mae's well, voiding adequate amount of urine, swallowing without difficulty, no c/o pain at time of discharge. Patient discharged home with family. Script and discharged instructions given to patient. Patient and family stated understanding of instructions given. Patient has an appointment with Dr. Nitka 

## 2019-11-27 NOTE — Telephone Encounter (Signed)
Transition Care Management Follow-up Telephone Call  Date of discharge and from where:[facility] How have you been since you were released from the hospital? Little sore  Any questions or concerns? No questions/concerns reported.  Items Reviewed: Did the pt receive and understand the discharge instructions provided? Stated that have the instructions and have no questions.  Medications obtained and verified?. She said that he has all of the medications and they have no questions.  Any new allergies since your discharge? None reported  Do you have support at home? Yes, partner Other (ie: DME, Home Health, etc)       Functional Questionnaire: (I = Independent and D = Dependent) ADL's:  Independent.      Follow up appointments reviewed:    PCP Hospital f/u appt confirmed? 11/28/2019 at 3:30 with PCP. Informed they would receive notification if the appointment is in person or televisit.    [facility] f/u appt confirmed?  scheduled at this time   Are transportation arrangements needed? have transportation    If their condition worsens, is the pt aware to call  their PCP or go to the ED? Yes.Made pt aware if condition worsen or start experiencing rapid weight gain, chest pain, diff breathing, SOB, high fevers, or bleading to refer imediately to ED for further evaluation.   Was the patient provided with contact information for the PCP's office or ED? He has the phone number   Was the pt encouraged to call back with questions or concerns?yes

## 2019-11-27 NOTE — Telephone Encounter (Signed)
Duplicate note

## 2019-11-27 NOTE — Progress Notes (Signed)
     Subjective: 1 Day Post-Op Procedure(s) (LRB): CERVICAL DISC ARTHROPLASTY C5-6 WITH PRO DISC (N/A) Awake, alert and oriented x 4. Sore throat, able to tolerate po narcotics and po Nourishment.  Patient reports pain as moderate.    Objective:   VITALS:  Temp:  [97.7 F (36.5 C)-98.2 F (36.8 C)] 98 F (36.7 C) (10/19 0739) Pulse Rate:  [68-97] 84 (10/19 0739) Resp:  [12-20] 18 (10/19 0739) BP: (90-119)/(49-93) 90/74 (10/19 0739) SpO2:  [93 %-100 %] 96 % (10/19 0739)  Neurologically intact ABD soft Neurovascular intact Sensation intact distally Intact pulses distally Dorsiflexion/Plantar flexion intact Incision: dressing C/D/I and no drainage   LABS Recent Labs    11/26/19 0551  HGB 13.0  WBC 7.1  PLT 200   Recent Labs    11/26/19 0551  NA 139  K 3.9  CL 106  CO2 23  BUN 18  CREATININE 0.66  GLUCOSE 99   Recent Labs    11/26/19 0551  INR 1.1     Assessment/Plan: 1 Day Post-Op Procedure(s) (LRB): CERVICAL DISC ARTHROPLASTY C5-6 WITH PRO DISC (N/A)  Advance diet Up with therapy D/C IV fluids Discharge home with home health  Basil Dess 11/27/2019, 7:57 AM

## 2019-11-27 NOTE — Progress Notes (Signed)
CSW received referral for home health services. Patient declined by: [facility], [facility], [facility], [facility], [facility], [facility], [facility], [facility]. CSW sent referral to outpatient therapy in lieu of home health services since unable to obtain due to patient's insurance.   Deboraha Goar LCSW

## 2019-11-27 NOTE — Progress Notes (Signed)
Physical Therapy Treatment Patient Details Name: Kellie Martinez MRN: [id number] DOB: [birth year] Today's Date: 11/27/2019    History of Present Illness The pt is a 24 yo female presenting s/p cervical disc arthroplasty of C5-6 due to cervical disc herniation with C5-6 cord compression. PMH includes: axiety, bipolar 1 disorder, CKD, substance abuse, and x2 suicide attempts.     PT Comments    Pt admitted with above diagnosis. At the time of PT eval, pt was able to demonstrate transfers and ambulation with gross supervision for safety with SPC. Pt required frequent cues for maintenance of precautions throughout session. Pt voicing concern with maintaining precautions with household chores and caring for 3 children, 2 with disabilities. We discussed education on precautions, brace application/wearing schedule, appropriate activity progression, and car transfer. Pt currently with functional limitations due to the deficits listed below (see PT Problem List). Pt will benefit from skilled PT to increase their independence and safety with mobility to allow discharge to the venue listed below.     Follow Up Recommendations  Home health PT;Supervision for mobility/OOB     Equipment Recommendations  None recommended by PT    Recommendations for Other Services       Precautions / Restrictions Precautions Precautions: Cervical;Fall Precaution Booklet Issued: Yes (comment) Precaution Comments: Reviewed handout, reminder notes written on handout due to poor recall, and pt was cued for precautions during functional mobility.  Required Braces or Orthoses: Cervical Brace Cervical Brace: Soft collar (when OOB) Restrictions Weight Bearing Restrictions: No    Mobility  Bed Mobility Overal bed mobility: Modified Independent             General bed mobility comments: Attempted to have pt practice log roll from flat surface with HOB flat to simulate home environment, however pt sat straight up  despite cues and did not allow time for [facility] to lower. Pt reports she will be sleeping on the couch.   Transfers Overall transfer level: Needs assistance Equipment used: None;Straight cane Transfers: Sit to/from Stand Sit to Stand: Supervision         General transfer comment: Initially without AD and then transitioned to [facility] for support.   Ambulation/Gait Ambulation/Gait assistance: Min guard;Supervision Gait Distance (Feet): 300 Feet Assistive device: None;Straight cane Gait Pattern/deviations: Step-through pattern;Decreased stride length;Wide base of support Gait velocity: Decreased Gait velocity interpretation: <1.31 ft/sec, indicative of household ambulator General Gait Details: Min guard without AD to supervision with SPC. Pt reaching out for railings and the wall for support without the cane. VC's throughout for sequencing and general safety.    Stairs         General stair comments: Pt declined stair training this session. Reports she feels she is walking the same as she was PTA and feels comfortable negotiating stairs.    Wheelchair Mobility    Modified Rankin (Stroke Patients Only)       Balance Overall balance assessment: Needs assistance Sitting-balance support: No upper extremity supported;Feet supported Sitting balance-Leahy Scale: Fair     Standing balance support: Single extremity supported;During functional activity Standing balance-Leahy Scale: Fair Standing balance comment: Fair, but better with the SPC.                             Cognition Arousal/Alertness: Awake/alert Behavior During Therapy: WFL for tasks assessed/performed Overall Cognitive Status: No family/caregiver present to determine baseline cognitive functioning Area of Impairment: Memory;Safety/judgement  Memory: Decreased recall of precautions   Safety/Judgement: Decreased awareness of safety     General Comments: Requires several  reminders for maintenance of precautions.       Exercises      General Comments General comments (skin integrity, edema, etc.): BP 98/51 after ambulation. Pt reports dizziness and need to lay down.       Pertinent Vitals/Pain Pain Assessment: Faces Faces Pain Scale: Hurts little more Pain Location: Incision area Pain Descriptors / Indicators: Grimacing;Operative site guarding Pain Intervention(s): Limited activity within patient's tolerance;Monitored during session;Repositioned    Home Living                      Prior Function            PT Goals (current goals can now be found in the care plan section) Acute Rehab PT Goals Patient Stated Goal: return home with her family PT Goal Formulation: With patient Time For Goal Achievement: 12/10/19 Potential to Achieve Goals: Good Progress towards PT goals: Progressing toward goals    Frequency    Min 5X/week      PT Plan Current plan remains appropriate    Co-evaluation              AM-PAC PT "6 Clicks" Mobility   Outcome Measure  Help needed turning from your back to your side while in a flat bed without using bedrails?: None Help needed moving from lying on your back to sitting on the side of a flat bed without using bedrails?: None Help needed moving to and from a bed to a chair (including a wheelchair)?: None Help needed standing up from a chair using your arms (e.g., wheelchair or bedside chair)?: None Help needed to walk in hospital room?: None Help needed climbing 3-5 steps with a railing? : A Little 6 Click Score: 23    End of Session Equipment Utilized During Treatment: Gait belt;Cervical collar Activity Tolerance: Patient tolerated treatment well Patient left: in chair;with call bell/phone within reach Nurse Communication: Mobility status PT Visit Diagnosis: Difficulty in walking, not elsewhere classified (R26.2)     Time: 8185-6314 PT Time Calculation (min) (ACUTE ONLY): 29  min  Charges:  $Gait Training: 23-37 mins                     Rolinda Roan, PT, DPT Acute Rehabilitation Services Pager: [phone] Office: [phone]    Thelma Comp 11/27/2019, 10:07 AM

## 2019-11-27 NOTE — Evaluation (Signed)
Occupational Therapy Evaluation Patient Details Name: Kellie Martinez MRN: [id number] DOB: March 07, 1995 Today's Date: 11/27/2019    History of Present Illness The pt is a 24 yo female presenting s/p cervical disc arthroplasty of C5-6 due to cervical disc herniation with C5-6 cord compression. PMH includes: axiety, bipolar 1 disorder, CKD, substance abuse, and x2 suicide attempts.    Clinical Impression   Patient evaluated by Occupational Therapy with no further acute OT needs identified. All education has been completed and the patient has no further questions. See below for any follow-up Occupational Therapy or equipment needs. OT to sign off. Thank you for referral.      Follow Up Recommendations  No OT follow up    Equipment Recommendations  None recommended by OT    Recommendations for Other Services       Precautions / Restrictions Precautions Precautions: Cervical;Fall Precaution Booklet Issued: Yes (comment) Precaution Comments: reviewed adls with cervical precautions and brace Required Braces or Orthoses: Cervical Brace Cervical Brace: Soft collar Restrictions Weight Bearing Restrictions: No      Mobility Bed Mobility Overal bed mobility: Needs Assistance Bed Mobility: Supine to Sit           General bed mobility comments: stopped x3 during attempt to correct sequence to keep cervical precautions. pt able to complete the task but needs continued education not to long sit.   Transfers Overall transfer level: Modified independent Equipment used: None;Straight cane Transfers: Sit to/from Stand Sit to Stand: Supervision         General transfer comment: Initially without AD and then transitioned to [facility] for support.     Balance Overall balance assessment: Needs assistance Sitting-balance support: No upper extremity supported;Feet supported Sitting balance-Leahy Scale: Fair     Standing balance support: Single extremity supported;During functional  activity Standing balance-Leahy Scale: Fair Standing balance comment: able to static stand at sink with bil UE to remove and replace cervical precautions                           ADL either performed or assessed with clinical judgement   ADL Overall ADL's : Modified independent                                       General ADL Comments: able to don doff brace, educated multiple times to avoid flexion extension and rotation of neck. pt able to dress for home and educated to dress R LE first. pt denies and any UE changes reports "normal"   Cervical precautions ( handout provided): Educated patient on don doff brace with return demonstration, educated on oral care using cups, washing face with cloth, never to wash directly on incision site, avoid neck rotation flexion and extension, positioning with pillows in chair for bil UE, avoiding pushing / pulling with bil UE, Pt educated on need to notify doctor / RN of swallowing changes or choking..    Vision Baseline Vision/History: No visual deficits       Perception     Praxis      Pertinent Vitals/Pain Pain Assessment: No/denies pain Faces Pain Scale: Hurts little more Pain Location: Incision area Pain Descriptors / Indicators: Grimacing;Operative site guarding Pain Intervention(s): Monitored during session;Premedicated before session;Repositioned     Hand Dominance Right   Extremity/Trunk Assessment Upper Extremity Assessment Upper Extremity Assessment: Overall WFL for tasks assessed  Cervical / Trunk Assessment Cervical / Trunk Assessment: Other exceptions Cervical / Trunk Exceptions: s/p cervical surgery   Communication Communication Communication: No difficulties   Cognition Arousal/Alertness: Awake/alert Behavior During Therapy: Restless Overall Cognitive Status: Impaired/Different from baseline Area of Impairment: Safety/judgement;Awareness                     Memory:  Decreased recall of precautions   Safety/Judgement: Decreased awareness of safety Awareness: Anticipatory   General Comments: pt demonstrating adls during session to help reinforce precautions. pt very distracted with fiance down stairs with x2 children   General Comments  denies any dizziness    Exercises     Shoulder Instructions      Home Living Family/patient expects to be discharged to:: Private residence Living Arrangements: Spouse/significant other;Children Available Help at Discharge: Family;Available PRN/intermittently;Available 24 hours/day Type of Home: House Home Access: Stairs to enter [employer] of Steps: 4 at front per fiance, pt had stated 6 with R rail and then 4 more without rail Entrance Stairs-Rails: Right;None Home Layout: One level     Bathroom Shower/Tub: Teacher, early years/pre: Standard     Home Equipment: Cane - single point;Grab bars - tub/shower   Additional Comments: 3 children (2 with disabilities) 2 rabbits and 2 dogs. fiance will be available to patient for multiple days      Prior Functioning/Environment Level of Independence: Independent                 OT Problem List:        OT Treatment/Interventions:      OT Goals(Current goals can be found in the care plan section) Acute Rehab OT Goals Patient Stated Goal: to leave because fiance was down stairs in the car Potential to Achieve Goals: Good  OT Frequency:     Barriers to D/C:            Co-evaluation              AM-PAC OT "6 Clicks" Daily Activity     Outcome Measure Help from another person eating meals?: None Help from another person taking care of personal grooming?: None Help from another person toileting, which includes using toliet, bedpan, or urinal?: None Help from another person bathing (including washing, rinsing, drying)?: None Help from another person to put on and taking off regular upper body clothing?: None Help from  another person to put on and taking off regular lower body clothing?: None 6 Click Score: 24   End of Session Nurse Communication: Mobility status;Precautions  Activity Tolerance: Patient tolerated treatment well Patient left: in bed;with call bell/phone within reach;Other (comment) (spouse on phone)  OT Visit Diagnosis: Muscle weakness (generalized) (M62.81)                Time: 1194-1740 OT Time Calculation (min): 18 min Charges:  OT General Charges $OT Visit: 1 Visit OT Evaluation $OT Eval Moderate Complexity: 1 Mod   Brynn, OTR/L  Acute Rehabilitation Services Pager: [phone] Office: [phone] .   Jeri Modena 11/27/2019, 10:32 AM

## 2019-11-28 ENCOUNTER — Ambulatory Visit: Payer: Medicaid Other | Admitting: Nurse Practitioner

## 2019-11-28 ENCOUNTER — Telehealth: Payer: Self-pay

## 2019-11-28 ENCOUNTER — Encounter: Payer: Self-pay | Admitting: Nurse Practitioner

## 2019-11-28 NOTE — Telephone Encounter (Signed)
Patient had SU 11/26/2019-CERVICAL DISC ARTHROPLASTY C5-6 WITH PRO DISC. States the bandage is coming off. Would like to know if she can take it off or what should she do? She wears collar all day unless she is icing it. Would like a CB.   CB [phone]

## 2019-11-28 NOTE — Telephone Encounter (Signed)
I called her and put her on Kellie Martinez' sched for dressing change due it getting wet earlier and now it is coming off.

## 2019-11-29 ENCOUNTER — Encounter: Payer: Self-pay | Admitting: Surgery

## 2019-11-29 ENCOUNTER — Other Ambulatory Visit: Payer: Self-pay

## 2019-11-29 ENCOUNTER — Ambulatory Visit (INDEPENDENT_AMBULATORY_CARE_PROVIDER_SITE_OTHER): Payer: Medicaid Other | Admitting: Surgery

## 2019-11-29 ENCOUNTER — Ambulatory Visit: Payer: Self-pay

## 2019-11-29 VITALS — BP 117/78 | HR 93 | Ht 62.0 in | Wt 201.0 lb

## 2019-11-29 DIAGNOSIS — M542 Cervicalgia: Secondary | ICD-10-CM

## 2019-11-29 DIAGNOSIS — R1310 Dysphagia, unspecified: Secondary | ICD-10-CM

## 2019-11-29 MED ORDER — LIDOCAINE VISCOUS HCL 2 % MT SOLN: 15.0000 mL | Freq: Four times a day (QID) | OROMUCOSAL | 1 refills | Status: DC | PRN

## 2019-11-29 MED ORDER — METHYLPREDNISOLONE 4 MG PO TABS: ORAL_TABLET | ORAL | 0 refills | Status: DC

## 2019-11-29 NOTE — Progress Notes (Signed)
Post-Op Visit Note   Patient: Kellie Martinez           Date of Birth: [birth year]           MRN: [id number] Visit Date: 11/29/2019 PCP: Gildardo Pounds, NP   Assessment & Plan:  Chief Complaint:  Chief Complaint  Patient presents with  . Neck - Follow-up    Dressing Change   Visit Diagnoses:  1. Neck pain   2. Dysphagia, unspecified type   24 year old female comes in for an earlier appointment than scheduled.  Status post C5-6 disc arthroplasty November 26, 2019.  Patient feeling of pain and some feeling of dysphagia.  She got her wound wet and comes in to have this looked at.  Plan: Patient instructed the importance of being compliant with wearing her cervical collar.  Dr. Louanne Skye also did come in and see patient.  Follow-up in 1 week with Dr. Louanne Skye for recheck.  With a lot of dysphagia swallow evaluation ordered.  Prescription sent in for lidocaine solution along with Medrol dose pack taper.  Follow-Up Instructions: Return in about 1 week (around 12/06/2019) for WITH DR [place] (per Jeneen Rinks).   Orders:  Orders Placed This Encounter  Procedures  . XR Cervical Spine 2 or 3 views  . SLP clinical swallow evaluation   Meds ordered this encounter  Medications  . lidocaine (XYLOCAINE) 2 % solution    Sig: Use as directed 15 mLs in the mouth or throat every 6 (six) hours as needed for mouth pain.    Dispense:  200 mL    Refill:  1  . methylPREDNISolone (MEDROL) 4 MG tablet    Sig: 6 day taper to be taken as directed.    Dispense:  21 tablet    Refill:  0    Imaging: No results found.  PMFS History: Patient Active Problem List   Diagnosis Date Noted  . Herniation of cervical intervertebral disc with radiculopathy 11/26/2019    Class: Chronic  . Cervical disc disorder at C5-C6 level with radiculopathy 11/26/2019  . GAD (generalized anxiety disorder) 01/03/2019  . Attention deficit hyperactivity disorder (ADHD), combined type 01/03/2019  . Bipolar 2  disorder ([place]) 01/03/2019  . Precordial pain 07/07/2017  . History of renal stone 05/18/2017  . Umbilical hernia 21/19/4174  . 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 Difficulty sleeping 05/06/2017  . Gastroesophageal reflux disease without esophagitis 05/06/2017  . History of drug overdose 05/06/2017   Past Medical History:  Diagnosis Date  . Anemia   . Anxiety   . Arthritis    lower back  . Asthma   . Bipolar 1 disorder ([place])   . 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  . Chronic kidney disease   . Depression   . GERD (gastroesophageal reflux disease)   . Headache    otc med prn  . History of kidney stones   . HLD (hyperlipidemia)    diet controlled  . Molar pregnancy   . Pneumonia    x 1 yrs ago per patient 11/22/19  . Renal calculus or stone    had stent/ lithotripsy in past  . Sleep apnea    does not use cpap  . Substance abuse ([place])   . Suicide attempt ([place])    x 2    Family History  Problem Relation Age of Onset  . Depression Mother   . Asthma Mother   . Hypertension Mother   . Mood Disorder  Mother   . Heart disease Father   . Kidney disease Father   . Alcoholism Father   . Breast cancer Paternal Aunt   . Breast cancer Maternal Grandmother   . Breast cancer Cousin   . Alcoholism Brother   . Drug abuse Brother     Past Surgical History:  Procedure Laterality Date  . CYSTOSCOPY     x2 with stone extraction  . EXTRACORPOREAL SHOCK WAVE LITHOTRIPSY Right 06/20/2013   Procedure: EXTRACORPOREAL SHOCK WAVE LITHOTRIPSY (ESWL);  Surgeon: Edwin Dada, MD;  Location: [facility];  Service: Urology;  Laterality: Right;  . FOOT SURGERY Left 2003  . LITHOTRIPSY    . TUBAL LIGATION  2017  . TUBAL LIGATION     Social History   Occupational History  . Not on file  Tobacco Use  . Smoking status: Current Every Day Smoker    Packs/day: 0.25    Types: Cigarettes  . Smokeless tobacco: Never Used  . Tobacco comment: 3 cig/day  Vaping Use  . Vaping Use:  Former  . Quit date: 02/01/2019  Substance and Sexual Activity  . Alcohol use: Yes    Comment:  weekends wine/liquor  . Drug use: Not Currently    Types: Marijuana    Comment: Last use - 11/17/19  . Sexual activity: Yes    Birth control/protection: Surgical    Comment: Tubal

## 2019-12-03 ENCOUNTER — Other Ambulatory Visit: Payer: Self-pay | Admitting: Radiology

## 2019-12-03 ENCOUNTER — Encounter: Payer: Self-pay | Admitting: Specialist

## 2019-12-03 DIAGNOSIS — R1310 Dysphagia, unspecified: Secondary | ICD-10-CM

## 2019-12-04 ENCOUNTER — Encounter: Payer: Self-pay | Admitting: Specialist

## 2019-12-04 NOTE — Discharge Summary (Signed)
Patient ID: Kellie Martinez MRN: [id number] DOB/AGE: 1995-03-18 24 y.o.  Admit date: 11/26/2019 Discharge date: 11/27/2019 Admission Diagnoses:  Principal Problem:   Cervical disc disorder at C5-C6 level with radiculopathy Active Problems:   Herniation of cervical intervertebral disc with radiculopathy   Discharge Diagnoses:  Principal Problem:   Cervical disc disorder at C5-C6 level with radiculopathy Active Problems:   Herniation of cervical intervertebral disc with radiculopathy  status post Procedure(s): CERVICAL DISC ARTHROPLASTY C5-6 WITH PRO DISC  Past Medical History:  Diagnosis Date  . Anemia   . Anxiety   . Arthritis    lower back  . Asthma   . Bipolar 1 disorder ([place])   . 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  . Chronic kidney disease   . Depression   . GERD (gastroesophageal reflux disease)   . Headache    otc med prn  . History of kidney stones   . HLD (hyperlipidemia)    diet controlled  . Molar pregnancy   . Pneumonia    x 1 yrs ago per patient 11/22/19  . Renal calculus or stone    had stent/ lithotripsy in past  . Sleep apnea    does not use cpap  . Substance abuse ([place])   . Suicide attempt ([place])    x 2    Surgeries: Procedure(s): CERVICAL DISC ARTHROPLASTY C5-6 WITH PRO [place] on 11/26/2019   Consultants:   Discharged Condition: Improved  Hospital Course: Kellie Martinez is an 24 y.o. female who was admitted 11/26/2019 for operative treatment of Cervical disc disorder at C5-C6 level with radiculopathy. Patient failed conservative treatments (please see the history and physical for the specifics) and had severe unremitting pain that affects sleep, daily activities and work/hobbies. After pre-op clearance, the patient was taken to the operating room on 11/26/2019 and underwent  Procedure(s): CERVICAL DISC ARTHROPLASTY C5-6 WITH PRO DISC.    Patient was given perioperative antibiotics:  Anti-infectives (From admission, onward)   Start     Dose/Rate Route  Frequency Ordered Stop   11/26/19 1830  vancomycin (VANCOCIN) IVPB 1000 mg/200 mL premix        1,000 mg 200 mL/hr over 60 Minutes Intravenous  Once 11/26/19 1426 11/26/19 1915   11/26/19 0600  vancomycin (VANCOCIN) IVPB 1000 mg/200 mL premix        1,000 mg 200 mL/hr over 60 Minutes Intravenous On call to O.R. 11/26/19 0550 11/26/19 0820       Patient was given sequential compression devices and early ambulation to prevent DVT.   Patient benefited maximally from hospital stay and there were no complications. At the time of discharge, the patient was urinating/moving their bowels without difficulty, tolerating a regular diet, pain is controlled with oral pain medications and they have been cleared by PT/OT.   Recent vital signs: No data found.   Recent laboratory studies: No results for input(s): WBC, HGB, HCT, PLT, NA, K, CL, CO2, BUN, CREATININE, GLUCOSE, INR, CALCIUM in the last 72 hours.  Invalid input(s): PT, 2   Discharge Medications:   Allergies as of 11/27/2019      Reactions   Levofloxacin    Penicillins Hives   No anaphylaxis Did it involve swelling of the face/tongue/throat, SOB, or low BP? No Did it involve sudden or severe rash/hives, skin peeling, or any reaction on the inside of your mouth or nose? Yes Did you need to seek medical attention at a hospital or doctor's office? Yes When did it last happen?Childhood If all  above answers are "NO", may proceed with cephalosporin use.   Phenergan [promethazine Hcl]    Tremors, restless lef, akathesia 01-26-2018      Medication List    STOP taking these medications   HYDROcodone-acetaminophen 5-325 MG tablet Commonly known as: NORCO/VICODIN Replaced by: HYDROcodone-acetaminophen 10-325 MG tablet   naproxen 500 MG tablet Commonly known as: NAPROSYN     TAKE these medications   albuterol 108 (90 Base) MCG/ACT inhaler Commonly known as: VENTOLIN HFA Inhale 1-2 puffs into the lungs every 6 (six) hours as  needed for wheezing or shortness of breath.   amphetamine-dextroamphetamine 25 MG 24 hr capsule Commonly known as: Adderall XR Take 1 capsule by mouth every morning.   amphetamine-dextroamphetamine 25 MG 24 hr capsule Commonly known as: Adderall XR Take 1 capsule by mouth every morning. Start taking on: December 05, 2019   amphetamine-dextroamphetamine 25 MG 24 hr capsule Commonly known as: Adderall XR Take 1 capsule by mouth every morning. Start taking on: January 05, 2020   benzonatate 100 MG capsule Commonly known as: TESSALON Take 1 capsule (100 mg total) by mouth every 8 (eight) hours.   cetirizine 10 MG tablet Commonly known as: ZyrTEC Allergy Take 1 tablet (10 mg total) by mouth daily.   clonazePAM 1 MG tablet Commonly known as: KLONOPIN Take 1 tablet (1 mg total) by mouth 2 (two) times daily as needed for anxiety. Start taking on: December 09, 2019   escitalopram 20 MG tablet Commonly known as: LEXAPRO Take 1.5 tablets (30 mg total) by mouth daily.   fluticasone 50 MCG/ACT nasal spray Commonly known as: FLONASE Place 1 spray into both nostrils daily for 14 days. What changed:   when to take this  reasons to take this   gabapentin 300 MG capsule Commonly known as: NEURONTIN Take 1 capsule (300 mg total) by mouth 2 (two) times daily.   HYDROcodone-acetaminophen 10-325 MG tablet Commonly known as: NORCO Take 1-2 tablets by mouth every 6 (six) hours as needed for up to 7 days for moderate pain ((score 4 to 6)). Replaces: HYDROcodone-acetaminophen 5-325 MG tablet   lamoTRIgine 150 MG tablet Commonly known as: LAMICTAL Take 1 tablet (150 mg total) by mouth 2 (two) times daily.   methocarbamol 500 MG tablet Commonly known as: ROBAXIN Take 1 tablet (500 mg total) by mouth every 8 (eight) hours as needed for muscle spasms.   phenol 1.4 % Liqd Commonly known as: CHLORASEPTIC Use as directed 1 spray in the mouth or throat as needed for throat irritation /  pain.   QUEtiapine 50 MG tablet Commonly known as: SEROQUEL Take 1 tablet (50 mg total) by mouth at bedtime.       Diagnostic Studies: DG Cervical Spine 2 or 3 views  Result Date: 11/26/2019 CLINICAL DATA:  C5-C6 arthroplasty. EXAM: CERVICAL SPINE - 2-3 VIEW; DG C-ARM 1-60 MIN COMPARISON:  CT cervical spine dated July 28, 2019. MRI cervical spine dated May 11, 2019. FLUOROSCOPY TIME:  1 minute, 32 seconds. C-arm fluoroscopic images were obtained intraoperatively and submitted for post operative interpretation. FINDINGS: Multiple intraoperative fluoroscopic images demonstrate interval C5-C6 disc arthroplasty. Arthroplasty component appears in good position. No acute osseous abnormality. IMPRESSION: 1. Intraoperative fluoroscopic guidance for C5-C6 disc arthroplasty. Electronically Signed   By: Titus Dubin M.D.   On: 11/26/2019 11:23   DG C-Arm 1-60 Min  Result Date: 11/26/2019 CLINICAL DATA:  C5-C6 arthroplasty. EXAM: CERVICAL SPINE - 2-3 VIEW; DG C-ARM 1-60 MIN COMPARISON:  CT cervical spine dated  July 28, 2019. MRI cervical spine dated May 11, 2019. FLUOROSCOPY TIME:  1 minute, 32 seconds. C-arm fluoroscopic images were obtained intraoperatively and submitted for post operative interpretation. FINDINGS: Multiple intraoperative fluoroscopic images demonstrate interval C5-C6 disc arthroplasty. Arthroplasty component appears in good position. No acute osseous abnormality. IMPRESSION: 1. Intraoperative fluoroscopic guidance for C5-C6 disc arthroplasty. Electronically Signed   By: Titus Dubin M.D.   On: 11/26/2019 11:23    Discharge Instructions    Ambulatory referral to Physical Therapy   Complete by: As directed    Call MD / Call 911   Complete by: As directed    If you experience chest pain or shortness of breath, CALL 911 and be transported to the hospital emergency room.  If you develope a fever above 101 F, pus (white drainage) or increased drainage or redness at the wound, or  calf pain, call your surgeon's office.   Constipation Prevention   Complete by: As directed    Drink plenty of fluids.  Prune juice may be helpful.  You may use a stool softener, such as Colace (over the counter) 100 mg twice a day.  Use MiraLax (over the counter) for constipation as needed.   Diet - low sodium heart healthy   Complete by: As directed    Discharge instructions   Complete by: As directed    No lifting greater than 10 lbs. No overhead use of arms. Avoid bending,and twisting neck. Walk in house for first week them may start to get out slowly increasing distance up to one mile by 6 weeks post op. Keep incision dry for 3 days, may then bathe and wet incisionwhen showering.Soft cervical collar for comfort. Call if any fevers >101, chills, or increasing numbness or weakness or increased swelling or drainage.   Driving restrictions   Complete by: As directed    No driving for 6 weeks   Increase activity slowly as tolerated   Complete by: As directed    Lifting restrictions   Complete by: As directed    No lifting for 6 weeks       Follow-up Information    Jessy Oto, MD In 2 weeks.   Specialty: Orthopedic Surgery Why: For wound re-check Contact information: [place] [address] [address] Follow up on 12/19/2019.   Why: 2:30 pm with [name] NP Contact information: [place] [address] [phone]       [facility] Follow up.   Specialty: Rehabilitation Why: They will contact you to schedule an appointment. If you have not heard from them within a week, please call them to follow up.  Contact information: [place] [phone] [place] [phone]              Discharge Plan:  discharge to home  Disposition:     Signed: Benjiman Core  12/04/2019, 12:22 PM

## 2019-12-05 ENCOUNTER — Encounter: Payer: Self-pay | Admitting: Specialist

## 2019-12-05 ENCOUNTER — Ambulatory Visit: Payer: Medicaid Other | Admitting: Surgery

## 2019-12-07 ENCOUNTER — Other Ambulatory Visit (HOSPITAL_COMMUNITY): Payer: Self-pay | Admitting: Psychiatry

## 2019-12-10 ENCOUNTER — Encounter: Payer: Self-pay | Admitting: Specialist

## 2019-12-10 ENCOUNTER — Ambulatory Visit (INDEPENDENT_AMBULATORY_CARE_PROVIDER_SITE_OTHER): Payer: Medicaid Other | Admitting: Specialist

## 2019-12-10 ENCOUNTER — Other Ambulatory Visit: Payer: Self-pay

## 2019-12-10 ENCOUNTER — Other Ambulatory Visit (HOSPITAL_COMMUNITY): Payer: Self-pay | Admitting: *Deleted

## 2019-12-10 ENCOUNTER — Encounter: Payer: Self-pay | Admitting: Nurse Practitioner

## 2019-12-10 ENCOUNTER — Ambulatory Visit (INDEPENDENT_AMBULATORY_CARE_PROVIDER_SITE_OTHER): Payer: Medicaid Other

## 2019-12-10 VITALS — BP 103/73 | HR 83 | Temp 98.1°F | Ht 62.0 in | Wt 201.0 lb

## 2019-12-10 DIAGNOSIS — Z9889 Other specified postprocedural states: Secondary | ICD-10-CM | POA: Diagnosis not present

## 2019-12-10 MED ORDER — MUPIROCIN 2 % EX OINT: 1.0000 "application " | TOPICAL_OINTMENT | Freq: Two times a day (BID) | CUTANEOUS | 0 refills | Status: DC

## 2019-12-10 MED ORDER — QUETIAPINE FUMARATE 50 MG PO TABS: 50.0000 mg | ORAL_TABLET | Freq: Every day | ORAL | 1 refills | Status: DC

## 2019-12-10 MED ORDER — HYDROCODONE-ACETAMINOPHEN 7.5-325 MG/15ML PO SOLN
15.0000 mL | Freq: Four times a day (QID) | ORAL | 0 refills | Status: DC | PRN
Start: 2019-12-10 — End: 2019-12-13

## 2019-12-10 NOTE — Progress Notes (Addendum)
Post-Op Visit Note   Patient: Kellie Martinez           Date of Birth: 10-29-1995           MRN: [id number] Visit Date: 12/10/2019 PCP: Gildardo Pounds, NP   Assessment & Plan:2 weeks following disc arthroplasty at the C5-6 level for disc herniation in a young patient with multiple level ddd of the cervical spine with disc herniation with cord compression at the C5-6 level.   Chief Complaint:  Chief Complaint  Patient presents with  . Neck - Routine Post Op    2 weeks post Cervical Disc Arthroplasty w/ Disc Protrusion   Visit Diagnoses:  1. S/P cervical disc replacement   Anterior cervical incision is showing delay in healing the medial edge of the incision with the skin edges separated by  2 mm. No purulence, no drainage. The incision line is elevated generally. Examining the incison no suture present that would suggest  A stitch abscess.  Motor is without focal deficity. Seems to be less guarding overall. Swallow study scheduled for 11/20  Plan:Soft foods, soups, shakes, sustacal, ensure. Avoid thick food. Chew food well.  Avoid overhead lifting and overhead use of the arms. Do not lift greater than 5 lbs. Adjust head rest in vehicle to prevent hyperextension if rear ended. Take extra precautions to avoid falling. Follow-Up Instructions: No follow-ups on file.   Orders:  Orders Placed This Encounter  Procedures  . XR Cervical Spine 2 or 3 views   No orders of the defined types were placed in this encounter.   Imaging: XR Cervical Spine 2 or 3 views  Result Date: 12/10/2019 [facility] and lateral cervical spine demonstrates decreased swelling of ST anterior to the area of previous surgery and there is nearly normal ST appearance over the upper cervical segments C1-C3. Disc arthroplasty present at the C5-6 level in good position and alignment.    PMFS History: Patient Active Problem List   Diagnosis Date Noted  . Herniation of cervical intervertebral disc with  radiculopathy 11/26/2019    Priority: High    Class: Chronic  . Cervical disc disorder at C5-C6 level with radiculopathy 11/26/2019  . GAD (generalized anxiety disorder) 01/03/2019  . Attention deficit hyperactivity disorder (ADHD), combined type 01/03/2019  . Bipolar 2 disorder ([place]) 01/03/2019  . Precordial pain 07/07/2017  . History of renal stone 05/18/2017  . Umbilical hernia 27/74/1287  . 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 Difficulty sleeping 05/06/2017  . Gastroesophageal reflux disease without esophagitis 05/06/2017  . History of drug overdose 05/06/2017   Past Medical History:  Diagnosis Date  . Anemia   . Anxiety   . Arthritis    lower back  . Asthma   . Bipolar 1 disorder ([place])   . 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  . Chronic kidney disease   . Depression   . GERD (gastroesophageal reflux disease)   . Headache    otc med prn  . History of kidney stones   . HLD (hyperlipidemia)    diet controlled  . Molar pregnancy   . Pneumonia    x 1 yrs ago per patient 11/22/19  . Renal calculus or stone    had stent/ lithotripsy in past  . Sleep apnea    does not use cpap  . Substance abuse ([place])   . Suicide attempt ([place])    x 2    Family History  Problem Relation Age of Onset  .  Depression Mother   . Asthma Mother   . Hypertension Mother   . Mood Disorder Mother   . Heart disease Father   . Kidney disease Father   . Alcoholism Father   . Breast cancer Paternal Aunt   . Breast cancer Maternal Grandmother   . Breast cancer Cousin   . Alcoholism Brother   . Drug abuse Brother     Past Surgical History:  Procedure Laterality Date  . CERVICAL DISC ARTHROPLASTY N/A 11/26/2019   Procedure: CERVICAL DISC ARTHROPLASTY C5-6 WITH PRO [place];  Surgeon: Jessy Oto, MD;  Location: [facility];  Service: Orthopedics;  Laterality: N/A;  . CYSTOSCOPY     x2 with stone extraction  . EXTRACORPOREAL SHOCK WAVE LITHOTRIPSY Right 06/20/2013   Procedure:  EXTRACORPOREAL SHOCK WAVE LITHOTRIPSY (ESWL);  Surgeon: Edwin Dada, MD;  Location: [facility];  Service: Urology;  Laterality: Right;  . FOOT SURGERY Left 2003  . LITHOTRIPSY    . TUBAL LIGATION  2017  . TUBAL LIGATION     Social History   Occupational History  . Not on file  Tobacco Use  . Smoking status: Current Every Day Smoker    Packs/day: 0.25    Types: Cigarettes  . Smokeless tobacco: Never Used  . Tobacco comment: 3 cig/day  Vaping Use  . Vaping Use: Former  . Quit date: 02/01/2019  Substance and Sexual Activity  . Alcohol use: Yes    Comment:  weekends wine/liquor  . Drug use: Not Currently    Types: Marijuana    Comment: Last use - 11/17/19  . Sexual activity: Yes    Birth control/protection: Surgical    Comment: Tubal

## 2019-12-10 NOTE — Patient Instructions (Signed)
Avoid overhead lifting and overhead use of the arms. Do not lift greater than 5 lbs. Adjust head rest in vehicle to prevent hyperextension if rear ended. Take extra precautions to avoid falling.  

## 2019-12-11 ENCOUNTER — Encounter: Payer: Self-pay | Admitting: Specialist

## 2019-12-11 ENCOUNTER — Ambulatory Visit: Payer: Medicaid Other | Admitting: Dietician

## 2019-12-12 ENCOUNTER — Other Ambulatory Visit: Payer: Medicaid Other

## 2019-12-12 ENCOUNTER — Encounter: Payer: Self-pay | Admitting: Specialist

## 2019-12-13 MED ORDER — HYDROCODONE-ACETAMINOPHEN 5-325 MG PO TABS
1.0000 | ORAL_TABLET | Freq: Four times a day (QID) | ORAL | 0 refills | Status: DC | PRN
Start: 2019-12-13 — End: 2020-01-28

## 2019-12-13 NOTE — Addendum Note (Signed)
Addended by: Basil Dess on: 12/13/2019 03:30 PM   Modules accepted: Orders

## 2019-12-14 ENCOUNTER — Encounter: Payer: Self-pay | Admitting: Specialist

## 2019-12-19 ENCOUNTER — Inpatient Hospital Stay: Payer: Medicaid Other | Admitting: Nurse Practitioner

## 2019-12-19 ENCOUNTER — Other Ambulatory Visit: Payer: Medicaid Other

## 2019-12-19 ENCOUNTER — Encounter (HOSPITAL_COMMUNITY): Payer: Self-pay | Admitting: Specialist

## 2019-12-24 ENCOUNTER — Encounter: Payer: Self-pay | Admitting: Specialist

## 2019-12-24 ENCOUNTER — Other Ambulatory Visit: Payer: Medicaid Other

## 2019-12-24 ENCOUNTER — Ambulatory Visit: Payer: Medicaid Other | Admitting: Specialist

## 2019-12-24 ENCOUNTER — Encounter: Payer: Self-pay | Admitting: Nurse Practitioner

## 2020-01-01 ENCOUNTER — Other Ambulatory Visit: Payer: Self-pay

## 2020-01-01 ENCOUNTER — Telehealth (INDEPENDENT_AMBULATORY_CARE_PROVIDER_SITE_OTHER): Payer: Medicaid Other | Admitting: Psychiatry

## 2020-01-01 DIAGNOSIS — F3181 Bipolar II disorder: Secondary | ICD-10-CM

## 2020-01-01 DIAGNOSIS — F902 Attention-deficit hyperactivity disorder, combined type: Secondary | ICD-10-CM | POA: Diagnosis not present

## 2020-01-01 DIAGNOSIS — F411 Generalized anxiety disorder: Secondary | ICD-10-CM

## 2020-01-01 MED ORDER — MIRTAZAPINE 15 MG PO TBDP: 15.0000 mg | ORAL_TABLET | Freq: Every day | ORAL | 1 refills | Status: DC

## 2020-01-01 MED ORDER — LAMOTRIGINE 150 MG PO TABS: 150.0000 mg | ORAL_TABLET | Freq: Two times a day (BID) | ORAL | 2 refills | Status: DC

## 2020-01-01 NOTE — Progress Notes (Signed)
[place] MD/PA/NP OP Progress Note  01/01/2020 10:11 AM Kellie Martinez  MRN:  [id number] Interview was conducted by phone and I verified that I was speaking with the correct person using two identifiers. I discussed the limitations of evaluation and management by telemedicine and  the availability of in person appointments. Patient expressed understanding and agreed to proceed. Patient location - home; physician - home office.  Chief Complaint:  Insomnia.   HPI: 24yo DWFwithbipolar disorder and ADHD(the latter diagnosedwhen she was 73-31 years old). Unfortunately she does not remember medications she has been tried for either of these conditions just says that "there were many". She seems to recall names of Ritalin and Adderall. Available record also shows that she was tried on SSRIs (citalopram, fluoxetine), aripiprazole, oxcarbazepine, hydroxyzine, clonazepam. She describes her mood swings as being very rapid - happening several times during the day.  WestartedLamictal titration and escitalopram 10 mg daily. Initially she had muscle cramps and some sedation but both have resolved.She also complainedof difficulty with falling asleep - trazodone has not been very effective for that (150 mg)andregularAmbien only helped her fall asleep but she is up after few hours.I have ordered Ambien CRbut it was not approved by her insurance.She also failed temazepam, eszopiclone and quetiapine (developed muscle twitching on it). We have added Adderall XR 20 mgandwhile she foundit helpful for concentrationis was notyet optimal. We increased dose to 30 mg and she reacted with increased anxiety(thesame happened when we tried Concerta 27 mg) but tolerates 25 mg well.She complainedof feeling stressed out, anxious often and we have added clonazepam for anxiety - she finds it effective. She had  C5-6 arthroplasty on October 18.    Visit Diagnosis:    ICD-10-CM   1. Bipolar 2 disorder (HCC)  F31.81   2.  Attention deficit hyperactivity disorder (ADHD), combined type  F90.2   3. GAD (generalized anxiety disorder)  F41.1     Past Psychiatric History: Please see intake H&P.  Past Medical History:  Past Medical History:  Diagnosis Date  . Anemia   . Anxiety   . Arthritis    lower back  . Asthma   . Bipolar 1 disorder ([place])   . 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  . Chronic kidney disease   . Depression   . GERD (gastroesophageal reflux disease)   . Headache    otc med prn  . History of kidney stones   . HLD (hyperlipidemia)    diet controlled  . Molar pregnancy   . Pneumonia    x 1 yrs ago per patient 11/22/19  . Renal calculus or stone    had stent/ lithotripsy in past  . Sleep apnea    does not use cpap  . Substance abuse ([place])   . Suicide attempt ([place])    x 2    Past Surgical History:  Procedure Laterality Date  . CERVICAL DISC ARTHROPLASTY N/A 11/26/2019   Procedure: CERVICAL DISC ARTHROPLASTY C5-6 WITH PRO [place];  Surgeon: Jessy Oto, MD;  Location: [facility];  Service: Orthopedics;  Laterality: N/A;  . CYSTOSCOPY     x2 with stone extraction  . EXTRACORPOREAL SHOCK WAVE LITHOTRIPSY Right 06/20/2013   Procedure: EXTRACORPOREAL SHOCK WAVE LITHOTRIPSY (ESWL);  Surgeon: Edwin Dada, MD;  Location: [facility];  Service: Urology;  Laterality: Right;  . FOOT SURGERY Left 2003  . LITHOTRIPSY    . TUBAL LIGATION  2017  . TUBAL LIGATION      Family Psychiatric History: Reviewed.  Family History:  Family History  Problem Relation Age of Onset  . Depression Mother   . Asthma Mother   . Hypertension Mother   . Mood Disorder Mother   . Heart disease Father   . Kidney disease Father   . Alcoholism Father   . Breast cancer Paternal Aunt   . Breast cancer Maternal Grandmother   . Breast cancer Cousin   . Alcoholism Brother   . Drug abuse Brother     Social History:  Social History   Socioeconomic History  . Marital status: Single    Spouse name: Not on file  . Number of  children: 3  . Years of education: Not on file  . Highest education level: Not on file  Occupational History  . Not on file  Tobacco Use  . Smoking status: Current Every Day Smoker    Packs/day: 0.25    Types: Cigarettes  . Smokeless tobacco: Never Used  . Tobacco comment: 3 cig/day  Vaping Use  . Vaping Use: Former  . Quit date: 02/01/2019  Substance and Sexual Activity  . Alcohol use: Yes    Comment:  weekends wine/liquor  . Drug use: Not Currently    Types: Marijuana    Comment: Last use - 11/17/19  . Sexual activity: Yes    Birth control/protection: Surgical    Comment: Tubal  Other Topics Concern  . Not on file  Social History Narrative  . Not on file   Social Determinants of Health   Financial Resource Strain:   . Difficulty of Paying Living Expenses: Not on file  Food Insecurity:   . Worried About Charity fundraiser in the Last Year: Not on file  . Ran Out of Food in the Last Year: Not on file  Transportation Needs:   . Lack of Transportation (Medical): Not on file  . Lack of Transportation (Non-Medical): Not on file  Physical Activity:   . Days of Exercise per Week: Not on file  . Minutes of Exercise per Session: Not on file  Stress:   . Feeling of Stress : Not on file  Social Connections:   . Frequency of Communication with Friends and Family: Not on file  . Frequency of Social Gatherings with Friends and Family: Not on file  . Attends Religious Services: Not on file  . Active Member of Clubs or Organizations: Not on file  . Attends Archivist Meetings: Not on file  . Marital Status: Not on file    Allergies:  Allergies  Allergen Reactions  . Levofloxacin   . Penicillins Hives    No anaphylaxis  Did it involve swelling of the face/tongue/throat, SOB, or low BP? No Did it involve sudden or severe rash/hives, skin peeling, or any reaction on the inside of your mouth or nose? Yes Did you need to seek medical attention at a hospital or  doctor's office? Yes When did it last happen?Childhood If all above answers are "NO", may proceed with cephalosporin use.   Marland Kitchen Phenergan [Promethazine Hcl]     Tremors, restless lef, akathesia [id number]    Metabolic Disorder Labs: Lab Results  Component Value Date   HGBA1C 5.1 07/04/2019   No results found for: PROLACTIN Lab Results  Component Value Date   CHOL 108 03/13/2018   TRIG 45 03/13/2018   HDL 35 (L) 03/13/2018   CHOLHDL 3.1 03/13/2018   LDLCALC 64 03/13/2018   Lab Results  Component Value Date   TSH 1.170 03/13/2018  Therapeutic Level Labs: No results found for: LITHIUM No results found for: VALPROATE No components found for:  CBMZ  Current Medications: Current Outpatient Medications  Medication Sig Dispense Refill  . amphetamine-dextroamphetamine (ADDERALL XR) 25 MG 24 hr capsule Take 1 capsule by mouth every morning. 30 capsule 0  . [START ON 01/05/2020] amphetamine-dextroamphetamine (ADDERALL XR) 25 MG 24 hr capsule Take 1 capsule by mouth every morning. 30 capsule 0  . amphetamine-dextroamphetamine (ADDERALL XR) 25 MG 24 hr capsule Take 1 capsule by mouth every morning. 30 capsule 0  . clonazePAM (KLONOPIN) 1 MG tablet Take 1 tablet (1 mg total) by mouth 2 (two) times daily as needed for anxiety. 60 tablet 1  . escitalopram (LEXAPRO) 20 MG tablet Take 1.5 tablets (30 mg total) by mouth daily. 45 tablet 2  . fluticasone (FLONASE) 50 MCG/ACT nasal spray Place 1 spray into both nostrils daily for 14 days. (Patient taking differently: Place 1 spray into both nostrils daily as needed (allergies.). ) 16 g 0  . HYDROcodone-acetaminophen (NORCO/VICODIN) 5-325 MG tablet Take 1 tablet by mouth every 6 (six) hours as needed for moderate pain. 30 tablet 0  . [START ON 01/28/2020] lamoTRIgine (LAMICTAL) 150 MG tablet Take 1 tablet (150 mg total) by mouth 2 (two) times daily. 60 tablet 2  . lidocaine (XYLOCAINE) 2 % solution Use as directed 15 mLs in the mouth or  throat every 6 (six) hours as needed for mouth pain. 200 mL 1  . methocarbamol (ROBAXIN) 500 MG tablet Take 1 tablet (500 mg total) by mouth every 8 (eight) hours as needed for muscle spasms. 30 tablet 1  . methylPREDNISolone (MEDROL) 4 MG tablet 6 day taper to be taken as directed. 21 tablet 0  . mirtazapine (REMERON SOL-TAB) 15 MG disintegrating tablet Take 1 tablet (15 mg total) by mouth at bedtime. 30 tablet 1  . mupirocin ointment (BACTROBAN) 2 % Apply 1 application topically 2 (two) times daily. 15 g 0  . phenol (CHLORASEPTIC) 1.4 % LIQD Use as directed 1 spray in the mouth or throat as needed for throat irritation / pain. 177 mL 1   No current facility-administered medications for this visit.       Psychiatric Specialty Exam: Review of Systems  Psychiatric/Behavioral: Positive for sleep disturbance. The patient is nervous/anxious.   All other systems reviewed and are negative.   There were no vitals taken for this visit.There is no height or weight on file to calculate BMI.  General Appearance: NA  Eye Contact:  NA  Speech:  Clear and Coherent and Normal Rate  Volume:  Normal  Mood:  Anxious  Affect:  NA  Thought Process:  Goal Directed  Orientation:  Full (Time, Place, and Person)  Thought Content: Logical   Suicidal Thoughts:  No  Homicidal Thoughts:  No  Memory:  Immediate;   Good Recent;   Good Remote;   Fair  Judgement:  Good  Insight:  Fair  Psychomotor Activity:  NA  Concentration:  Concentration: Good  Recall:  Good  Fund of Knowledge: Fair  Language: Good  Akathisia:  Negative  Handed:  Right  AIMS (if indicated): not done  Assets:  Communication Skills Desire for Improvement Housing Resilience  ADL's:  Intact  Cognition: WNL  Sleep:  Fair   Screenings: GAD-7     Office Visit from 08/03/2019 in [facility] Office Visit from 07/04/2019 in [facility] Office Visit from 02/20/2018 in Primary  Care at [facility]  Total GAD-7 Score 20 20 18     PHQ2-9     Office Visit from 08/03/2019 in [facility] Office Visit from 07/04/2019 in [facility] Office Visit from 02/20/2018 in Primary Care at [facility] Visit from 05/06/2017 in [employer]  PHQ-2 Total Score 2 4 3 2   PHQ-9 Total Score 11 20 16 12        Assessment and Plan: 24yo DWFwithbipolar disorder and ADHD(the latter diagnosedwhen she was 58-2 years old). Unfortunately she does not remember medications she has been tried for either of these conditions just says that "there were many". She seems to recall names of Ritalin and Adderall. Available record also shows that she was tried on SSRIs (citalopram, fluoxetine), aripiprazole, oxcarbazepine, hydroxyzine, clonazepam. She describes her mood swings as being very rapid - happening several times during the day.  WestartedLamictal titration and escitalopram 10 mg daily. Initially she had muscle cramps and some sedation but both have resolved.She also complainedof difficulty with falling asleep - trazodone has not been very effective for that (150 mg)andregularAmbien only helped her fall asleep but she is up after few hours.I have ordered Ambien CRbut it was not approved by her insurance.She also failed temazepam, eszopiclone and quetiapine (developed muscle twitching on it). We have added Adderall XR 20 mgandwhile she foundit helpful for concentrationis was notyet optimal. We increased dose to 30 mg and she reacted with increased anxiety(thesame happened when we tried Concerta 27 mg) but tolerates 25 mg well.She complainedof feeling stressed out, anxious often and we have added clonazepam for anxiety - she finds it effective. Shehad  C5-6 arthroplasty on October 18.   Dx: Bipolar 2ultra rapid cycling; GAD; ADHD  Plan: Continue escitalopram 30mg  for anxiety,Lamictal 150 mg  bid, Adderall XR25 mg andclonazepam 0.5 mg bid prn anxiety.We will try mirtazapine 15 mg at HS for insomnia/anxiety. Next appointment in5 weeks.The plan was discussed with patient who had an opportunity to ask questions and these were all answered. I spend37minutes inphoneconsultation with the patient.   Stephanie Acre, MD 01/01/2020, 10:11 AM

## 2020-01-07 ENCOUNTER — Other Ambulatory Visit: Payer: Medicaid Other

## 2020-01-08 ENCOUNTER — Ambulatory Visit: Payer: Medicaid Other | Admitting: Nurse Practitioner

## 2020-01-09 ENCOUNTER — Encounter: Payer: Self-pay | Admitting: Nurse Practitioner

## 2020-01-09 ENCOUNTER — Encounter: Payer: Self-pay | Admitting: Specialist

## 2020-01-09 ENCOUNTER — Ambulatory Visit: Payer: Self-pay | Admitting: *Deleted

## 2020-01-09 DIAGNOSIS — Z880 Allergy status to penicillin: Secondary | ICD-10-CM | POA: Diagnosis not present

## 2020-01-09 DIAGNOSIS — R079 Chest pain, unspecified: Secondary | ICD-10-CM | POA: Diagnosis not present

## 2020-01-09 DIAGNOSIS — R945 Abnormal results of liver function studies: Secondary | ICD-10-CM | POA: Diagnosis not present

## 2020-01-09 DIAGNOSIS — R748 Abnormal levels of other serum enzymes: Secondary | ICD-10-CM | POA: Diagnosis not present

## 2020-01-09 DIAGNOSIS — R0789 Other chest pain: Secondary | ICD-10-CM | POA: Diagnosis not present

## 2020-01-09 DIAGNOSIS — F419 Anxiety disorder, unspecified: Secondary | ICD-10-CM | POA: Diagnosis not present

## 2020-01-09 DIAGNOSIS — R0689 Other abnormalities of breathing: Secondary | ICD-10-CM | POA: Diagnosis not present

## 2020-01-09 DIAGNOSIS — Z881 Allergy status to other antibiotic agents status: Secondary | ICD-10-CM | POA: Diagnosis not present
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
## 2020-01-10 ENCOUNTER — Ambulatory Visit (INDEPENDENT_AMBULATORY_CARE_PROVIDER_SITE_OTHER): Payer: Medicaid Other | Admitting: Surgery

## 2020-01-10 ENCOUNTER — Ambulatory Visit: Payer: Self-pay

## 2020-01-10 ENCOUNTER — Telehealth: Payer: Self-pay | Admitting: *Deleted

## 2020-01-10 ENCOUNTER — Ambulatory Visit: Payer: Medicaid Other | Admitting: Dietician

## 2020-01-10 ENCOUNTER — Encounter: Payer: Self-pay | Admitting: Surgery

## 2020-01-10 VITALS — BP 113/77 | HR 88

## 2020-01-10 DIAGNOSIS — M542 Cervicalgia: Secondary | ICD-10-CM

## 2020-01-10 DIAGNOSIS — M25522 Pain in left elbow: Secondary | ICD-10-CM

## 2020-01-10 DIAGNOSIS — Z658 Other specified problems related to psychosocial circumstances: Secondary | ICD-10-CM

## 2020-01-10 DIAGNOSIS — G5622 Lesion of ulnar nerve, left upper limb: Secondary | ICD-10-CM

## 2020-01-10 DIAGNOSIS — M25511 Pain in right shoulder: Secondary | ICD-10-CM

## 2020-01-10 DIAGNOSIS — G8929 Other chronic pain: Secondary | ICD-10-CM
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
## 2020-01-10 NOTE — Telephone Encounter (Signed)
Noted. Agree with advice given.

## 2020-01-11 ENCOUNTER — Encounter: Payer: Self-pay | Admitting: Nurse Practitioner

## 2020-01-18 ENCOUNTER — Telehealth (HOSPITAL_COMMUNITY): Payer: Self-pay | Admitting: *Deleted

## 2020-01-18 ENCOUNTER — Other Ambulatory Visit (HOSPITAL_COMMUNITY): Payer: Self-pay | Admitting: Psychiatry

## 2020-01-18 NOTE — Telephone Encounter (Signed)
FYI pt has called leaving several messages regarding refilling the Klonopin and Adderall. Epic showing Adderall last fill on 01/05/20 and the Klonopin on 10/31/with one refill. Writer left VM for pt advising her that both meds are too early to fill. Pt has upcoming appointment on 12/29.

## 2020-01-18 NOTE — Telephone Encounter (Signed)
Pt called requesting refill of the Adderall. Pt next appointment on 02/06/20.

## 2020-01-21 ENCOUNTER — Telehealth (HOSPITAL_COMMUNITY): Payer: Self-pay | Admitting: *Deleted

## 2020-01-21 ENCOUNTER — Other Ambulatory Visit (HOSPITAL_COMMUNITY): Payer: Self-pay | Admitting: Psychiatry

## 2020-01-21 MED ORDER — AMPHETAMINE-DEXTROAMPHET ER 25 MG PO CP24: 25.0000 mg | ORAL_CAPSULE | ORAL | 0 refills | Status: DC

## 2020-01-21 MED ORDER — CLONAZEPAM 1 MG PO TABS: 1.0000 mg | ORAL_TABLET | Freq: Two times a day (BID) | ORAL | 2 refills | Status: DC | PRN

## 2020-01-21 NOTE — Telephone Encounter (Signed)
According to pt pharmacy, [facility], rx's for November fill of the Adderall and the 12/09/19 fill of the Klonopin were not received. Please resend. Pt has an appointment upcoming on 02/06/20.

## 2020-01-21 NOTE — Telephone Encounter (Signed)
Sorry yes. Pt made aware.

## 2020-01-21 NOTE — Telephone Encounter (Signed)
I cancelled previous Rx and reentered them - hope it will work.

## 2020-01-21 NOTE — Telephone Encounter (Signed)
She has two refills remaining - one for 11/27 and one for 12/27?

## 2020-01-23 ENCOUNTER — Encounter: Payer: Self-pay | Admitting: Nurse Practitioner

## 2020-01-25 ENCOUNTER — Other Ambulatory Visit: Payer: Self-pay | Admitting: Nurse Practitioner

## 2020-01-25 DIAGNOSIS — Z01818 Encounter for other preprocedural examination: Secondary | ICD-10-CM

## 2020-01-28 ENCOUNTER — Encounter: Payer: Self-pay | Admitting: Nurse Practitioner

## 2020-01-28 ENCOUNTER — Ambulatory Visit: Payer: Self-pay

## 2020-01-28 ENCOUNTER — Ambulatory Visit (INDEPENDENT_AMBULATORY_CARE_PROVIDER_SITE_OTHER): Payer: Medicaid Other

## 2020-01-28 ENCOUNTER — Ambulatory Visit (INDEPENDENT_AMBULATORY_CARE_PROVIDER_SITE_OTHER): Payer: Medicaid Other | Admitting: Specialist

## 2020-01-28 ENCOUNTER — Encounter: Payer: Self-pay | Admitting: Specialist

## 2020-01-28 ENCOUNTER — Other Ambulatory Visit: Payer: Self-pay

## 2020-01-28 ENCOUNTER — Ambulatory Visit: Payer: Medicaid Other | Attending: Nurse Practitioner | Admitting: Nurse Practitioner

## 2020-01-28 VITALS — BP 121/82 | HR 98 | Temp 98.1°F | Ht 62.2 in | Wt 212.2 lb

## 2020-01-28 VITALS — BP 124/85 | HR 91 | Ht 62.0 in | Wt 212.3 lb

## 2020-01-28 DIAGNOSIS — Z9889 Other specified postprocedural states: Secondary | ICD-10-CM

## 2020-01-28 DIAGNOSIS — K429 Umbilical hernia without obstruction or gangrene: Secondary | ICD-10-CM | POA: Diagnosis not present

## 2020-01-28 DIAGNOSIS — Z23 Encounter for immunization: Secondary | ICD-10-CM | POA: Diagnosis not present

## 2020-01-28 DIAGNOSIS — M25511 Pain in right shoulder: Secondary | ICD-10-CM | POA: Diagnosis not present

## 2020-01-28 DIAGNOSIS — Z09 Encounter for follow-up examination after completed treatment for conditions other than malignant neoplasm: Secondary | ICD-10-CM

## 2020-01-28 MED ORDER — HYDROCODONE-ACETAMINOPHEN 5-325 MG PO TABS
1.0000 | ORAL_TABLET | Freq: Four times a day (QID) | ORAL | 0 refills | Status: DC | PRN
Start: 2020-01-28 — End: 2020-03-21
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
## 2020-01-28 NOTE — Progress Notes (Signed)
Assessment & Plan:  Celsa was seen today for follow-up.  Diagnoses and all orders for this visit:  Umbilical hernia without obstruction and without gangrene -     Ambulatory referral to [facility] discharge follow-up  Need for immunization against influenza -     Flu Vaccine QUAD 36+ mos IM    Patient has been counseled on age-appropriate routine health concerns for screening and prevention. These are reviewed and up-to-date. Referrals have been placed accordingly. Immunizations are up-to-date or declined.    Subjective:   Chief Complaint  Patient presents with  . Follow-up    Pt. Is here for emergency department Follow up. Pt. Stated she is still feeling the same.    HPI [name] 24 y.o. female presents to office today for hospital follow up.  Past medical history significant for generalized anxiety disorder, ADHD, bipolar 2 disorder, GERD, cervical disc herniation with radiculopathy   She would like to speak to a gynecologist regarding reversal of tubal ligation.  She would like to have more children.  States she had her "tubes tied and clipped" in 2017.  Also states that she is at several miscarriages???since her tubal ligation however I am unable to find any documentation of this. BMI 38. She is a smoker.   Also requesting referral to [place] regarding painful umbilical hernia. On exam I was only able to visualize any area of concern that could be causing her current symptoms.  The size of the umbilicus does not correlate with the symptoms she is describing. Notes moderate abdominal pain and decreased appetite due to to the hernia which she is requesting be removed.     She was evaluated in the ED on 01-09-2020 with complaints of left sided chest pain. EKG was normal as well as chest xray, and labs. She was instructed to follow up with her PCP regarding anxiety. Today we discussed her ED visit and she will continue on her current medications at this time.      Review of Systems  Constitutional: Negative for fever, malaise/fatigue and weight loss.  HENT: Negative.  Negative for nosebleeds.   Eyes: Negative.  Negative for blurred vision, double vision and photophobia.  Respiratory: Negative.  Negative for cough and shortness of breath.   Cardiovascular: Negative.  Negative for chest pain, palpitations and leg swelling.  Gastrointestinal: Positive for abdominal pain. Negative for blood in stool, constipation, diarrhea, heartburn, melena, nausea and vomiting.  Musculoskeletal: Negative.  Negative for myalgias.  Neurological: Negative.  Negative for dizziness, focal weakness, seizures and headaches.  Psychiatric/Behavioral: Negative.  Negative for suicidal ideas.    Past Medical History:  Diagnosis Date  . Anemia   . Anxiety   . Arthritis    lower back  . Asthma   . Bipolar 1 disorder ([place])   . 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  . Chronic kidney disease   . Depression   . GERD (gastroesophageal reflux disease)   . Headache    otc med prn  . History of kidney stones   . HLD (hyperlipidemia)    diet controlled  . Molar pregnancy   . Pneumonia    x 1 yrs ago per patient 11/22/19  . Renal calculus or stone    had stent/ lithotripsy in past  . Sleep apnea    does not use cpap  . Substance abuse ([place])   . Suicide attempt ([place])    x 2    Past Surgical History:  Procedure Laterality Date  .  CERVICAL DISC ARTHROPLASTY N/A 11/26/2019   Procedure: CERVICAL DISC ARTHROPLASTY C5-6 WITH PRO [place];  Surgeon: Jessy Oto, MD;  Location: [facility];  Service: Orthopedics;  Laterality: N/A;  . CYSTOSCOPY     x2 with stone extraction  . EXTRACORPOREAL SHOCK WAVE LITHOTRIPSY Right 06/20/2013   Procedure: EXTRACORPOREAL SHOCK WAVE LITHOTRIPSY (ESWL);  Surgeon: Edwin Dada, MD;  Location: [facility];  Service: Urology;  Laterality: Right;  . FOOT SURGERY Left 2003  . LITHOTRIPSY    . TUBAL LIGATION  2017  . TUBAL LIGATION      Family History  Problem  Relation Age of Onset  . Depression Mother   . Asthma Mother   . Hypertension Mother   . Mood Disorder Mother   . Heart disease Father   . Kidney disease Father   . Alcoholism Father   . Breast cancer Paternal Aunt   . Breast cancer Maternal Grandmother   . Breast cancer Cousin   . Alcoholism Brother   . Drug abuse Brother     Social History Reviewed with no changes to be made today.   Outpatient Medications Prior to Visit  Medication Sig Dispense Refill  . [START ON 03/21/2020] amphetamine-dextroamphetamine (ADDERALL XR) 25 MG 24 hr capsule Take 1 capsule by mouth every morning. 30 capsule 0  . clonazePAM (KLONOPIN) 1 MG tablet Take 1 tablet (1 mg total) by mouth 2 (two) times daily as needed for anxiety. 60 tablet 2  . escitalopram (LEXAPRO) 20 MG tablet Take 1.5 tablets (30 mg total) by mouth daily. 45 tablet 2  . fluticasone (FLONASE) 50 MCG/ACT nasal spray Place 1 spray into both nostrils daily for 14 days. (Patient taking differently: Place 1 spray into both nostrils daily as needed (allergies.).) 16 g 0  . lidocaine (XYLOCAINE) 2 % solution Use as directed 15 mLs in the mouth or throat every 6 (six) hours as needed for mouth pain. 200 mL 1  . methylPREDNISolone (MEDROL) 4 MG tablet 6 day taper to be taken as directed. 21 tablet 0  . mirtazapine (REMERON SOL-TAB) 15 MG disintegrating tablet Take 1 tablet (15 mg total) by mouth at bedtime. 30 tablet 1  . amphetamine-dextroamphetamine (ADDERALL XR) 25 MG 24 hr capsule Take 1 capsule by mouth every morning. (Patient not taking: Reported on 01/28/2020) 30 capsule 0  . [START ON 02/20/2020] amphetamine-dextroamphetamine (ADDERALL XR) 25 MG 24 hr capsule Take 1 capsule by mouth every morning. (Patient not taking: Reported on 01/28/2020) 30 capsule 0  . HYDROcodone-acetaminophen (NORCO/VICODIN) 5-325 MG tablet Take 1 tablet by mouth every 6 (six) hours as needed for moderate pain. (Patient not taking: Reported on 01/28/2020) 30 tablet 0  .  lamoTRIgine (LAMICTAL) 150 MG tablet Take 1 tablet (150 mg total) by mouth 2 (two) times daily. (Patient not taking: Reported on 01/28/2020) 60 tablet 2  . methocarbamol (ROBAXIN) 500 MG tablet Take 1 tablet (500 mg total) by mouth every 8 (eight) hours as needed for muscle spasms. (Patient not taking: Reported on 01/28/2020) 30 tablet 1  . mupirocin ointment (BACTROBAN) 2 % Apply 1 application topically 2 (two) times daily. (Patient not taking: Reported on 01/28/2020) 15 g 0  . phenol (CHLORASEPTIC) 1.4 % LIQD Use as directed 1 spray in the mouth or throat as needed for throat irritation / pain. (Patient not taking: Reported on 01/28/2020) 177 mL 1   No facility-administered medications prior to visit.    Allergies  Allergen Reactions  . Levofloxacin   . Penicillins Hives  No anaphylaxis  Did it involve swelling of the face/tongue/throat, SOB, or low BP? No Did it involve sudden or severe rash/hives, skin peeling, or any reaction on the inside of your mouth or nose? Yes Did you need to seek medical attention at a hospital or doctor's office? Yes When did it last happen?Childhood If all above answers are "NO", may proceed with cephalosporin use.   Marland Kitchen Phenergan [Promethazine Hcl]     Tremors, restless lef, akathesia 01-26-2018       Objective:    BP 121/82 (BP Location: Left Arm, Patient Position: Sitting, Cuff Size: Large)   Pulse 98   Temp 98.1 F (36.7 C) (Oral)   Ht 5' 2.2" (1.58 m)   Wt 212 lb 3.2 oz (96.3 kg)   SpO2 96%   BMI 38.56 kg/m  Wt Readings from Last 3 Encounters:  01/28/20 212 lb 3.2 oz (96.3 kg)  12/10/19 201 lb (91.2 kg)  11/29/19 201 lb (91.2 kg)     Physical Exam Vitals and nursing note reviewed.  Constitutional:      Appearance: She is well-developed and well-nourished.  HENT:     Head: Normocephalic and atraumatic.  Eyes:     Extraocular Movements: EOM normal.  Cardiovascular:     Rate and Rhythm: Normal rate and regular rhythm.      Pulses: Intact distal pulses.     Heart sounds: Normal heart sounds. No murmur heard. No friction rub. No gallop.   Pulmonary:     Effort: Pulmonary effort is normal. No tachypnea or respiratory distress.     Breath sounds: Normal breath sounds. No decreased breath sounds, wheezing, rhonchi or rales.  Chest:     Chest wall: No tenderness.  Abdominal:     General: Bowel sounds are normal.     Palpations: Abdomen is soft.     Comments: SEE PHOTOS   Musculoskeletal:        General: No edema. Normal range of motion.     Cervical back: Normal range of motion.  Skin:    General: Skin is warm and dry.  Neurological:     Mental Status: She is alert and oriented to person, place, and time.     Coordination: Coordination normal.  Psychiatric:        Mood and Affect: Mood and affect normal.        Behavior: Behavior normal. Behavior is cooperative.        Thought Content: Thought content normal.        Judgment: Judgment normal.          Patient has been counseled extensively about nutrition and exercise as well as the importance of adherence with medications and regular follow-up. The patient was given clear instructions to go to ER or return to medical center if symptoms don't improve, worsen or new problems develop. The patient verbalized understanding.   Follow-up: Return if symptoms worsen or fail to improve.   Gildardo Pounds, FNP-BC [facility] and [facility] [place], [place]   01/28/2020, 2:40 PM

## 2020-02-06 ENCOUNTER — Telehealth (INDEPENDENT_AMBULATORY_CARE_PROVIDER_SITE_OTHER): Payer: Medicaid Other | Admitting: Psychiatry

## 2020-02-06 ENCOUNTER — Other Ambulatory Visit: Payer: Self-pay

## 2020-02-06 DIAGNOSIS — F3181 Bipolar II disorder: Secondary | ICD-10-CM

## 2020-02-06 DIAGNOSIS — F411 Generalized anxiety disorder: Secondary | ICD-10-CM

## 2020-02-06 DIAGNOSIS — F902 Attention-deficit hyperactivity disorder, combined type: Secondary | ICD-10-CM | POA: Diagnosis not present

## 2020-02-06 MED ORDER — ESCITALOPRAM OXALATE 20 MG PO TABS: 30.0000 mg | ORAL_TABLET | Freq: Every day | ORAL | 2 refills | Status: DC

## 2020-02-06 MED ORDER — ZALEPLON 10 MG PO CAPS
10.0000 mg | ORAL_CAPSULE | Freq: Every evening | ORAL | 1 refills | Status: DC | PRN
Start: 2020-02-06 — End: 2020-03-13

## 2020-02-06 NOTE — Progress Notes (Signed)
[facility] MD/PA/NP OP Progress Note  02/06/2020 10:07 AM Kellie Martinez  MRN:  [id number] Interview was conducted using videoconferencing application and I verified that I was speaking with the correct person using two identifiers. I discussed the limitations of evaluation and management by telemedicine and  the availability of in person appointments. Patient expressed understanding and agreed to proceed. Participants in the visit: patient (location - home); physician (location - home office).  Chief Complaint: Insomnia.  HPI: 24yo DWFwithbipolar disorder and ADHD(the latter diagnosedwhen she was 11-39 years old). Unfortunately she does not remember medications she has been tried for either of these conditions just says that "there were many". She seems to recall names of Ritalin and Adderall. Available record also shows that she was tried on SSRIs (citalopram, fluoxetine), aripiprazole, oxcarbazepine, hydroxyzine, clonazepam. She describes her mood swings as being very rapid - happening several times during the day.  WestartedLamictal titration and escitalopram 10 mg daily. Initially she had muscle cramps and some sedation but both have resolved.She also complainedof difficulty with falling asleep - trazodone has not been very effective for that (150 mg)andregularAmbien only helped her fall asleep but she is up after few hours.I have ordered Ambien CRbut it was not approved by her insurance.She also failed temazepam, eszopiclone, mirtazapine and quetiapine (developed muscle twitching on it). We have added Adderall XR 20 mgandwhile she foundit helpful for concentrationis was notyet optimal. We increased dose to 30 mg and she reacted with increased anxiety(thesame happened when we tried Concerta 27 mg) but tolerates 25 mg well.She complainedof feeling stressed out, anxious often and we have added clonazepam for anxiety - she finds it effective.    Visit Diagnosis:    ICD-10-CM   1.  Bipolar 2 disorder (HCC)  F31.81   2. Attention deficit hyperactivity disorder (ADHD), combined type  F90.2   3. GAD (generalized anxiety disorder)  F41.1     Past Psychiatric History: Please see intake H&P.  Past Medical History:  Past Medical History:  Diagnosis Date  . Anemia   . Anxiety   . Arthritis    lower back  . Asthma   . Bipolar 1 disorder (H[facility])   . 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  . Chronic kidney disease   . Depression   . GERD (gastroesophageal reflux disease)   . Headache    otc med prn  . History of kidney stones   . HLD (hyperlipidemia)    diet controlled  . Molar pregnancy   . Pneumonia    x 1 yrs ago per patient 11/22/19  . Renal calculus or stone    had stent/ lithotripsy in past  . Sleep apnea    does not use cpap  . Substance abuse ([facility])   . Suicide attempt ([facility])    x 2    Past Surgical History:  Procedure Laterality Date  . CERVICAL DISC ARTHROPLASTY N/A 11/26/2019   Procedure: CERVICAL DISC ARTHROPLASTY C5-6 WITH PRO [facility]C;  Surgeon: Kerrin Champagne, MD;  [facility];  Service: Orthopedics;  Laterality: N/A;  . CYSTOSCOPY     x2 with stone extraction  . EXTRACORPOREAL SHOCK WAVE LITHOTRIPSY Right 06/20/2013   Procedure: EXTRACORPOREAL SHOCK WAVE LITHOTRIPSY (ESWL);  Surgeon: Renee Ramus, MD;  Location: [facility];  Service: Urology;  Laterality: Right;  . FOOT SURGERY Left 2003  . LITHOTRIPSY    . TUBAL LIGATION  2017  . TUBAL LIGATION      Family Psychiatric History: Reviewed.  Family History:  Family History  Problem Relation Age of Onset  . Depression Mother   . Asthma Mother   . Hypertension Mother   . Mood Disorder Mother   . Heart disease Father   . Kidney disease Father   . Alcoholism Father   . Breast cancer Paternal Aunt   . Breast cancer Maternal Grandmother   . Breast cancer Cousin   . Alcoholism Brother   . Drug abuse Brother     Social History:  Social History   Socioeconomic History  . Marital status: Single     Spouse name: Not on file  . Number of children: 3  . Years of education: Not on file  . Highest education level: Not on file  Occupational History  . Not on file  Tobacco Use  . Smoking status: Current Every Day Smoker    Packs/day: 0.25    Types: Cigarettes  . Smokeless tobacco: Never Used  . Tobacco comment: 3 cig/day  Vaping Use  . Vaping Use: Former  . Quit date: 02/01/2019  Substance and Sexual Activity  . Alcohol use: Yes    Comment:  weekends wine/liquor  . Drug use: Not Currently    Types: Marijuana    Comment: Last use - 11/17/19  . Sexual activity: Yes    Birth control/protection: Surgical    Comment: Tubal  Other Topics Concern  . Not on file  Social History Narrative  . Not on file   Social Determinants of Health   Financial Resource Strain: Not on file  Food Insecurity: Not on file  Transportation Needs: Not on file  Physical Activity: Not on file  Stress: Not on file  Social Connections: Not on file    Allergies:  Allergies  Allergen Reactions  . Levofloxacin   . Penicillins Hives    No anaphylaxis  Did it involve swelling of the face/tongue/throat, SOB, or low BP? No Did it involve sudden or severe rash/hives, skin peeling, or any reaction on the inside of your mouth or nose? Yes Did you need to seek medical attention at a hospital or doctor's office? Yes When did it last happen?Childhood If all above answers are "NO", may proceed with cephalosporin use.   Marland Kitchen Phenergan [Promethazine Hcl]     Tremors, restless lef, akathesia [id number]    Metabolic Disorder Labs: Lab Results  Component Value Date   HGBA1C 5.1 07/04/2019   No results found for: PROLACTIN Lab Results  Component Value Date   CHOL 108 03/13/2018   TRIG 45 03/13/2018   HDL 35 (L) 03/13/2018   CHOLHDL 3.1 03/13/2018   [place] 64 03/13/2018   Lab Results  Component Value Date   TSH 1.170 03/13/2018    Therapeutic Level Labs: No results found for: LITHIUM No  results found for: VALPROATE No components found for:  CBMZ  Current Medications: Current Outpatient Medications  Medication Sig Dispense Refill  . zaleplon (SONATA) 10 MG capsule Take 1 capsule (10 mg total) by mouth at bedtime as needed for sleep. 30 capsule 1  . [START ON 03/21/2020] amphetamine-dextroamphetamine (ADDERALL XR) 25 MG 24 hr capsule Take 1 capsule by mouth every morning. 30 capsule 0  . clonazePAM (KLONOPIN) 1 MG tablet Take 1 tablet (1 mg total) by mouth 2 (two) times daily as needed for anxiety. 60 tablet 2  . [START ON 02/21/2020] escitalopram (LEXAPRO) 20 MG tablet Take 1.5 tablets (30 mg total) by mouth daily. 45 tablet 2  . HYDROcodone-acetaminophen (NORCO/VICODIN) 5-325 MG tablet Take 1 tablet by mouth  every 6 (six) hours as needed for moderate pain. 30 tablet 0  . lidocaine (XYLOCAINE) 2 % solution Use as directed 15 mLs in the mouth or throat every 6 (six) hours as needed for mouth pain. 200 mL 1  . methylPREDNISolone (MEDROL) 4 MG tablet 6 day taper to be taken as directed. 21 tablet 0   No current facility-administered medications for this visit.     Psychiatric Specialty Exam: Review of Systems  Psychiatric/Behavioral: Positive for sleep disturbance. The patient is nervous/anxious.   All other systems reviewed and are negative.   There were no vitals taken for this visit.There is no height or weight on file to calculate BMI.  General Appearance: NA  Eye Contact:  NA  Speech:  Clear and Coherent and Normal Rate  Volume:  Normal  Mood:  Anxious  Affect:  NA  Thought Process:  Goal Directed  Orientation:  Full (Time, Place, and Person)  Thought Content: Logical   Suicidal Thoughts:  No  Homicidal Thoughts:  No  Memory:  Immediate;   Fair Recent;   Fair Remote;   Fair  Judgement:  Good  Insight:  Fair  Psychomotor Activity:  NA  Concentration:  Concentration: Good  Recall:  [place] of Knowledge: Fair  Language: Good  Akathisia:  Negative   Handed:  Right  AIMS (if indicated): not done  Assets:  Communication Skills Desire for Improvement Housing Social Support  ADL's:  Intact  Cognition: WNL  Sleep:  Poor   Screenings: GAD-7   Physiological scientist Office Visit from 08/03/2019 in [place] Office Visit from 07/04/2019 in [place] Office Visit from 02/20/2018 in Primary Care at [facility]  Total GAD-7 Score 20 20 18     PHQ2-9   [place] Office Visit from 01/28/2020 in [place] Office Visit from 08/03/2019 in [place] Office Visit from 07/04/2019 in [place] Office Visit from 02/20/2018 in Primary Care at [facility] Visit from 05/06/2017 in [place]  PHQ-2 Total Score 4 2 4 3 2   PHQ-9 Total Score 18 11 20 16 12        Assessment and Plan: 24yo DWFwithbipolar disorder and ADHD(the latter diagnosedwhen she was 53-17 years old). Unfortunately she does not remember medications she has been tried for either of these conditions just says that "there were many". She seems to recall names of Ritalin and Adderall. Available record also shows that she was tried on SSRIs (citalopram, fluoxetine), aripiprazole, oxcarbazepine, hydroxyzine, clonazepam. She describes her mood swings as being very rapid - happening several times during the day.  WestartedLamictal titration and escitalopram 10 mg daily. Initially she had muscle cramps and some sedation but both have resolved.She also complainedof difficulty with falling asleep - trazodone has not been very effective for that (150 mg)andregularAmbien only helped her fall asleep but she is up after few hours.I have ordered Ambien CRbut it was not approved by her insurance.She also failed temazepam, eszopiclone, mirtazapine and quetiapine (developed muscle twitching on it). We have added  Adderall XR 20 mgandwhile she foundit helpful for concentrationis was notyet optimal. We increased dose to 30 mg and she reacted with increased anxiety(thesame happened when we tried Concerta 27 mg) but tolerates 25 mg well.She complainedof feeling stressed out, anxious often and we have added clonazepam for anxiety - she finds it effective.Marland Kitchen   Dx: Bipolar  2ultra rapid cycling; GAD; ADHD  Plan: Continue escitalopram 30mg  for anxiety,Lamictal 150 mg bid, Adderall XR25 mg andclonazepam 1 mg bid prn anxiety.We will discontinue mirtazapine (ineffective) and try zaleplon 10 mg for insomnia. Next appointment in8 weeks.The plan was discussed with patient who had an opportunity to ask questions and these were all answered. I spend67minutes inphoneconsultation with the patient.   Stephanie Acre, MD 02/06/2020, 10:07 AM

## 2020-02-12 ENCOUNTER — Encounter: Payer: Self-pay | Admitting: Nurse Practitioner

## 2020-02-12 DIAGNOSIS — N8312 Corpus luteum cyst of left ovary: Secondary | ICD-10-CM | POA: Diagnosis not present

## 2020-02-12 DIAGNOSIS — N898 Other specified noninflammatory disorders of vagina: Secondary | ICD-10-CM | POA: Diagnosis not present

## 2020-02-12 DIAGNOSIS — Z3202 Encounter for pregnancy test, result negative: Secondary | ICD-10-CM | POA: Diagnosis not present

## 2020-02-12 DIAGNOSIS — N390 Urinary tract infection, site not specified: Secondary | ICD-10-CM | POA: Diagnosis not present

## 2020-02-12 DIAGNOSIS — R109 Unspecified abdominal pain: Secondary | ICD-10-CM | POA: Diagnosis not present

## 2020-02-13 ENCOUNTER — Encounter: Payer: Self-pay | Admitting: Nurse Practitioner

## 2020-02-18 ENCOUNTER — Encounter: Payer: Self-pay | Admitting: Nurse Practitioner

## 2020-02-18 ENCOUNTER — Other Ambulatory Visit: Payer: Self-pay | Admitting: Nurse Practitioner

## 2020-02-18 ENCOUNTER — Encounter: Payer: Self-pay | Admitting: Specialist

## 2020-02-18 ENCOUNTER — Ambulatory Visit: Payer: Medicaid Other | Attending: Specialist | Admitting: Physical Therapy

## 2020-02-18 ENCOUNTER — Other Ambulatory Visit: Payer: Self-pay

## 2020-02-18 ENCOUNTER — Encounter: Payer: Self-pay | Admitting: Physical Therapy

## 2020-02-18 ENCOUNTER — Other Ambulatory Visit: Payer: Self-pay | Admitting: Radiology

## 2020-02-18 ENCOUNTER — Telehealth: Payer: Self-pay | Admitting: Specialist

## 2020-02-18 DIAGNOSIS — R293 Abnormal posture: Secondary | ICD-10-CM | POA: Insufficient documentation

## 2020-02-18 DIAGNOSIS — M6281 Muscle weakness (generalized): Secondary | ICD-10-CM | POA: Diagnosis not present

## 2020-02-18 DIAGNOSIS — G8929 Other chronic pain: Secondary | ICD-10-CM | POA: Diagnosis not present

## 2020-02-18 DIAGNOSIS — M25511 Pain in right shoulder: Secondary | ICD-10-CM | POA: Diagnosis not present

## 2020-02-18 DIAGNOSIS — Z3169 Encounter for other general counseling and advice on procreation: Secondary | ICD-10-CM

## 2020-02-18 DIAGNOSIS — M25611 Stiffness of right shoulder, not elsewhere classified: Secondary | ICD-10-CM | POA: Diagnosis not present

## 2020-02-18 NOTE — Therapy (Signed)
[facility] [place], [place], [address] Phone: [phone]   Fax:  [phone]  Physical Therapy Evaluation  Patient Details  Name: Kellie Martinez MRN: [id number] Date of Birth: February 03, 1996 Referring Provider (PT): Basil Dess, MD   Encounter Date: 02/18/2020   PT End of Session - 02/18/20 0903    Visit Number 1    Number of Visits 12    Date for PT Re-Evaluation 04/07/20    Authorization Type Medicaid; Progress note every 10th visit    PT Start Time 0823   late arrival   PT Stop Time 0900    PT Time Calculation (min) 37 min    Activity Tolerance Patient tolerated treatment well    Behavior During Therapy [facility] for tasks assessed/performed           Past Medical History:  Diagnosis Date  . Anemia   . Anxiety   . Arthritis    lower back  . Asthma   . Bipolar 1 disorder ([place])   . 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  . Chronic kidney disease   . Depression   . GERD (gastroesophageal reflux disease)   . Headache    otc med prn  . History of kidney stones   . HLD (hyperlipidemia)    diet controlled  . Molar pregnancy   . Pneumonia    x 1 yrs ago per patient 11/22/19  . Renal calculus or stone    had stent/ lithotripsy in past  . Sleep apnea    does not use cpap  . Substance abuse ([place])   . Suicide attempt ([place])    x 2    Past Surgical History:  Procedure Laterality Date  . CERVICAL DISC ARTHROPLASTY N/A 11/26/2019   Procedure: CERVICAL DISC ARTHROPLASTY C5-6 WITH PRO [place];  Surgeon: Jessy Oto, MD;  Location: [facility];  Service: Orthopedics;  Laterality: N/A;  . CYSTOSCOPY     x2 with stone extraction  . EXTRACORPOREAL SHOCK WAVE LITHOTRIPSY Right 06/20/2013   Procedure: EXTRACORPOREAL SHOCK WAVE LITHOTRIPSY (ESWL);  Surgeon: Edwin Dada, MD;  Location: [facility];  Service: Urology;  Laterality: Right;  . FOOT SURGERY Left 2003  . LITHOTRIPSY    . TUBAL LIGATION  2017  . TUBAL LIGATION      There were no vitals  filed for this visit.    Subjective Assessment - 02/18/20 0919    Subjective COVID-19 screening performed upon arrival. Patient arrives to physical therapy with chronic right shoulder pain and popping that exacerbated after a fall about 3 weeks ago. Patient reported pain began in 2020 after a fall on her right shoulder. Patient reports difficulties with ADLs, home activities, child care and dog care activities secondary to pain. Patient reports intermittent numbness and tingling to right elbow. Patient reports pain at worst as 10/10 and pain at best as 5/10. Patient's goals are to decrease pain, improve movement, and improve ability to perform ADLs, home activities, and child care activities.    Pertinent History Cervical disc arthroplasty 11/26/2019; generalized anxiety disorder, ADHD, bipolar 1 disorder, Chronic kidney disease, depression    Limitations Lifting;House hold activities    Diagnostic tests x-ray: mild AC joint separation    Patient Stated Goals improve movement and stop pain    Currently in Pain? Yes    Pain Score 9     Pain Location Shoulder    Pain Orientation Right;Anterior    Pain Descriptors / Indicators Aching;Constant;Sore;Shooting;Sharp    Pain Type Chronic  pain    Pain Radiating Towards right elbow    Pain Onset More than a month ago    Pain Frequency Constant    Aggravating Factors  movement, laying on it    Pain Relieving Factors running hot water on it, tylenol as needed    Effect of Pain on Daily Activities hard to do normal activities without pain              [facility] PT Assessment - 02/18/20 0001      Assessment   Medical Diagnosis Acute pain of right shoulder    Referring Provider (PT) Basil Dess, MD    Onset Date/Surgical Date --   chronic; exacerbation about 3 weeks ago   Hand Dominance Right    Next MD Visit 02/27/2020    Prior Therapy no      Precautions   Precautions None      Restrictions   Weight Bearing Restrictions No      Balance  Screen   Has the patient fallen in the past 6 months Yes    How many times? 3    Has the patient had a decrease in activity level because of a fear of falling?  Yes    Is the patient reluctant to leave their home because of a fear of falling?  Yes   "sometimes"     [place] residence    Living Arrangements Children;Spouse/significant other      Prior Function   Level of Independence Independent with basic ADLs      Observation/Other Assessments   Observations Patient very guarded to right shoulder      Posture/Postural Control   Posture/Postural Control Postural limitations    Postural Limitations Rounded Shoulders;Forward head;Increased thoracic kyphosis      ROM / Strength   AROM / PROM / Strength AROM;Strength;PROM      AROM   Overall AROM  Deficits;Due to pain    AROM Assessment Site Shoulder    Right/Left Shoulder Right    Right Shoulder Flexion 119 Degrees    Right Shoulder ABduction 81 Degrees    Right Shoulder Internal Rotation --   T8   Right Shoulder External Rotation 58 Degrees   T4     PROM   Overall PROM  Deficits;Due to pain    Overall PROM Comments heavily guarded through PROM despite oscillations and verbal cuing    PROM Assessment Site Shoulder    Right/Left Shoulder Right    Right Shoulder Flexion 131 Degrees    Right Shoulder ABduction 90 Degrees    Right Shoulder Internal Rotation 35 Degrees    Right Shoulder External Rotation 45 Degrees      Strength   Overall Strength Deficits;Due to pain    Strength Assessment Site Shoulder    Right/Left Shoulder Right    Right Shoulder Flexion 3-/5    Right Shoulder ABduction 3-/5    Right Shoulder Internal Rotation 3/5    Right Shoulder External Rotation 3/5      Palpation   Palpation comment very tender globally though shoulder though increased tenderness to R AC joint, along R clavicle, and biciptial groove and lateral delotid.      Special Tests    Special Tests  Rotator Cuff Impingement    Rotator Cuff Impingment tests Painful Arc of Motion;Hawkins- Kennedy test      Hawkins-Kennedy test   Findings Positive    Side Right      Painful  Arc of Motion   Findings Positive    Side Right                      Objective measurements completed on examination: See above findings.               PT Education - 02/18/20 0931    Education Details scapular retractions, flexion table slides, standing AAROM ER/IR, supine AAROM flexion    Person(s) Educated Patient    Methods Explanation;Demonstration;Handout    Comprehension Verbalized understanding            PT Short Term Goals - 02/18/20 0908      PT SHORT TERM GOAL #1   Title Patient will be independent with HEP    Baseline no knowledge of exercises    Time 3    Period Weeks    Status New      PT SHORT TERM GOAL #2   Title Patient will demonstrate 130 degrees of R shoulder flexion AROM to improve ability to perform overhead tasks    Baseline 120 degrees R shoulder flexion AROM    Time 3    Period Weeks    Status New      PT SHORT TERM GOAL #3   Title Patient will demonstrate 55+ degrees of right shoulder ER AROM to improve donning and doffing apparel.    Baseline 50 degree R shoulder ER AROM    Time 3    Period Weeks    Status New             PT Long Term Goals - 02/18/20 0910      PT LONG TERM GOAL #1   Title Patient will be independent with advanced HEP.    Baseline No knowledge of HEP    Time 6    Period Weeks    Status New      PT LONG TERM GOAL #2   Title Patient will demonstrate 140+ degrees of right shoulder flexion AROM to improve ability to perform overhead tasks.    Baseline 120+ degrees or right shoulder flexion AROM    Time 6    Period Weeks    Status New      PT LONG TERM GOAL #3   Title Patient will demonstrate 3+/5 or greater right shoulder MMT in all planes to improve stability during functional tasks.    Time 6    Period Weeks     Status New      PT LONG TERM GOAL #4   Title Patient will reports ability to perform ADLs, home activities, and child care activities with right shoulder pain less than or equal to 6/10.    Baseline 10/10 with activities    Time 6    Period Weeks    Status New                  Plan - 02/18/20 0933    Clinical Impression Statement Patient is a 25 year old right handed female who presents to physical therapy with chronic right shoulder pain, decreased ROM, decreased right shoulder MMT secondary to a fall about 3 weeks ago. Patient with rounded shoulders and forward head and increased thoracic kyphosis in sitting. Patient very tender globally to right shoulder but specifically to R AC joint, R UT, bicipital groove, and belly of right biceps. Patient very guarded with PROM with reports of pain. Patient and PT discussed plan of care and discussed HEP to  which patient reported understanding. Patient would benefit from skilled physical therapy to address deficits and patient's goals.    Personal Factors and Comorbidities Comorbidity 3+;Time since onset of injury/illness/exacerbation    Comorbidities Cervical disc arthroplasty 11/26/2019; generalized anxiety disorder, ADHD, bipolar 1 disorder, Chronic kidney disease, depression    Examination-Activity Limitations Bathing;Locomotion Level;Bed Mobility;Hygiene/Grooming;Dressing;Caring for Others;Lift    Examination-Participation Restrictions Meal Prep;Cleaning;Laundry    Stability/Clinical Decision Making Stable/Uncomplicated    Clinical Decision Making Low    Rehab Potential Fair    PT Frequency 2x / week    PT Duration 6 weeks    PT Treatment/Interventions ADLs/Self Care Home Management;Cryotherapy;Electrical Stimulation;Iontophoresis 4mg /ml Dexamethasone;Moist Heat;Ultrasound;Functional mobility training;Therapeutic activities;Therapeutic exercise;Manual techniques;Passive range of motion;Patient/family education;Vasopneumatic  Device;Taping    PT Next Visit Plan AAROM to R shoulder to tolerance, PROM and gentle stretching, combo or ultrasound to decrease pain; modalities PRN for pain relief    PT Home Exercise Plan see patient education section    Consulted and Agree with Plan of Care Patient           Patient will benefit from skilled therapeutic intervention in order to improve the following deficits and impairments:  Decreased range of motion,Decreased activity tolerance,Decreased strength,Postural dysfunction,Pain,Impaired UE functional use  Visit Diagnosis: Chronic right shoulder pain - Plan: PT plan of care cert/re-cert  Stiffness of right shoulder, not elsewhere classified - Plan: PT plan of care cert/re-cert  Muscle weakness (generalized) - Plan: PT plan of care cert/re-cert  Abnormal posture - Plan: PT plan of care cert/re-cert     Problem List Patient Active Problem List   Diagnosis Date Noted  . Herniation of cervical intervertebral disc with radiculopathy 11/26/2019    Class: Chronic  . Cervical disc disorder at C5-C6 level with radiculopathy 11/26/2019  . GAD (generalized anxiety disorder) 01/03/2019  . Attention deficit hyperactivity disorder (ADHD), combined type 01/03/2019  . Bipolar 2 disorder ([place]) 01/03/2019  . Precordial pain 07/07/2017  . History of renal stone 05/18/2017  . Umbilical hernia [id number]  . 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 Difficulty sleeping 05/06/2017  . Gastroesophageal reflux disease without esophagitis 05/06/2017  . History of drug overdose 05/06/2017    Gabriela Eves, PT, DPT 02/18/2020, 10:45 AM  [facility] [place], [place], [address] Phone: [phone]   Fax:  [phone]  Name: KIESA SAKAL MRN: [id number] Date of Birth: [birth year]

## 2020-02-18 NOTE — Telephone Encounter (Signed)
See mychart messages on this patient

## 2020-02-18 NOTE — Telephone Encounter (Signed)
Patient called advised she went to PT this morning and she can not move her arm at all without if hurting really bad. Patient asked if what can she do to help with getting the pain under control? Patient said she is right handed and can do anything and had to drive with her left hand. The number to contact patient is [phone]

## 2020-02-19 ENCOUNTER — Ambulatory Visit: Payer: Medicaid Other | Admitting: General Surgery

## 2020-02-19 ENCOUNTER — Other Ambulatory Visit (HOSPITAL_COMMUNITY): Payer: Self-pay | Admitting: Psychiatry

## 2020-02-19 DIAGNOSIS — H5213 Myopia, bilateral: Secondary | ICD-10-CM | POA: Diagnosis not present

## 2020-02-20 ENCOUNTER — Ambulatory Visit (HOSPITAL_COMMUNITY)
Admission: RE | Admit: 2020-02-20 | Discharge: 2020-02-20 | Disposition: A | Discharge status: Home / Self Care | Payer: Medicaid Other | Source: Ambulatory Visit | Attending: Specialist | Admitting: Specialist

## 2020-02-20 ENCOUNTER — Encounter: Payer: Self-pay | Admitting: Nurse Practitioner

## 2020-02-20 ENCOUNTER — Other Ambulatory Visit: Payer: Self-pay

## 2020-02-20 ENCOUNTER — Encounter: Payer: Self-pay | Admitting: Specialist

## 2020-02-20 DIAGNOSIS — M25511 Pain in right shoulder: Secondary | ICD-10-CM

## 2020-02-20 IMAGING — MR MR SHOULDER*R* W/O CM
5 series · 40 of 40 positions shown · non-contrast
Comparison: Radiographs [DATE]

CLINICAL DATA: Right shoulder pain since injury 1 month ago.

EXAM:
MRI OF THE RIGHT SHOULDER WITHOUT CONTRAST
TECHNIQUE: Multiplanar, multisequence MR imaging of the shoulder was performed.
No intravenous contrast was administered.

[Series 9: T2 fat-sat · axial · right · 4.0mm · 0.47mm/px · z∈[-7,+112]mm · 11 of 26 slices shown (1 of 3)]
[im 1/26]
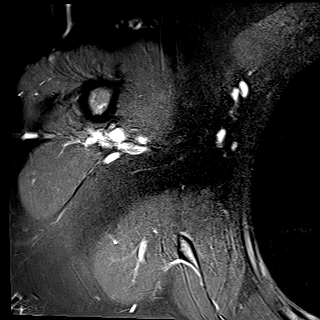
[im 3/26]
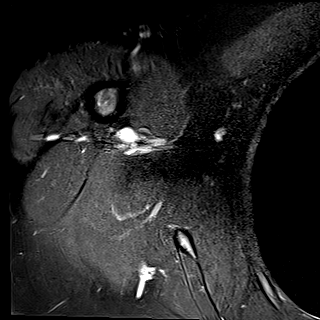
[im 6/26]
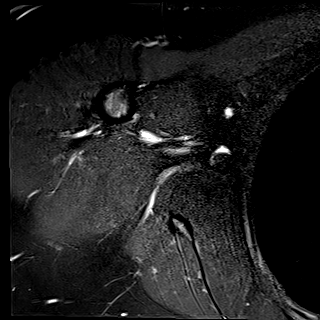
[im 8/26]
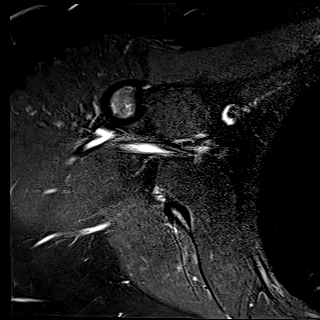
[im 11/26]
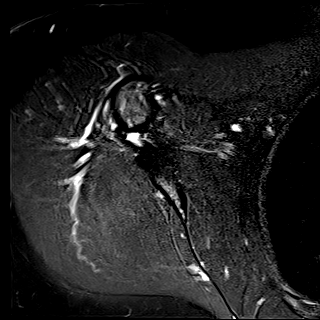
[im 13/26]
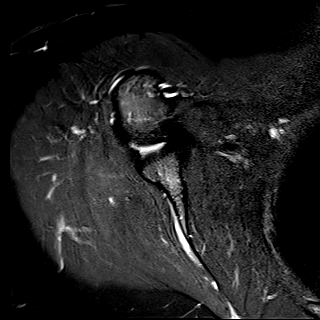
[im 16/26]
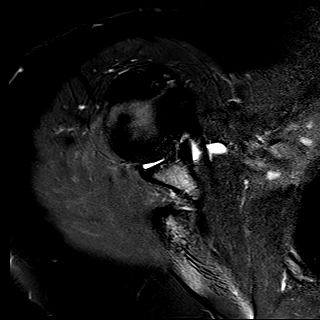
[im 18/26]
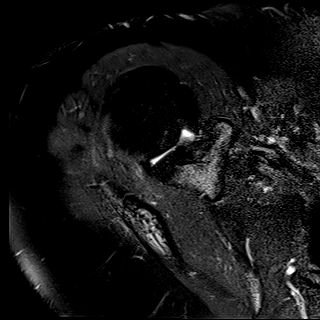
[im 21/26]
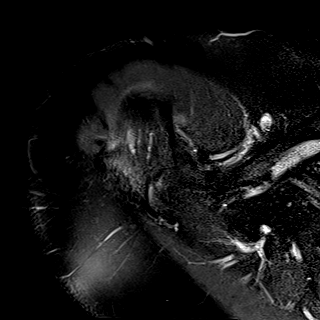
[im 23/26]
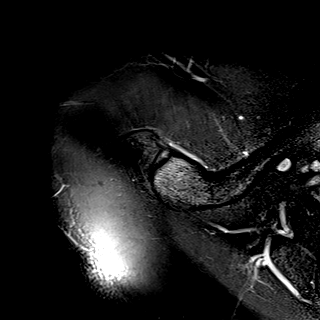
[im 26/26]
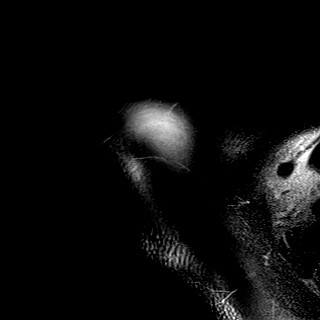

[Series 10: T1 · coronal · right · 4.0mm · 0.41mm/px · 8 of 19 slices shown]
[im 1/19]
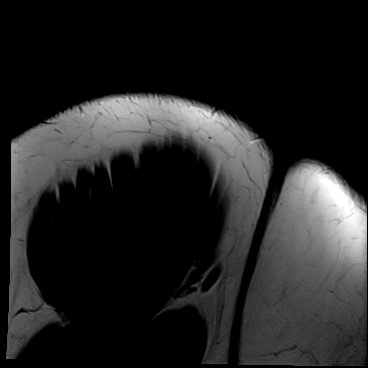
[im 3/19]
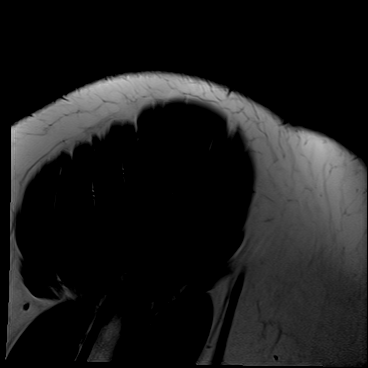
[im 6/19]
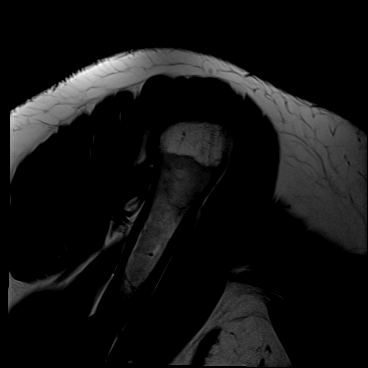
[im 8/19]
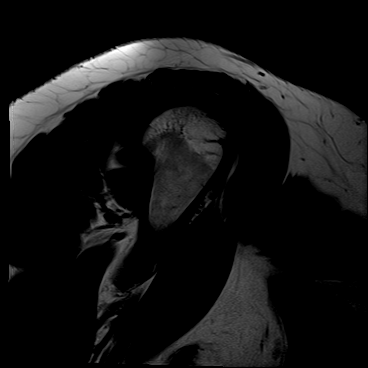
[im 11/19]
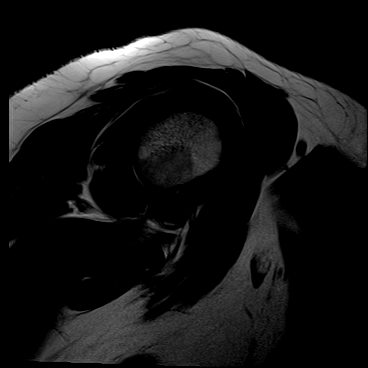
[im 13/19]
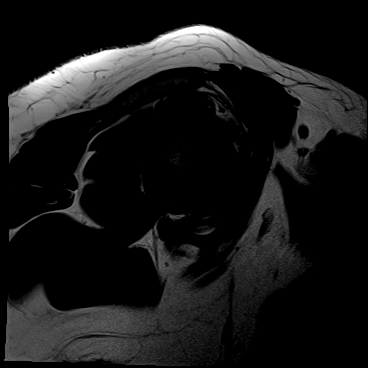
[im 16/19]
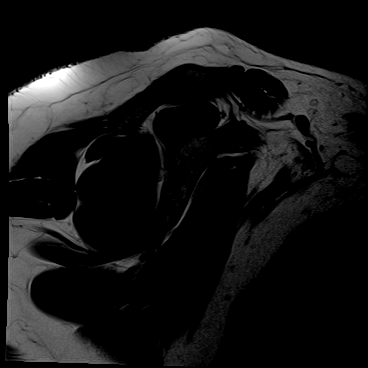
[im 19/19]
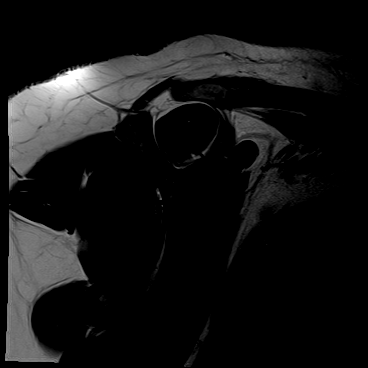

[Series 11: T2 fat-sat · coronal · right · 4.0mm · 0.47mm/px · 7 of 19 slices shown (2 of 3)]
[im 1/19]
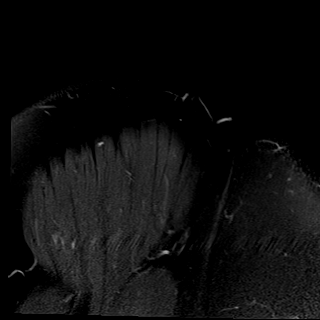
[im 4/19]
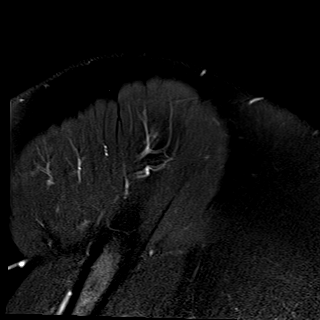
[im 7/19]
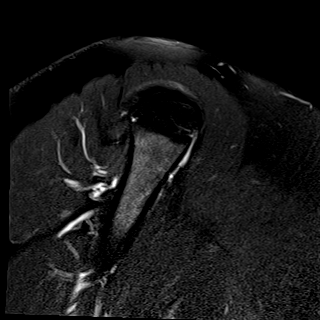
[im 10/19]
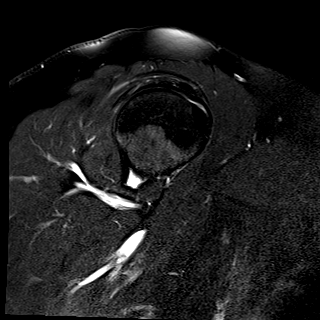
[im 13/19]
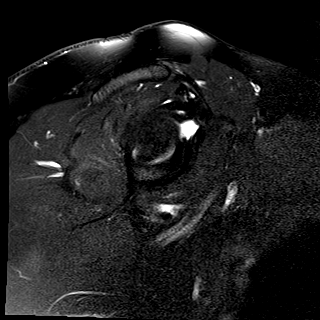
[im 16/19]
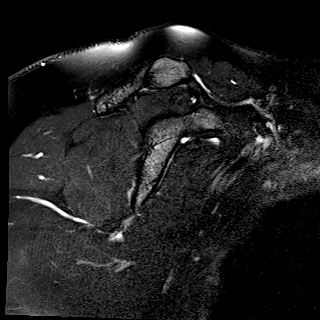
[im 19/19]
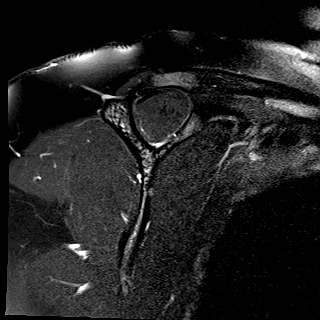

[Series 12: T2 fat-sat · oblique · right · 4.0mm · 0.47mm/px · 7 of 19 slices shown (3 of 3)]
[im 1/19]
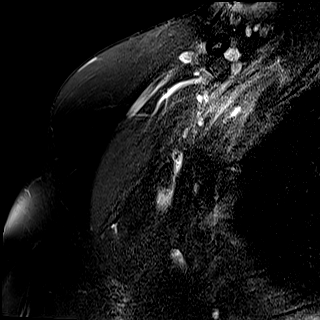
[im 4/19]
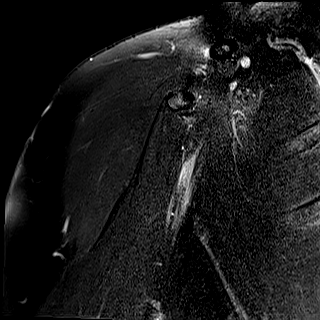
[im 7/19]
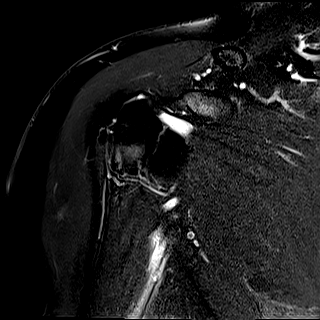
[im 10/19]
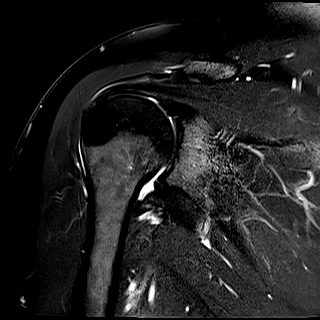
[im 13/19]
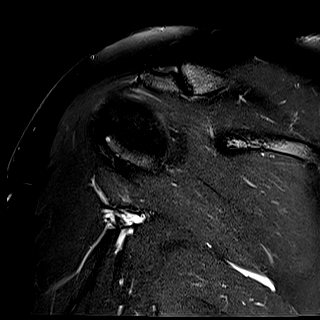
[im 16/19]
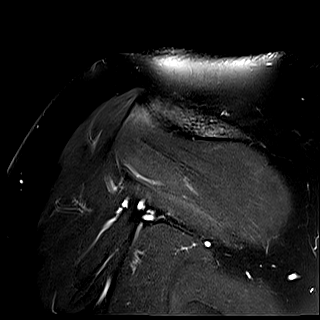
[im 19/19]
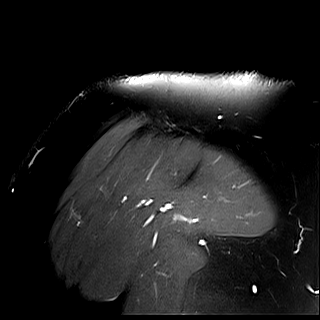

[Series 13: PD · oblique · right · 4.0mm · 0.43mm/px · 7 of 19 slices shown]
[im 1/19]
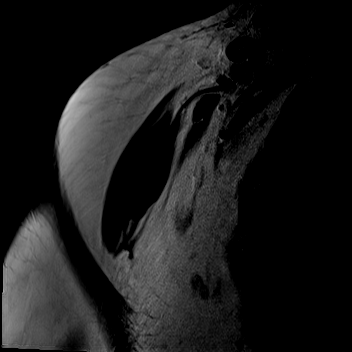
[im 4/19]
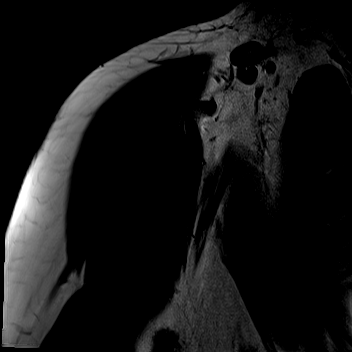
[im 7/19]
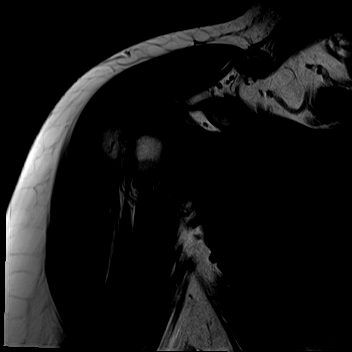
[im 10/19]
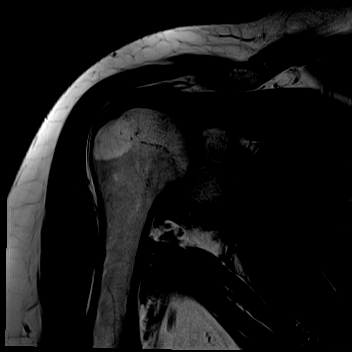
[im 13/19]
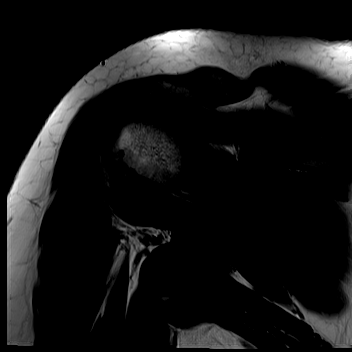
[im 16/19]
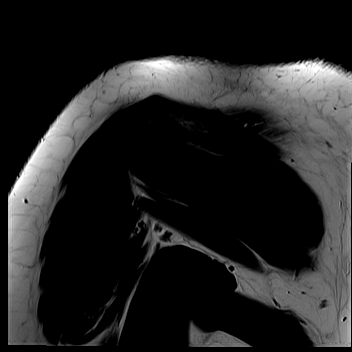
[im 19/19]
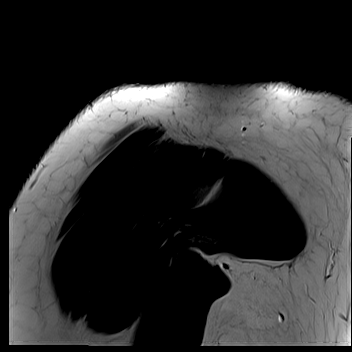

[40 of 40 positions shown; findings below may reference images not displayed]

FINDINGS: Rotator cuff:  Intact without significant tendinosis.

Muscles:  No focal muscular atrophy or edema.

Biceps long head:  Intact and normally positioned.

Acromioclavicular Joint: The acromion is type 2. The
acromioclavicular joint appears normal. No significant fluid is
present in the subacromial - subdeltoid bursa.

Glenohumeral Joint: No significant shoulder joint effusion or
glenohumeral arthropathy.

Labrum:  No evidence of labral tear or paralabral cyst.

Bones: No acute or significant extra-articular osseous findings.

Other: No significant soft tissue findings.
IMPRESSION: Normal examination.

## 2020-02-21 ENCOUNTER — Encounter: Payer: Self-pay | Admitting: Specialist

## 2020-02-21 ENCOUNTER — Telehealth: Payer: Self-pay | Admitting: Nurse Practitioner

## 2020-02-21 NOTE — Telephone Encounter (Signed)
Copied from [place] [phone]. Topic: General - Other >> Feb 21, 2020 11:04 AM Leward Quan A wrote: Reason for CRM: Patient called in to inquire of [name] if she can please refer her to a new OBGYN that accepts Medicaid. Per patient she went to the ER and found out that she had a miscarriage even though she had a tubal ligation a while ago. Does not want to see the OB GYN  she have anymore. Please advise  Ph# [phone]   Spoke with Patient to let her know a referral was already sent. Per patient she spoke with [facility] and they do not accept Medicaid. She will need a new referral to a OBGYN that accepts medicaid. She also stated that she will need a reversal of her tubal ligation but if not able to receive this from an OBGYN she will call [facility] back. Patient asked for a call back at [phone]

## 2020-02-24 NOTE — Telephone Encounter (Signed)
Medicaid does not pay for tubal reversal. What exactly is she requesting a referral to OB GYN for? They will need to know specifically why she needs to see them aside from a tubal reversal which would be out of pocket cost.

## 2020-02-27 ENCOUNTER — Ambulatory Visit: Payer: Self-pay

## 2020-02-27 ENCOUNTER — Ambulatory Visit (INDEPENDENT_AMBULATORY_CARE_PROVIDER_SITE_OTHER): Payer: Medicaid Other | Admitting: Specialist

## 2020-02-27 ENCOUNTER — Encounter: Payer: Self-pay | Admitting: Specialist

## 2020-02-27 VITALS — BP 112/77 | HR 93 | Ht 62.0 in | Wt 212.3 lb

## 2020-02-27 DIAGNOSIS — Z9889 Other specified postprocedural states: Secondary | ICD-10-CM | POA: Diagnosis not present

## 2020-02-27 DIAGNOSIS — S43011A Anterior subluxation of right humerus, initial encounter: Secondary | ICD-10-CM

## 2020-02-27 DIAGNOSIS — M25511 Pain in right shoulder: Secondary | ICD-10-CM | POA: Diagnosis not present

## 2020-02-27 NOTE — Patient Instructions (Signed)
Plan: Avoid overhead lifting and overhead use of the arms. Do not lift greater than 5 lbs. Adjust head rest in vehicle to prevent hyperextension if rear ended. Take extra precautions to avoid falling. Avoid overhead lifting and overhead use of the arms. Pillows to keep from sleeping directly on the shoulders Limited lifting to less than 10 lbs. Ice or heat for relief. Use of motrin or alleve may be of benefit for treatment of pain related to cervical disc degeneration and right shoulder pain due to  Subluxation.  Stretching exercise help and strengthening is helpful to build endurance.

## 2020-02-27 NOTE — Telephone Encounter (Signed)
Attempt to reach patient to inform on PCP advising. No answer and LVM for call back.  

## 2020-02-27 NOTE — Progress Notes (Signed)
Post-Op Visit Note   Patient: Kellie Martinez           Date of Birth: Oct 12, 1995           MRN: [id number] Visit Date: 02/27/2020 PCP: Gildardo Pounds, NP   Assessment & Plan:3 months post op Cervical disc arthroplasty C5-6, this is healed. Right shoulder pain, MRI is negative for surgical lesion, but the exam and the MRI show  That there is likely anterior shoulder instability as a cause of persistent shoulder pain. This in normally treated with therapy and if no improvement a shoulder stabilizing procedure.    Chief Complaint:  Chief Complaint  Patient presents with  . Right Shoulder - Follow-up  . Neck - Pain   Visit Diagnoses:  1. Right shoulder pain, unspecified chronicity   2. S/P cervical disc replacement     Plan: Avoid overhead lifting and overhead use of the arms. Do not lift greater than 5 lbs. Adjust head rest in vehicle to prevent hyperextension if rear ended. Take extra precautions to avoid falling. Avoid overhead lifting and overhead use of the arms. Pillows to keep from sleeping directly on the shoulders Limited lifting to less than 10 lbs. Ice or heat for relief. Use of motrin or alleve may be of benefit for treatment of pain related to cervical disc degeneration and right shoulder pain due to  Subluxation.  Stretching exercise help and strengthening is helpful to build endurance.   Follow-Up Instructions: No follow-ups on file.   Orders:  Orders Placed This Encounter  Procedures  . XR Cervical Spine 2 or 3 views   No orders of the defined types were placed in this encounter.   Imaging: No results found.  PMFS History: Patient Active Problem List   Diagnosis Date Noted  . Herniation of cervical intervertebral disc with radiculopathy 11/26/2019    Priority: High    Class: Chronic  . Cervical disc disorder at C5-C6 level with radiculopathy 11/26/2019  . GAD (generalized anxiety disorder) 01/03/2019  . Attention deficit hyperactivity  disorder (ADHD), combined type 01/03/2019  . Bipolar 2 disorder ([place]) 01/03/2019  . Precordial pain 07/07/2017  . History of renal stone 05/18/2017  . Umbilical hernia 76/16/0737  . 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 Difficulty sleeping 05/06/2017  . Gastroesophageal reflux disease without esophagitis 05/06/2017  . History of drug overdose 05/06/2017   Past Medical History:  Diagnosis Date  . Anemia   . Anxiety   . Arthritis    lower back  . Asthma   . Bipolar 1 disorder ([place])   . 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  . Chronic kidney disease   . Depression   . GERD (gastroesophageal reflux disease)   . Headache    otc med prn  . History of kidney stones   . HLD (hyperlipidemia)    diet controlled  . Molar pregnancy   . Pneumonia    x 1 yrs ago per patient 11/22/19  . Renal calculus or stone    had stent/ lithotripsy in past  . Sleep apnea    does not use cpap  . Substance abuse ([place])   . Suicide attempt ([place])    x 2    Family History  Problem Relation Age of Onset  . Depression Mother   . Asthma Mother   . Hypertension Mother   . Mood Disorder Mother   . Heart disease Father   . Kidney disease Father   . Alcoholism  Father   . Breast cancer Paternal Aunt   . Breast cancer Maternal Grandmother   . Breast cancer Cousin   . Alcoholism Brother   . Drug abuse Brother     Past Surgical History:  Procedure Laterality Date  . CERVICAL DISC ARTHROPLASTY N/A 11/26/2019   Procedure: CERVICAL DISC ARTHROPLASTY C5-6 WITH PRO [place];  Surgeon: Jessy Oto, MD;  Location: [facility];  Service: Orthopedics;  Laterality: N/A;  . CYSTOSCOPY     x2 with stone extraction  . EXTRACORPOREAL SHOCK WAVE LITHOTRIPSY Right 06/20/2013   Procedure: EXTRACORPOREAL SHOCK WAVE LITHOTRIPSY (ESWL);  Surgeon: Edwin Dada, MD;  Location: [facility];  Service: Urology;  Laterality: Right;  . FOOT SURGERY Left 2003  . LITHOTRIPSY    . TUBAL LIGATION  2017  . TUBAL LIGATION      Social History   Occupational History  . Not on file  Tobacco Use  . Smoking status: Current Every Day Smoker    Packs/day: 0.25    Types: Cigarettes  . Smokeless tobacco: Never Used  . Tobacco comment: 3 cig/day  Vaping Use  . Vaping Use: Former  . Quit date: 02/01/2019  Substance and Sexual Activity  . Alcohol use: Yes    Comment:  weekends wine/liquor  . Drug use: Not Currently    Types: Marijuana    Comment: Last use - 11/17/19  . Sexual activity: Yes    Birth control/protection: Surgical    Comment: Tubal

## 2020-02-28 ENCOUNTER — Other Ambulatory Visit: Payer: Self-pay

## 2020-02-28 ENCOUNTER — Telehealth (INDEPENDENT_AMBULATORY_CARE_PROVIDER_SITE_OTHER): Payer: Medicaid Other | Admitting: Psychiatry

## 2020-02-28 DIAGNOSIS — F902 Attention-deficit hyperactivity disorder, combined type: Secondary | ICD-10-CM

## 2020-02-28 DIAGNOSIS — F3181 Bipolar II disorder: Secondary | ICD-10-CM

## 2020-02-28 DIAGNOSIS — F411 Generalized anxiety disorder: Secondary | ICD-10-CM

## 2020-02-28 MED ORDER — LAMOTRIGINE 150 MG PO TABS: 150.0000 mg | ORAL_TABLET | Freq: Two times a day (BID) | ORAL | 2 refills | Status: DC

## 2020-02-28 MED ORDER — CLONAZEPAM 1 MG PO TABS: 1.0000 mg | ORAL_TABLET | Freq: Three times a day (TID) | ORAL | 2 refills | Status: DC | PRN

## 2020-02-28 NOTE — Progress Notes (Signed)
BH MD/PA/NP OP Progress Note  02/28/2020 9:35 AM Kellie Martinez  MRN:  [id number] Interview was conducted by phone and I verified that I was speaking with the correct person using two identifiers. I discussed the limitations of evaluation and management by telemedicine and  the availability of in person appointments. Patient expressed understanding and agreed to proceed. Participants in the visit: patient (location - home); physician (location - home office).  Chief Complaint: More anxious.  HPI: 25yo DWFwithbipolar disorder and ADHD(the latter diagnosedwhen she was 27-80 years old). Unfortunately she does not remember medications she has been tried for either of these conditions just says that "there were many". She seems to recall names of Ritalin and Adderall. Available record also shows that she was tried on SSRIs (citalopram, fluoxetine), aripiprazole, oxcarbazepine, hydroxyzine, clonazepam. She describes her mood swings as being very rapid - happening several times during the day. WestartedLamictal titration and escitalopram 10 mg daily. Initially she had muscle cramps and some sedation but both have resolved.She also complainedof difficulty with falling asleep - trazodone has not been very effective for that (150 mg)andregularAmbien only helpedher fall asleep but she is up after few hours.I have ordered Ambien CRbut it was not approved by her insurance.She also failedtemazepam, eszopiclone, mirtazapine and quetiapine (developed muscle twitching on it).We have added Adderall XR 20 mgandwhile she foundit helpful for concentrationis was notyet optimal. We increased dose to 30 mg and she reacted with increased anxiety(thesame happened when we tried Concerta 27 mg) but tolerates 25 mg well.She complainedof feeling stressed out, anxious often and we have added clonazepam for anxiety - she finds it effective. She has lost her grandmother few weeks ago - more anxious. Sleep has  improved after addition of Sonata.     Visit Diagnosis:    ICD-10-CM   1. GAD (generalized anxiety disorder)  F41.1   2. Attention deficit hyperactivity disorder (ADHD), combined type  F90.2   3. Bipolar 2 disorder (HCC)  F31.81     Past Psychiatric History: Please see intake H&P.  Past Medical History:  Past Medical History:  Diagnosis Date  . Anemia   . Anxiety   . Arthritis    lower back  . Asthma   . Bipolar 1 disorder ([place])   . 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  . Chronic kidney disease   . Depression   . GERD (gastroesophageal reflux disease)   . Headache    otc med prn  . History of kidney stones   . HLD (hyperlipidemia)    diet controlled  . Molar pregnancy   . Pneumonia    x 1 yrs ago per patient 11/22/19  . Renal calculus or stone    had stent/ lithotripsy in past  . Sleep apnea    does not use cpap  . Substance abuse ([place])   . Suicide attempt ([place])    x 2    Past Surgical History:  Procedure Laterality Date  . CERVICAL DISC ARTHROPLASTY N/A 11/26/2019   Procedure: CERVICAL DISC ARTHROPLASTY C5-6 WITH PRO [place];  Surgeon: Jessy Oto, MD;  Location: [facility];  Service: Orthopedics;  Laterality: N/A;  . CYSTOSCOPY     x2 with stone extraction  . EXTRACORPOREAL SHOCK WAVE LITHOTRIPSY Right 06/20/2013   Procedure: EXTRACORPOREAL SHOCK WAVE LITHOTRIPSY (ESWL);  Surgeon: Edwin Dada, MD;  Location: [facility];  Service: Urology;  Laterality: Right;  . FOOT SURGERY Left 2003  . LITHOTRIPSY    . TUBAL LIGATION  2017  . TUBAL LIGATION  Family Psychiatric History: Reviewed.  Family History:  Family History  Problem Relation Age of Onset  . Depression Mother   . Asthma Mother   . Hypertension Mother   . Mood Disorder Mother   . Heart disease Father   . Kidney disease Father   . Alcoholism Father   . Breast cancer Paternal Aunt   . Breast cancer Maternal Grandmother   . Breast cancer Cousin   . Alcoholism Brother   . Drug abuse Brother     Social  History:  Social History   Socioeconomic History  . Marital status: Single    Spouse name: Not on file  . Number of children: 3  . Years of education: Not on file  . Highest education level: Not on file  Occupational History  . Not on file  Tobacco Use  . Smoking status: Current Every Day Smoker    Packs/day: 0.25    Types: Cigarettes  . Smokeless tobacco: Never Used  . Tobacco comment: 3 cig/day  Vaping Use  . Vaping Use: Former  . Quit date: 02/01/2019  Substance and Sexual Activity  . Alcohol use: Yes    Comment:  weekends wine/liquor  . Drug use: Not Currently    Types: Marijuana    Comment: Last use - 11/17/19  . Sexual activity: Yes    Birth control/protection: Surgical    Comment: Tubal  Other Topics Concern  . Not on file  Social History Narrative  . Not on file   Social Determinants of Health   Financial Resource Strain: Not on file  Food Insecurity: Not on file  Transportation Needs: Not on file  Physical Activity: Not on file  Stress: Not on file  Social Connections: Not on file    Allergies:  Allergies  Allergen Reactions  . Levofloxacin   . Penicillins Hives    No anaphylaxis  Did it involve swelling of the face/tongue/throat, SOB, or low BP? No Did it involve sudden or severe rash/hives, skin peeling, or any reaction on the inside of your mouth or nose? Yes Did you need to seek medical attention at a hospital or doctor's office? Yes When did it last happen?Childhood If all above answers are "NO", may proceed with cephalosporin use.   Marland Kitchen Phenergan [Promethazine Hcl]     Tremors, restless lef, akathesia [id number]    Metabolic Disorder Labs: Lab Results  Component Value Date   HGBA1C 5.1 07/04/2019   No results found for: PROLACTIN Lab Results  Component Value Date   CHOL 108 03/13/2018   TRIG 45 03/13/2018   HDL 35 (L) 03/13/2018   CHOLHDL 3.1 03/13/2018   [place] 64 03/13/2018   Lab Results  Component Value Date   TSH  1.170 03/13/2018    Therapeutic Level Labs: No results found for: LITHIUM No results found for: VALPROATE No components found for:  CBMZ  Current Medications: Current Outpatient Medications  Medication Sig Dispense Refill  . clonazePAM (KLONOPIN) 1 MG tablet Take 1 tablet (1 mg total) by mouth 3 (three) times daily as needed for anxiety. 90 tablet 2  . [START ON 03/21/2020] amphetamine-dextroamphetamine (ADDERALL XR) 25 MG 24 hr capsule Take 1 capsule by mouth every morning. 30 capsule 0  . escitalopram (LEXAPRO) 20 MG tablet Take 1.5 tablets (30 mg total) by mouth daily. 45 tablet 2  . HYDROcodone-acetaminophen (NORCO/VICODIN) 5-325 MG tablet Take 1 tablet by mouth every 6 (six) hours as needed for moderate pain. 30 tablet 0  . lidocaine (XYLOCAINE)  2 % solution Use as directed 15 mLs in the mouth or throat every 6 (six) hours as needed for mouth pain. 200 mL 1  . methylPREDNISolone (MEDROL) 4 MG tablet 6 day taper to be taken as directed. 21 tablet 0  . zaleplon (SONATA) 10 MG capsule Take 1 capsule (10 mg total) by mouth at bedtime as needed for sleep. 30 capsule 1   No current facility-administered medications for this visit.      Psychiatric Specialty Exam: Review of Systems  Psychiatric/Behavioral: The patient is nervous/anxious.   All other systems reviewed and are negative.   There were no vitals taken for this visit.There is no height or weight on file to calculate BMI.  General Appearance: NA  Eye Contact:  NA  Speech:  Clear and Coherent and Normal Rate  Volume:  Normal  Mood:  Anxious and Depressed  Affect:  NA  Thought Process:  Goal Directed  Orientation:  Full (Time, Place, and Person)  Thought Content: Logical   Suicidal Thoughts:  No  Homicidal Thoughts:  No  Memory:  Immediate;   Fair Recent;   Fair Remote;   Fair  Judgement:  Good  Insight:  Fair  Psychomotor Activity:  NA  Concentration:  Concentration: Fair  Recall:  [place] of Knowledge: Fair   Language: Good  Akathisia:  Negative  Handed:  Right  AIMS (if indicated): not done  Assets:  Communication Skills Desire for Improvement Housing Social Support  ADL's:  Intact  Cognition: WNL  Sleep:  Good   Screenings: GAD-7   Flowsheet Row Office Visit from 08/03/2019 in [facility] Office Visit from 07/04/2019 in [facility] Office Visit from 02/20/2018 in Primary Care at [facility]  Total GAD-7 Score 20 20 18     PHQ2-9   [facility] Office Visit from 01/28/2020 in [facility] Office Visit from 08/03/2019 in [facility] Office Visit from 07/04/2019 in [facility] Office Visit from 02/20/2018 in Primary Care at [facility] Visit from 05/06/2017 in [facility]  PHQ-2 Total Score 4 2 4 3 2   PHQ-9 Total Score 18 11 20 16 12        Assessment and Plan: 24yo DWFwithbipolar disorder and ADHD(the latter diagnosedwhen she was 55-33 years old). Unfortunately she does not remember medications she has been tried for either of these conditions just says that "there were many". She seems to recall names of Ritalin and Adderall. Available record also shows that she was tried on SSRIs (citalopram, fluoxetine), aripiprazole, oxcarbazepine, hydroxyzine, clonazepam. She describes her mood swings as being very rapid - happening several times during the day. WestartedLamictal titration and escitalopram 10 mg daily. Initially she had muscle cramps and some sedation but both have resolved.She also complainedof difficulty with falling asleep - trazodone has not been very effective for that (150 mg)andregularAmbien only helpedher fall asleep but she is up after few hours.I have ordered Ambien CRbut it was not approved by her insurance.She also failedtemazepam, eszopiclone, mirtazapine and quetiapine (developed muscle  twitching on it).We have added Adderall XR 20 mgandwhile she foundit helpful for concentrationis was notyet optimal. We increased dose to 30 mg and she reacted with increased anxiety(thesame happened when we tried Concerta 27 mg) but tolerates 25 mg well.She complainedof feeling stressed out, anxious often and we have added clonazepam for anxiety - she finds it effective. She  has lost her grandmother few weeks ago - more anxious. Sleep has improved after addition of Sonata.   Dx: Bipolar 2ultra rapid cycling; GAD; ADHD  Plan:Continueescitalopram 30mg  for anxiety,Lamictal 150 mg bid, Adderall XR25 mg, Sonata 10 mg prn insomnia andincrease clonazepam 1 mg to tid prn anxiety.Next appointment inone month.The plan was discussed with patient who had an opportunity to ask questions and these were all answered. I spend24minutes inphoneconsultation with the patient.   Stephanie Acre, MD 02/28/2020, 9:35 AM

## 2020-03-02 ENCOUNTER — Ambulatory Visit
Admission: EM | Admit: 2020-03-02 | Discharge: 2020-03-02 | Disposition: A | Discharge status: Home / Self Care | Payer: Medicaid Other | Attending: Family Medicine | Admitting: Family Medicine

## 2020-03-02 ENCOUNTER — Other Ambulatory Visit: Payer: Self-pay

## 2020-03-02 DIAGNOSIS — Z1152 Encounter for screening for COVID-19: Secondary | ICD-10-CM

## 2020-03-02 DIAGNOSIS — Z20822 Contact with and (suspected) exposure to covid-19: Secondary | ICD-10-CM | POA: Diagnosis not present

## 2020-03-02 NOTE — ED Triage Notes (Signed)
Here for covid test only 

## 2020-03-03 LAB — SARS-COV-2, NAA 2 DAY TAT

## 2020-03-03 LAB — NOVEL CORONAVIRUS, NAA: SARS-CoV-2, NAA: DETECTED — AB

## 2020-03-04 DIAGNOSIS — H52223 Regular astigmatism, bilateral: Secondary | ICD-10-CM | POA: Diagnosis not present

## 2020-03-04 DIAGNOSIS — H5213 Myopia, bilateral: Secondary | ICD-10-CM | POA: Diagnosis not present

## 2020-03-11 ENCOUNTER — Ambulatory Visit (INDEPENDENT_AMBULATORY_CARE_PROVIDER_SITE_OTHER): Payer: Medicaid Other | Admitting: General Surgery

## 2020-03-11 ENCOUNTER — Encounter: Payer: Self-pay | Admitting: Nurse Practitioner

## 2020-03-11 ENCOUNTER — Other Ambulatory Visit: Payer: Self-pay

## 2020-03-11 ENCOUNTER — Encounter: Payer: Self-pay | Admitting: General Surgery

## 2020-03-11 VITALS — BP 110/74 | HR 103 | Temp 97.0°F | Resp 16 | Ht 62.0 in | Wt 210.0 lb

## 2020-03-11 DIAGNOSIS — K429 Umbilical hernia without obstruction or gangrene: Secondary | ICD-10-CM | POA: Diagnosis not present

## 2020-03-11 NOTE — Patient Instructions (Signed)
Umbilical Hernia, Adult  A hernia is a bulge of tissue that pushes through an opening between muscles. An umbilical hernia happens in the abdomen, near the belly button (umbilicus). The hernia may contain tissues from the small intestine, large intestine, or fatty tissue covering the intestines (omentum). Umbilical hernias in adults tend to get worse over time, and they require surgical treatment. There are several types of umbilical hernias. You may have:  A hernia located just above or below the umbilicus (indirect hernia). This is the most common type of umbilical hernia in adults.  A hernia that forms through an opening formed by the umbilicus (direct hernia).  A hernia that comes and goes (reducible hernia). A reducible hernia may be visible only when you strain, lift something heavy, or cough. This type of hernia can be pushed back into the abdomen (reduced).  A hernia that traps abdominal tissue inside the hernia (incarcerated hernia). This type of hernia cannot be reduced.  A hernia that cuts off blood flow to the tissues inside the hernia (strangulated hernia). The tissues can start to die if this happens. This type of hernia requires emergency treatment. What are the causes? An umbilical hernia happens when tissue inside the abdomen presses on a weak area of the abdominal muscles. What increases the risk? You may have a greater risk of this condition if you:  Are obese.  Have had several pregnancies.  Have a buildup of fluid inside your abdomen (ascites).  Have had surgery that weakens the abdominal muscles. What are the signs or symptoms? The main symptom of this condition is a painless bulge at or near the belly button. A reducible hernia may be visible only when you strain, lift something heavy, or cough. Other symptoms may include:  Dull pain.  A feeling of pressure. Symptoms of a strangulated hernia may include:  Pain that gets increasingly worse.  Nausea and  vomiting.  Pain when pressing on the hernia.  Skin over the hernia becoming red or purple.  Constipation.  Blood in the stool. How is this diagnosed? This condition may be diagnosed based on:  A physical exam. You may be asked to cough or strain while standing. These actions increase the pressure inside your abdomen and force the hernia through the opening in your muscles. Your health care provider may try to reduce the hernia by pressing on it.  Your symptoms and medical history. How is this treated? Surgery is the only treatment for an umbilical hernia. Surgery for a strangulated hernia is done as soon as possible. If you have a small hernia that is not incarcerated, you may need to lose weight before having surgery. Follow these instructions at home:  Lose weight, if told by your health care provider.  Do not try to push the hernia back in.  Watch your hernia for any changes in color or size. Tell your health care provider if any changes occur.  You may need to avoid activities that increase pressure on your hernia.  Do not lift anything that is heavier than 10 lb (4.5 kg) until your health care provider says that this is safe.  Take over-the-counter and prescription medicines only as told by your health care provider.  Keep all follow-up visits as told by your health care provider. This is important. Contact a health care provider if:  Your hernia gets larger.  Your hernia becomes painful. Get help right away if:  You develop sudden, severe pain near the area of your hernia.    You have pain as well as nausea or vomiting.  You have pain and the skin over your hernia changes color.  You develop a fever. This information is not intended to replace advice given to you by your health care provider. Make sure you discuss any questions you have with your health care provider. Document Revised: 03/09/2017 Document Reviewed: 07/26/2016 Elsevier Patient Education  2021  [employer]    Open Hernia Repair, Adult Open hernia repair is a surgical procedure to fix a hernia. A hernia occurs when an internal organ or tissue pushes through a weak spot in the muscles along the wall of the abdomen. Hernias commonly occur in the groin and around the belly button. Most hernias tend to get worse over time. Often, surgery is done to prevent the hernia from becoming bigger, uncomfortable, or an emergency. Emergency surgery may be needed if contents of the abdomen get stuck in the opening (incarcerated hernia) or if the blood supply gets cut off (strangulated hernia). In an open repair, an incision is made in the abdomen to perform the surgery. Tell a health care provider about:  Any allergies you have.  All medicines you are taking, including vitamins, herbs, eye drops, creams, and over-the-counter medicines.  Any problems you or family members have had with anesthetic medicines.  Any blood or bone disorders you have.  Any surgeries you have had.  Any medical conditions you have, including any recent cold or flu (influenza)symptoms.  Whether you are pregnant or may be pregnant. What are the risks? Generally, this is a safe procedure. However, problems may occur, including:  Long-lasting (chronic) pain.  Bleeding.  Infection.  Damage to the testicles. This can cause shrinking or swelling.  Damage to nearby structures or organs, including the bladder, blood vessels, intestines, or nerves near the hernia.  Blood clots.  Trouble passing urine.  Return of the hernia. Medicines Ask your health care provider about:  Changing or stopping your regular medicines. This is especially important if you are taking diabetes medicines or blood thinners.  Taking medicines such as aspirin and ibuprofen. These medicines can thin your blood. Do not take these medicines unless your health care provider tells you to take them.  Taking over-the-counter medicines,  vitamins, herbs, and supplements. Surgery safety Ask your health care provider:  How your surgery site will be marked.  What steps will be taken to help prevent infection. These steps may include: ? Removing hair at the surgery site. ? Washing skin with a germ-killing soap. ? Receiving antibiotic medicine. General instructions  You may have an exam or testing, such as blood tests or imaging studies.  Do not use any products that contain nicotine or tobacco for at least 4 weeks before the procedure. These products include cigarettes, chewing tobacco, and vaping devices, such as e-cigarettes. If you need help quitting, ask your health care provider.  Let your health care provider know if you develop a cold or any infection before your surgery. If you get an infection before surgery, you may receive antibiotics to treat it.  Plan to have a responsible adult take you home from the hospital or clinic.  If you will be going home right after the procedure, plan to have a responsible adult care for you for the time you are told. This is important. What happens during the procedure?  An IV will be inserted into one of your veins.  You will be given one or more of the following: ? A medicine   to help you relax (sedative). ? A medicine to numb the area (local anesthetic). ? A medicine to make you fall asleep (general anesthetic).  Your surgeon will make an incision over the hernia.  The tissues of the hernia will be moved back into place.  The edges of the hernia may be stitched (sutured) together.  The opening in the abdominal muscles will be closed with stitches (sutures). Or, your surgeon will place a mesh patch made of artificial (synthetic) material over the opening.  The incision will be closed with sutures, skin glue, or adhesive strips.  A bandage (dressing) may be placed over the incision. The procedure may vary among health care providers and hospitals.   What happens after  the procedure?  Your blood pressure, heart rate, breathing rate, and blood oxygen level will be monitored until you leave the hospital or clinic.  You may be given medicine for pain.  If you were given a sedative during the procedure, it can affect you for several hours. Do not drive or operate machinery until your health care provider says that it is safe. Summary  Open hernia repair is a surgical procedure to fix a hernia. Hernias commonly occur in the groin and around the belly button.  Emergency surgery may be needed if contents of the abdomen get stuck in the opening (incarcerated hernia) or if the blood supply gets cut off (strangulated hernia).  In this procedure, an incision is made in the abdomen to perform the surgery.  After the procedure, you may be given medicine for pain. This information is not intended to replace advice given to you by your health care provider. Make sure you discuss any questions you have with your health care provider. Document Revised: 09/10/2019 Document Reviewed: 09/10/2019 Elsevier Patient Education  2021 [employer].

## 2020-03-11 NOTE — Progress Notes (Signed)
[facility] History and Physical  Reason for Referral: Umbilical hernia  Referring Physician: Fleming, Zelda W, NP     Chief Complaint    New Patient (Initial Visit)      Kellie Martinez is a 24 y.o. female.  HPI: Kellie Martinez is a 24 yo with an umbilical hernia since about 2019 she reports when she was pregnant. The area was uncomfortable at that time but she suffered a miscarriage and after her pregnancy ceased the discomfort improved. She says that she has noticed the bulge is getting larger and that it pulls in the area. She has had it stuck out but has never had obstructive symptoms or had to go to the ED.   She slipped and fell on ice 2020 and had neck surgery this past fall. She is still recovering from this and the surgery occurred October 2021 she says.       Past Medical History:  Diagnosis Date  . Anemia   . Anxiety   . Arthritis    lower back  . Asthma   . Bipolar 1 disorder (HCC)   . 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  . Chronic kidney disease   . Depression   . GERD (gastroesophageal reflux disease)   . Headache    otc med prn  . History of kidney stones   . HLD (hyperlipidemia)    diet controlled  . Molar pregnancy   . Pneumonia    x 1 yrs ago per patient 11/22/19  . Renal calculus or stone    had stent/ lithotripsy in past  . Sleep apnea    does not use cpap  . Substance abuse (HCC)   . Suicide attempt (HCC)    x 2         Past Surgical History:  Procedure Laterality Date  . CERVICAL DISC ARTHROPLASTY N/A 11/26/2019   Procedure: CERVICAL DISC ARTHROPLASTY C5-6 WITH PRO DISC;  Surgeon: Nitka, James E, MD;  Location: [facility];  Service: Orthopedics;  Laterality: N/A;  . CYSTOSCOPY     x2 with stone extraction  . EXTRACORPOREAL SHOCK WAVE LITHOTRIPSY Right 06/20/2013   Procedure: EXTRACORPOREAL SHOCK WAVE LITHOTRIPSY (ESWL);  Surgeon: Joseph Carbone, MD;  Location: [facility];  Service: Urology;  Laterality: Right;   . FOOT SURGERY Left 2003  . LITHOTRIPSY    . TUBAL LIGATION  2017  . TUBAL LIGATION           Family History  Problem Relation Age of Onset  . Depression Mother   . Asthma Mother   . Hypertension Mother   . Mood Disorder Mother   . Heart disease Father   . Kidney disease Father   . Alcoholism Father   . Breast cancer Paternal Aunt   . Breast cancer Maternal Grandmother   . Breast cancer Cousin   . Alcoholism Brother   . Drug abuse Brother     Social History        Tobacco Use  . Smoking status: Current Every Day Smoker    Packs/day: 0.25    Types: Cigarettes  . Smokeless tobacco: Never Used  . Tobacco comment: 3 cig/day  Vaping Use  . Vaping Use: Former  . Quit date: 02/01/2019  Substance Use Topics  . Alcohol use: Yes    Comment:  weekends wine/liquor  . Drug use: Not Currently    Types: Marijuana    Comment: Last use - 11/17/19    Medications: I have reviewed the patient's current medications.   Allergies as of 03/11/2020      Reactions   Levofloxacin    Penicillins Hives   No anaphylaxis Did it involve swelling of the face/tongue/throat, SOB, or low BP? No Did it involve sudden or severe rash/hives, skin peeling, or any reaction on the inside of your mouth or nose? Yes Did you need to seek medical attention at a hospital or doctor's office? Yes When did it last happen?Childhood If all above answers are "NO", may proceed with cephalosporin use.   Phenergan [promethazine Hcl]    Tremors, restless lef, akathesia 01-26-2018         Medication List       Accurate as of March 11, 2020 10:18 AM. If you have any questions, ask your nurse or doctor.        STOP taking these medications   methylPREDNISolone 4 MG tablet Commonly known as: Medrol Stopped by: Lindsay C Bridges, MD     TAKE these medications   amphetamine-dextroamphetamine 25 MG 24 hr capsule Commonly known as: Adderall XR Take 1  capsule by mouth every morning. Start taking on: March 21, 2020   clonazePAM 1 MG tablet Commonly known as: KlonoPIN Take 1 tablet (1 mg total) by mouth 3 (three) times daily as needed for anxiety.   escitalopram 20 MG tablet Commonly known as: LEXAPRO Take 1.5 tablets (30 mg total) by mouth daily.   HYDROcodone-acetaminophen 5-325 MG tablet Commonly known as: NORCO/VICODIN Take 1 tablet by mouth every 6 (six) hours as needed for moderate pain.   lamoTRIgine 150 MG tablet Commonly known as: LaMICtal Take 1 tablet (150 mg total) by mouth 2 (two) times daily.   lidocaine 2 % solution Commonly known as: XYLOCAINE Use as directed 15 mLs in the mouth or throat every 6 (six) hours as needed for mouth pain.   zaleplon 10 MG capsule Commonly known as: SONATA Take 1 capsule (10 mg total) by mouth at bedtime as needed for sleep.        ROS:  A comprehensive review of systems was negative except for: Respiratory: positive for cough and wheezing Gastrointestinal: positive for abdominal pain, nausea and reflux symptoms Musculoskeletal: positive for back pain, neck pain and joint pain Neurological: positive for dizziness and headaches  Blood pressure 110/74, pulse (!) 103, temperature (!) 97 F (36.1 C), temperature source Other (Comment), resp. rate 16, height 5' 2" (1.575 m), weight 210 lb (95.3 kg), SpO2 98 %. Physical Exam Vitals reviewed.  Constitutional:      Appearance: Normal appearance.  HENT:     Head: Normocephalic.  Eyes:     Extraocular Movements: Extraocular movements intact.  Cardiovascular:     Rate and Rhythm: Normal rate.  Pulmonary:     Effort: Pulmonary effort is normal.  Abdominal:     General: There is no distension.     Palpations: Abdomen is soft.     Tenderness: There is abdominal tenderness in the periumbilical area.     Hernia: A hernia is present. Hernia is present in the umbilical area.     Comments: Reducible fat containing hernia   Musculoskeletal:        General: Normal range of motion.     Cervical back: No rigidity.  Skin:    General: Skin is warm.  Neurological:     General: No focal deficit present.     Mental Status: She is oriented to person, place, and time.  Psychiatric:        Mood and Affect:   Mood normal.        Behavior: Behavior normal.        Thought Content: Thought content normal.        Judgment: Judgment normal.     Results: None  Assessment & Plan:  Kellie Martinez is a 24 y.o. female with an umbilical hernia. She is having some discomfort and has had it for several years. She wants to proceed with repair.  -Discussed repair with mesh and risk of bleeding, infection, injury to bowel, use of mesh, and recurrence.  -Discussed preop COVID testing, she tested positive on 03/02/2020 and is vaccinated she says  -Will get her scheduled in the upcoming weeks    All questions were answered to the satisfaction of the patient.    Lindsay C Bridges 03/11/2020, 10:18 AM     

## 2020-03-12 ENCOUNTER — Encounter: Payer: Self-pay | Admitting: Specialist

## 2020-03-13 ENCOUNTER — Telehealth: Payer: Self-pay

## 2020-03-13 ENCOUNTER — Other Ambulatory Visit: Payer: Self-pay

## 2020-03-13 ENCOUNTER — Telehealth (INDEPENDENT_AMBULATORY_CARE_PROVIDER_SITE_OTHER): Payer: Medicaid Other | Admitting: Psychiatry

## 2020-03-13 ENCOUNTER — Ambulatory Visit: Payer: Medicaid Other | Admitting: Family Medicine

## 2020-03-13 DIAGNOSIS — F902 Attention-deficit hyperactivity disorder, combined type: Secondary | ICD-10-CM | POA: Diagnosis not present

## 2020-03-13 DIAGNOSIS — F411 Generalized anxiety disorder: Secondary | ICD-10-CM | POA: Diagnosis not present

## 2020-03-13 DIAGNOSIS — F3181 Bipolar II disorder: Secondary | ICD-10-CM

## 2020-03-13 MED ORDER — ZALEPLON 10 MG PO CAPS
10.0000 mg | ORAL_CAPSULE | Freq: Every evening | ORAL | 1 refills | Status: DC | PRN
Start: 2020-04-07 — End: 2020-04-16

## 2020-03-13 MED ORDER — ESCITALOPRAM OXALATE 20 MG PO TABS: 30.0000 mg | ORAL_TABLET | Freq: Every day | ORAL | 2 refills | Status: DC

## 2020-03-13 NOTE — Progress Notes (Signed)
[facility] MD/PA/NP OP Progress Note  03/13/2020 8:44 AM Kellie Martinez  MRN:  [id number] Interview was conducted by phone and I verified that I was speaking with the correct person using two identifiers. I discussed the limitations of evaluation and management by telemedicine and  the availability of in person appointments. Patient expressed understanding and agreed to proceed. Participants in the visit: patient (location - home); physician (location - home office).  Chief Complaint: Anxiety, moodiness.  HPI: 25yo DWFwithbipolar disorder and ADHD(the latter diagnosedwhen she was 67-63 years old). Unfortunately she does not remember medications she has been tried for either of these conditions just says that "there were many". She seems to recall names of Ritalin and Adderall. Available record also shows that she was tried on SSRIs (citalopram, fluoxetine), aripiprazole, oxcarbazepine, hydroxyzine, clonazepam. She describes her mood swings as being very rapid - happening several times during the day. WestartedLamictal titration and escitalopram 10 mg daily. Initially she had muscle cramps and some sedation but both have resolved.She also complainedof difficulty with falling asleep - trazodone has not been very effective for that (150 mg)andregularAmbien only helpedher fall asleep but she is up after few hours.I have ordered Ambien CRbut it was not approved by her insurance.She also failedtemazepam, eszopiclone, mirtazapineand quetiapine (developed muscle twitching on it).We have added Adderall XR 20 mgandwhile she foundit helpful for concentrationis was notyet optimal. We increased dose to 30 mg and she reacted with increased anxiety(thesame happened when we tried Concerta 27 mg) but tolerates 25 mg well.She remains anxious, "stressed out", more so since her application for disability was denied. She is asking for another letter in support of her efforts to get disability income. Clonazepam  for anxiety is effective but she needs to use it daily. Sleep has improved after addition of Sonata.     Visit Diagnosis:    ICD-10-CM   1. Bipolar 2 disorder (HCC)  F31.81   2. Attention deficit hyperactivity disorder (ADHD), combined type  F90.2   3. GAD (generalized anxiety disorder)  F41.1     Past Psychiatric History: Please see intake H&P.  Past Medical History:  Past Medical History:  Diagnosis Date  . Anemia   . Anxiety   . Arthritis    lower back  . Asthma   . Bipolar 1 disorder ([place])   . 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  . Chronic kidney disease   . Depression   . GERD (gastroesophageal reflux disease)   . Headache    otc med prn  . History of kidney stones   . HLD (hyperlipidemia)    diet controlled  . Molar pregnancy   . Pneumonia    x 1 yrs ago per patient 11/22/19  . Renal calculus or stone    had stent/ lithotripsy in past  . Sleep apnea    does not use cpap  . Substance abuse ([place])   . Suicide attempt ([place])    x 2    Past Surgical History:  Procedure Laterality Date  . CERVICAL DISC ARTHROPLASTY N/A 11/26/2019   Procedure: CERVICAL DISC ARTHROPLASTY C5-6 WITH PRO [place];  Surgeon: Jessy Oto, MD;  Location: [facility];  Service: Orthopedics;  Laterality: N/A;  . CYSTOSCOPY     x2 with stone extraction  . EXTRACORPOREAL SHOCK WAVE LITHOTRIPSY Right 06/20/2013   Procedure: EXTRACORPOREAL SHOCK WAVE LITHOTRIPSY (ESWL);  Surgeon: Edwin Dada, MD;  Location: [facility];  Service: Urology;  Laterality: Right;  . FOOT SURGERY Left 2003  . LITHOTRIPSY    .  TUBAL LIGATION  2017  . TUBAL LIGATION      Family Psychiatric History: Reviewed.  Family History:  Family History  Problem Relation Age of Onset  . Depression Mother   . Asthma Mother   . Hypertension Mother   . Mood Disorder Mother   . Heart disease Father   . Kidney disease Father   . Alcoholism Father   . Breast cancer Paternal Aunt   . Breast cancer Maternal Grandmother   . Breast cancer Cousin    . Alcoholism Brother   . Drug abuse Brother     Social History:  Social History   Socioeconomic History  . Marital status: Single    Spouse name: Not on file  . Number of children: 3  . Years of education: Not on file  . Highest education level: Not on file  Occupational History  . Not on file  Tobacco Use  . Smoking status: Current Every Day Smoker    Packs/day: 0.25    Types: Cigarettes  . Smokeless tobacco: Never Used  . Tobacco comment: 3 cig/day  Vaping Use  . Vaping Use: Former  . Quit date: 02/01/2019  Substance and Sexual Activity  . Alcohol use: Yes    Comment:  weekends wine/liquor  . Drug use: Not Currently    Types: Marijuana    Comment: Last use - 11/17/19  . Sexual activity: Yes    Birth control/protection: Surgical    Comment: Tubal  Other Topics Concern  . Not on file  Social History Narrative  . Not on file   Social Determinants of Health   Financial Resource Strain: Not on file  Food Insecurity: Not on file  Transportation Needs: Not on file  Physical Activity: Not on file  Stress: Not on file  Social Connections: Not on file    Allergies:  Allergies  Allergen Reactions  . Levofloxacin   . Penicillins Hives    No anaphylaxis  Did it involve swelling of the face/tongue/throat, SOB, or low BP? No Did it involve sudden or severe rash/hives, skin peeling, or any reaction on the inside of your mouth or nose? Yes Did you need to seek medical attention at a hospital or doctor's office? Yes When did it last happen?Childhood If all above answers are "NO", may proceed with cephalosporin use.   Marland Kitchen Phenergan [Promethazine Hcl]     Tremors, restless lef, akathesia [id number]    Metabolic Disorder Labs: Lab Results  Component Value Date   HGBA1C 5.1 07/04/2019   No results found for: PROLACTIN Lab Results  Component Value Date   CHOL 108 03/13/2018   TRIG 45 03/13/2018   HDL 35 (L) 03/13/2018   CHOLHDL 3.1 03/13/2018   [place] 64  03/13/2018   Lab Results  Component Value Date   TSH 1.170 03/13/2018    Therapeutic Level Labs: No results found for: LITHIUM No results found for: VALPROATE No components found for:  CBMZ  Current Medications: Current Outpatient Medications  Medication Sig Dispense Refill  . escitalopram (LEXAPRO) 20 MG tablet Take 1.5 tablets (30 mg total) by mouth daily. 45 tablet 2  . [START ON 03/21/2020] amphetamine-dextroamphetamine (ADDERALL XR) 25 MG 24 hr capsule Take 1 capsule by mouth every morning. 30 capsule 0  . clonazePAM (KLONOPIN) 1 MG tablet Take 1 tablet (1 mg total) by mouth 3 (three) times daily as needed for anxiety. 90 tablet 2  . HYDROcodone-acetaminophen (NORCO/VICODIN) 5-325 MG tablet Take 1 tablet by mouth every 6 (six)  hours as needed for moderate pain. 30 tablet 0  . lamoTRIgine (LAMICTAL) 150 MG tablet Take 1 tablet (150 mg total) by mouth 2 (two) times daily. 60 tablet 2  . lidocaine (XYLOCAINE) 2 % solution Use as directed 15 mLs in the mouth or throat every 6 (six) hours as needed for mouth pain. 200 mL 1  . [START ON 04/07/2020] zaleplon (SONATA) 10 MG capsule Take 1 capsule (10 mg total) by mouth at bedtime as needed for sleep. 30 capsule 1   No current facility-administered medications for this visit.     Psychiatric Specialty Exam: Review of Systems  Psychiatric/Behavioral: Positive for sleep disturbance. The patient is nervous/anxious.   All other systems reviewed and are negative.   There were no vitals taken for this visit.There is no height or weight on file to calculate BMI.  General Appearance: NA  Eye Contact:  NA  Speech:  Clear and Coherent and Normal Rate  Volume:  Normal  Mood:  Anxious and Irritable  Affect:  NA  Thought Process:  Goal Directed  Orientation:  Full (Time, Place, and Person)  Thought Content: Rumination   Suicidal Thoughts:  No  Homicidal Thoughts:  No  Memory:  Immediate;   Fair Recent;   Good Remote;   Good  Judgement:   Fair  Insight:  Fair  Psychomotor Activity:  NA  Concentration:  Concentration: Fair  Recall:  [place] of Knowledge: Fair  Language: Good  Akathisia:  Negative  Handed:  Right  AIMS (if indicated): not done  Assets:  Communication Skills Desire for Improvement Housing Resilience  ADL's:  Intact  Cognition: WNL  Sleep:  Fair   Screenings: GAD-7   Personnel officer Visit from 08/03/2019 in [facility] Office Visit from 07/04/2019 in [place] Office Visit from 02/20/2018 in Primary Care at [facility]  Total GAD-7 Score 20 20 18     PHQ2-9   [place] Office Visit from 01/28/2020 in [place] Office Visit from 08/03/2019 in [place] Office Visit from 07/04/2019 in [place] Office Visit from 02/20/2018 in [facility] Care at [facility] Visit from 05/06/2017 in [facility]  PHQ-2 Total Score 4 2 4 3 2   PHQ-9 Total Score 18 11 20 16 12        Assessment and Plan: 24yo DWFwithbipolar disorder and ADHD(the latter diagnosedwhen she was 57-48 years old). Unfortunately she does not remember medications she has been tried for either of these conditions just says that "there were many". She seems to recall names of Ritalin and Adderall. Available record also shows that she was tried on SSRIs (citalopram, fluoxetine), aripiprazole, oxcarbazepine, hydroxyzine, clonazepam. She describes her mood swings as being very rapid - happening several times during the day. WestartedLamictal titration and escitalopram 10 mg daily. Initially she had muscle cramps and some sedation but both have resolved.She also complainedof difficulty with falling asleep - trazodone has not been very effective for that (150 mg)andregularAmbien only helpedher fall asleep but she is up after few hours.I have ordered Ambien  CRbut it was not approved by her insurance.She also failedtemazepam, eszopiclone, mirtazapineand quetiapine (developed muscle twitching on it).We have added Adderall XR 20 mgandwhile she foundit helpful for concentrationis was notyet optimal. We increased dose to 30 mg and she reacted with increased anxiety(thesame happened when we tried Concerta 27 mg) but tolerates 25  mg well.She remains anxious, "stressed out", more so since her application for disability was denied. She is asking for another letter in support of her efforts to get disability income. Clonazepam for anxiety is effective but she needs to use it daily. Sleep has improved after addition of Sonata.   Dx: Bipolar 2ultra rapid cycling; GAD; ADHD  Plan:Continueescitalopram 30mg  for anxiety (could not get it filled this month for unclear reason - I re-sended it),Lamictal 150 mg bid, Adderall XR25 mg, Sonata 10 mg prn insomnia andclonazepam1mg  to tid prn anxiety.I will write another letter of support for her disability claims. Next appointment inone month.The plan was discussed with patient who had an opportunity to ask questions and these were all answered. I spend79minutes inphoneconsultation with the patient.    Stephanie Acre, MD 03/13/2020, 8:44 AM

## 2020-03-13 NOTE — Telephone Encounter (Signed)
Patient called she is requesting a letter that states she is unable to work and that she is a fall risk she stated she is trying to get disability. She also stated she fell the other day tying to get out of her car. CB:[phone]

## 2020-03-14 NOTE — Telephone Encounter (Signed)
I called and advised that I have sent her messages to Dr. Louanne Skye and that we are waiting on him to respond, she understands

## 2020-03-18 ENCOUNTER — Encounter: Payer: Self-pay | Admitting: Specialist

## 2020-03-19 ENCOUNTER — Other Ambulatory Visit (HOSPITAL_COMMUNITY): Payer: Self-pay | Admitting: Psychiatry

## 2020-03-19 NOTE — H&P (Signed)
[facility] History and Physical  Reason for Referral: Umbilical hernia  Referring Physician: Gildardo Pounds, NP     Chief Complaint    New Patient (Initial Visit)      Kellie Martinez is a 25 y.o. female.  HPI: Kellie Martinez is a 25 yo with an umbilical hernia since about 2019 she reports when she was pregnant. The area was uncomfortable at that time but she suffered a miscarriage and after her pregnancy ceased the discomfort improved. She says that she has noticed the bulge is getting larger and that it pulls in the area. She has had it stuck out but has never had obstructive symptoms or had to go to the ED.   She slipped and fell on ice 2020 and had neck surgery this past fall. She is still recovering from this and the surgery occurred October 2021 she says.       Past Medical History:  Diagnosis Date  . Anemia   . Anxiety   . Arthritis    lower back  . Asthma   . Bipolar 1 disorder ([place])   . 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  . Chronic kidney disease   . Depression   . GERD (gastroesophageal reflux disease)   . Headache    otc med prn  . History of kidney stones   . HLD (hyperlipidemia)    diet controlled  . Molar pregnancy   . Pneumonia    x 1 yrs ago per patient 11/22/19  . Renal calculus or stone    had stent/ lithotripsy in past  . Sleep apnea    does not use cpap  . Substance abuse ([place])   . Suicide attempt ([place])    x 2         Past Surgical History:  Procedure Laterality Date  . CERVICAL DISC ARTHROPLASTY N/A 11/26/2019   Procedure: CERVICAL DISC ARTHROPLASTY C5-6 WITH PRO [place];  Surgeon: Jessy Oto, MD;  Location: [place];  Service: Orthopedics;  Laterality: N/A;  . CYSTOSCOPY     x2 with stone extraction  . EXTRACORPOREAL SHOCK WAVE LITHOTRIPSY Right 06/20/2013   Procedure: EXTRACORPOREAL SHOCK WAVE LITHOTRIPSY (ESWL);  Surgeon: Edwin Dada, MD;  Location: [facility];  Service: Urology;  Laterality: Right;   . FOOT SURGERY Left 2003  . LITHOTRIPSY    . TUBAL LIGATION  2017  . TUBAL LIGATION           Family History  Problem Relation Age of Onset  . Depression Mother   . Asthma Mother   . Hypertension Mother   . Mood Disorder Mother   . Heart disease Father   . Kidney disease Father   . Alcoholism Father   . Breast cancer Paternal Aunt   . Breast cancer Maternal Grandmother   . Breast cancer Cousin   . Alcoholism Brother   . Drug abuse Brother     Social History        Tobacco Use  . Smoking status: Current Every Day Smoker    Packs/day: 0.25    Types: Cigarettes  . Smokeless tobacco: Never Used  . Tobacco comment: 3 cig/day  Vaping Use  . Vaping Use: Former  . Quit date: 02/01/2019  Substance Use Topics  . Alcohol use: Yes    Comment:  weekends wine/liquor  . Drug use: Not Currently    Types: Marijuana    Comment: Last use - 11/17/19    Medications: I have reviewed the patient's current medications.  Allergies as of 03/11/2020      Reactions   Levofloxacin    Penicillins Hives   No anaphylaxis Did it involve swelling of the face/tongue/throat, SOB, or low BP? No Did it involve sudden or severe rash/hives, skin peeling, or any reaction on the inside of your mouth or nose? Yes Did you need to seek medical attention at a hospital or doctor's office? Yes When did it last happen?Childhood If all above answers are "NO", may proceed with cephalosporin use.   Phenergan [promethazine Hcl]    Tremors, restless lef, akathesia 01-26-2018         Medication List       Accurate as of March 11, 2020 10:18 AM. If you have any questions, ask your nurse or doctor.        STOP taking these medications   methylPREDNISolone 4 MG tablet Commonly known as: Medrol Stopped by: Virl Cagey, MD     TAKE these medications   amphetamine-dextroamphetamine 25 MG 24 hr capsule Commonly known as: Adderall XR Take 1  capsule by mouth every morning. Start taking on: March 21, 2020   clonazePAM 1 MG tablet Commonly known as: KlonoPIN Take 1 tablet (1 mg total) by mouth 3 (three) times daily as needed for anxiety.   escitalopram 20 MG tablet Commonly known as: LEXAPRO Take 1.5 tablets (30 mg total) by mouth daily.   HYDROcodone-acetaminophen 5-325 MG tablet Commonly known as: NORCO/VICODIN Take 1 tablet by mouth every 6 (six) hours as needed for moderate pain.   lamoTRIgine 150 MG tablet Commonly known as: LaMICtal Take 1 tablet (150 mg total) by mouth 2 (two) times daily.   lidocaine 2 % solution Commonly known as: XYLOCAINE Use as directed 15 mLs in the mouth or throat every 6 (six) hours as needed for mouth pain.   zaleplon 10 MG capsule Commonly known as: SONATA Take 1 capsule (10 mg total) by mouth at bedtime as needed for sleep.        ROS:  A comprehensive review of systems was negative except for: Respiratory: positive for cough and wheezing Gastrointestinal: positive for abdominal pain, nausea and reflux symptoms Musculoskeletal: positive for back pain, neck pain and joint pain Neurological: positive for dizziness and headaches  Blood pressure 110/74, pulse (!) 103, temperature (!) 97 F (36.1 C), temperature source Other (Comment), resp. rate 16, height 5\' 2"  (1.575 m), weight 210 lb (95.3 kg), SpO2 98 %. Physical Exam Vitals reviewed.  Constitutional:      Appearance: Normal appearance.  HENT:     Head: Normocephalic.  Eyes:     Extraocular Movements: Extraocular movements intact.  Cardiovascular:     Rate and Rhythm: Normal rate.  Pulmonary:     Effort: Pulmonary effort is normal.  Abdominal:     General: There is no distension.     Palpations: Abdomen is soft.     Tenderness: There is abdominal tenderness in the periumbilical area.     Hernia: A hernia is present. Hernia is present in the umbilical area.     Comments: Reducible fat containing hernia   Musculoskeletal:        General: Normal range of motion.     Cervical back: No rigidity.  Skin:    General: Skin is warm.  Neurological:     General: No focal deficit present.     Mental Status: She is oriented to person, place, and time.  Psychiatric:        Mood and Affect:  Mood normal.        Behavior: Behavior normal.        Thought Content: Thought content normal.        Judgment: Judgment normal.     Results: None  Assessment & Plan:  MANEH SIEBEN is a 25 y.o. female with an umbilical hernia. She is having some discomfort and has had it for several years. She wants to proceed with repair.  -Discussed repair with mesh and risk of bleeding, infection, injury to bowel, use of mesh, and recurrence.  -Discussed preop COVID testing, she tested positive on 03/02/2020 and is vaccinated she says  -Will get her scheduled in the upcoming weeks    All questions were answered to the satisfaction of the patient.    Virl Cagey 03/11/2020, 10:18 AM

## 2020-03-24 ENCOUNTER — Encounter: Payer: Self-pay | Admitting: Nurse Practitioner

## 2020-03-24 NOTE — Patient Instructions (Signed)
Kellie Martinez  03/24/2020     @PREFPERIOPPHARMACY @   Your procedure is scheduled on  03/28/2020.   Report to Kellie Martinez at  [phone]  A.M.   Call this number if you have problems the morning of surgery:  [phone]   Remember:  Do not eat or drink after midnight.                        Take these medicines the morning of surgery with A SIP OF WATER  Adderall, clonazepam, lexapro, lamictal, robaxin (if needed).   Use your inhaler before you come.   Shower the night before and the morning of your procedure with CHG. DO  NOT put CHG on your face, hair or genitals.   After your night shower, dry off with a clean towel, put on clean clothes to sleep in and clean sheets on your bed before you sleep.   DO NOT sleep with pets this night.  After your morning shower, dry off with a clean towel, put on clean, comfortable clothes and brush your teeth before you come to the hospital.      Do not wear jewelry, make-up or nail polish.  Do not wear lotions, powders, or perfumes, or deodorant.  Do not shave 48 hours prior to surgery.  Men may shave face and neck.  Do not bring valuables to the hospital.  [name] is not responsible for any belongings or valuables.  Contacts, dentures or bridgework may not be worn into surgery.  Leave your suitcase in the car.  After surgery it may be brought to your room.  For patients admitted to the hospital, discharge time will be determined by your treatment team.  Patients discharged the day of surgery will not be allowed to drive home and must have someone with them for 24 hours.   Special instructions:  DO NOT smoke tobacco or vape the morning of your procedure.   Please read over the following fact sheets that you were given. Coughing and Deep Breathing, Surgical Site Infection Prevention, Anesthesia Post-op Instructions and Care and Recovery After Surgery       Open Hernia Repair, Adult, Care After What can I expect after the  procedure? After the procedure, it is common to have:  Mild discomfort.  Slight bruising.  Mild swelling.  Pain in the belly (abdomen).  A small amount of blood from the cut from surgery (incision). Follow these instructions at home: Your doctor may give you more specific instructions. If you have problems, call your doctor. Medicines  Take over-the-counter and prescription medicines only as told by your doctor.  If told, take steps to prevent problems with pooping (constipation). You may need to: ? Drink enough fluid to keep your pee (urine) pale yellow. ? Take medicines. You will be told what medicines to take. ? Eat foods that are high in fiber. These include beans, whole grains, and fresh fruits and vegetables. ? Limit foods that are high in fat and sugar. These include fried or sweet foods.  Ask your doctor if you should avoid driving or using machines while you are taking your medicine. Incision care  Follow instructions from your doctor about how to take care of your incision. Make sure you: ? Wash your hands with soap and water for at least 20 seconds before and after you change your bandage (dressing). If you cannot use soap and water, use hand sanitizer. ?  Change your bandage. ? Leave stitches or skin glue in place for at least 2 weeks. ? Leave tape strips alone unless you are told to take them off. You may trim the edges of the tape strips if they curl up.  Check your incision every day for signs of infection. Check for: ? More redness, swelling, or pain. ? More fluid or blood. ? Warmth. ? Pus or a bad smell.  Wear loose, soft clothing while your incision heals.   Activity  Rest as told by your doctor.  Do not lift anything that is heavier than 10 lb (4.5 kg), or the limit that you are told.  Do not play contact sports until your doctor says that this is safe.  If you were given a sedative during your procedure, do not drive or use machines until your doctor  says that it is safe. A sedative is a medicine that helps you relax.  Return to your normal activities when your doctor says that it is safe.   General instructions  Do not take baths, swim, or use a hot tub. Ask your doctor about taking showers or sponge baths.  Hold a pillow over your belly when you cough or sneeze. This helps with pain.  Do not smoke or use any products that contain nicotine or tobacco. If you need help quitting, ask your doctor.  Keep all follow-up visits. Contact a doctor if:  You have any of these signs of infection in or around your incision: ? More redness, swelling, or pain. ? More fluid or blood. ? Warmth. ? Pus. ? A bad smell.  You have a fever or chills.  You have blood in your poop (stool).  You have not pooped (had a bowel movement) in 2-3 days.  Medicine does not help your pain. Get help right away if:  You have chest pain, or you are short of breath.  You feel faint or light-headed.  You have very bad pain.  You vomit and your pain is worse.  You have pain, swelling, or redness in a leg. These symptoms may be an emergency. Get help right away. Call your local emergency services (911 in the [place]  Do not wait to see if the symptoms will go away.  Do not drive yourself to the hospital. Summary  After this procedure, it is common to have mild discomfort, slight bruising, and mild swelling.  Follow instructions from your doctor about how to take care of your cut from surgery (incision). Check every day for signs of infection.  Do not lift heavy objects or play contact sports until your doctor says it is safe.  Return to your normal activities as told by your doctor. This information is not intended to replace advice given to you by your health care provider. Make sure you discuss any questions you have with your health care provider. Document Revised: 09/10/2019 Document Reviewed: 09/10/2019 Elsevier Patient Education  2021  [place] Anesthesia, Adult, Care After This sheet gives you information about how to care for yourself after your procedure. Your health care provider may also give you more specific instructions. If you have problems or questions, contact your health care provider. What can I expect after the procedure? After the procedure, the following side effects are common:  Pain or discomfort at the IV site.  Nausea.  Vomiting.  Sore throat.  Trouble concentrating.  Feeling cold or chills.  Feeling weak or tired.  Sleepiness and fatigue.  Soreness and  body aches. These side effects can affect parts of the body that were not involved in surgery. Follow these instructions at home: For the time period you were told by your health care provider:  Rest.  Do not participate in activities where you could fall or become injured.  Do not drive or use machinery.  Do not drink alcohol.  Do not take sleeping pills or medicines that cause drowsiness.  Do not make important decisions or sign legal documents.  Do not take care of children on your own.   Eating and drinking  Follow any instructions from your health care provider about eating or drinking restrictions.  When you feel hungry, start by eating small amounts of foods that are soft and easy to digest (bland), such as toast. Gradually return to your regular diet.  Drink enough fluid to keep your urine pale yellow.  If you vomit, rehydrate by drinking water, juice, or clear broth. General instructions  If you have sleep apnea, surgery and certain medicines can increase your risk for breathing problems. Follow instructions from your health care provider about wearing your sleep device: ? Anytime you are sleeping, including during daytime naps. ? While taking prescription pain medicines, sleeping medicines, or medicines that make you drowsy.  Have a responsible adult stay with you for the time you are told. It is  important to have someone help care for you until you are awake and alert.  Return to your normal activities as told by your health care provider. Ask your health care provider what activities are safe for you.  Take over-the-counter and prescription medicines only as told by your health care provider.  If you smoke, do not smoke without supervision.  Keep all follow-up visits as told by your health care provider. This is important. Contact a health care provider if:  You have nausea or vomiting that does not get better with medicine.  You cannot eat or drink without vomiting.  You have pain that does not get better with medicine.  You are unable to pass urine.  You develop a skin rash.  You have a fever.  You have redness around your IV site that gets worse. Get help right away if:  You have difficulty breathing.  You have chest pain.  You have blood in your urine or stool, or you vomit blood. Summary  After the procedure, it is common to have a sore throat or nausea. It is also common to feel tired.  Have a responsible adult stay with you for the time you are told. It is important to have someone help care for you until you are awake and alert.  When you feel hungry, start by eating small amounts of foods that are soft and easy to digest (bland), such as toast. Gradually return to your regular diet.  Drink enough fluid to keep your urine pale yellow.  Return to your normal activities as told by your health care provider. Ask your health care provider what activities are safe for you. This information is not intended to replace advice given to you by your health care provider. Make sure you discuss any questions you have with your health care provider. Document Revised: 10/11/2019 Document Reviewed: 05/10/2019 Elsevier Patient Education  2021 [employer].

## 2020-03-26 ENCOUNTER — Other Ambulatory Visit (HOSPITAL_COMMUNITY): Admission: RE | Admit: 2020-03-26 | Payer: Medicaid Other | Source: Ambulatory Visit

## 2020-03-26 ENCOUNTER — Encounter (HOSPITAL_COMMUNITY): Payer: Self-pay

## 2020-03-26 ENCOUNTER — Encounter (HOSPITAL_COMMUNITY)
Admission: RE | Admit: 2020-03-26 | Discharge: 2020-03-26 | Disposition: A | Discharge status: Home / Self Care | Payer: Medicaid Other | Source: Ambulatory Visit | Attending: General Surgery | Admitting: General Surgery

## 2020-03-26 ENCOUNTER — Other Ambulatory Visit: Payer: Self-pay

## 2020-03-26 DIAGNOSIS — Z01818 Encounter for other preprocedural examination: Secondary | ICD-10-CM | POA: Insufficient documentation

## 2020-03-26 LAB — BASIC METABOLIC PANEL
Anion gap: 6 (ref 5–15)
BUN: 9 mg/dL (ref 6–20)
CO2: 25 mmol/L (ref 22–32)
Calcium: 9 mg/dL (ref 8.9–10.3)
Chloride: 103 mmol/L (ref 98–111)
Creatinine, Ser: 0.73 mg/dL (ref 0.44–1.00)
GFR, Estimated: >60 mL/min (ref >60)
Glucose, Bld: 83 mg/dL (ref 70–99)
Potassium: 3.6 mmol/L (ref 3.5–5.1)
Sodium: 134 mmol/L — ABNORMAL LOW (ref 135–145)

## 2020-03-26 LAB — CBC WITH DIFFERENTIAL/PLATELET
Abs Immature Granulocytes: 0.03 10*3/uL (ref 0.00–0.07)
Basophils Absolute: 0 10*3/uL (ref 0.0–0.1)
Basophils Relative: 0 %
Eosinophils Absolute: 0.1 10*3/uL (ref 0.0–0.5)
Eosinophils Relative: 1 %
HCT: 39.2 % (ref 36.0–46.0)
Hemoglobin: 12.6 g/dL (ref 12.0–15.0)
Immature Granulocytes: 0 %
Lymphocytes Relative: 17 %
Lymphs Abs: 1.2 10*3/uL (ref 0.7–4.0)
MCH: 30 pg (ref 26.0–34.0)
MCHC: 32.1 g/dL (ref 30.0–36.0)
MCV: 93.3 fL (ref 80.0–100.0)
Monocytes Absolute: 0.6 10*3/uL (ref 0.1–1.0)
Monocytes Relative: 9 %
Neutro Abs: 5 10*3/uL (ref 1.7–7.7)
Neutrophils Relative %: 73 %
Platelets: 193 10*3/uL (ref 150–400)
RBC: 4.2 MIL/uL (ref 3.87–5.11)
RDW: 13.6 % (ref 11.5–15.5)
WBC: 6.9 10*3/uL (ref 4.0–10.5)
nRBC: 0 % (ref 0.0–0.2)

## 2020-03-26 LAB — HCG, SERUM, QUALITATIVE: Preg, Serum: NEGATIVE

## 2020-03-28 ENCOUNTER — Encounter (HOSPITAL_COMMUNITY): Payer: Self-pay | Admitting: General Surgery

## 2020-03-28 ENCOUNTER — Ambulatory Visit (HOSPITAL_COMMUNITY): Payer: Medicaid Other | Admitting: Anesthesiology

## 2020-03-28 ENCOUNTER — Ambulatory Visit (HOSPITAL_COMMUNITY)
Admission: RE | Admit: 2020-03-28 | Discharge: 2020-03-28 | Disposition: A | Discharge status: Home / Self Care | Payer: Medicaid Other | Attending: General Surgery | Admitting: General Surgery

## 2020-03-28 ENCOUNTER — Encounter (HOSPITAL_COMMUNITY)
Admission: RE | Disposition: A | Discharge status: Home / Self Care | Payer: Self-pay | Source: Home / Self Care | Attending: General Surgery

## 2020-03-28 ENCOUNTER — Other Ambulatory Visit: Payer: Self-pay

## 2020-03-28 DIAGNOSIS — Z888 Allergy status to other drugs, medicaments and biological substances status: Secondary | ICD-10-CM | POA: Diagnosis not present

## 2020-03-28 DIAGNOSIS — F17210 Nicotine dependence, cigarettes, uncomplicated: Secondary | ICD-10-CM | POA: Diagnosis not present

## 2020-03-28 DIAGNOSIS — Z79899 Other long term (current) drug therapy: Secondary | ICD-10-CM | POA: Insufficient documentation

## 2020-03-28 DIAGNOSIS — K429 Umbilical hernia without obstruction or gangrene: Secondary | ICD-10-CM | POA: Insufficient documentation

## 2020-03-28 DIAGNOSIS — Z880 Allergy status to penicillin: Secondary | ICD-10-CM | POA: Insufficient documentation

## 2020-03-28 DIAGNOSIS — Z881 Allergy status to other antibiotic agents status: Secondary | ICD-10-CM | POA: Diagnosis not present

## 2020-03-28 DIAGNOSIS — G4730 Sleep apnea, unspecified: Secondary | ICD-10-CM | POA: Diagnosis not present

## 2020-03-28 HISTORY — PX: UMBILICAL HERNIA REPAIR: SHX196

## 2020-03-28 SURGERY — REPAIR, HERNIA, UMBILICAL, ADULT
Anesthesia: General

## 2020-03-28 MED ORDER — LIDOCAINE HCL (CARDIAC) PF 50 MG/5ML IV SOSY
PREFILLED_SYRINGE | INTRAVENOUS | Status: DC | PRN
  Administered 2020-03-28: 60 mg via INTRAVENOUS

## 2020-03-28 MED ORDER — LACTATED RINGERS IV SOLN: INTRAVENOUS | Status: DC

## 2020-03-28 MED ORDER — CHLORHEXIDINE GLUCONATE CLOTH 2 % EX PADS: 6.0000 | MEDICATED_PAD | Freq: Once | CUTANEOUS | Status: DC

## 2020-03-28 MED ORDER — MIDAZOLAM HCL 2 MG/2ML IJ SOLN
INTRAMUSCULAR | Status: AC
  Filled 2020-03-28: qty 2

## 2020-03-28 MED ORDER — DEXAMETHASONE SODIUM PHOSPHATE 10 MG/ML IJ SOLN
INTRAMUSCULAR | Status: AC
  Filled 2020-03-28: qty 1

## 2020-03-28 MED ORDER — ORAL CARE MOUTH RINSE: 15.0000 mL | Freq: Once | OROMUCOSAL | Status: AC

## 2020-03-28 MED ORDER — FENTANYL CITRATE (PF) 100 MCG/2ML IJ SOLN
INTRAMUSCULAR | Status: AC
  Filled 2020-03-28: qty 2

## 2020-03-28 MED ORDER — CHLORHEXIDINE GLUCONATE 0.12 % MT SOLN
15.0000 mL | Freq: Once | OROMUCOSAL | Status: AC
  Administered 2020-03-28: 15 mL via OROMUCOSAL

## 2020-03-28 MED ORDER — MEPERIDINE HCL 50 MG/ML IJ SOLN: 6.2500 mg | INTRAMUSCULAR | Status: DC | PRN

## 2020-03-28 MED ORDER — FENTANYL CITRATE (PF) 100 MCG/2ML IJ SOLN
INTRAMUSCULAR | Status: DC | PRN
  Administered 2020-03-28: 50 ug via INTRAVENOUS
  Administered 2020-03-28: 100 ug via INTRAVENOUS

## 2020-03-28 MED ORDER — DEXAMETHASONE SODIUM PHOSPHATE 10 MG/ML IJ SOLN
INTRAMUSCULAR | Status: DC | PRN
  Administered 2020-03-28: 10 mg via INTRAVENOUS

## 2020-03-28 MED ORDER — SUCCINYLCHOLINE CHLORIDE 200 MG/10ML IV SOSY
PREFILLED_SYRINGE | INTRAVENOUS | Status: AC
  Filled 2020-03-28: qty 10

## 2020-03-28 MED ORDER — CLINDAMYCIN PHOSPHATE 900 MG/50ML IV SOLN
900.0000 mg | INTRAVENOUS | Status: AC
  Administered 2020-03-28: 900 mg via INTRAVENOUS

## 2020-03-28 MED ORDER — ONDANSETRON HCL 4 MG/2ML IJ SOLN
INTRAMUSCULAR | Status: AC
  Filled 2020-03-28: qty 2

## 2020-03-28 MED ORDER — ROCURONIUM BROMIDE 10 MG/ML (PF) SYRINGE
PREFILLED_SYRINGE | INTRAVENOUS | Status: AC
  Filled 2020-03-28: qty 10

## 2020-03-28 MED ORDER — ONDANSETRON HCL 4 MG/2ML IJ SOLN: 4.0000 mg | Freq: Once | INTRAMUSCULAR | Status: DC | PRN

## 2020-03-28 MED ORDER — OXYCODONE HCL 5 MG PO TABS: 5.0000 mg | ORAL_TABLET | ORAL | 0 refills | Status: DC | PRN

## 2020-03-28 MED ORDER — BUPIVACAINE LIPOSOME 1.3 % IJ SUSP
INTRAMUSCULAR | Status: AC
  Filled 2020-03-28: qty 20

## 2020-03-28 MED ORDER — SODIUM CHLORIDE 0.9 % IR SOLN
Status: DC | PRN
  Administered 2020-03-28: 1000 mL

## 2020-03-28 MED ORDER — LIDOCAINE HCL (PF) 2 % IJ SOLN
INTRAMUSCULAR | Status: AC
  Filled 2020-03-28: qty 5

## 2020-03-28 MED ORDER — HYDROMORPHONE HCL 1 MG/ML IJ SOLN
0.2500 mg | INTRAMUSCULAR | Status: DC | PRN
  Administered 2020-03-28 (×2): 0.5 mg via INTRAVENOUS
  Filled 2020-03-28 (×2): qty 0.5

## 2020-03-28 MED ORDER — ONDANSETRON HCL 4 MG/2ML IJ SOLN
INTRAMUSCULAR | Status: DC | PRN
  Administered 2020-03-28: 4 mg via INTRAVENOUS

## 2020-03-28 MED ORDER — CLINDAMYCIN PHOSPHATE 900 MG/50ML IV SOLN
INTRAVENOUS | Status: AC
  Filled 2020-03-28: qty 50

## 2020-03-28 MED ORDER — CHLORHEXIDINE GLUCONATE 0.12 % MT SOLN
OROMUCOSAL | Status: AC
  Filled 2020-03-28: qty 15

## 2020-03-28 MED ORDER — ROCURONIUM BROMIDE 100 MG/10ML IV SOLN
INTRAVENOUS | Status: DC | PRN
  Administered 2020-03-28: 30 mg via INTRAVENOUS

## 2020-03-28 MED ORDER — MIDAZOLAM HCL 2 MG/2ML IJ SOLN
INTRAMUSCULAR | Status: DC | PRN
  Administered 2020-03-28: 2 mg via INTRAVENOUS

## 2020-03-28 MED ORDER — SUCCINYLCHOLINE CHLORIDE 20 MG/ML IJ SOLN
INTRAMUSCULAR | Status: DC | PRN
  Administered 2020-03-28: 120 mg via INTRAVENOUS

## 2020-03-28 MED ORDER — ONDANSETRON HCL 4 MG PO TABS: 4.0000 mg | ORAL_TABLET | Freq: Three times a day (TID) | ORAL | 1 refills | Status: DC | PRN

## 2020-03-28 MED ORDER — PROPOFOL 10 MG/ML IV BOLUS
INTRAVENOUS | Status: DC | PRN
  Administered 2020-03-28: 50 mg via INTRAVENOUS
  Administered 2020-03-28: 200 mg via INTRAVENOUS

## 2020-03-28 MED ORDER — SUGAMMADEX SODIUM 500 MG/5ML IV SOLN
INTRAVENOUS | Status: DC | PRN
  Administered 2020-03-28: 200 mg via INTRAVENOUS

## 2020-03-28 SURGICAL SUPPLY — 31 items
ADH SKN CLS APL DERMABOND .7 (GAUZE/BANDAGES/DRESSINGS) ×1
APL PRP STRL LF DISP 70% ISPRP (MISCELLANEOUS) ×1
BLADE SURG 15 STRL LF DISP TIS (BLADE) ×1 IMPLANT
BLADE SURG 15 STRL SS (BLADE) ×2
CHLORAPREP W/TINT 26 (MISCELLANEOUS) ×2 IMPLANT
CLOTH BEACON ORANGE TIMEOUT ST (SAFETY) ×2 IMPLANT
COVER LIGHT HANDLE STERIS (MISCELLANEOUS) ×4 IMPLANT
COVER WAND RF STERILE (DRAPES) ×2 IMPLANT
DERMABOND ADVANCED (GAUZE/BANDAGES/DRESSINGS) ×1
DERMABOND ADVANCED .7 DNX12 (GAUZE/BANDAGES/DRESSINGS) ×1 IMPLANT
ELECT REM PT RETURN 9FT ADLT (ELECTROSURGICAL) ×2
ELECTRODE REM PT RTRN 9FT ADLT (ELECTROSURGICAL) ×1 IMPLANT
GLOVE SURG ENC MOIS LTX SZ6.5 (GLOVE) ×4 IMPLANT
GLOVE SURG UNDER POLY LF SZ6.5 (GLOVE) ×4 IMPLANT
GLOVE SURG UNDER POLY LF SZ7 (GLOVE) ×2 IMPLANT
GOWN STRL REUS W/TWL LRG LVL3 (GOWN DISPOSABLE) ×4 IMPLANT
INST SET MINOR GENERAL (KITS) ×2 IMPLANT
KIT TURNOVER KIT A (KITS) ×2 IMPLANT
MANIFOLD NEPTUNE II (INSTRUMENTS) ×2 IMPLANT
NEEDLE HYPO 18GX1.5 BLUNT FILL (NEEDLE) ×2 IMPLANT
NEEDLE HYPO 21X1.5 SAFETY (NEEDLE) ×2 IMPLANT
NS IRRIG 1000ML POUR BTL (IV SOLUTION) ×2 IMPLANT
PACK MINOR (CUSTOM PROCEDURE TRAY) ×2 IMPLANT
PAD ARMBOARD 7.5X6 YLW CONV (MISCELLANEOUS) ×2 IMPLANT
PENCIL SMOKE EVACUATOR (MISCELLANEOUS) ×2 IMPLANT
SET BASIN LINEN APH (SET/KITS/TRAYS/PACK) ×2 IMPLANT
SUT ETHIBOND NAB MO 7 #0 18IN (SUTURE) ×2 IMPLANT
SUT MNCRL AB 4-0 PS2 18 (SUTURE) ×2 IMPLANT
SUT VIC AB 3-0 SH 27 (SUTURE) ×2
SUT VIC AB 3-0 SH 27X BRD (SUTURE) ×1 IMPLANT
SYR 20ML LL LF (SYRINGE) ×4 IMPLANT

## 2020-03-28 NOTE — Transfer of Care (Signed)
Immediate Anesthesia Transfer of Care Note  Patient: Kellie Martinez  Procedure(s) Performed: HERNIA REPAIR UMBILICAL ADULT (N/A )  Patient Location: PACU  Anesthesia Type:General  Level of Consciousness: awake and patient cooperative  Airway & Oxygen Therapy: Patient Spontanous Breathing and Patient connected to nasal cannula oxygen  Post-op Assessment: Report given to RN and Post -op Vital signs reviewed and stable  Post vital signs: Reviewed and stable  Last Vitals:  Vitals Value Taken Time  BP    Temp    Pulse 87 03/28/20 1257  Resp 17 03/28/20 1257  SpO2 100 % 03/28/20 1257  Vitals shown include unvalidated device data. See vital sign flow sheet Last Pain:  Vitals:   03/28/20 1122  TempSrc: Oral  PainSc: 8       Patients Stated Pain Goal: 3 (01/49/96 9249)  Complications: No complications documented.

## 2020-03-28 NOTE — Interval H&P Note (Signed)
History and Physical Interval Note:  03/28/2020 11:16 AM  Kellie Martinez  has presented today for surgery, with the diagnosis of Umbilical hernia.  The various methods of treatment have been discussed with the patient and family. After consideration of risks, benefits and other options for treatment, the patient has consented to  Procedure(s): HERNIA REPAIR UMBILICAL ADULT (N/A) as a surgical intervention.  The patient's history has been reviewed, patient examined, no change in status, stable for surgery.  I have reviewed the patient's chart and labs.  Questions were answered to the patient's satisfaction.    No changes. Virl Cagey

## 2020-03-28 NOTE — Progress Notes (Signed)
[facility]  Rx sent to pharmacy. Tried to call family but no answer. Voicemail left. No family in waiting room.   Will see in 4 weeks. Abd binder. No heavy lifting > 10 lbs, excessive bending, pushing, pulling, or squatting for 6-8 weeks after surgery.   Curlene Labrum, MD [facility] [address] [place], [place] [address] [phone] (office)

## 2020-03-28 NOTE — Op Note (Signed)
[facility] Operative Note  03/28/20  Preoperative Diagnosis: Umbilical hernia   Postoperative Diagnosis: Umbilical hernia with preperitoneal fat    Procedure(s) Performed: Primary umbilical hernia repair    Surgeon: Lanell Matar. Constance Haw, MD   Assistants: No qualified resident was available    Anesthesia: General endotracheal   Anesthesiologist: Denese Killings, MD    Specimens: None    Estimated Blood Loss: Minimal   Blood Replacement: None    Complications: None     Wound Class:Clean    Operative Indications: Ms. Kapusta is a 25 yo who has noticed an umbilical hernia that is causing discomfort and bulging. We discussed repair and risk of infection, bleeding, recurrence, possibly using a mesh.   Findings: Preperitoneal fat found in < 0.5cm defect of fascia    Procedure: The patient was taken to the operating room and placed supine. General endotracheal anesthesia was induced. Intravenous antibiotics were  administered per protocol.  The abdomen was prepared and draped in the usual sterile fashion.   The umbilical hernia was noted to be only partially reducible.  An incision was made under the umbilicus in her prior scar, and carried down through the subcutaneous tissue with electrocautery.  Dissection was performed down to the level of the fascia, exposing preperitoneal fat. The fat was resected with electrocautery.  The fascial defect was only 0.5cm max, and I did want to open it further given that this appeared all preperitoneal.  The hernia defect was then closed with 0 Ethibond suture in an interrupted fashion over the patch.  The umbilicus was tacked to the fascia with a 3-0 Vicryl suture.   Hemostasis was confirmed. The skin was closed with a running 4-0 Monocryl suture and dermabond.  After the dermabond dried a 2X2 and tegaderm were placed over the umbilicus to act as a pressure dressing.    All counts were correct at the end of the case. The patient  was awakened from anesthesia and extubated without complication.  The patient went to the PACU in stable condition.  Curlene Labrum, MD [facility] [address] [place], [place] [address] [phone] (office)

## 2020-03-28 NOTE — Anesthesia Preprocedure Evaluation (Signed)
Anesthesia Evaluation  Patient identified by MRN, date of birth, ID band Patient awake    Reviewed: Allergy & Precautions, NPO status , Patient's Chart, lab work & pertinent test results  History of Anesthesia Complications Negative for: history of anesthetic complications  Airway Mallampati: II  TM Distance: >3 FB Neck ROM: Full   Comment: Cervical disc arthroplasty Dental  (+) Dental Advisory Given, Missing, Chipped   Pulmonary asthma , sleep apnea , pneumonia, Current Smoker and Patient abstained from smoking.,    Pulmonary exam normal breath sounds clear to auscultation       Cardiovascular Exercise Tolerance: Good Normal cardiovascular exam Rhythm:Regular Rate:Normal  Left ventricle: The cavity size was normal. Wall thickness was  normal. Systolic function was normal. The estimated ejection  fraction was in the range of 50% to 55%. Wall motion was normal;  there were no regional wall motion abnormalities. Left  ventricular diastolic function parameters were normal.  - Aortic valve: Valve area (VTI): 2.14 cm^2. Valve area (Vmax):  1.92 cm^2. Valve area (Vmean): 2.15 cm^2.  - Technically adequate study.    Neuro/Psych  Headaches, PSYCHIATRIC DISORDERS Anxiety Depression Bipolar Disorder  Neuromuscular disease    GI/Hepatic GERD  Medicated and Controlled,(+)     substance abuse  alcohol use,   Endo/Other  negative endocrine ROS  Renal/GU Renal disease     Musculoskeletal  (+) Arthritis  (back pain, neck pain , cervical radiculopathy),   Abdominal   Peds  Hematology  (+) anemia ,   Anesthesia Other Findings   Reproductive/Obstetrics                            Anesthesia Physical Anesthesia Plan  ASA: III  Anesthesia Plan: General   Post-op Pain Management:    Induction: Intravenous  PONV Risk Score and Plan: Ondansetron, Dexamethasone and Midazolam  Airway  Management Planned: Oral ETT  Additional Equipment:   Intra-op Plan:   Post-operative Plan: Extubation in OR  Informed Consent: I have reviewed the patients History and Physical, chart, labs and discussed the procedure including the risks, benefits and alternatives for the proposed anesthesia with the patient or authorized representative who has indicated his/her understanding and acceptance.     Dental advisory given  Plan Discussed with:   Anesthesia Plan Comments:        Anesthesia Quick Evaluation

## 2020-03-28 NOTE — Anesthesia Procedure Notes (Signed)
Procedure Name: Intubation Date/Time: 03/28/2020 12:01 PM Performed by: Vista Deck, CRNA Pre-anesthesia Checklist: Patient identified, Patient being monitored, Timeout performed, Emergency Drugs available and Suction available Patient Re-evaluated:Patient Re-evaluated prior to induction Oxygen Delivery Method: Circle System Utilized Preoxygenation: Pre-oxygenation with 100% oxygen Induction Type: IV induction Laryngoscope Size: Mac and 3 Grade View: Grade II Tube type: Oral Tube size: 7.0 mm Number of attempts: 1 Airway Equipment and Method: stylet Placement Confirmation: ETT inserted through vocal cords under direct vision,  positive ETCO2 and breath sounds checked- equal and bilateral Secured at: 21 cm Tube secured with: Tape Dental Injury: Teeth and Oropharynx as per pre-operative assessment

## 2020-03-28 NOTE — Anesthesia Postprocedure Evaluation (Signed)
Anesthesia Post Note  Patient: Kellie Martinez  Procedure(s) Performed: HERNIA REPAIR UMBILICAL ADULT (N/A )  Patient location during evaluation: PACU Anesthesia Type: General Level of consciousness: awake and alert and oriented Pain management: pain level controlled Vital Signs Assessment: post-procedure vital signs reviewed and stable Respiratory status: spontaneous breathing Cardiovascular status: blood pressure returned to baseline Postop Assessment: no apparent nausea or vomiting Anesthetic complications: no   No complications documented.   Last Vitals:  Vitals:   03/28/20 1330 03/28/20 1342  BP: 106/73 110/72  Pulse: 69 76  Resp: (!) 24 14  Temp:  36.6 C  SpO2: 100% 97%    Last Pain:  Vitals:   03/28/20 1342  TempSrc: Oral  PainSc: 7                  Willmer Fellers C Shala Baumbach

## 2020-03-28 NOTE — Discharge Instructions (Signed)
Discharge Instructions Hernia:  Common Complaints: Pain at the incision site is common. This will improve with time. Take your pain medications as described below. Some nausea is common and poor appetite. The main goal is to stay hydrated the first few days after surgery.   Diet/ Activity: Diet as tolerated. You may not have an appetite, but it is important to stay hydrated. Drink 64 ounces of water a day. Your appetite will return with time.  Remove the small clear dressing and gauze after two days (48 hours). Trim the gauze off the glue that is underneath if the gauze is stuck to the glue. Shower per your regular routine daily.  Do not take hot showers. Take warm showers that are less than 10 minutes. Rest and listen to your body, but do not remain in bed all day.Walk everyday for at least 15-20 minutes.  Deep cough and move around every 1-2 hours in the first few days after surgery. Do not pick at the dermabond glue on your incision sites.  This glue film will remain in place for 1-2 weeks and will start to peel off. Do not place lotions or balms on your incision unless instructed to specifically by Dr. Constance Haw. Do not lift > 10 lbs, perform excessive bending, pushing, pulling, squatting for 6-8 weeks after surgery. Where your abdominal binder with activity as much as possible. The activity restrictions and the abdominal binder are to prevent hernia formation at your incision while you are healing.   Pain Expectations and Narcotics: -After surgery you will have pain associated with your incisions and this is normal. The pain is muscular and nerve pain, and will get better with time. -You are encouraged and expected to take non narcotic medications like tylenol and ibuprofen (when able) to treat pain as multiple modalities can aid with pain treatment. -Narcotics are only used when pain is severe or there is breakthrough pain. -You are not expected to have a pain score of 0 after surgery, as  we cannot prevent pain. A pain score of 3-4 that allows you to be functional, move, walk, and tolerate some activity is the goal. The pain will continue to improve over the days after surgery and is dependent on your surgery. -Due to [place] law, we are only able to give a certain amount of pain medication to treat post operative pain, and we only give additional narcotics on a patient by patient basis.  -For most laparoscopic surgery, studies have shown that the majority of patients only need 10-15 narcotic pills, and for open surgeries most patients only need 15-20.   -Having appropriate expectations of pain and knowledge of pain management with non narcotics is important as we do not want anyone to become addicted to narcotic pain medication.  -Using ice packs in the first 48 hours and heating pads after 48 hours, wearing an abdominal binder (when recommended), and using over the counter medications are all ways to help with pain management.   -Simple acts like meditation and mindfulness practices after surgery can also help with pain control and research has proven the benefit of these practices.  Medication: Take tylenol and ibuprofen as needed for pain control, alternating every 4-6 hours.  Example:  Tylenol '1000mg'$  @ 6am, 12noon, 6pm, 70mdnight (Do not exceed '4000mg'$  of tylenol a day). Ibuprofen '800mg'$  @ 9am, 3pm, 9pm, 3am (Do not exceed '3600mg'$  of ibuprofen a day).  Take Roxicodone for breakthrough pain every 4 hours.  Take Colace for constipation related to narcotic  pain medication. If you do not have a bowel movement in 2 days, take Miralax over the counter.  Drink plenty of water to also prevent constipation.   Contact Information: If you have questions or concerns, please call our office, [phone], Monday- Thursday 8AM-5PM and Friday 8AM-12Noon.  If it is after hours or on the weekend, please call [facility], [phone], [phone], and ask to speak to the surgeon on call for Dr.  Constance Haw at [facility].     Open Hernia Repair, Adult, Care After What can I expect after the procedure? After the procedure, it is common to have:  Mild discomfort.  Slight bruising.  Mild swelling.  Pain in the belly (abdomen).  A small amount of blood from the cut from surgery (incision). Follow these instructions at home: Your doctor may give you more specific instructions. If you have problems, call your doctor. Medicines  Take over-the-counter and prescription medicines only as told by your doctor.  If told, take steps to prevent problems with pooping (constipation). You may need to: ? Drink enough fluid to keep your pee (urine) pale yellow. ? Take medicines. You will be told what medicines to take. ? Eat foods that are high in fiber. These include beans, whole grains, and fresh fruits and vegetables. ? Limit foods that are high in fat and sugar. These include fried or sweet foods.  Ask your doctor if you should avoid driving or using machines while you are taking your medicine. Incision care  Follow instructions from your doctor about how to take care of your incision. Make sure you: ? Wash your hands with soap and water for at least 20 seconds before and after you change your bandage (dressing). If you cannot use soap and water, use hand sanitizer. ? Change your bandage. ? Leave stitches or skin glue in place for at least 2 weeks. ? Leave tape strips alone unless you are told to take them off. You may trim the edges of the tape strips if they curl up.  Check your incision every day for signs of infection. Check for: ? More redness, swelling, or pain. ? More fluid or blood. ? Warmth. ? Pus or a bad smell.  Wear loose, soft clothing while your incision heals.   Activity  Rest as told by your doctor.  Do not lift anything that is heavier than 10 lb (4.5 kg), or the limit that you are told.  Do not play contact sports until your doctor says that this is  safe.  If you were given a sedative during your procedure, do not drive or use machines until your doctor says that it is safe. A sedative is a medicine that helps you relax.  Return to your normal activities when your doctor says that it is safe.   General instructions  Do not take baths, swim, or use a hot tub. You may shower.   Hold a pillow over your belly when you cough or sneeze. This helps with pain.  Do not smoke or use any products that contain nicotine or tobacco. If you need help quitting, ask your doctor.  Keep all follow-up visits. Contact a doctor if:  You have any of these signs of infection in or around your incision: ? More redness, swelling, or pain. ? More fluid or blood. ? Warmth. ? Pus. ? A bad smell.  You have a fever or chills.  You have blood in your poop (stool).  You have not pooped (had a  bowel movement) in 2-3 days.  Medicine does not help your pain. Get help right away if:  You have chest pain, or you are short of breath.  You feel faint or light-headed.  You have very bad pain.  You vomit and your pain is worse.  You have pain, swelling, or redness in a leg. These symptoms may be an emergency. Get help right away. Call your local emergency services (911 in the [place]  Do not wait to see if the symptoms will go away.  Do not drive yourself to the hospital. Summary  After this procedure, it is common to have mild discomfort, slight bruising, and mild swelling.  Follow instructions from your doctor about how to take care of your cut from surgery (incision). Check every day for signs of infection.  Do not lift heavy objects or play contact sports until your doctor says it is safe.  Return to your normal activities as told by your doctor. This information is not intended to replace advice given to you by your health care provider. Make sure you discuss any questions you have with your health care provider. Document Revised: 09/10/2019  Document Reviewed: 09/10/2019 Elsevier Patient Education  2021 [employer].  Ondansetron tablets What is this medicine? ONDANSETRON (on DAN se tron) is used to treat nausea and vomiting caused by chemotherapy. It is also used to prevent or treat nausea and vomiting after surgery. This medicine may be used for other purposes; ask your health care provider or pharmacist if you have questions. COMMON BRAND NAME(S): Zofran What should I tell my health care provider before I take this medicine? They need to know if you have any of these conditions:  heart disease  history of irregular heartbeat  liver disease  low levels of magnesium or potassium in the blood  an unusual or allergic reaction to ondansetron, granisetron, other medicines, foods, dyes, or preservatives  pregnant or trying to get pregnant  breast-feeding How should I use this medicine? Take this medicine by mouth with a glass of water. Follow the directions on your prescription label. Take your doses at regular intervals. Do not take your medicine more often than directed. Talk to your pediatrician regarding the use of this medicine in children. Special care may be needed. Overdosage: If you think you have taken too much of this medicine contact a poison control center or emergency room at once. NOTE: This medicine is only for you. Do not share this medicine with others. What if I miss a dose? If you miss a dose, take it as soon as you can. If it is almost time for your next dose, take only that dose. Do not take double or extra doses. What may interact with this medicine? Do not take this medicine with any of the following medications:  apomorphine  certain medicines for fungal infections like fluconazole, itraconazole, ketoconazole, posaconazole, voriconazole  cisapride  dronedarone  pimozide  thioridazine This medicine may also interact with the following medications:  carbamazepine  certain medicines for  depression, anxiety, or psychotic disturbances  fentanyl  linezolid  MAOIs like Carbex, Eldepryl, Marplan, Nardil, and Parnate  methylene blue (injected into a vein)  other medicines that prolong the QT interval (cause an abnormal heart rhythm) like dofetilide, ziprasidone  phenytoin  rifampicin  tramadol This list may not describe all possible interactions. Give your health care provider a list of all the medicines, herbs, non-prescription drugs, or dietary supplements you use. Also tell them if you smoke,  drink alcohol, or use illegal drugs. Some items may interact with your medicine. What should I watch for while using this medicine? Check with your doctor or health care professional right away if you have any sign of an allergic reaction. What side effects may I notice from receiving this medicine? Side effects that you should report to your doctor or health care professional as soon as possible:  allergic reactions like skin rash, itching or hives, swelling of the face, lips or tongue  breathing problems  confusion  dizziness  fast or irregular heartbeat  feeling faint or lightheaded, falls  fever and chills  loss of balance or coordination  seizures  sweating  swelling of the hands or feet  tightness in the chest  tremors  unusually weak or tired Side effects that usually do not require medical attention (report to your doctor or health care professional if they continue or are bothersome):  constipation or diarrhea  headache This list may not describe all possible side effects. Call your doctor for medical advice about side effects. You may report side effects to FDA at 1-800-FDA-1088. Where should I keep my medicine? Keep out of the reach of children. Store between 2 and 30 degrees C (36 and 86 degrees F). Throw away any unused medicine after the expiration date. NOTE: This sheet is a summary. It may not cover all possible information. If you have  questions about this medicine, talk to your doctor, pharmacist, or health care provider.  2021 Elsevier/Gold Standard (2018-01-17 07:16:43)  Oxycodone Capsules or Tablets What is this medicine? OXYCODONE (ox i KOE done) is a pain reliever, also called an opioid. It treats severe pain. This medicine may be used for other purposes; ask your health care provider or pharmacist if you have questions. COMMON BRAND NAME(S): Dazidox, Endocodone, Oxaydo, OXECTA, OxyIR, Percolone, Roxicodone, Roxybond What should I tell my health care provider before I take this medicine? They need to know if you have any of these conditions:  brain tumor  drug abuse or addiction  head injury  heart disease  if you often drink alcohol  kidney disease  liver disease  low adrenal gland function  lung disease, asthma, or breathing problem  seizures  stomach or intestine problems  taken an MAOI such as Marplan, Nardil, or Parnate in the last 14 days  an unusual or allergic reaction to oxycodone, other drugs, foods, dyes, or preservatives  pregnant or trying to get pregnant  breast-feeding How should I use this medicine? Take this medicine by mouth with water. Take it as directed on the prescription label at the same time every day. You can take it with or without food. If it upsets your stomach, take it with food. Keep taking it unless your health care provider tells you to stop. Some brands of this medicine, like Oxaydo, have special instructions. Ask your doctor or pharmacist if these directions are for you: Do not cut, crush or chew this medicine. Do not wet, soak, or lick the tablet before you take it. A special MedGuide will be given to you by the pharmacist with each prescription and refill. Be sure to read this information carefully each time. Talk to your pediatrician regarding the use of this medicine in children. Special care may be needed. Overdosage: If you think you have taken too much of  this medicine contact a poison control center or emergency room at once. NOTE: This medicine is only for you. Do not share this medicine with others.  What if I miss a dose? If you miss a dose, take it as soon as you can. If it is almost time for your next dose, take only that dose. Do not take double or extra doses. What may interact with this medicine? Do not take this medicine with any of the following medications:  safinamide This medicine may interact with the following medications:  alcohol  antihistamines for allergy, cough, and cold  atropine  certain antivirals for HIV or hepatitis  certain antibiotics like clarithromycin, erythromycin, linezolid, rifampin  certain medicines for anxiety or sleep  certain medicines for bladder problems like oxybutynin, tolterodine  certain medicines for depression like amitriptyline, fluoxetine, sertraline  certain medicines for fungal infections like ketoconazole, itraconazole, posaconazole  certain medicines for migraine headache like almotriptan, eletriptan, frovatriptan, naratriptan, rizatriptan, sumatriptan, zolmitriptan  certain medicines for nausea or vomiting like dolasetron, granisetron, ondansetron, palonosetron  certain medicines for Parkinson's disease like benztropine, trihexyphenidyl  certain medicines for seizures like carbamazepine, phenobarbital, phenytoin, primidone  certain medicines for stomach problems like dicyclomine, hyoscyamine  certain medicines for travel sickness like scopolamine  diuretics  general anesthetics like halothane, isoflurane, methoxyflurane, propofol  ipratropium  MAOIs like Marplan, Nardil, and Parnate  medicines that relax muscles  methylene blue  other narcotic medicines for pain or cough  phenothiazines like chlorpromazine, mesoridazine, prochlorperazine, thioridazine This list may not describe all possible interactions. Give your health care provider a list of all the  medicines, herbs, non-prescription drugs, or dietary supplements you use. Also tell them if you smoke, drink alcohol, or use illegal drugs. Some items may interact with your medicine. What should I watch for while using this medicine? Tell your health care provider if your pain does not go away, if it gets worse, or if you have new or a different type of pain. You may develop tolerance to this medicine. Tolerance means that you will need a higher dose of the medicine for pain relief. Tolerance is normal and is expected if you take this medicine for a long time. Do not suddenly stop taking your medicine because you may develop a severe reaction. Your body becomes used to the medicine. This does NOT mean you are addicted. Addiction is a behavior related to getting and using a medicine for a nonmedical reason. If you have pain, you have a medical reason to take pain medicine. Your health care provider will tell you how much medicine to take. If your health care provider wants you to stop the medicine, the dose will be slowly lowered over time to avoid any side effects. If you take other medicines that also cause drowsiness such as other narcotic pain medicines, benzodiazepines, or other medicines for sleep, you may have more side effects. Give your health care provider a list of all medicines you use. He or she will tell you how much medicine to take. Do not take more medicine than directed. Get emergency help right away if you have trouble breathing or are unusually tired or sleepy. Talk to your health care provider about naloxone and how to get it. Naloxone is an emergency medicine used for an opioid overdose. An overdose can happen if you take too much opioid. It can also happen if an opioid is taken with some other medicines or substances, such as alcohol. Know the symptoms of an overdose, such as trouble breathing, unusually tired or sleepy, or not being able to respond or wake up. Make sure to tell  caregivers and close contacts where it  is stored. Make sure they know how to use it. After naloxone is given, you must get emergency help right away. Naloxone is a temporary treatment. Repeat doses may be needed. You may get drowsy or dizzy. Do not drive, use machinery, or do anything that needs mental alertness until you know how this medicine affects you. Do not stand up or sit up quickly, especially if you are an older patient. This reduces the risk of dizzy or fainting spells. Alcohol may interfere with the effect of this medicine. Avoid alcoholic drinks. This medicine will cause constipation. If you do not have a bowel movement for 3 days, call your health care provider. Your mouth may get dry. Chewing sugarless gum or sucking hard candy and drinking plenty of water may help. Contact your health care provider if the problem does not go away or is severe. What side effects may I notice from receiving this medicine? Side effects that you should report to your doctor or health care professional as soon as possible:  allergic reactions (skin rash, itching or hives; swelling of the face, lips, or tongue)  confusion  kidney injury (trouble passing urine or change in the amount of urine)  low adrenal gland function (nausea; vomiting; loss of appetite; unusually weak or tired; dizziness; low blood pressure)  low blood pressure (dizziness; feeling faint or lightheaded, falls; unusually weak or tired)  serotonin syndrome (irritable; confusion; diarrhea; fast or irregular heartbeat; muscle twitching; stiff muscles; trouble walking; sweating; high fever; seizures; chills; vomiting)  trouble breathing Side effects that usually do not require medical attention (report to your doctor or health care professional if they continue or are bothersome):  constipation  dry mouth  nausea, vomiting  tiredness This list may not describe all possible side effects. Call your doctor for medical advice about  side effects. You may report side effects to FDA at 1-800-FDA-1088. Where should I keep my medicine? Keep out of the reach of children and pets. This medicine can be abused. Keep it in a safe place to protect it from theft. Do not share it with anyone. It is only for you. Selling or giving away this medicine is dangerous and against the law. Store at [employer] C (77 degrees F). Protect from light and moisture. Keep the container tightly closed. Get rid of any unused medicine after the expiration date. This medicine may cause harm and death if it is taken by other adults, children, or pets. It is important to get rid of the medicine as soon as you no longer need it or it is expired. You can do this in two ways:  Take the medicine to a medicine take-back program. Check with your pharmacy or law enforcement to find a location.  If you cannot return the medicine, flush it down the toilet. NOTE: This sheet is a summary. It may not cover all possible information. If you have questions about this medicine, talk to your doctor, pharmacist, or health care provider.  2021 Elsevier/Gold Standard (2019-12-17 13:26:06)

## 2020-03-29 MED ORDER — BUPIVACAINE LIPOSOME 1.3 % IJ SUSP
INTRAMUSCULAR | Status: AC | PRN
  Administered 2020-03-28: 20 mL

## 2020-03-31 ENCOUNTER — Encounter (HOSPITAL_COMMUNITY): Payer: Self-pay | Admitting: General Surgery

## 2020-03-31 NOTE — Progress Notes (Signed)
Talked with pt. Pt states small end of her incision is open. No active drainage. Stated that she notified Dr Constance Haw office today and has and appt tomorrow (04/01/20) at 1100. Stated area was red. Stated that she did not have incision covered. Instructed to cover with gauze and apply tape. Pt stated that she had no tape. Instructed her to use a band-aid and gauze. Voiced understanding. Denied pain at present time, then stated that the pain med was not effective. Instructed her to inform Dr Constance Haw when she has her appt tomorrow. Voiced understanding. Instructed if develop any further complications to come to ER or contact Dr Constance Haw. Voiced understanding.

## 2020-04-01 ENCOUNTER — Ambulatory Visit (INDEPENDENT_AMBULATORY_CARE_PROVIDER_SITE_OTHER): Payer: Medicaid Other | Admitting: General Surgery

## 2020-04-01 ENCOUNTER — Other Ambulatory Visit: Payer: Self-pay

## 2020-04-01 ENCOUNTER — Encounter: Payer: Self-pay | Admitting: General Surgery

## 2020-04-01 VITALS — BP 112/75 | HR 82 | Temp 97.3°F | Resp 18 | Ht 62.0 in | Wt 208.0 lb

## 2020-04-01 DIAGNOSIS — K429 Umbilical hernia without obstruction or gangrene: Secondary | ICD-10-CM

## 2020-04-01 NOTE — Progress Notes (Signed)
[facility] Note   HPI:  25 y.o. Female presents to clinic for post-op follow-up evaluation of her umbilical hernia. Her dermabond pulled out and she was worried about her incision.   Review of Systems:  No fevers Some minor redness  All other review of systems: otherwise negative   Vital Signs:  BP 112/75   Pulse 82   Temp (!) 97.3 F (36.3 C) (Other (Comment))   Resp 18   Ht 5\' 2"  (1.575 m)   Wt 208 lb (94.3 kg)   LMP 03/17/2020   SpO2 97%   BMI 38.04 kg/m    Physical Exam:  Physical Exam Vitals reviewed.  Cardiovascular:     Rate and Rhythm: Normal rate.  Pulmonary:     Effort: Pulmonary effort is normal.  Abdominal:     General: There is no distension.     Palpations: Abdomen is soft.     Tenderness: There is no abdominal tenderness.     Hernia: No hernia is present.     Comments: Healing infraumbilical incision, edge with minor redness but not extending, more reactive, no drainage, steri strip placed, supraumbilical skin looks stretched/ bruised   Musculoskeletal:        General: No swelling. Normal range of motion.  Neurological:     Mental Status: She is alert.      Assessment:  25 y.o. yo Female s/p umbilical hernia repair. She is having some incision pain and was worried about the area as the glue peeled off.  Plan:  - Steri strip will come off in 5-7 days - Monitor for spreading erythema or drainage - Tylenol and ibuprofen alternating for pain, can do Roxicodone 5-10 mg q 4 PRN for break through. She has pills remaining     Future Appointments  Date Time Provider [place]  04/16/2020  9:00 AM Pucilowski, Marchia Bond, MD [facility] None  04/24/2020  1:45 PM Virl Cagey, MD [facility] None     Curlene Labrum, MD [facility] [address] [place], [place] [address] [phone] (office)

## 2020-04-02 ENCOUNTER — Telehealth: Payer: Self-pay | Admitting: Orthopaedic Surgery

## 2020-04-02 NOTE — Telephone Encounter (Signed)
Patient called stating she's trying to get disability & wants to see Dr.Yates for a second opinion. She states that she has seen Dr.Nitka & the doctor that disability sent her to see( was not a specialist). And was told by disability that she needed a second opinion. Will Dr. Lorin Mercy be willing to see patient?

## 2020-04-02 NOTE — Telephone Encounter (Signed)
Please advise 

## 2020-04-03 ENCOUNTER — Telehealth: Payer: Self-pay | Admitting: Specialist

## 2020-04-03 ENCOUNTER — Telehealth: Payer: Self-pay | Admitting: Orthopaedic Surgery

## 2020-04-03 ENCOUNTER — Telehealth (HOSPITAL_COMMUNITY): Payer: Medicaid Other | Admitting: Psychiatry

## 2020-04-03 ENCOUNTER — Encounter: Payer: Self-pay | Admitting: Nurse Practitioner

## 2020-04-03 NOTE — Telephone Encounter (Signed)
Please see Note from 04/03/20, Kellie Martinez called to get patient scheduled and her boyfriend kept interrupting and was cursing on the phone. We offered her appt and he refused it.

## 2020-04-03 NOTE — Telephone Encounter (Signed)
Patient's fiance'e Francia Greaves called asked if patient can be referred to Dr. Lorin Mercy for a second opinion for her neck.  He said patient is in a lot of pain. The number to contact Francia Greaves is [phone]

## 2020-04-03 NOTE — Telephone Encounter (Signed)
FYI

## 2020-04-03 NOTE — Telephone Encounter (Signed)
I called and discussed.  Patient states she has fluid on her shoulder and she is disabled.  She states she has other disc problems in her neck.  Her fianc was present got on the phone and yelled and states that MRI of the shoulder should have been done with contrast because of the way his had been done and need already had 2 MRIs.  Patient states her shoulder is terrible.  I reviewed the MRI scan report which shows normal exam.  She can go elsewhere and get a second opinion.

## 2020-04-03 NOTE — Telephone Encounter (Signed)
Patient's fiance'e  Francia Greaves called wanting to know why patient's appointment was canceled? He was very upset and wanted a call back as soon as possible. The number to contact Francia Greaves is [phone]

## 2020-04-03 NOTE — Telephone Encounter (Signed)
I called patient to give her the status of the request for a 2nd op from Dr.Yates and patient fiance Hall Busing got on the phone and started yelling/ cursing. Saying that its our fault the patient was in so much pain. I asked them to hold on while I talk with [facility] & see if Dr.Nitka could see her. Christy told me to offer her the appointment for tomorrow. As I began to offer her the appointment for tomorrow the fiance Hall Busing got on the phone yelling talking about no she was not coming to the appointment and if she dies it was going to be our fault And that Dr.Nitka was a piece of shit & he was the reason the patient was messed up. I again asked to speak with the patient and offer her the appointment and he told me to stick the appointment up my ass. I explained to him that I was not going to listen to him, and that I was hanging up.

## 2020-04-04 ENCOUNTER — Other Ambulatory Visit: Payer: Self-pay | Admitting: Nurse Practitioner

## 2020-04-04 ENCOUNTER — Telehealth (HOSPITAL_COMMUNITY): Payer: Medicaid Other | Admitting: Psychiatry

## 2020-04-04 DIAGNOSIS — M5090 Cervical disc disorder, unspecified, unspecified cervical region: Secondary | ICD-10-CM

## 2020-04-04 NOTE — Telephone Encounter (Signed)
See below. I tried calling to further discuss. No answer.  Please also see other messages.

## 2020-04-04 NOTE — Telephone Encounter (Signed)
See previous messages.

## 2020-04-04 NOTE — Telephone Encounter (Signed)
I called patient back to see what happened yesterday. Per her boyfriend, they were on the phone with Dr. Lorin Mercy and her phone was echoing and made it sound like he was yelling, per Dr. Lorin Mercy she could go somewhere else for her 2nd opinion, she wanted to see Dr. Lorin Mercy for a 2nd opinion for her neck and Dr. Erlinda Hong for her shoulder.  They did not understand why her appt with Dr. Erlinda Hong was cancelled, but I advised that since he advised them to go somewhere else the appt was cancelled. I did try to get them an appt with Dr. Louanne Skye on Monday 04/07/20 at 2pm but they would not commit to it and all they would say is that they made her an appt at [facility] for her neck and shoulder, and they wanted her records to be picked up by them and he states that they have signed the release or her records.  He states that he was told "by the lady" yesterday that they could not be seen in our office at all anymore, so that's when he called and made the appt.  I advised that she is not the one that makes that decision, however she was probably trying to explain that we do not tolerate patients/anyone calling us and yelling or cursing at [place] on the phone or in the office and that if he were to come in acting like that they would have to leave. They have not stated that they will come back in and see Dr. Louanne Skye or not.

## 2020-04-04 NOTE — Telephone Encounter (Signed)
I tried calling to follow up. No answer. Will try to reach out next week.

## 2020-04-07 ENCOUNTER — Telehealth: Payer: Self-pay | Admitting: Specialist

## 2020-04-07 NOTE — Telephone Encounter (Signed)
Tried again.  No answer.

## 2020-04-07 NOTE — Telephone Encounter (Signed)
Received vm from pt asking if her records were ready. IC, spoke with pts fiance, (per pts request)Kellie Martinez, advised records ready Friday 2/25 that I called pt and left msg on pts vm. He asked if MRI cervical was in there as well. I advised MRI images will need to be obtained from she had done at. He stated he brought in outside CD. I told him I would look into and place any outside CD's in envelope with records.

## 2020-04-07 NOTE — Telephone Encounter (Signed)
Outside CD's placed with medical records : [facility] 08/10/19,[facility] 10/29/18-07/2019 & [facility] CT Cervical 07/28/2019, [facility] Bil elbow

## 2020-04-08 ENCOUNTER — Ambulatory Visit: Payer: Medicaid Other | Admitting: Orthopaedic Surgery

## 2020-04-10 ENCOUNTER — Emergency Department (HOSPITAL_COMMUNITY): Payer: Medicaid Other

## 2020-04-10 ENCOUNTER — Emergency Department (HOSPITAL_COMMUNITY)
Admission: EM | Admit: 2020-04-10 | Discharge: 2020-04-10 | Disposition: A | Discharge status: Home / Self Care | Payer: Medicaid Other | Attending: Emergency Medicine | Admitting: Emergency Medicine

## 2020-04-10 ENCOUNTER — Other Ambulatory Visit: Payer: Self-pay

## 2020-04-10 ENCOUNTER — Encounter (HOSPITAL_COMMUNITY): Payer: Self-pay

## 2020-04-10 DIAGNOSIS — W109XXA Fall (on) (from) unspecified stairs and steps, initial encounter: Secondary | ICD-10-CM | POA: Insufficient documentation

## 2020-04-10 DIAGNOSIS — M79642 Pain in left hand: Secondary | ICD-10-CM | POA: Diagnosis not present

## 2020-04-10 DIAGNOSIS — N189 Chronic kidney disease, unspecified: Secondary | ICD-10-CM | POA: Insufficient documentation

## 2020-04-10 DIAGNOSIS — Y93K1 Activity, walking an animal: Secondary | ICD-10-CM | POA: Insufficient documentation

## 2020-04-10 DIAGNOSIS — M25561 Pain in right knee: Secondary | ICD-10-CM | POA: Diagnosis not present

## 2020-04-10 DIAGNOSIS — M7989 Other specified soft tissue disorders: Secondary | ICD-10-CM | POA: Diagnosis not present

## 2020-04-10 DIAGNOSIS — S93401A Sprain of unspecified ligament of right ankle, initial encounter: Secondary | ICD-10-CM | POA: Diagnosis not present

## 2020-04-10 DIAGNOSIS — F17210 Nicotine dependence, cigarettes, uncomplicated: Secondary | ICD-10-CM | POA: Insufficient documentation

## 2020-04-10 DIAGNOSIS — S99911A Unspecified injury of right ankle, initial encounter: Secondary | ICD-10-CM | POA: Diagnosis present

## 2020-04-10 DIAGNOSIS — S60222A Contusion of left hand, initial encounter: Secondary | ICD-10-CM | POA: Diagnosis not present

## 2020-04-10 DIAGNOSIS — J45909 Unspecified asthma, uncomplicated: Secondary | ICD-10-CM | POA: Insufficient documentation

## 2020-04-10 LAB — POC URINE PREG, ED: Preg Test, Ur: NEGATIVE

## 2020-04-10 IMAGING — DX DG HAND COMPLETE 3+V*L*
3 series · 3 of 3 positions shown · non-contrast
Comparison: None.

CLINICAL DATA: Fall, pain and swelling in left hand

EXAM:
LEFT HAND - COMPLETE 3+ VIEW

[hand pa]
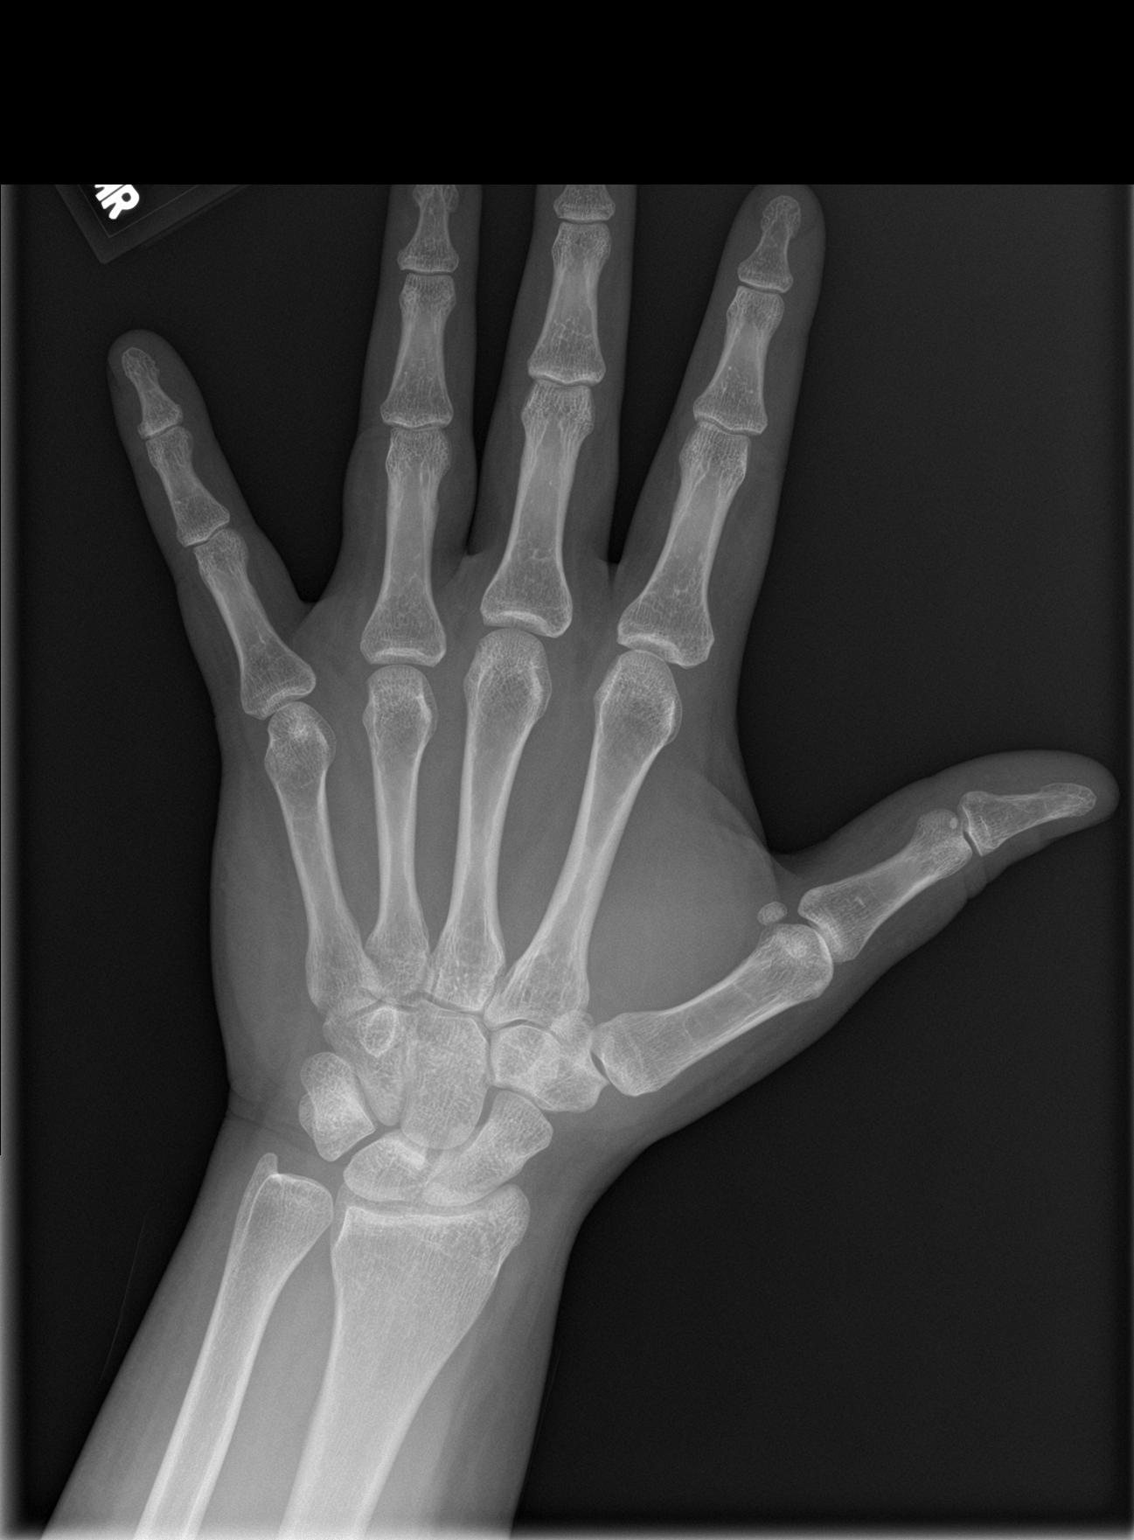

[hand obl]
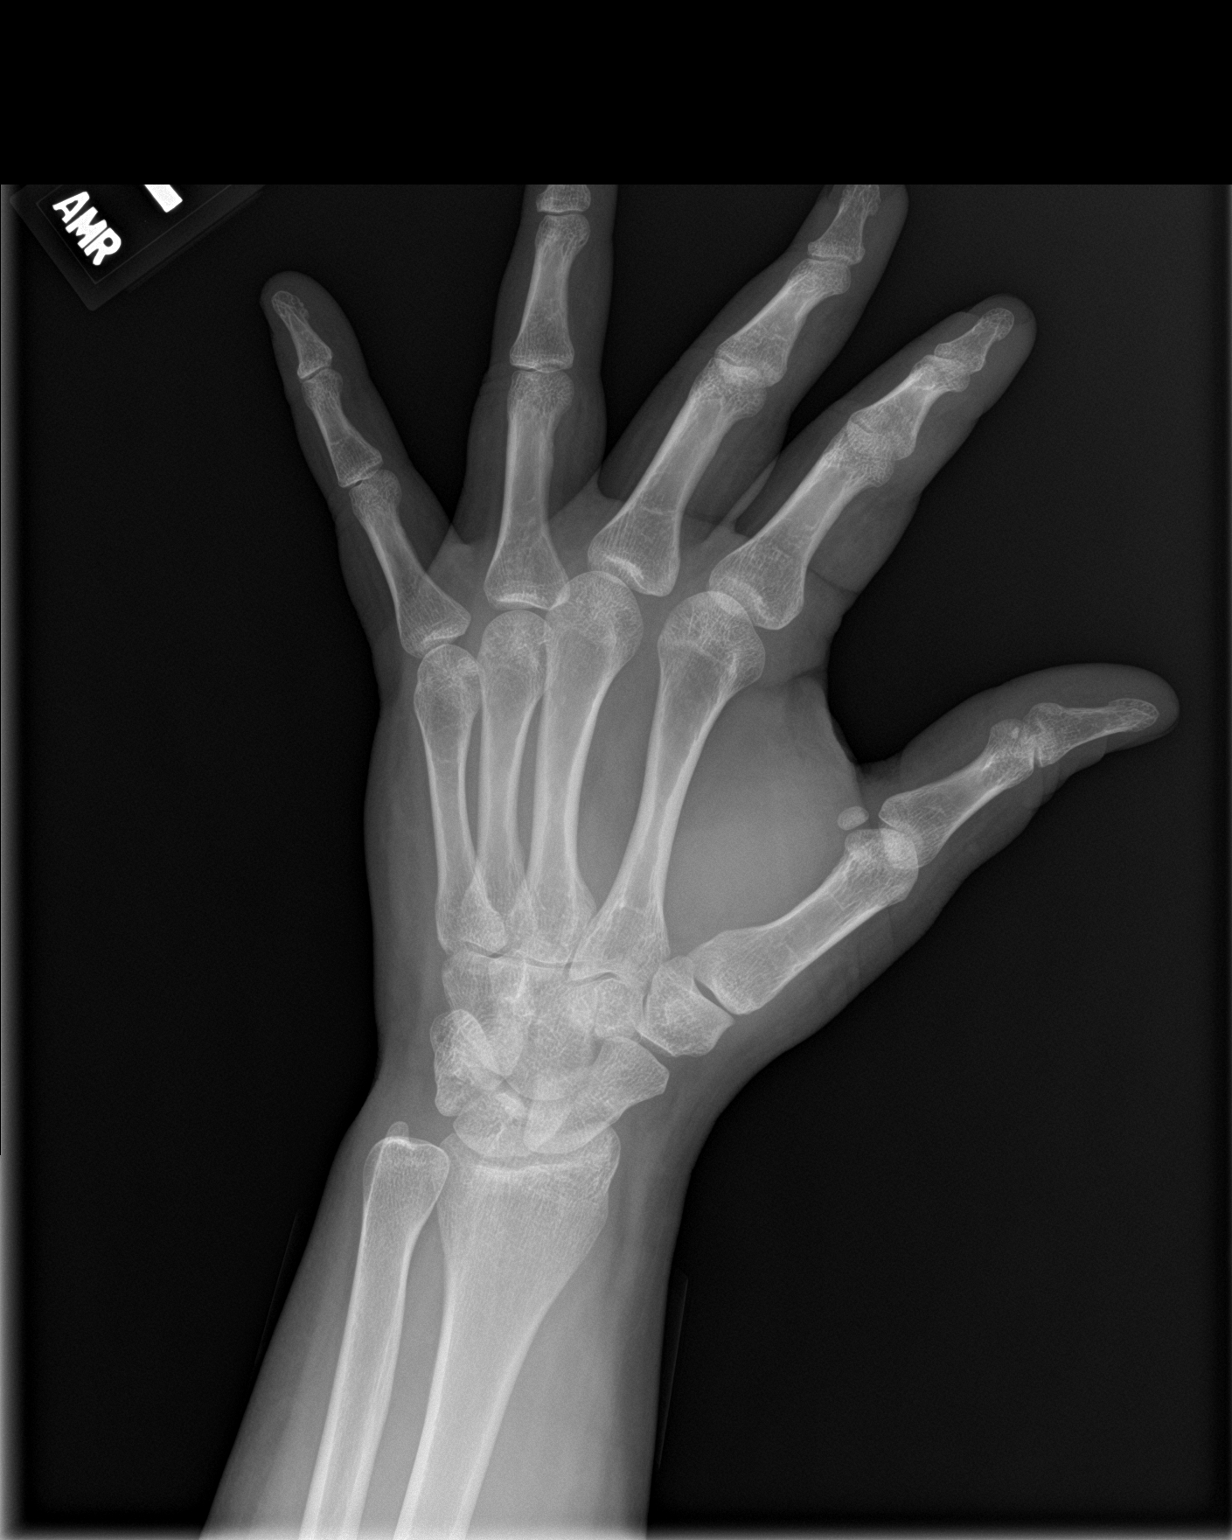

[hand lat]
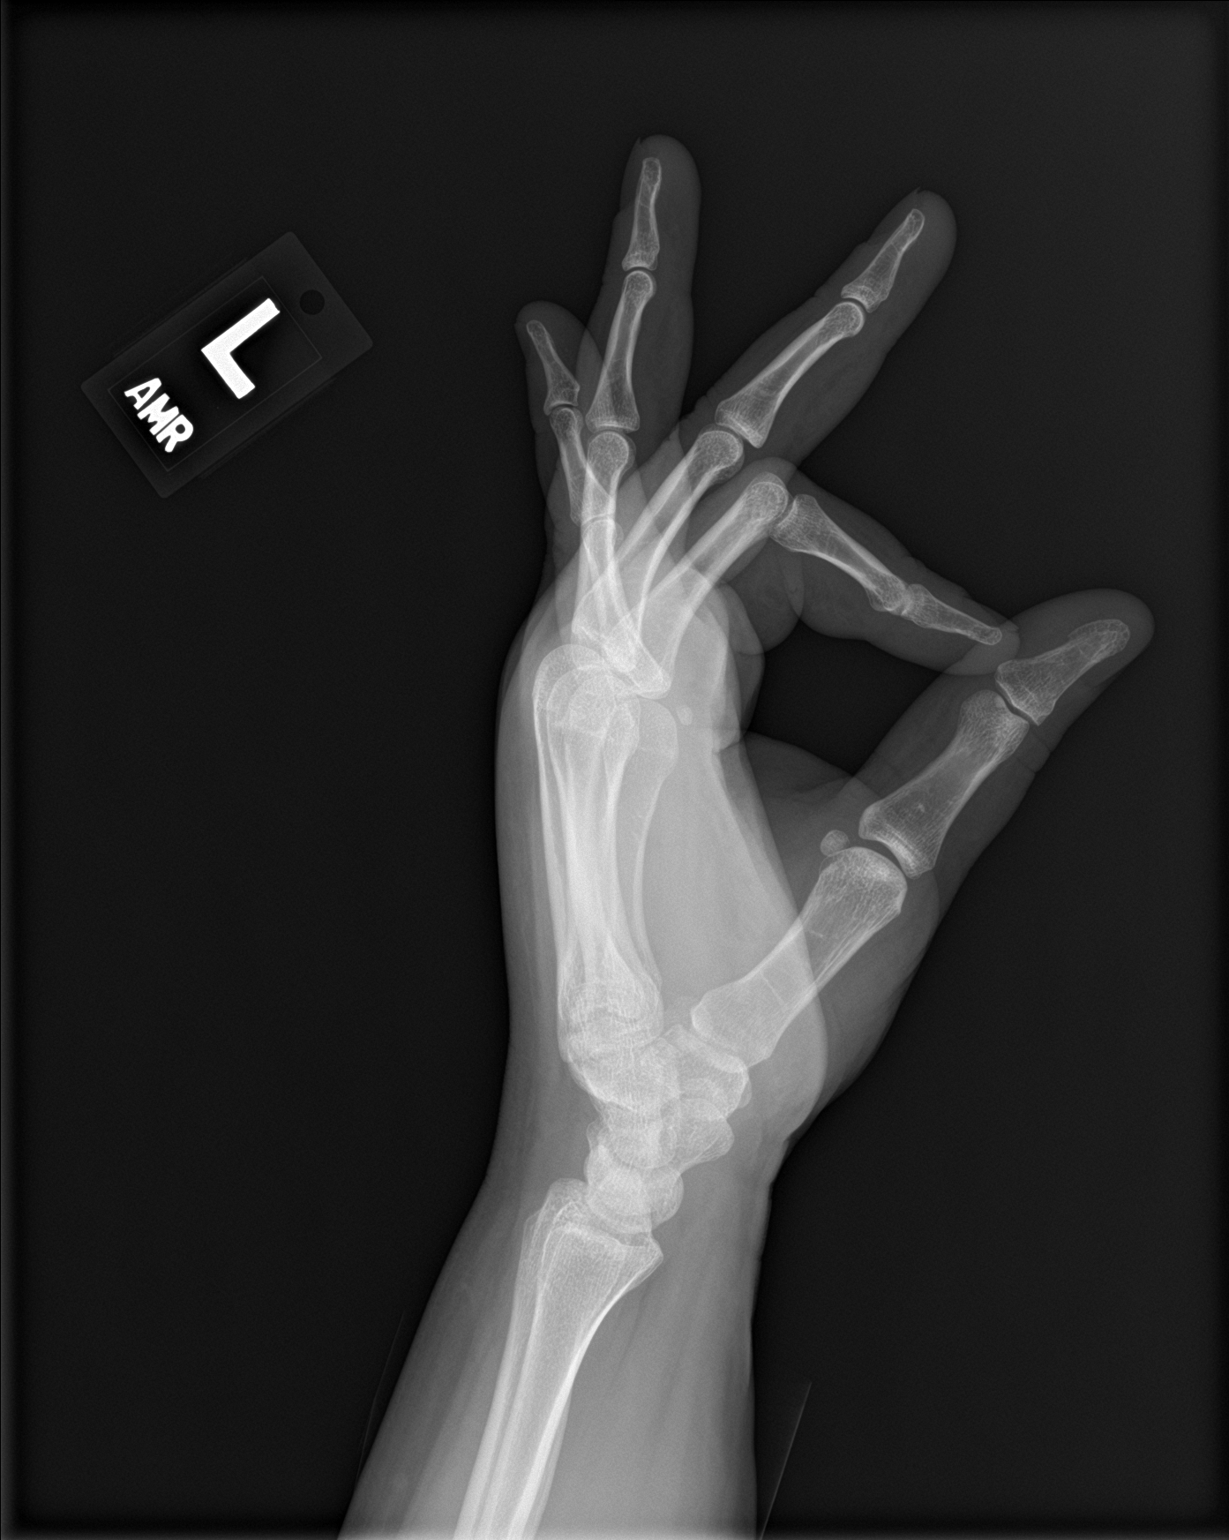

[3 of 3 positions shown; findings below may reference images not displayed]

FINDINGS: There is no evidence of fracture or dislocation. There is no
evidence of arthropathy or other focal bone abnormality. Soft
tissues are unremarkable.
IMPRESSION: Negative.

## 2020-04-10 IMAGING — DX DG ANKLE COMPLETE 3+V*R*
3 series · 3 of 3 positions shown · non-contrast
Comparison: None.

CLINICAL DATA: Fall, pain swelling in right lateral ankle

EXAM:
RIGHT ANKLE - COMPLETE 3+ VIEW

[ankle ap]
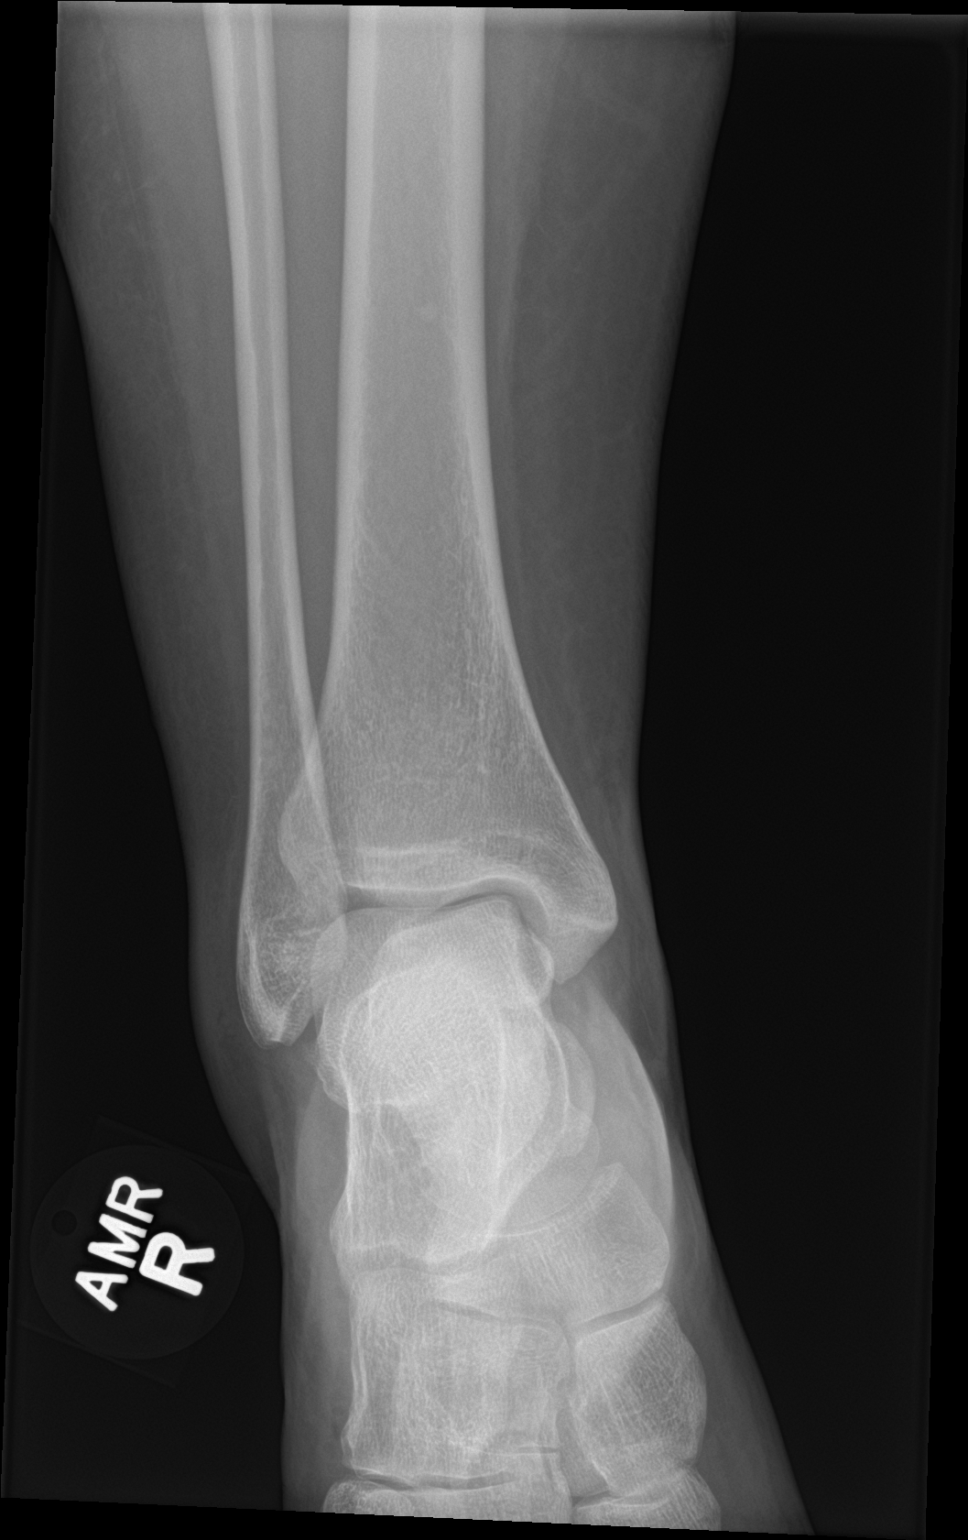

[ankle obl]
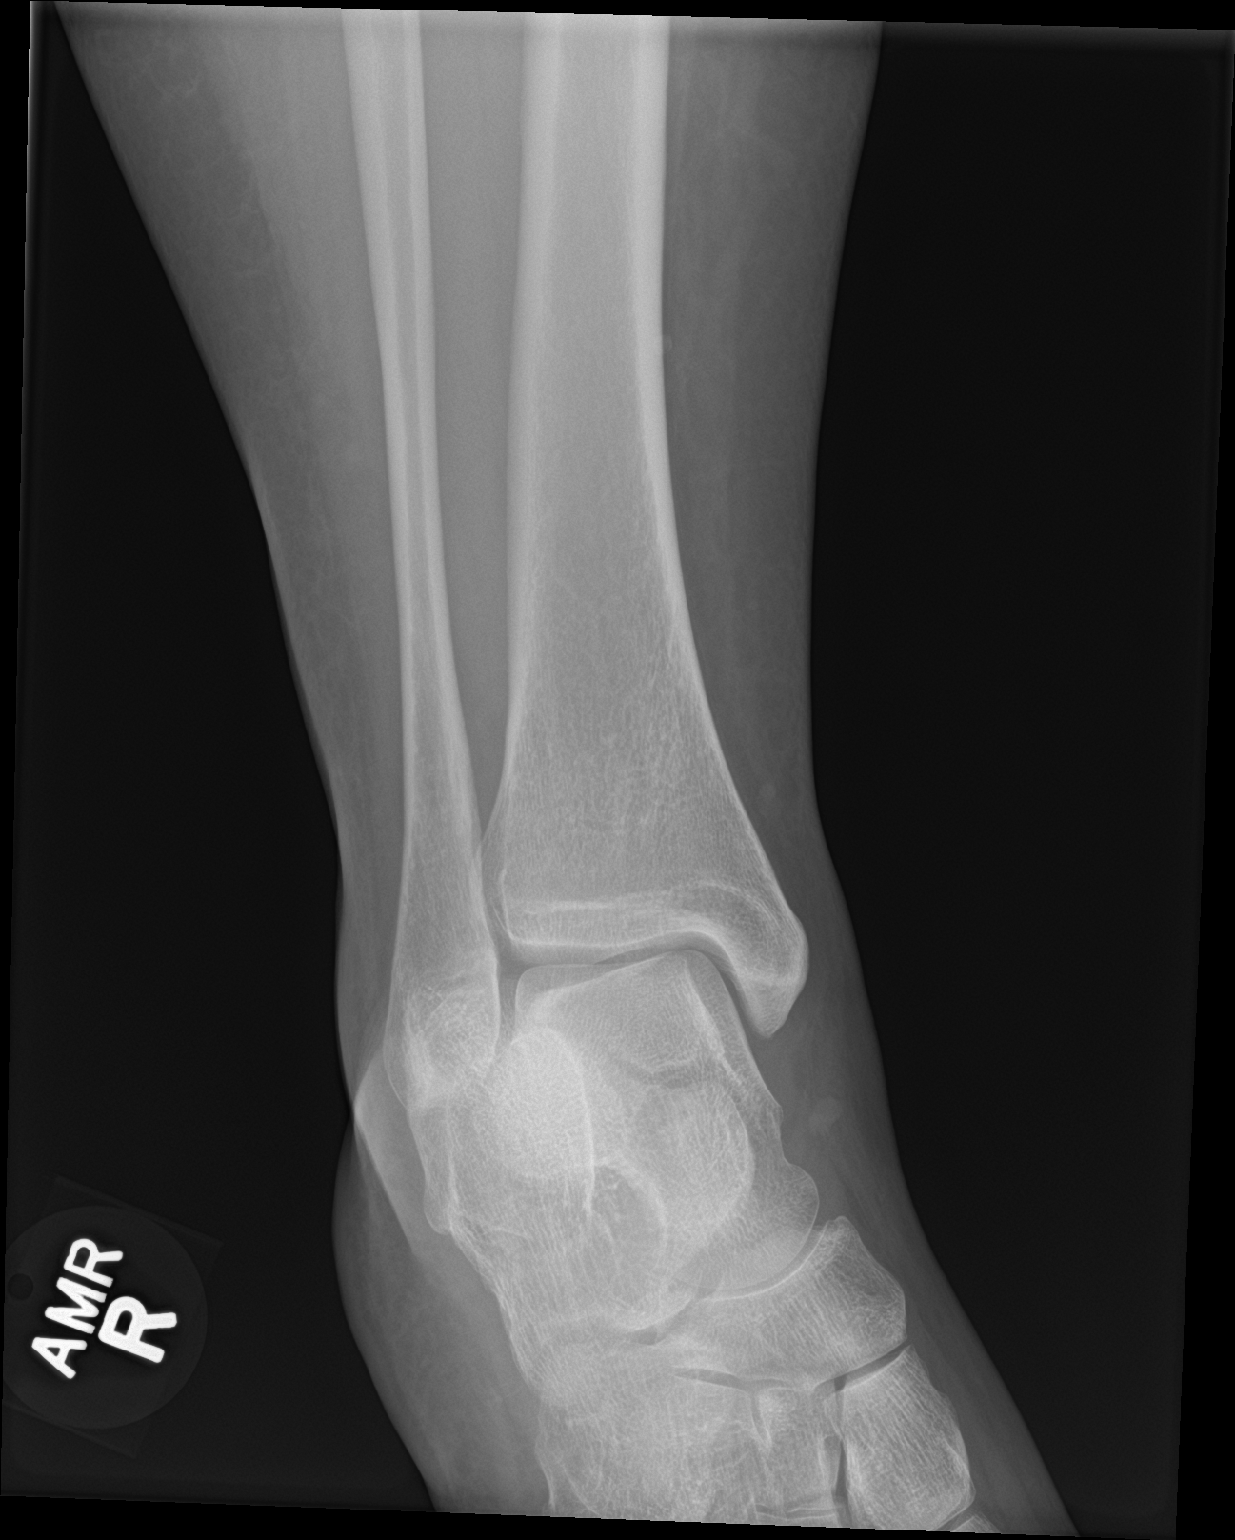

[ankle lat]
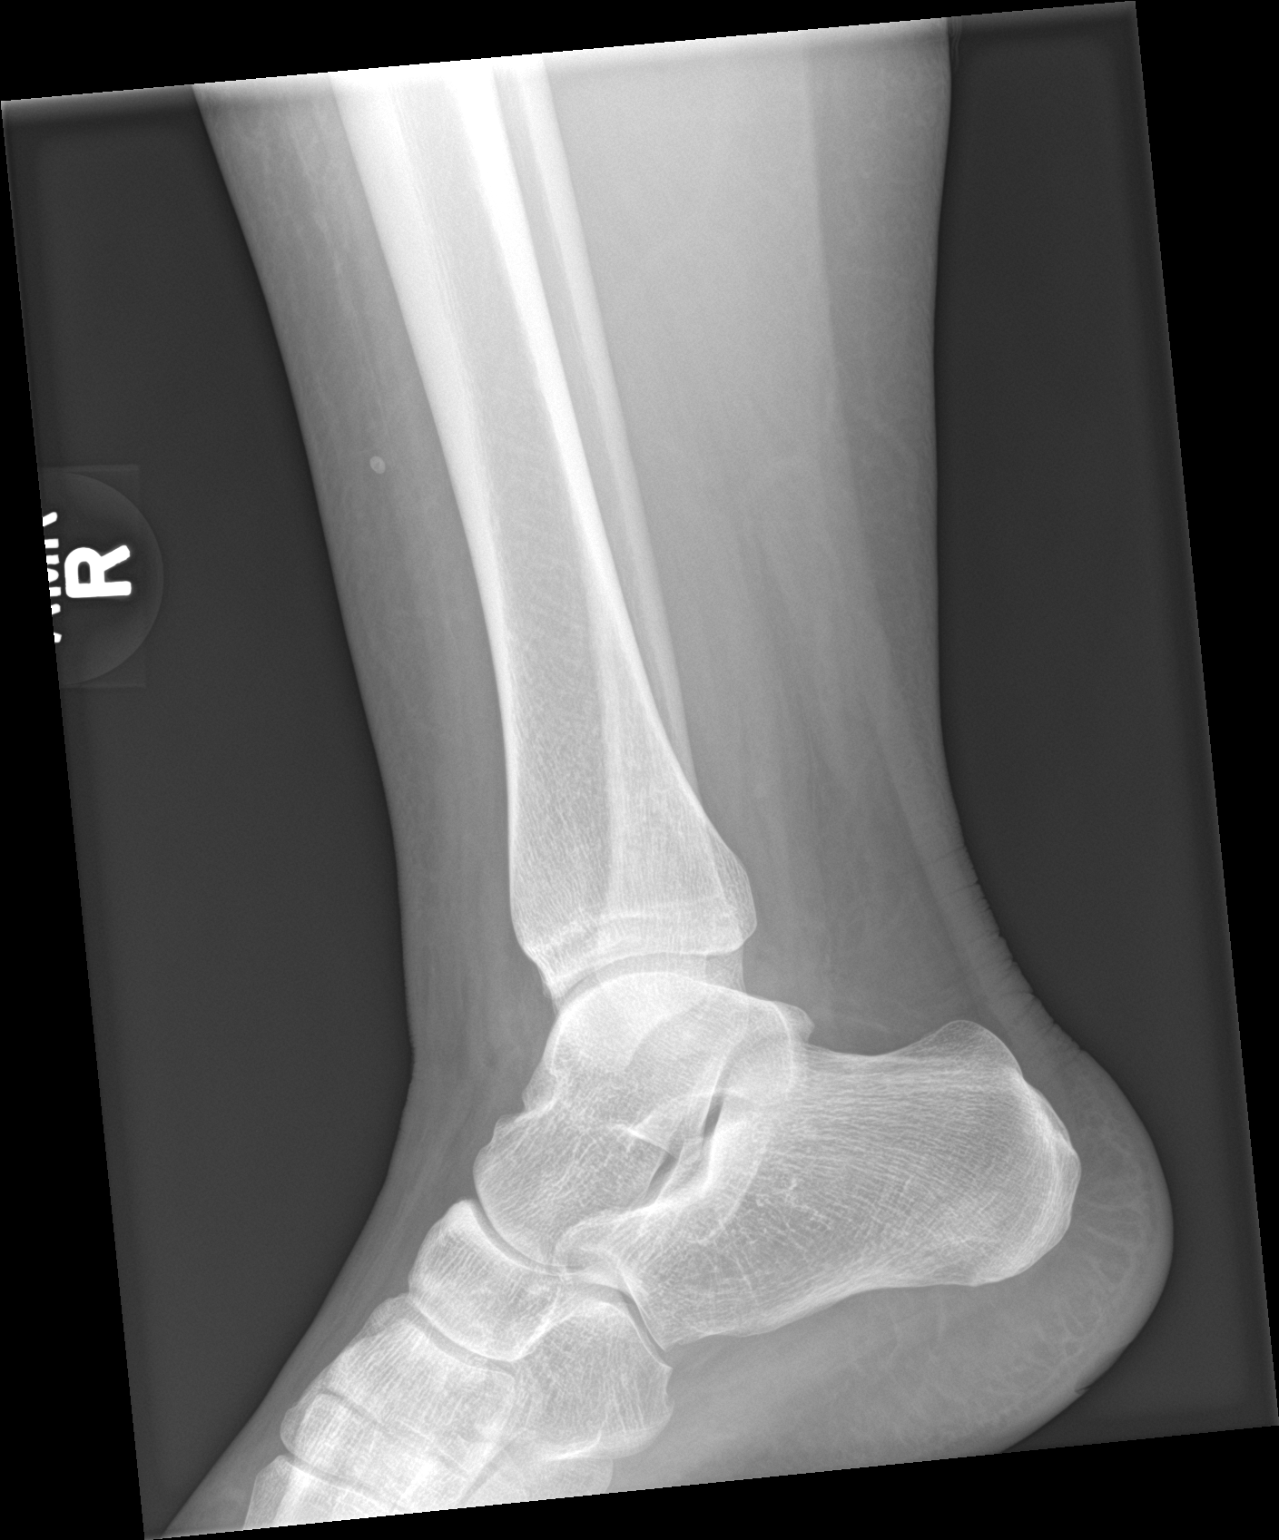

[3 of 3 positions shown; findings below may reference images not displayed]

FINDINGS: No acute bony abnormality. Specifically, no fracture, subluxation,
or dislocation. Joint spaces maintained. Soft tissues are intact.
IMPRESSION: Negative.

## 2020-04-10 IMAGING — DX DG KNEE COMPLETE 4+V*R*
4 series · 4 of 4 positions shown · non-contrast
Comparison: None.

CLINICAL DATA: Fall, pain

EXAM:
RIGHT KNEE - COMPLETE 4+ VIEW

[knee ap]
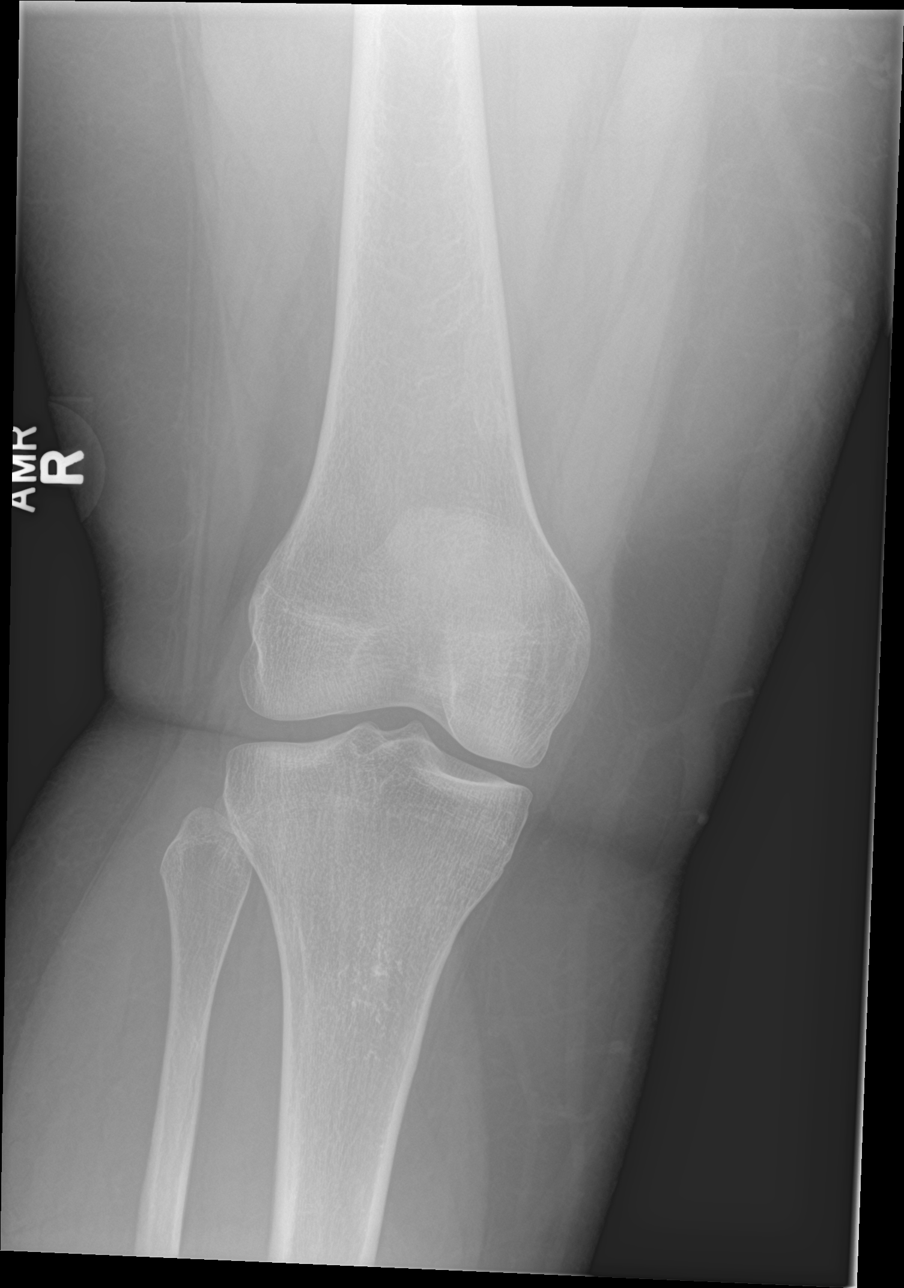

[knee obl (1 of 2)]
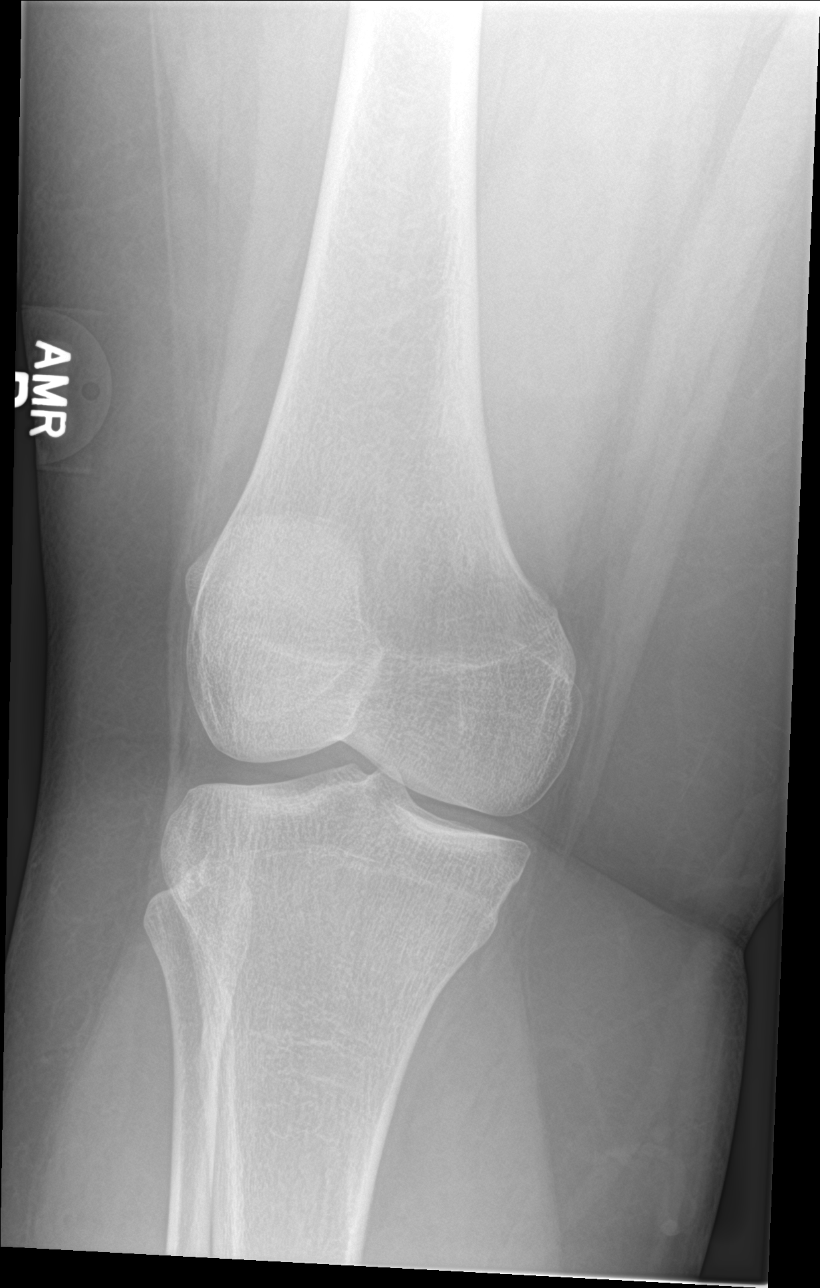

[knee obl (2 of 2)]
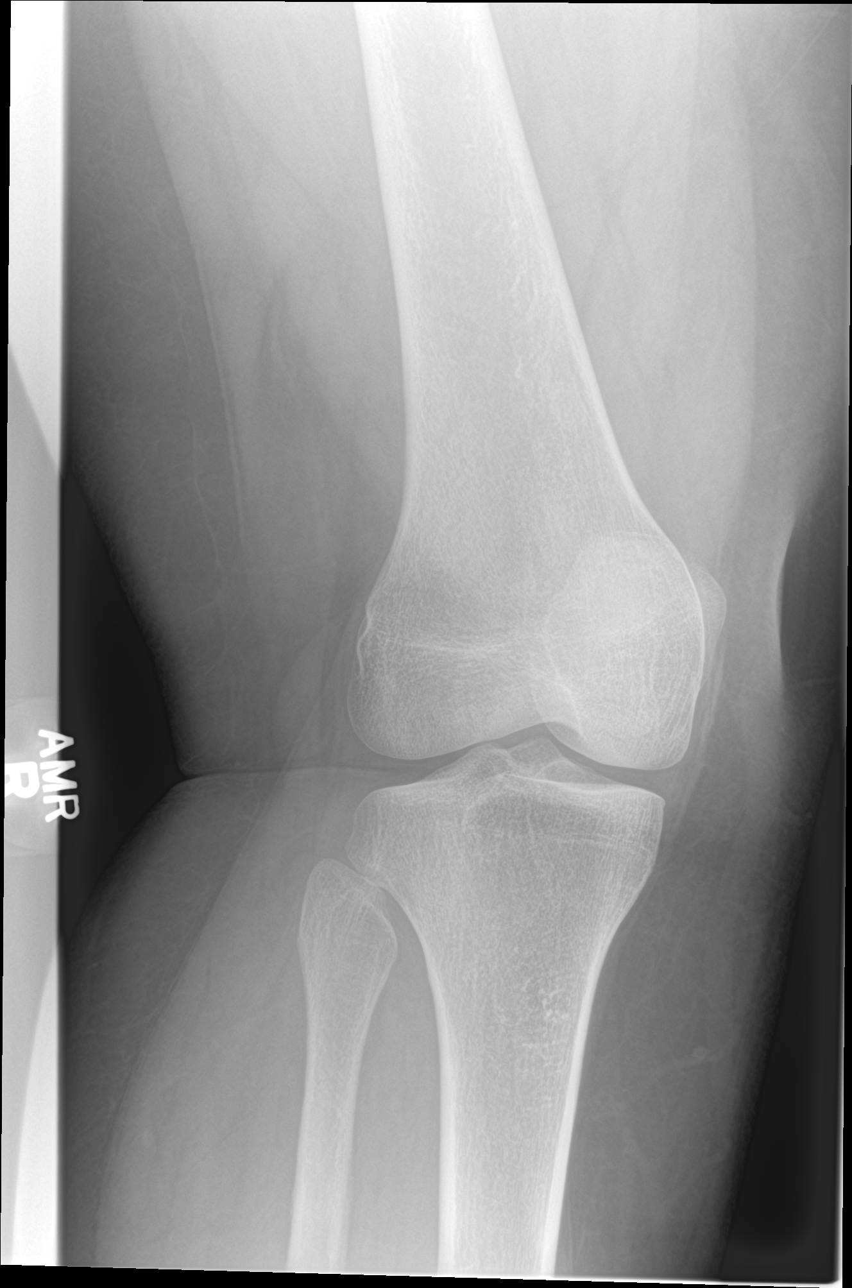

[knee lat]
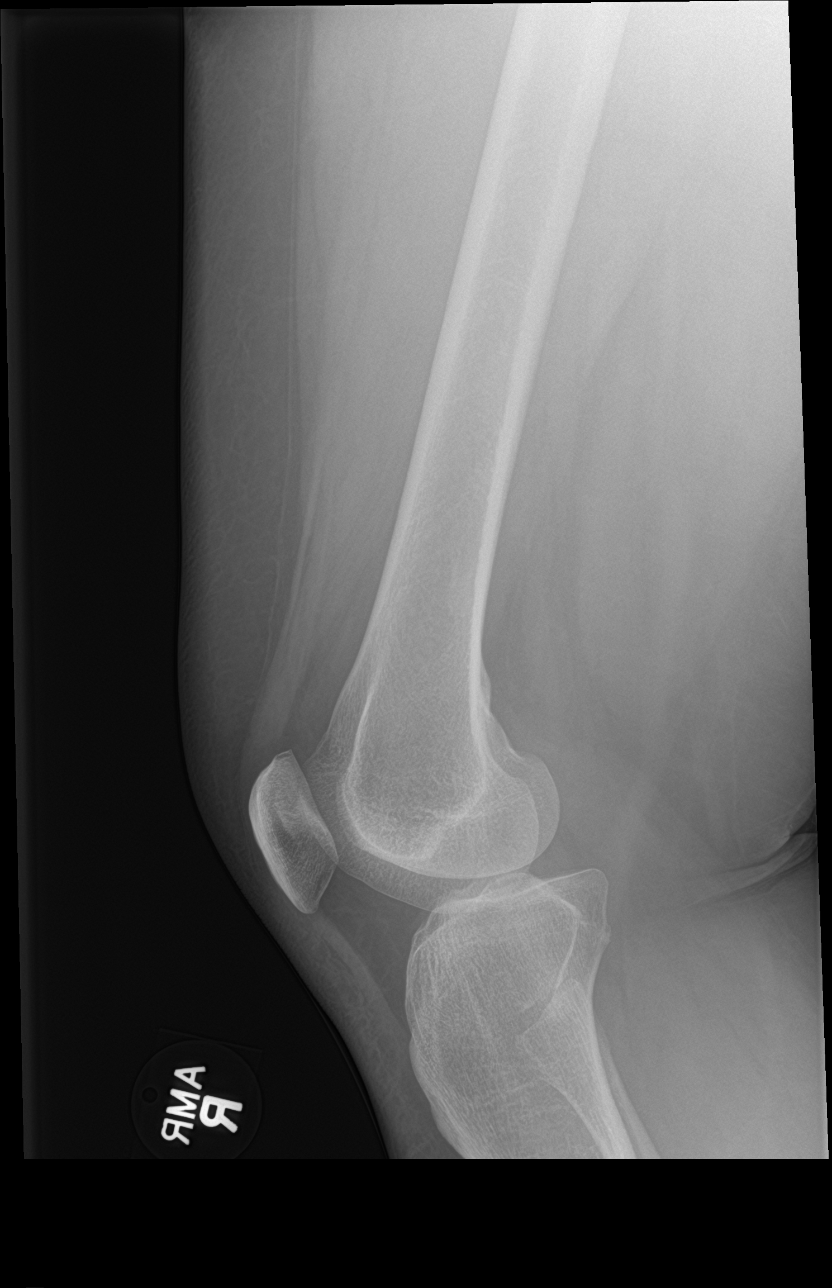

[4 of 4 positions shown; findings below may reference images not displayed]

FINDINGS: No evidence of fracture, dislocation, or joint effusion. No evidence
of arthropathy or other focal bone abnormality. Soft tissues are
unremarkable.
IMPRESSION: Negative.

## 2020-04-10 MED ORDER — DICLOFENAC SODIUM 75 MG PO TBEC: 75.0000 mg | DELAYED_RELEASE_TABLET | Freq: Two times a day (BID) | ORAL | 0 refills | Status: DC

## 2020-04-10 NOTE — Discharge Instructions (Addendum)
Wear the ASO and use crutches to help you avoid increasing pain in the ankle as it continues to heal.   Use ice and elevation as much as possible for the next several days to help reduce the swelling.  Take the medicine prescribed which should also help with pain and swelling. Your xrays are negative for acute bony injuries (no fractures or dislocated bones).  See your MD in 10 days if not improving with this treatment plan.

## 2020-04-10 NOTE — ED Triage Notes (Signed)
Pt fell yesterday walking dogs down steps. Pt left hand pinky finger and ring finger swollen from dog leash pulling, states she cant feel anything.  Right ankle pain, swelling from fall , unable to put pressure on foot.

## 2020-04-10 NOTE — ED Notes (Signed)
Pt refused ice

## 2020-04-10 NOTE — ED Provider Notes (Signed)
[facility] EMERGENCY DEPARTMENT Provider Note   CSN: [id number] Arrival date & time: 04/10/20  7782     History Chief Complaint  Patient presents with  . Ankle Pain    Kellie Martinez is a 25 y.o. female presenting for evaluation of pain to her right ankle and left ring and fifth fingers after she fell down 8 steps yesterday. She was attempting to walk her dogs which were released and she got pulled on the steps, twisting her right ankle. She states her hand injury occurred when her fingers got pulled by the dog's leash. She reports numbness in her ring and fifth fingers although also endorses pain. She has been able to weight-bear with the right extremity, but describes toe-touch only and has been using crutches as well. She does have pain that radiates into the right lateral knee region. Denies any other injuries.  Applied heat to her ankle after the event without relief.   The history is provided by the patient.       Past Medical History:  Diagnosis Date  . Anemia   . Anxiety   . Arthritis    lower back  . Asthma   . Bipolar 1 disorder ([place])   . 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  . Chronic kidney disease   . Depression   . GERD (gastroesophageal reflux disease)   . Headache    otc med prn  . History of kidney stones   . HLD (hyperlipidemia)    diet controlled  . Molar pregnancy   . Pneumonia    x 1 yrs ago per patient 11/22/19  . Renal calculus or stone    had stent/ lithotripsy in past  . Sleep apnea    does not use cpap  . Substance abuse ([place])   . Suicide attempt ([place])    x 2    Patient Active Problem List   Diagnosis Date Noted  . Herniation of cervical intervertebral disc with radiculopathy 11/26/2019    Class: Chronic  . Cervical disc disorder at C5-C6 level with radiculopathy 11/26/2019  . GAD (generalized anxiety disorder) 01/03/2019  . Attention deficit hyperactivity disorder (ADHD), combined type 01/03/2019  . Bipolar 2 disorder ([place]) 01/03/2019  .  Precordial pain 07/07/2017  . History of renal stone 05/18/2017  . Umbilical hernia without obstruction and without gangrene 05/18/2017  . 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 Difficulty sleeping 05/06/2017  . Gastroesophageal reflux disease without esophagitis 05/06/2017  . History of drug overdose 05/06/2017    Past Surgical History:  Procedure Laterality Date  . CERVICAL DISC ARTHROPLASTY N/A 11/26/2019   Procedure: CERVICAL DISC ARTHROPLASTY C5-6 WITH PRO Antonito;  Surgeon: Jessy Oto, MD;  Location: [facility];  Service: Orthopedics;  Laterality: N/A;  . CYSTOSCOPY     x2 with stone extraction  . EXTRACORPOREAL SHOCK WAVE LITHOTRIPSY Right 06/20/2013   Procedure: EXTRACORPOREAL SHOCK WAVE LITHOTRIPSY (ESWL);  Surgeon: Edwin Dada, MD;  Location: [facility];  Service: Urology;  Laterality: Right;  . FOOT SURGERY Left 2003  . LITHOTRIPSY    . TUBAL LIGATION  2017  . TUBAL LIGATION    . UMBILICAL HERNIA REPAIR N/A 03/28/2020   Procedure: HERNIA REPAIR UMBILICAL ADULT;  Surgeon: Virl Cagey, MD;  Location: [facility];  Service: General;  Laterality: N/A;     OB History   No obstetric history on file.     Family History  Problem Relation Age of Onset  . Depression Mother   .  Asthma Mother   . Hypertension Mother   . Mood Disorder Mother   . Heart disease Father   . Kidney disease Father   . Alcoholism Father   . Breast cancer Paternal Aunt   . Breast cancer Maternal Grandmother   . Breast cancer Cousin   . Alcoholism Brother   . Drug abuse Brother     Social History   Tobacco Use  . Smoking status: Current Every Day Smoker    Packs/day: 0.25    Types: Cigarettes  . Smokeless tobacco: Never Used  . Tobacco comment: 3 cig/day  Vaping Use  . Vaping Use: Former  . Quit date: 02/01/2019  Substance Use Topics  . Alcohol use: Not Currently    Comment:  weekends wine/liquor  . Drug use: Not Currently    Types: Marijuana    Comment: Last  use - 11/17/19    Home Medications Prior to Admission medications   Medication Sig Start Date End Date Taking? Authorizing Provider  albuterol (VENTOLIN HFA) 108 (90 Base) MCG/ACT inhaler Inhale 2 puffs into the lungs every 6 (six) hours as needed for wheezing or shortness of breath.   Yes [provider]  amphetamine-dextroamphetamine (ADDERALL XR) 25 MG 24 hr capsule Take 1 capsule by mouth every morning. 03/21/20 04/20/20 Yes Pucilowski, Olgierd A, MD  clonazePAM (KLONOPIN) 1 MG tablet Take 1 tablet (1 mg total) by mouth 3 (three) times daily as needed for anxiety. 02/28/20 05/28/20 Yes Pucilowski, Olgierd A, MD  diclofenac (VOLTAREN) 75 MG EC tablet Take 1 tablet (75 mg total) by mouth 2 (two) times daily. 04/10/20  Yes Idol, Almyra Free, PA-C  escitalopram (LEXAPRO) 20 MG tablet Take 1.5 tablets (30 mg total) by mouth daily. 03/13/20 06/11/20 Yes Pucilowski, Olgierd A, MD  lamoTRIgine (LAMICTAL) 150 MG tablet Take 1 tablet (150 mg total) by mouth 2 (two) times daily. 02/28/20 05/28/20 Yes Pucilowski, Olgierd A, MD  ondansetron (ZOFRAN) 4 MG tablet Take 1 tablet (4 mg total) by mouth every 8 (eight) hours as needed. 03/28/20 03/28/21 Yes Virl Cagey, MD  oxyCODONE (ROXICODONE) 5 MG immediate release tablet Take 1 tablet (5 mg total) by mouth every 4 (four) hours as needed for severe pain or breakthrough pain. 03/28/20 03/28/21 Yes Virl Cagey, MD  zaleplon (SONATA) 10 MG capsule Take 1 capsule (10 mg total) by mouth at bedtime as needed for sleep. Patient taking differently: Take 10 mg by mouth at bedtime. 04/07/20 06/06/20 Yes Pucilowski, Olgierd A, MD  fluticasone (FLONASE) 50 MCG/ACT nasal spray Place 1 spray into both nostrils daily as needed for allergies or rhinitis.    [provider]  ibuprofen (ADVIL) 200 MG tablet Take 200-400 mg by mouth every 6 (six) hours as needed for moderate pain or headache.    [provider]  methocarbamol (ROBAXIN) 500 MG tablet Take 500 mg  by mouth every 8 (eight) hours as needed for muscle spasms. 02/28/20   [provider]  mirtazapine (REMERON SOL-TAB) 15 MG disintegrating tablet Take 15 mg by mouth at bedtime. 02/28/20   [provider]    Allergies    5-alpha reductase inhibitors, Chloramphenicols, Levofloxacin, Penicillins, Phenergan [promethazine hcl], and Seroquel [quetiapine]  Review of Systems   Review of Systems  Musculoskeletal: Positive for arthralgias and joint swelling. Negative for myalgias.  Skin: Negative for color change and wound.  Neurological: Negative for weakness and numbness.  All other systems reviewed and are negative.   Physical Exam Updated Vital Signs BP 90/65  Pulse 79   Temp 98.4 F (36.9 C) (Oral)   Resp 16   Ht 5\' 2"  (1.575 m)   Wt 94.3 kg   LMP 03/17/2020   SpO2 97%   BMI 38.04 kg/m   Physical Exam Vitals and nursing note reviewed.  Constitutional:      Appearance: She is well-developed and well-nourished.  HENT:     Head: Normocephalic and atraumatic.  Cardiovascular:     Rate and Rhythm: Normal rate.     Pulses: Intact distal pulses. No decreased pulses.          Dorsalis pedis pulses are 2+ on the right side and 2+ on the left side.       Posterior tibial pulses are 2+ on the right side and 2+ on the left side.     Comments: Pulses equal bilaterally Pulmonary:     Effort: Pulmonary effort is normal.  Musculoskeletal:        General: Tenderness and edema present. No swelling.     Left hand: Swelling, tenderness and bony tenderness present. No deformity. Decreased range of motion. Normal sensation.     Cervical back: Normal range of motion.     Right ankle: No swelling or ecchymosis. Tenderness present over the lateral malleolus and proximal fibula. No head of 5th metatarsal tenderness. Decreased range of motion. Normal pulse.     Right Achilles Tendon: Normal. No defects.     Comments: Mild edema base of 4th and 5th left fingers.  No deformity. Pt  endorses pain with palpation along the length of these fingers, but also reports decreased sensation.  She is able to flex/ext but not fully secondary to pain,  Holding fingers splayed.  Cap refill less than 2 seconds.   Skin:    General: Skin is warm, dry and intact.  Neurological:     Mental Status: She is alert.     Sensory: No sensory deficit.     Deep Tendon Reflexes: Strength normal. Reflexes normal.  Psychiatric:        Mood and Affect: Mood and affect normal.     ED Results / Procedures / Treatments   Labs (all labs ordered are listed, but only abnormal results are displayed) Labs Reviewed  POC URINE PREG, ED    EKG None  Radiology DG Ankle Complete Right  Result Date: 04/10/2020 CLINICAL DATA:  Fall, pain swelling in right lateral ankle EXAM: RIGHT ANKLE - COMPLETE 3+ VIEW COMPARISON:  None. FINDINGS: No acute bony abnormality. Specifically, no fracture, subluxation, or dislocation. Joint spaces maintained. Soft tissues are intact. IMPRESSION: Negative. Electronically Signed   By: Rolm Baptise M.D.   On: 04/10/2020 09:52   DG Knee Complete 4 Views Right  Result Date: 04/10/2020 CLINICAL DATA:  Fall, pain EXAM: RIGHT KNEE - COMPLETE 4+ VIEW COMPARISON:  None. FINDINGS: No evidence of fracture, dislocation, or joint effusion. No evidence of arthropathy or other focal bone abnormality. Soft tissues are unremarkable. IMPRESSION: Negative. Electronically Signed   By: Rolm Baptise M.D.   On: 04/10/2020 09:53   DG Hand Complete Left  Result Date: 04/10/2020 CLINICAL DATA:  Fall, pain and swelling in left hand EXAM: LEFT HAND - COMPLETE 3+ VIEW COMPARISON:  None. FINDINGS: There is no evidence of fracture or dislocation. There is no evidence of arthropathy or other focal bone abnormality. Soft tissues are unremarkable. IMPRESSION: Negative. Electronically Signed   By: Rolm Baptise M.D.   On: 04/10/2020 09:51    Procedures Procedures  Medications Ordered in ED Medications - No  data to display  ED Course  I have reviewed the triage vital signs and the nursing notes.  Pertinent labs & imaging results that were available during my care of the patient were reviewed by me and considered in my medical decision making (see chart for details).    MDM Rules/Calculators/A&P                          RICE,aso, crutches. Prn f/u if not improving over the next 10 days with pcp.  Discussed injury may take weeks to heal, should wear aso until pain free.    Final Clinical Impression(s) / ED Diagnoses Final diagnoses:  Sprain of right ankle, unspecified ligament, initial encounter  Contusion of left hand, initial encounter    Rx / DC Orders ED Discharge Orders         Ordered    diclofenac (VOLTAREN) 75 MG EC tablet  2 times daily        04/10/20 1024           Evalee Jefferson, Hershal Coria 04/10/20 1028    Hayden Rasmussen, MD 04/10/20 2040

## 2020-04-10 NOTE — ED Notes (Signed)
Pt refused crutches. She has some at home

## 2020-04-11 ENCOUNTER — Telehealth: Payer: Self-pay

## 2020-04-11 NOTE — Telephone Encounter (Signed)
Transition Care Management Follow-up Telephone Call  Date of discharge and from where: 04/10/2020 from [facility]  How have you been since you were released from the hospital? Pt states that she is managing okay and doesn't have any questions.   Any questions or concerns? No  Items Reviewed:  Did the pt receive and understand the discharge instructions provided? Yes   Medications obtained and verified? Yes   Other? No   Any new allergies since your discharge? No   Dietary orders reviewed? n/a  Do you have support at home? Yes   Functional Questionnaire: (I = Independent and D = Dependent) ADLs: I  Bathing/Dressing- I  Meal Prep- I  Eating- I  Maintaining continence- I  Transferring/Ambulation- I  Managing Meds- I   Follow up appointments reviewed:   PCP Hospital f/u appt confirmed? No  Pt encouraged to call for an appt with PCP.   [facility] f/u appt confirmed? No    Are transportation arrangements needed? No   If their condition worsens, is the pt aware to call PCP or go to the Emergency Dept.? Yes  Was the patient provided with contact information for the PCP's office or ED? Yes  Was to pt encouraged to call back with questions or concerns? Yes

## 2020-04-15 ENCOUNTER — Other Ambulatory Visit (HOSPITAL_COMMUNITY): Payer: Self-pay | Admitting: Psychiatry

## 2020-04-16 ENCOUNTER — Other Ambulatory Visit: Payer: Self-pay

## 2020-04-16 ENCOUNTER — Telehealth (INDEPENDENT_AMBULATORY_CARE_PROVIDER_SITE_OTHER): Payer: Medicaid Other | Admitting: Psychiatry

## 2020-04-16 DIAGNOSIS — F902 Attention-deficit hyperactivity disorder, combined type: Secondary | ICD-10-CM

## 2020-04-16 DIAGNOSIS — F3181 Bipolar II disorder: Secondary | ICD-10-CM | POA: Diagnosis not present

## 2020-04-16 DIAGNOSIS — F411 Generalized anxiety disorder: Secondary | ICD-10-CM

## 2020-04-16 MED ORDER — ZALEPLON 10 MG PO CAPS: 10.0000 mg | ORAL_CAPSULE | Freq: Every evening | ORAL | 2 refills | Status: DC | PRN

## 2020-04-16 MED ORDER — AMPHETAMINE-DEXTROAMPHET ER 25 MG PO CP24: 25.0000 mg | ORAL_CAPSULE | ORAL | 0 refills | Status: DC

## 2020-04-16 MED ORDER — LAMOTRIGINE 150 MG PO TABS: 150.0000 mg | ORAL_TABLET | Freq: Two times a day (BID) | ORAL | 2 refills | Status: DC

## 2020-04-16 NOTE — Progress Notes (Addendum)
[place] MD/PA/NP OP Progress Note  04/16/2020 9:13 AM Kellie Martinez  MRN:  [id number] Interview was conducted by phone and I verified that I was speaking with the correct person using two identifiers. I discussed the limitations of evaluation and management by telemedicine and  the availability of in person appointments. Patient expressed understanding and agreed to proceed. Participants in the visit: patient (location - home); physician (location - home office).  Chief Complaint: Anxiety.  HPI: 25yo DWFwithbipolar disorder and ADHD(the latter diagnosedwhen she was 2-8 years old). Unfortunately she does not remember medications she has been tried for either of these conditions just says that "there were many". She seems to recall names of Ritalin and Adderall. Available record also shows that she was tried on SSRIs (citalopram, fluoxetine), aripiprazole, oxcarbazepine, hydroxyzine, clonazepam. She describes her mood swings as being very rapid - happening several times during the day. WestartedLamictal titration and escitalopram 10 mg daily. Initially she had muscle cramps and some sedation but both have resolved.She also complainedof difficulty with falling asleep - trazodone has not been very effective for that (150 mg)andregularAmbien only helpedher fall asleep but she is up after few hours.I have ordered Ambien CRbut it was not approved by her insurance.She also failedtemazepam, eszopiclone, mirtazapineand quetiapine (developed muscle twitching on it).We have added Adderall XR 20 mgandwhile she foundit helpful for concentrationis was notyet optimal. We increased dose to 30 mg and she reacted with increased anxiety(thesame happened when we tried Concerta 27 mg) but tolerates 25 mg well.She remains anxious, "stressed out", more so since her application for disability was denied (2nd time). I have written her a letter in support of her efforts to get disability income. Clonazepam for  anxiety is effective but she needs to use it daily.Sleep has improved after addition of Sonata.    Visit Diagnosis:    ICD-10-CM   1. Bipolar 2 disorder (HCC)  F31.81   2. Attention deficit hyperactivity disorder (ADHD), combined type  F90.2   3. GAD (generalized anxiety disorder)  F41.1     Past Psychiatric History: Please see intake H&P.  Past Medical History:  Past Medical History:  Diagnosis Date  . Anemia   . Anxiety   . Arthritis    lower back  . Asthma   . Bipolar 1 disorder ([place])   . 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  . Chronic kidney disease   . Depression   . GERD (gastroesophageal reflux disease)   . Headache    otc med prn  . History of kidney stones   . HLD (hyperlipidemia)    diet controlled  . Molar pregnancy   . Pneumonia    x 1 yrs ago per patient 11/22/19  . Renal calculus or stone    had stent/ lithotripsy in past  . Sleep apnea    does not use cpap  . Substance abuse ([place])   . Suicide attempt ([place])    x 2    Past Surgical History:  Procedure Laterality Date  . CERVICAL DISC ARTHROPLASTY N/A 11/26/2019   Procedure: CERVICAL DISC ARTHROPLASTY C5-6 WITH PRO [place];  Surgeon: Jessy Oto, MD;  Location: [facility];  Service: Orthopedics;  Laterality: N/A;  . CYSTOSCOPY     x2 with stone extraction  . EXTRACORPOREAL SHOCK WAVE LITHOTRIPSY Right 06/20/2013   Procedure: EXTRACORPOREAL SHOCK WAVE LITHOTRIPSY (ESWL);  Surgeon: Edwin Dada, MD;  Location: [facility];  Service: Urology;  Laterality: Right;  . FOOT SURGERY Left 2003  . LITHOTRIPSY    .  TUBAL LIGATION  2017  . TUBAL LIGATION    . UMBILICAL HERNIA REPAIR N/A 03/28/2020   Procedure: HERNIA REPAIR UMBILICAL ADULT;  Surgeon: Virl Cagey, MD;  Location: [facility];  Service: General;  Laterality: N/A;    Family Psychiatric History: Reviewed.  Family History:  Family History  Problem Relation Age of Onset  . Depression Mother   . Asthma Mother   . Hypertension Mother   . Mood Disorder Mother   .  Heart disease Father   . Kidney disease Father   . Alcoholism Father   . Breast cancer Paternal Aunt   . Breast cancer Maternal Grandmother   . Breast cancer Cousin   . Alcoholism Brother   . Drug abuse Brother     Social History:  Social History   Socioeconomic History  . Marital status: Single    Spouse name: Not on file  . Number of children: 3  . Years of education: Not on file  . Highest education level: Not on file  Occupational History  . Not on file  Tobacco Use  . Smoking status: Current Every Day Smoker    Packs/day: 0.25    Types: Cigarettes  . Smokeless tobacco: Never Used  . Tobacco comment: 3 cig/day  Vaping Use  . Vaping Use: Former  . Quit date: 02/01/2019  Substance and Sexual Activity  . Alcohol use: Not Currently    Comment:  weekends wine/liquor  . Drug use: Not Currently    Types: Marijuana    Comment: Last use - 11/17/19  . Sexual activity: Yes    Birth control/protection: Surgical    Comment: Tubal  Other Topics Concern  . Not on file  Social History Narrative  . Not on file   Social Determinants of Health   Financial Resource Strain: Not on file  Food Insecurity: Not on file  Transportation Needs: Not on file  Physical Activity: Not on file  Stress: Not on file  Social Connections: Not on file    Allergies:  Allergies  Allergen Reactions  . 5-Alpha Reductase Inhibitors   . Chloramphenicols   . Levofloxacin     Unknown reaction  . Penicillins Hives    No anaphylaxis  Did it involve swelling of the face/tongue/throat, SOB, or low BP? No Did it involve sudden or severe rash/hives, skin peeling, or any reaction on the inside of your mouth or nose? Yes Did you need to seek medical attention at a hospital or doctor's office? Yes When did it last happen?Childhood If all above answers are "NO", may proceed with cephalosporin use.   Marland Kitchen Phenergan [Promethazine Hcl]     Tremors, restless leg, akathesia 01-26-2018  . Seroquel  [Quetiapine]     Leg tremors     Metabolic Disorder Labs: Lab Results  Component Value Date   HGBA1C 5.1 07/04/2019   No results found for: PROLACTIN Lab Results  Component Value Date   CHOL 108 03/13/2018   TRIG 45 03/13/2018   HDL 35 (L) 03/13/2018   CHOLHDL 3.1 03/13/2018   [place] 64 03/13/2018   Lab Results  Component Value Date   TSH 1.170 03/13/2018    Therapeutic Level Labs: No results found for: LITHIUM No results found for: VALPROATE No components found for:  CBMZ  Current Medications: Current Outpatient Medications  Medication Sig Dispense Refill  . amphetamine-dextroamphetamine (ADDERALL XR) 25 MG 24 hr capsule Take 1 capsule by mouth every morning. 30 capsule 0  . [START ON 05/16/2020] amphetamine-dextroamphetamine (  ADDERALL XR) 25 MG 24 hr capsule Take 1 capsule by mouth every morning. 30 capsule 0  . [START ON 06/15/2020] amphetamine-dextroamphetamine (ADDERALL XR) 25 MG 24 hr capsule Take 1 capsule by mouth every morning. 30 capsule 0  . albuterol (VENTOLIN HFA) 108 (90 Base) MCG/ACT inhaler Inhale 2 puffs into the lungs every 6 (six) hours as needed for wheezing or shortness of breath.    . clonazePAM (KLONOPIN) 1 MG tablet TAKE 1 TABLET BY MOUTH TWICE DAILY AS NEEDED FOR ANXIETY 60 tablet 2  . diclofenac (VOLTAREN) 75 MG EC tablet Take 1 tablet (75 mg total) by mouth 2 (two) times daily. 20 tablet 0  . escitalopram (LEXAPRO) 20 MG tablet Take 1.5 tablets (30 mg total) by mouth daily. 45 tablet 2  . fluticasone (FLONASE) 50 MCG/ACT nasal spray Place 1 spray into both nostrils daily as needed for allergies or rhinitis.    Marland Kitchen ibuprofen (ADVIL) 200 MG tablet Take 200-400 mg by mouth every 6 (six) hours as needed for moderate pain or headache.    Derrill Memo ON 05/28/2020] lamoTRIgine (LAMICTAL) 150 MG tablet Take 1 tablet (150 mg total) by mouth 2 (two) times daily. 60 tablet 2  . methocarbamol (ROBAXIN) 500 MG tablet Take 500 mg by mouth every 8 (eight) hours as needed  for muscle spasms.    . ondansetron (ZOFRAN) 4 MG tablet Take 1 tablet (4 mg total) by mouth every 8 (eight) hours as needed. 30 tablet 1  . oxyCODONE (ROXICODONE) 5 MG immediate release tablet Take 1 tablet (5 mg total) by mouth every 4 (four) hours as needed for severe pain or breakthrough pain. 15 tablet 0  . zaleplon (SONATA) 10 MG capsule Take 1 capsule (10 mg total) by mouth at bedtime as needed for sleep. 30 capsule 2   No current facility-administered medications for this visit.   Facility-Administered Medications Ordered in Other Visits  Medication Dose Route Frequency Provider Last Rate Last Admin  . bupivacaine liposome (EXPAREL) 1.3 % injection    PRN Virl Cagey, MD   20 mL at 03/28/20 1233     Psychiatric Specialty Exam: Review of Systems  Musculoskeletal: Positive for neck pain.  Psychiatric/Behavioral: Positive for decreased concentration and sleep disturbance. The patient is nervous/anxious.   All other systems reviewed and are negative.   Last menstrual period 03/17/2020.There is no height or weight on file to calculate BMI.  General Appearance: NA  Eye Contact:  NA  Speech:  Clear and Coherent  Volume:  Normal  Mood:  Anxious  Affect:  NA  Thought Process:  Goal Directed  Orientation:  Full (Time, Place, and Person)  Thought Content: Rumination   Suicidal Thoughts:  No  Homicidal Thoughts:  No  Memory:  Immediate;   Fair Recent;   Fair Remote;   Fair  Judgement:  Fair  Insight:  Fair  Psychomotor Activity:  NA  Concentration:  Concentration: Fair  Recall:  [employer] of Knowledge: Fair  Language: Good  Akathisia:  Negative  Handed:  Right  AIMS (if indicated): not done  Assets:  Communication Skills Desire for Improvement Housing Resilience  ADL's:  Intact  Cognition: WNL  Sleep:  Fair   Screenings: GAD-7   [facility] Office Visit from 08/03/2019 in [facility] Office Visit from 07/04/2019 in [facility] Office Visit from 02/20/2018 in Primary [facility] at [facility]  Total GAD-7 Score 20 20 18  [facility] Office Visit from 01/28/2020 in [facility] Office Visit from 08/03/2019 in [facility] Office Visit from 07/04/2019 in [facility] Office Visit from 02/20/2018 in Primary Care at [facility] Visit from 05/06/2017 in [facility]  PHQ-2 Total Score 4 2 4 3 2   PHQ-9 Total Score 18 11 20 16 12     Flowsheet Row ED from 04/10/2020 in [facility] Admission (Discharged) from 03/28/2020 in [facility] from 03/26/2020 in [facility] CATEGORY No Risk No Risk No Risk       Assessment and Plan: 24yo DWFwithbipolar disorder and ADHD(the latter diagnosedwhen she was 22-13 years old). Unfortunately she does not remember medications she has been tried for either of these conditions just says that "there were many". She seems to recall names of Ritalin and Adderall. Available record also shows that she was tried on SSRIs (citalopram, fluoxetine), aripiprazole, oxcarbazepine, hydroxyzine, clonazepam. She describes her mood swings as being very rapid - happening several times during the day. WestartedLamictal titration and escitalopram 10 mg daily. Initially she had muscle cramps and some sedation but both have resolved.She also complainedof difficulty with falling asleep - trazodone has not been very effective for that (150 mg)andregularAmbien only helpedher fall asleep but she is up after few hours.I have ordered Ambien CRbut it was not approved by her insurance.She also failedtemazepam, eszopiclone, mirtazapineand quetiapine (developed muscle twitching on it).We have added Adderall XR 20 mgandwhile she foundit helpful for concentrationis was  notyet optimal. We increased dose to 30 mg and she reacted with increased anxiety(thesame happened when we tried Concerta 27 mg) but tolerates 25 mg well.She remains anxious, "stressed out", more so since her application for disability was denied (2nd time). I have written her a letter in support of her efforts to get disability income. Clonazepam for anxiety is effective but she needs to use it daily.Sleep has improved after addition of Sonata.   Dx: Bipolar 2ultra rapid cycling; GAD; ADHD  Plan:Continueescitalopram 30mg  for anxiety,Lamictal 150 mg bid, Adderall XR25 mg, Sonata 10 mg prn insomniaandclonazepam1mg to tid prn anxiety.Next appointment intwo months with a new provider.The plan was discussed with patient who had an opportunity to ask questions and these were all answered.The plan was discussed with patient who had an opportunity to ask questions and these were all answered. I spend 15 minutes in phone consultation with the patient.   Stephanie Acre, MD 04/16/2020, 9:13 AM

## 2020-04-18 ENCOUNTER — Other Ambulatory Visit: Payer: Self-pay | Admitting: Orthopedic Surgery

## 2020-04-18 DIAGNOSIS — M5412 Radiculopathy, cervical region: Secondary | ICD-10-CM | POA: Diagnosis not present

## 2020-04-18 DIAGNOSIS — M542 Cervicalgia: Secondary | ICD-10-CM

## 2020-04-23 ENCOUNTER — Encounter: Payer: Self-pay | Admitting: Nurse Practitioner

## 2020-04-24 ENCOUNTER — Ambulatory Visit: Payer: Medicaid Other | Admitting: General Surgery

## 2020-04-26 ENCOUNTER — Encounter: Payer: Self-pay | Admitting: Nurse Practitioner

## 2020-04-26 ENCOUNTER — Other Ambulatory Visit: Payer: Self-pay | Admitting: Nurse Practitioner

## 2020-04-26 MED ORDER — BUTALBITAL-APAP-CAFFEINE 50-325-40 MG PO TABS
1.0000 | ORAL_TABLET | Freq: Four times a day (QID) | ORAL | 0 refills | Status: AC | PRN
Start: 2020-04-26 — End: 2020-05-26

## 2020-05-02 ENCOUNTER — Other Ambulatory Visit: Payer: Self-pay | Admitting: Nurse Practitioner

## 2020-05-02 DIAGNOSIS — M25511 Pain in right shoulder: Secondary | ICD-10-CM | POA: Diagnosis not present

## 2020-05-02 DIAGNOSIS — E669 Obesity, unspecified: Secondary | ICD-10-CM

## 2020-05-02 DIAGNOSIS — M25512 Pain in left shoulder: Secondary | ICD-10-CM | POA: Diagnosis not present

## 2020-05-06 ENCOUNTER — Encounter: Payer: Self-pay | Admitting: General Surgery

## 2020-05-06 ENCOUNTER — Ambulatory Visit (INDEPENDENT_AMBULATORY_CARE_PROVIDER_SITE_OTHER): Payer: Medicaid Other | Admitting: General Surgery

## 2020-05-06 ENCOUNTER — Other Ambulatory Visit: Payer: Self-pay

## 2020-05-06 VITALS — BP 109/66 | HR 90 | Temp 98.4°F | Resp 14 | Ht 62.0 in | Wt 206.0 lb

## 2020-05-06 DIAGNOSIS — K429 Umbilical hernia without obstruction or gangrene: Secondary | ICD-10-CM

## 2020-05-06 NOTE — Patient Instructions (Signed)
Diet and activity as tolerated. You are 6 weeks out Friday. Ok to start lifting.

## 2020-05-06 NOTE — Progress Notes (Signed)
[facility] Note   HPI:  25 y.o. Female presents to clinic for post-op follow-up evaluation after an umbilical hernia repair done primarily. Patient reports some tenderness to the right and above the repair. She is worried she has another hernia.   Review of Systems:  No fever or chills No drainage All other review of systems: otherwise negative   Vital Signs:  BP 109/66   Pulse 90   Temp 98.4 F (36.9 C) (Other (Comment))   Resp 14   Ht 5\' 2"  (1.575 m)   Wt 206 lb (93.4 kg)   SpO2 96%   BMI 37.68 kg/m    Physical Exam:  Physical Exam Vitals reviewed.  Cardiovascular:     Rate and Rhythm: Normal rate.  Pulmonary:     Effort: Pulmonary effort is normal.  Abdominal:     General: There is no distension.     Palpations: Abdomen is soft.     Tenderness: There is no abdominal tenderness.     Hernia: No hernia is present.     Comments: Healing incision, no erythema or drainage, supraumbilical region to the right of midline bruising, no obvious hernia defect, tender      Assessment:  25 y.o. yo Female s/p umbilical hernia repair. Doing well from the hernia but is worried there is another one. She has some bruising in the area. Potentially this is just from scarring in from the repair/ muscle pulling.   Plan:  Monitor the pain in the right side above umbilicus  Diet and activity as tolerated. You are 6 weeks out Friday. Ok to start lifting.  Call if something changes or we need to investigate the area further, may need more imaging   PRN follow up   Curlene Labrum, [place] [address] [place], [place] [id number] [phone] (office)

## 2020-05-16 ENCOUNTER — Other Ambulatory Visit: Payer: Medicaid Other

## 2020-05-21 ENCOUNTER — Encounter: Payer: Self-pay | Admitting: Nurse Practitioner

## 2020-05-23 ENCOUNTER — Other Ambulatory Visit: Payer: Self-pay

## 2020-05-23 ENCOUNTER — Ambulatory Visit
Admission: RE | Admit: 2020-05-23 | Discharge: 2020-05-23 | Disposition: A | Discharge status: Home / Self Care | Payer: Medicaid Other | Source: Ambulatory Visit | Attending: Orthopedic Surgery | Admitting: Orthopedic Surgery

## 2020-05-23 DIAGNOSIS — M2578 Osteophyte, vertebrae: Secondary | ICD-10-CM | POA: Diagnosis not present

## 2020-05-23 DIAGNOSIS — M542 Cervicalgia: Secondary | ICD-10-CM

## 2020-05-23 IMAGING — MR MR CERVICAL SPINE W/O CM
5 of 6 series · 31 of 48 positions shown · non-contrast
Comparison: Cervical radiographs [DATE] without report.

CLINICAL DATA: Neck pain.

EXAM:
MRI CERVICAL SPINE WITHOUT CONTRAST
TECHNIQUE: Multiplanar, multisequence MR imaging of the cervical spine was
performed. No intravenous contrast was administered.

[Series 2: T2 · sagittal · 3.0mm · 0.41mm/px · 7 of 21 slices shown (1 of 3)]
[im 1/21]
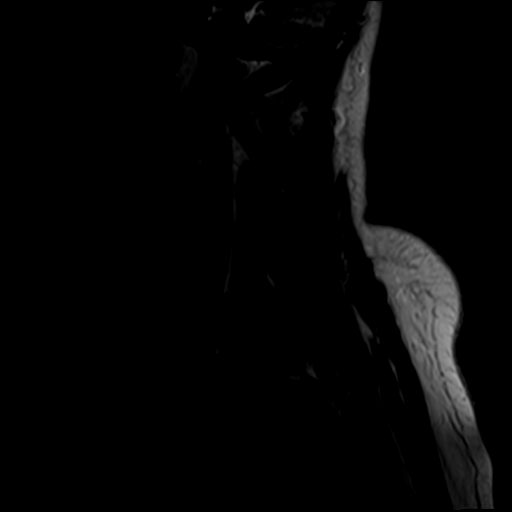
[im 4/21]
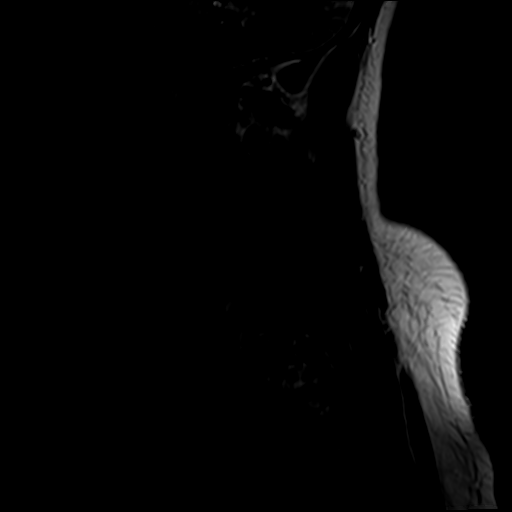
[im 7/21]
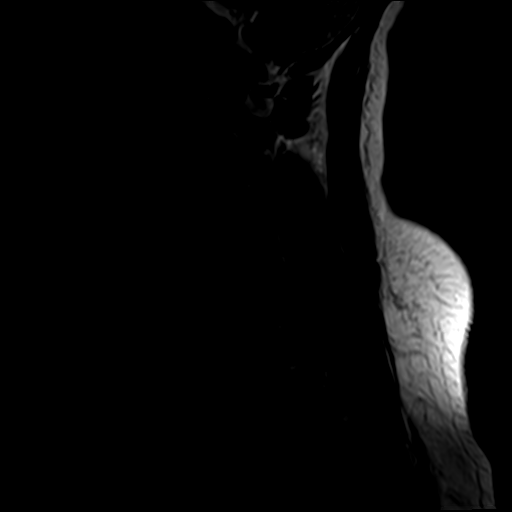
[im 11/21]
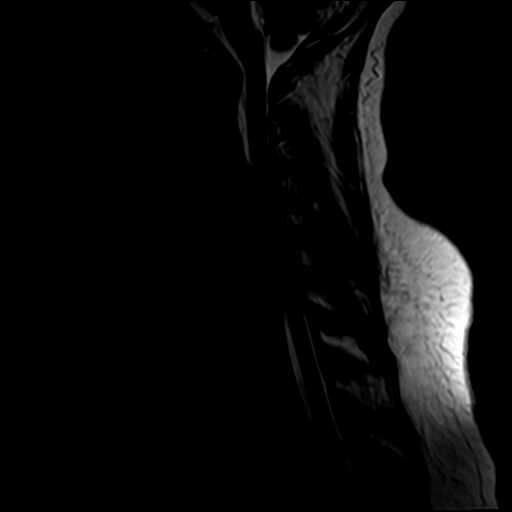
[im 14/21]
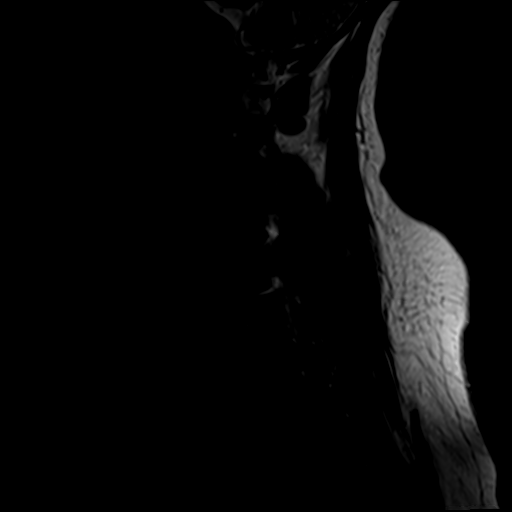
[im 17/21]
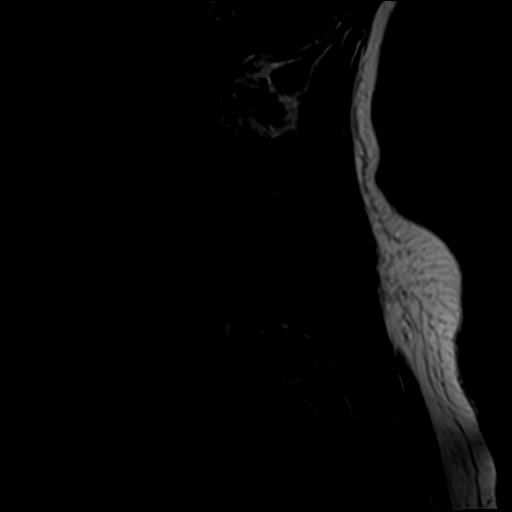
[im 21/21]
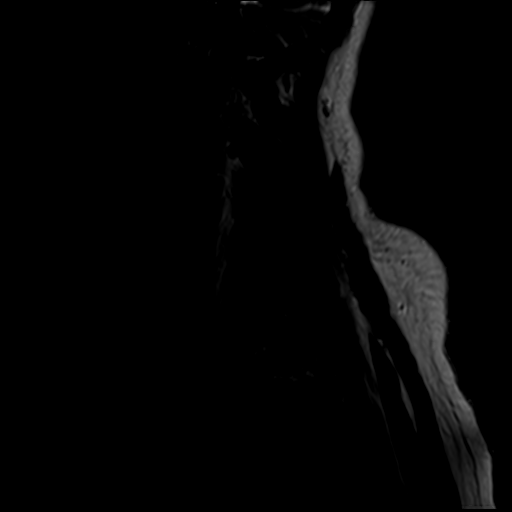

[Series 3: STIR · sagittal · 3.0mm · 0.82mm/px · 1 of 21 slices shown]
[im 1/21]
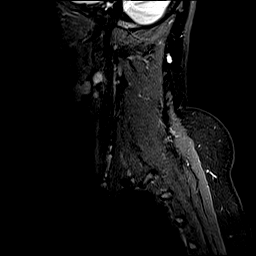

[Series 4: T1 · sagittal · 3.0mm · 0.82mm/px · 6 of 21 slices shown]
[im 1/21]
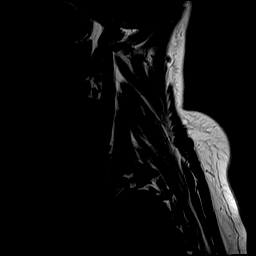
[im 5/21]
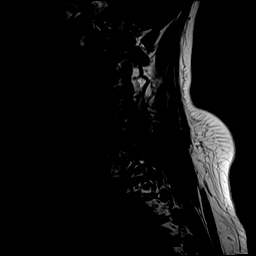
[im 9/21]
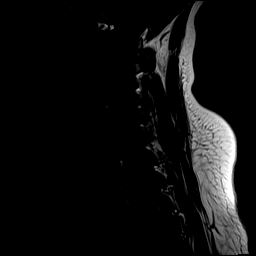
[im 13/21]
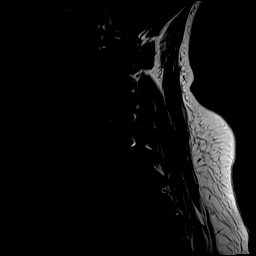
[im 17/21]
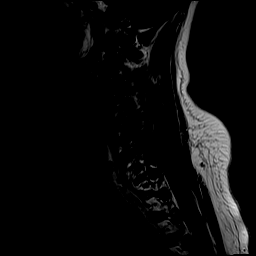
[im 21/21]
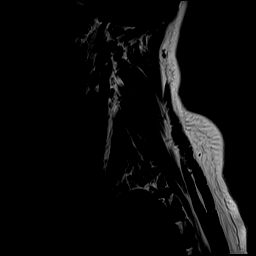

[Series 5: T2 · axial · 3.0mm · 0.70mm/px · z∈[-100,+35]mm · 11 of 37 slices shown (2 of 3)]
[im 1/37]
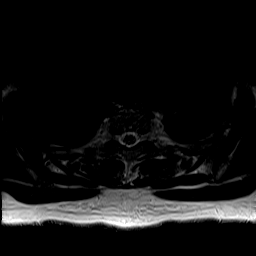
[im 4/37]
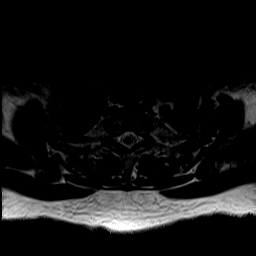
[im 8/37]
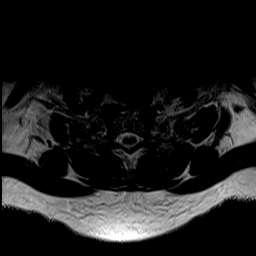
[im 11/37]
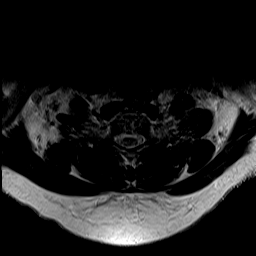
[im 15/37]
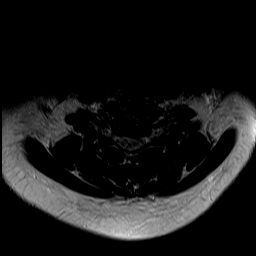
[im 19/37]
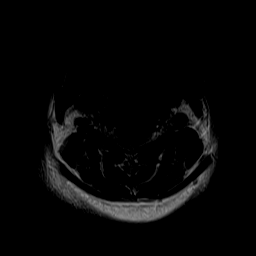
[im 22/37]
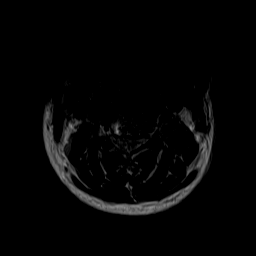
[im 26/37]
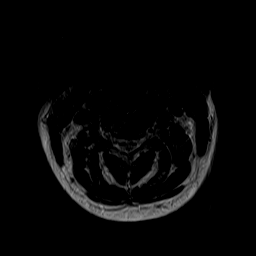
[im 29/37]
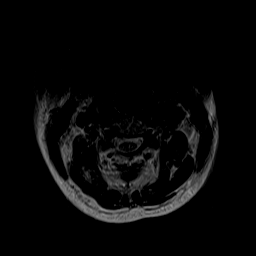
[im 33/37]
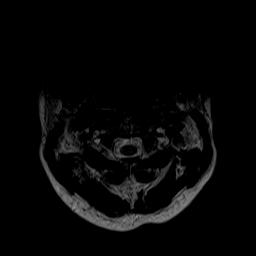
[im 37/37]
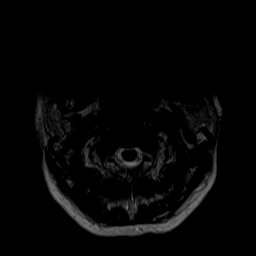

[Series 7: T2 · sagittal · 3.0mm · 0.41mm/px · 6 of 21 slices shown (3 of 3)]
[im 1/21]
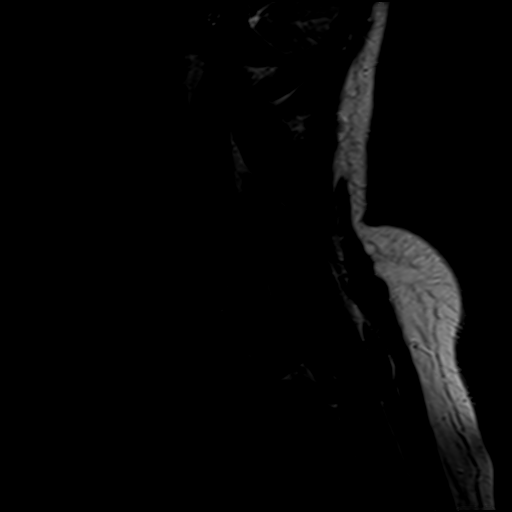
[im 5/21]
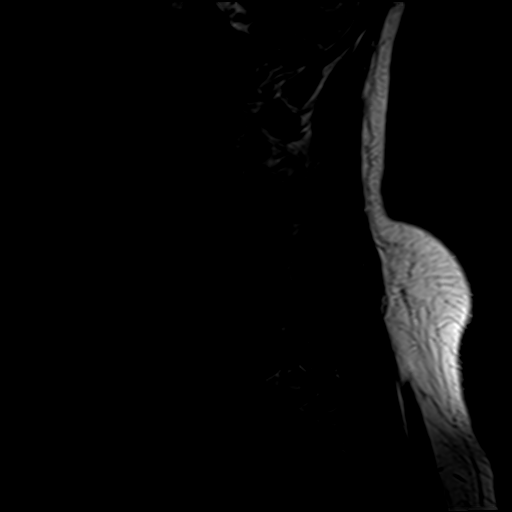
[im 9/21]
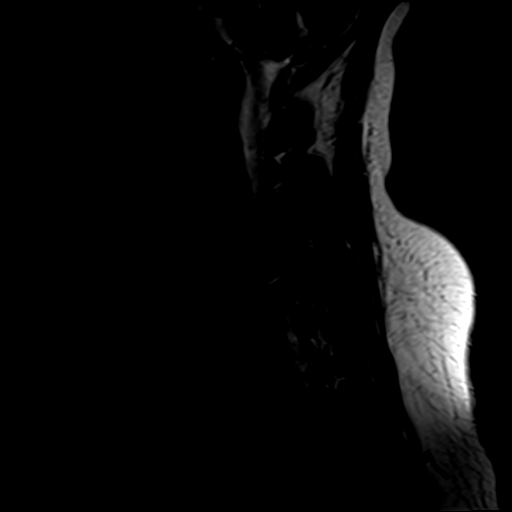
[im 13/21]
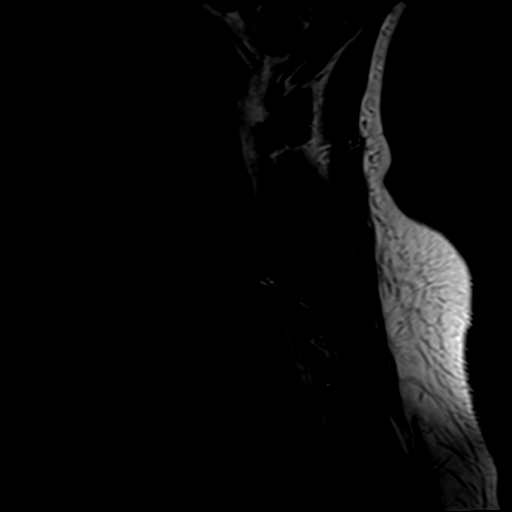
[im 17/21]
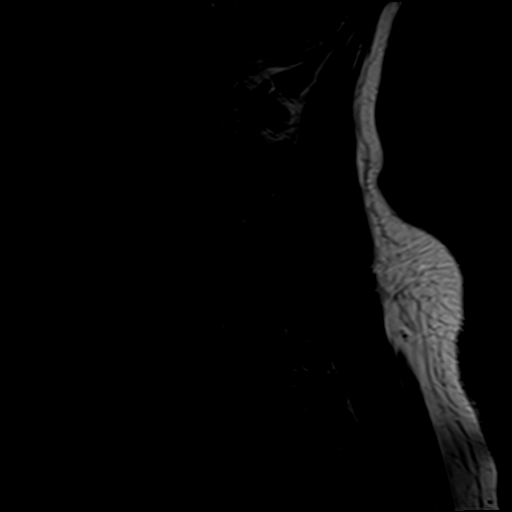
[im 21/21]
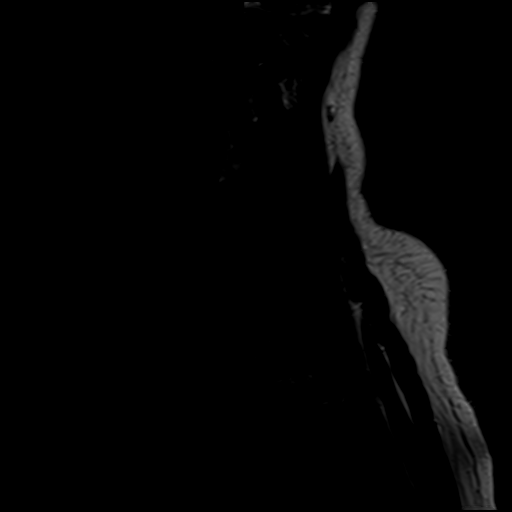

[31 of 48 positions shown; findings below may reference images not displayed]

FINDINGS: Motion limited evaluation.  Within this limitation:

Alignment: No substantial sagittal subluxation. Straightening of the
normal cervical lordosis.

Vertebrae: C5-C6 arthroplasty. C5 and C6 levels are largely obscured
by associated metallic artifact. No visible evidence of acute
fracture, discitis/osteomyelitis, or suspicious bone lesion at the
other levels.

Cord: Limited assessment due to motion without overt cord signal
abnormality.

Posterior Fossa, vertebral arteries, paraspinal tissues: Mild
prominence of the prevertebral soft tissues in the mid to lower
spine, favored chronic given similar mild prominence on prior
radiographs and likely relates to the esophagus. No convincing
prevertebral edema. Visualized vertebral artery flow voids are
maintained. Inferior aspect of the posterior fossa is unremarkable
on limited sagittal assessment.

Disc levels:

C2-C3: No significant disc protrusion, foraminal stenosis, or canal
stenosis.

C3-C4: Small posterior disc osteophyte complex without significant
canal or foraminal stenosis.

C4-C5: Limited evaluation due to motion. Suspected left eccentric
posterior disc osteophyte complex and left uncovertebral
hypertrophy. Possible mild canal and mild left foraminal stenosis

C5-C6: Nondiagnostic evaluation due to metallic artifact and motion.

C6-C7: Limited evaluation due to motion. Disc desiccation. Suspected
left eccentric posterior disc osteophyte complex and left
uncovertebral hypertrophy. No evidence of significant canal or
foraminal stenosis.

C7-T1: No significant disc protrusion, foraminal stenosis, or canal
stenosis.
IMPRESSION: C5-C6 arthroplasty with nondiagnostic evaluation at this level due
to metallic artifact and motion. Evaluation at the other levels is
significantly limited by motion without visible compressive
stenosis. Query mild adjacent level degenerative change at C4-C5
with possible mild canal and mild left foraminal stenosis. If
clinically indicated, a CT myelogram or repeat MRI (possibly with
sedation) may better evaluate.

## 2020-05-27 ENCOUNTER — Encounter: Payer: Self-pay | Admitting: Nurse Practitioner

## 2020-05-29 ENCOUNTER — Encounter: Payer: Self-pay | Admitting: Nurse Practitioner

## 2020-05-29 ENCOUNTER — Telehealth: Payer: Self-pay | Admitting: Nurse Practitioner

## 2020-05-29 ENCOUNTER — Other Ambulatory Visit: Payer: Self-pay | Admitting: Nurse Practitioner

## 2020-05-29 MED ORDER — SUMATRIPTAN SUCCINATE 50 MG PO TABS: 50.0000 mg | ORAL_TABLET | ORAL | 0 refills | Status: DC | PRN

## 2020-05-29 NOTE — Telephone Encounter (Signed)
Will forward to provider  

## 2020-05-29 NOTE — Telephone Encounter (Signed)
Copied from [facility] [phone]. Topic: General - Other >> May 29, 2020  8:37 AM Leward Quan A wrote: Reason for CRM: Patient called in asking Kellie Martinez nurse to please give her a call back as soon as possible say that she need to discuss the medication that she is taking for her headaches. Say that she tried every thing but nothing works. Say that she will have to get neck surgery to possibly correct this issue she will know on 06/03/20. Please call her at Ph# [phone]

## 2020-06-03 ENCOUNTER — Other Ambulatory Visit: Payer: Medicaid Other

## 2020-06-03 ENCOUNTER — Other Ambulatory Visit: Payer: Self-pay | Admitting: Orthopedic Surgery

## 2020-06-03 DIAGNOSIS — G959 Disease of spinal cord, unspecified: Secondary | ICD-10-CM | POA: Diagnosis not present

## 2020-06-04 ENCOUNTER — Encounter: Payer: Self-pay | Admitting: Specialist

## 2020-06-04 ENCOUNTER — Telehealth: Payer: Self-pay

## 2020-06-04 NOTE — Progress Notes (Signed)
Phone call to patient to verify medication list and allergies for myelogram procedure. Pt instructed to hold Adderall for 48hrs prior to myelogram appointment time and 24 hours after appointment. Pt reports she is no longer taking the Lexapro or Imitrex. Advised pt if she were to restart taking these medications she would need to stop them for 48 hours before and 24 hours after as well. Pt also instructed to have a driver the day of the procedure, the procedure would take around 2 hours, and discharge instructions discussed. Pt verbalized understanding.

## 2020-06-09 ENCOUNTER — Other Ambulatory Visit: Payer: Self-pay | Admitting: Orthopedic Surgery

## 2020-06-09 DIAGNOSIS — G959 Disease of spinal cord, unspecified: Secondary | ICD-10-CM

## 2020-06-11 ENCOUNTER — Other Ambulatory Visit: Payer: Medicaid Other

## 2020-06-23 ENCOUNTER — Telehealth (HOSPITAL_COMMUNITY): Payer: Self-pay

## 2020-06-23 NOTE — Telephone Encounter (Signed)
We've received faxes from [employer] for this patient's medication. She was with Dr. Mamie Nick. & does not have a future appointment with a new provider nor has she called to make an appointment. I notified her pharmacy that her dr retired & is no longer with our practice. I also let them know that if she contacts them she would need to call our office to make an appointment with a new provider in order to get her medications refilled

## 2020-06-24 ENCOUNTER — Ambulatory Visit
Admission: RE | Admit: 2020-06-24 | Discharge: 2020-06-24 | Disposition: A | Discharge status: Home / Self Care | Payer: Medicaid Other | Source: Ambulatory Visit | Attending: Orthopedic Surgery | Admitting: Orthopedic Surgery

## 2020-06-24 DIAGNOSIS — G959 Disease of spinal cord, unspecified: Secondary | ICD-10-CM

## 2020-06-24 DIAGNOSIS — M4802 Spinal stenosis, cervical region: Secondary | ICD-10-CM | POA: Diagnosis not present

## 2020-06-24 DIAGNOSIS — M5021 Other cervical disc displacement,  high cervical region: Secondary | ICD-10-CM | POA: Diagnosis not present

## 2020-06-24 DIAGNOSIS — M542 Cervicalgia: Secondary | ICD-10-CM | POA: Diagnosis not present

## 2020-06-24 IMAGING — XA DG MYELOGRAPHY LUMBAR INJ CERVICAL
11 series · 11 of 11 positions shown · non-contrast
Comparison: None

CLINICAL DATA: 24-year-old female with neck pain and bilateral arm
numbness, history of prior C5-C6 disc arthroplasty.

[Series 1: vasc standard · 1 of 1 slices shown (1 of 9)]
[im 1/1]
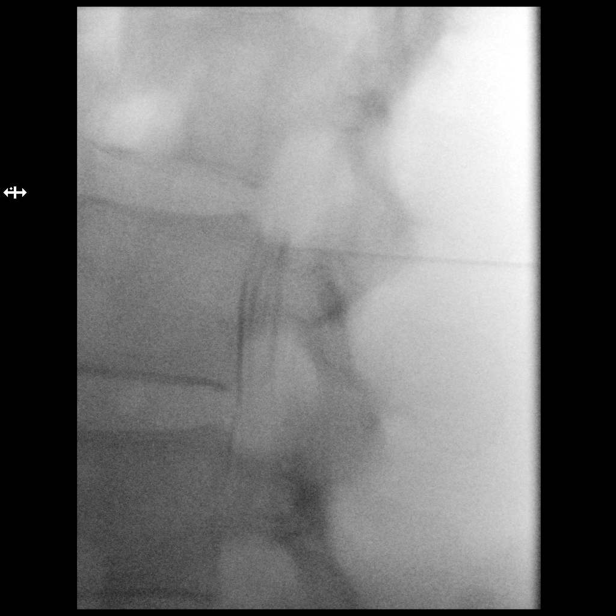

[Series 1: w cervical spine lat · 0.15mm/px · 1 of 1 slices shown]
[im 1/1]
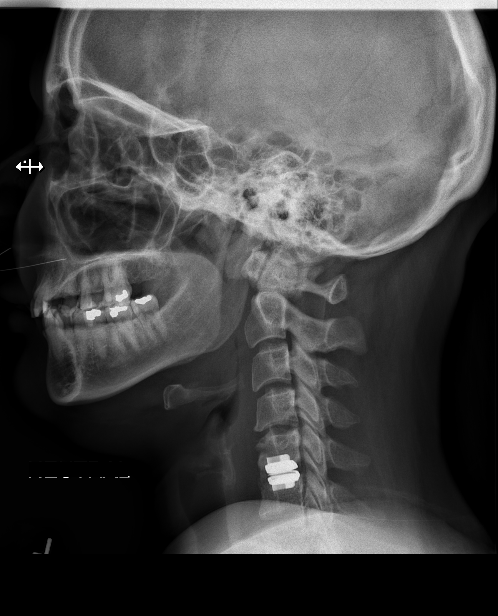

[Series 2: vasc standard · 1 of 1 slices shown (2 of 9)]
[im 1/1]
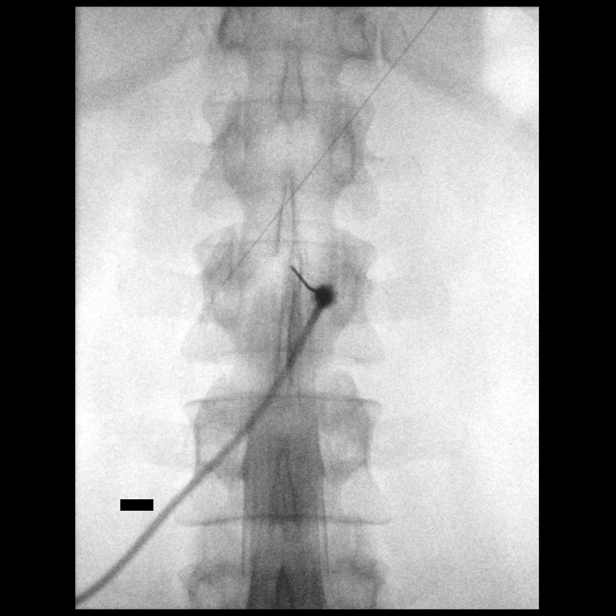

[Series 3: vasc standard · 1 of 1 slices shown (3 of 9)]
[im 1/1]
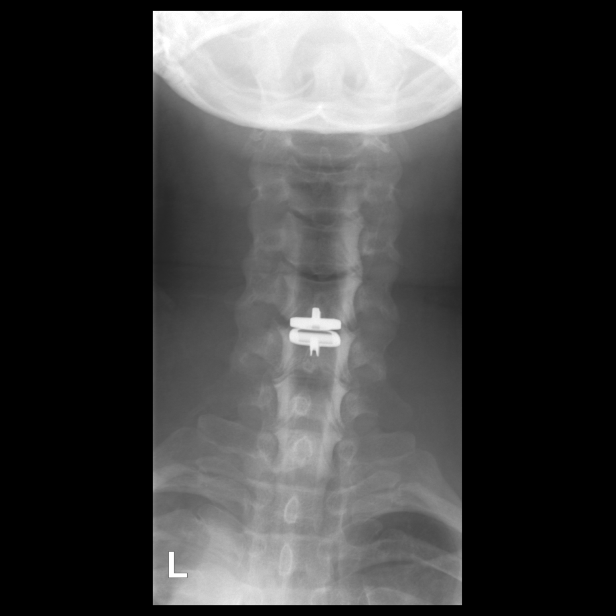

[Series 3: w cervical spine extension · 0.15mm/px · 1 of 1 slices shown]
[im 1/1]
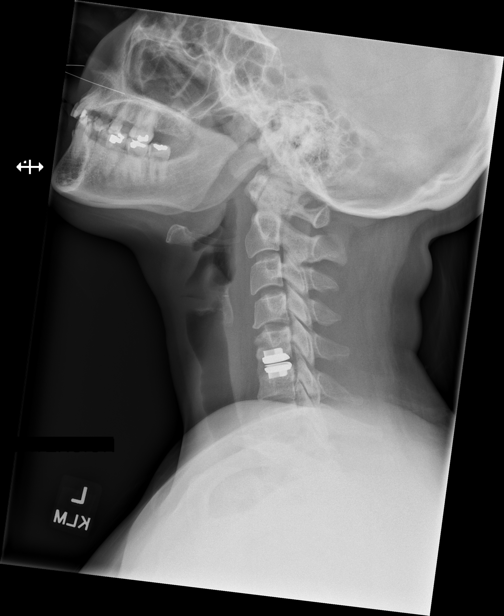

[Series 4: vasc standard · 1 of 1 slices shown (4 of 9)]
[im 1/1]
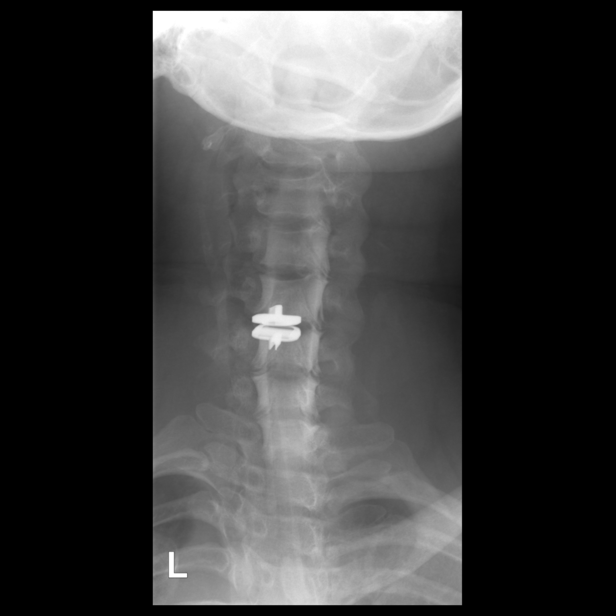

[Series 5: vasc standard · 1 of 1 slices shown (5 of 9)]
[im 1/1]
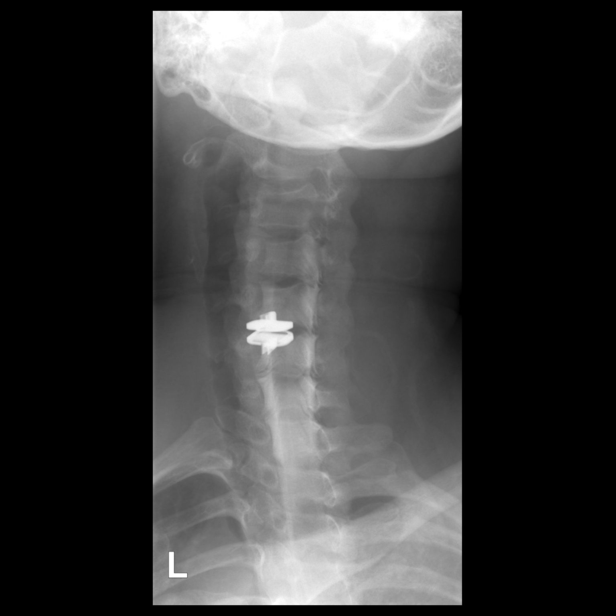

[Series 6: vasc standard · 1 of 1 slices shown (6 of 9)]
[im 1/1]
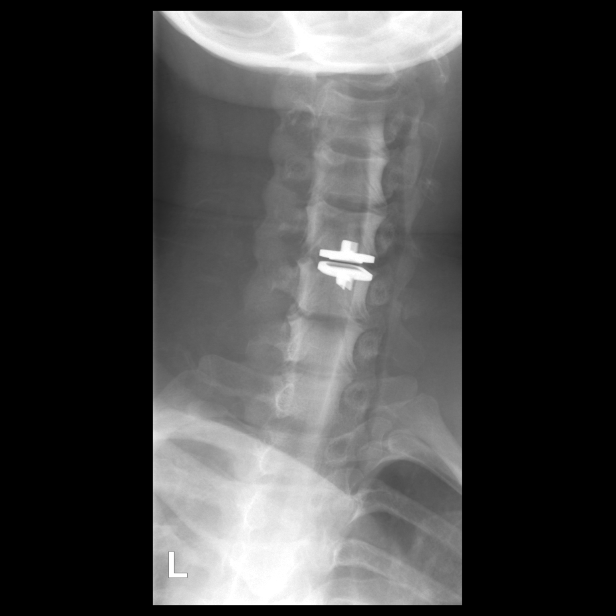

[Series 7: vasc standard · 1 of 1 slices shown (7 of 9)]
[im 1/1]
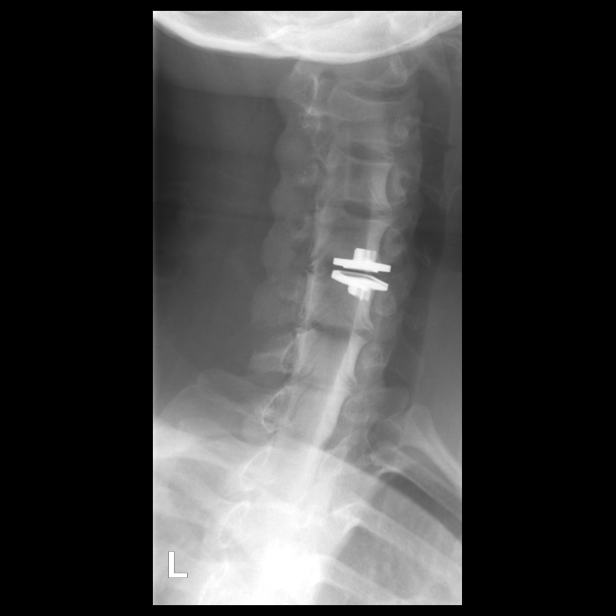

[Series 8: vasc standard · 1 of 1 slices shown (8 of 9)]
[im 1/1]
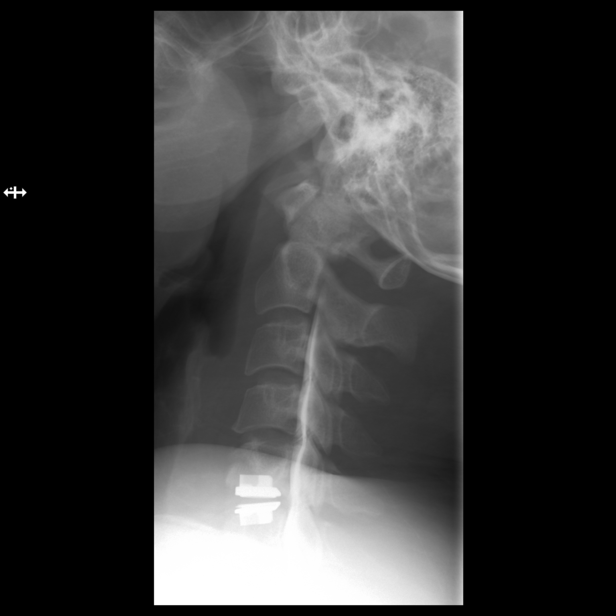

[Series 10: vasc standard · 1 of 1 slices shown (9 of 9)]
[im 1/1]
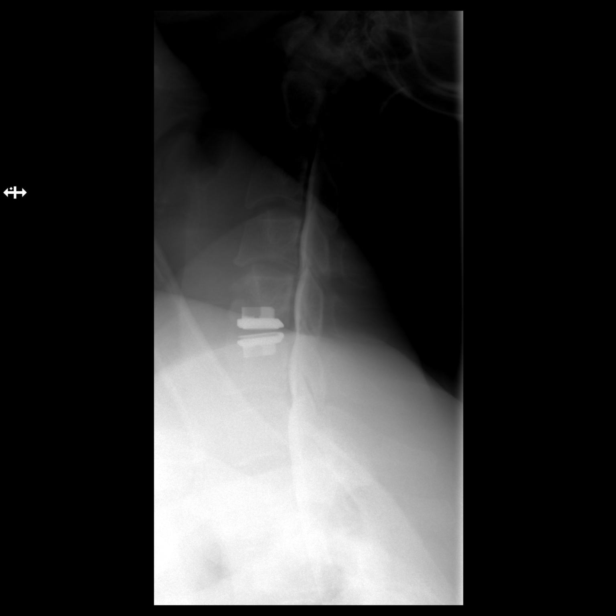

[11 of 11 positions shown; findings below may reference images not displayed]

EXAM:
CERVICAL MYELOGRAM

FLUOROSCOPY TIME:  Fifty-two seconds, 8.9 mGy

PROCEDURE:
After thorough discussion of risks and benefits of the procedure
including bleeding, infection, injury to nerves, blood vessels,
adjacent structures as well as headache and CSF leak, written and
oral informed consent was obtained. Consent was obtained by Dr.
DAU. Time out form was completed.

Patient was positioned prone on the fluoroscopy table. Local
anesthesia was provided with 1% lidocaine without epinephrine after
prepped and draped in the usual sterile fashion. Puncture was
performed at L2-L3 using a 3 1/2 inch 22-gauge spinal needle via
right paramedian approach. Using a single pass through the dura, the
needle was placed within the thecal sac, with return of clear CSF.
10 mL of Isovue [Z4] was injected into the thecal sac, with normal
opacification of the nerve roots and cauda equina consistent with
free flow within the subarachnoid space.

I personally performed the lumbar puncture and administered the
intrathecal contrast. I also personally supervised acquisition of
the myelogram images.
FINDINGS: Technically successful lumbar puncture and injection of intrathecal
contrast. Radiographs demonstrate appropriate opacification of the
cervical intrathecal sac. Normal alignment. No abnormal translation
on flexion or extension views. Postsurgical changes after C5-C6 disc
repair.
IMPRESSION: Technically successful lumbar puncture at L2-L3 with a right
paramedian approach and injection of intrathecal contrast for
cervical myelogram. Please refer the dedicated CT cervical spine
from the same day for level by level myelographic findings.

## 2020-06-24 IMAGING — CT CT CERVICAL SPINE W/ CM
2 series · 10 of 14 positions shown, 12 images · IV contrast (isovue)
Comparison: Cervical spine MRI [DATE]

CLINICAL DATA: Neck pain and bilateral arm numbness. Prior C5-6
disc arthroplasty.

EXAM:
CT MYELOGRAPHY CERVICAL SPINE
TECHNIQUE: CT imaging of the cervical spine was performed after Isovue-M 300
contrast administration. Multiplanar CT image reconstructions were
also generated.

[Series 2: cspine soft · axial · 0.31mm/px · z∈[+1031,+1169]mm · 5 of 105 slices shown, 7 images]
[im 18/105  soft-tissue]
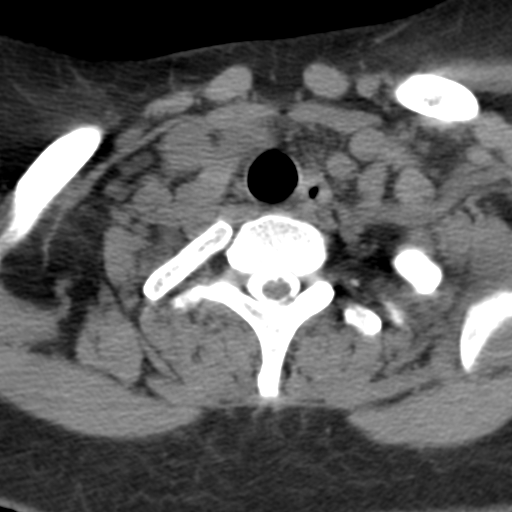
[im 18/105  bone]
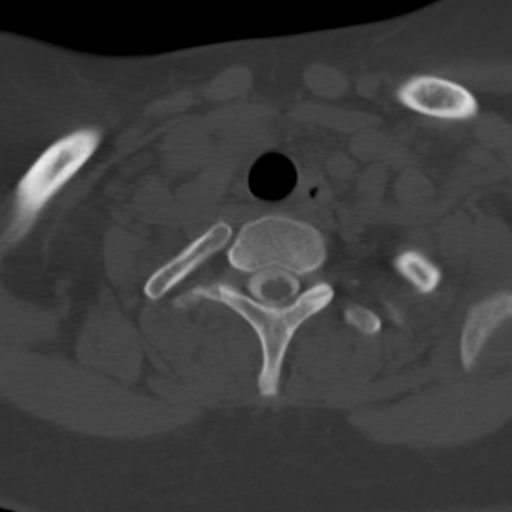
[im 35/105  bone]
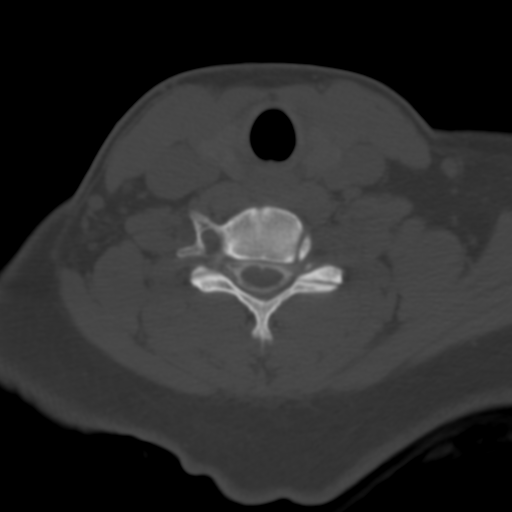
[im 53/105  bone]
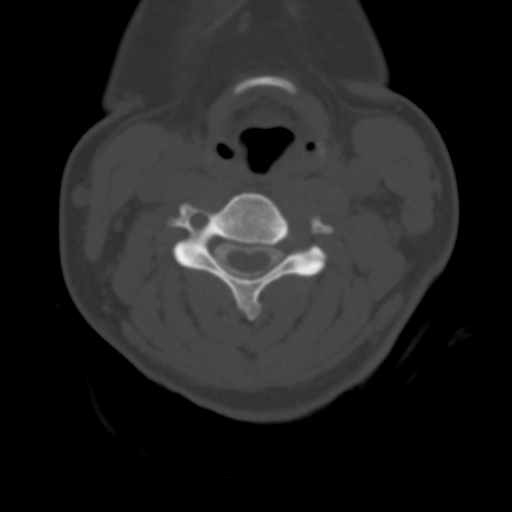
[im 70/105  bone]
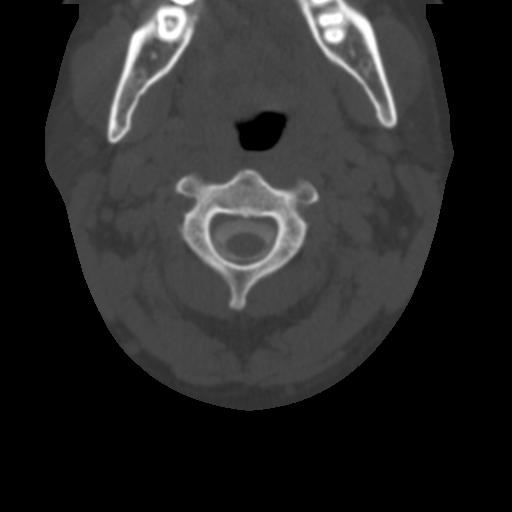
[im 87/105  soft-tissue]
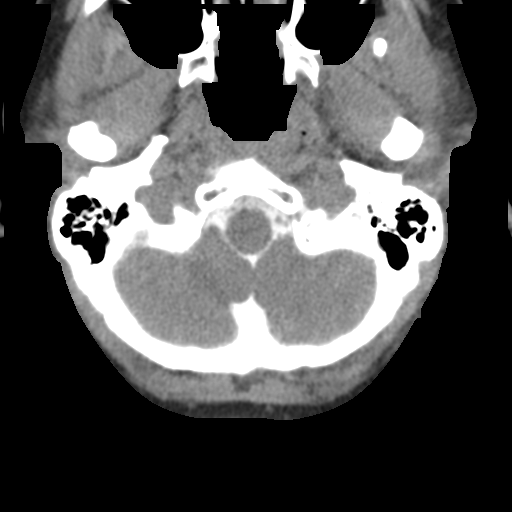
[im 87/105  bone]
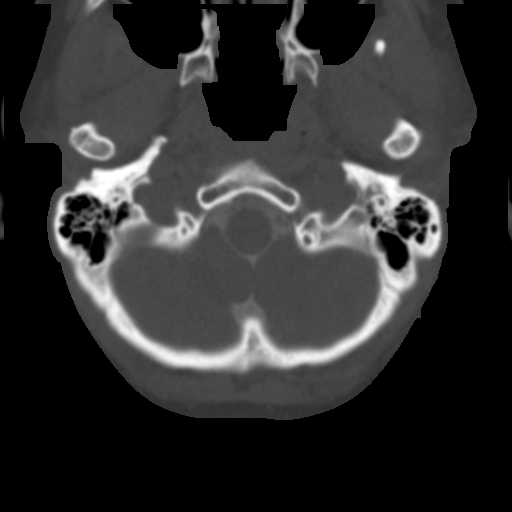

[Series 8: angled axial soft · axial · 0.29mm/px · z∈[+1018,+1154]mm · 5 of 106 slices shown]
[im 18/106  soft-tissue]
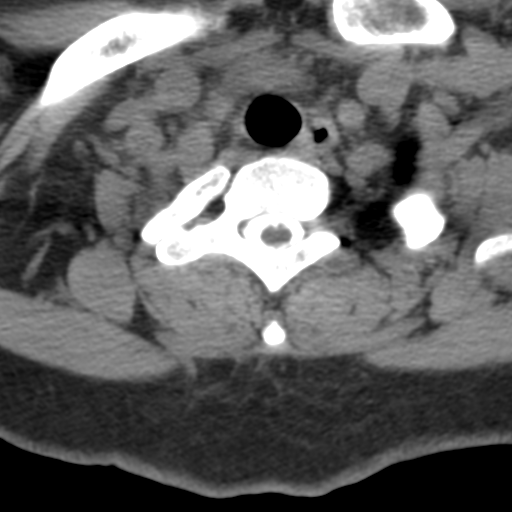
[im 36/106  soft-tissue]
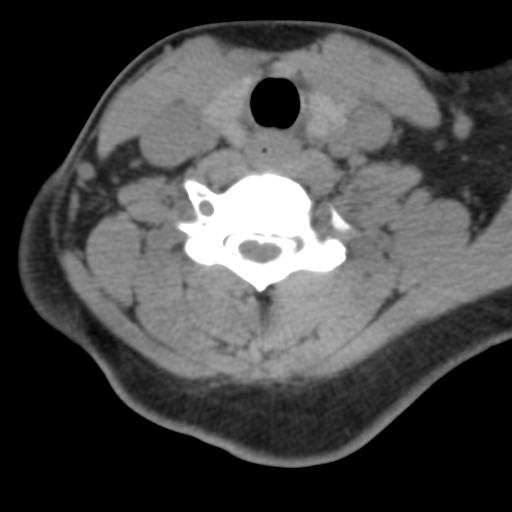
[im 53/106  soft-tissue]
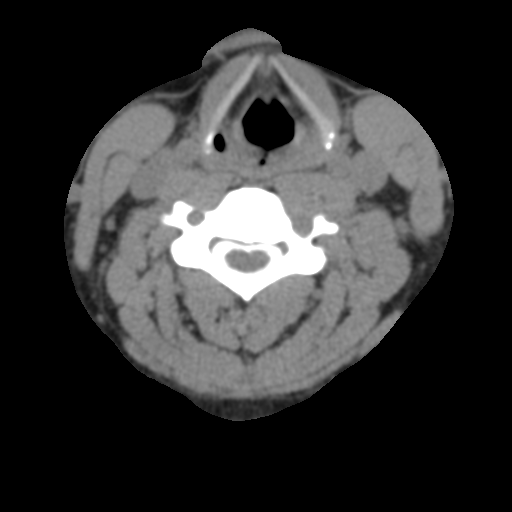
[im 71/106  soft-tissue]
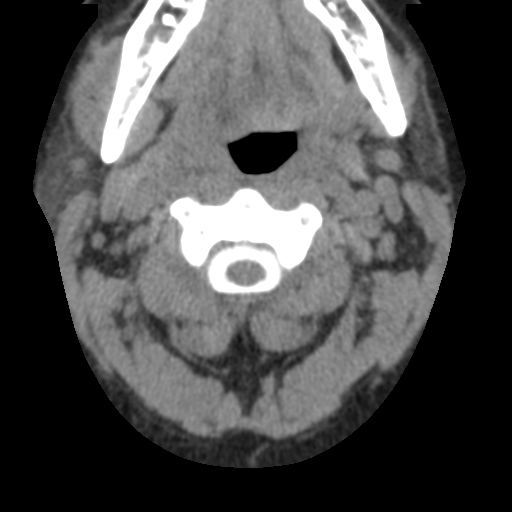
[im 88/106  soft-tissue]
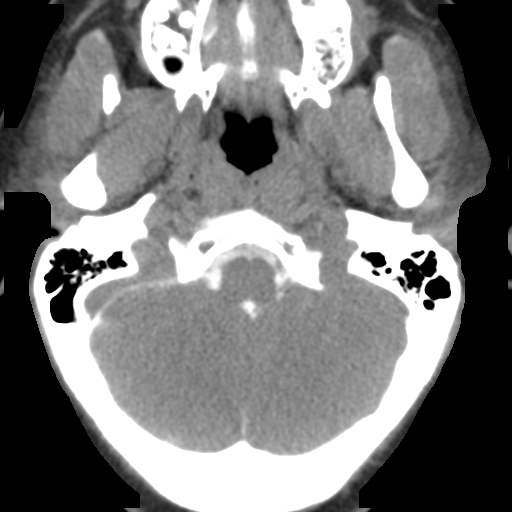

[10 of 14 positions shown; findings below may reference images not displayed]

FINDINGS: Lumbar puncture for intrathecal contrast administration and
acquisition of conventional myelogram images were performed by Dr.
EBINE and reported separately.

Alignment: Mild reversal of normal cervical lordosis.  No listhesis.

Vertebrae: No fracture or suspicious osseous lesion.

Cord: Mild ventral cord flattening at C4-5 due to spondylosis.

Posterior Fossa and paraspinal tissues: Unremarkable.

Disc levels:

C2-3: Negative.

C3-4: Tiny central disc protrusion without significant stenosis.

C4-5: Broad-based posterior disc osteophyte complex results in mild
spinal stenosis without significant neural foraminal stenosis.

C5-6: Prior disc arthroplasty with associated streak artifact which
partially obscures the right neural foramen. No evidence of
significant residual stenosis.

C6-7: Minimal disc bulging and endplate spurring without significant
stenosis.

C7-T1: Negative.
IMPRESSION: 1. C5-6 disc arthroplasty without significant residual stenosis.
2. Mild spinal stenosis at C4-5.

## 2020-06-24 MED ORDER — IOPAMIDOL (ISOVUE-M 300) INJECTION 61%
10.0000 mL | Freq: Once | INTRAMUSCULAR | Status: AC | PRN
  Administered 2020-06-24: 10 mL via INTRATHECAL

## 2020-06-24 MED ORDER — DIAZEPAM 5 MG PO TABS
10.0000 mg | ORAL_TABLET | Freq: Once | ORAL | Status: AC
  Administered 2020-06-24: 10 mg via ORAL

## 2020-06-24 MED ORDER — ONDANSETRON HCL 4 MG/2ML IJ SOLN
4.0000 mg | Freq: Once | INTRAMUSCULAR | Status: AC
  Administered 2020-06-24: 4 mg via INTRAMUSCULAR

## 2020-06-24 MED ORDER — MEPERIDINE HCL 50 MG/ML IJ SOLN
50.0000 mg | Freq: Once | INTRAMUSCULAR | Status: AC
  Administered 2020-06-24: 50 mg via INTRAMUSCULAR

## 2020-06-24 NOTE — Progress Notes (Signed)
Pt reports she has been off of her Adderall for at least 48 hours.

## 2020-06-24 NOTE — Progress Notes (Signed)
Pt requested to resume her Adderall today after procedure. Per Dr. Serafina Royals, she can resume her Adderall today. Pt aware.

## 2020-06-24 NOTE — Discharge Instructions (Signed)
Myelogram Discharge Instructions  1. Go home and rest quietly as needed. You may resume normal activities; however, do not exert yourself strongly or do any heavy lifting today and tomorrow.   2. DO NOT drive today.    3. You may resume your normal diet and medications unless otherwise indicated. Drink lots of extra fluids today and tomorrow.   4. The incidence of headache, nausea, or vomiting is about 5% (one in 20 patients).  If you develop a headache, lie flat for 24 hours and drink plenty of fluids until the headache goes away.  Caffeinated beverages may be helpful. If when you get up you still have a headache when standing, go back to bed and force fluids for another 24 hours.   5. If you develop severe nausea and vomiting or a headache that does not go away with the flat bedrest after 48 hours, please call [phone].   6. Call your physician for a follow-up appointment.  The results of your myelogram will be sent directly to your physician by the following day.  7. If you have any questions or if complications develop after you arrive home, please call [phone].  Discharge instructions have been explained to the patient.  The patient, or the person responsible for the patient, fully understands these instructions.   Thank you for visiting our office today.  

## 2020-06-26 ENCOUNTER — Other Ambulatory Visit: Payer: Self-pay | Admitting: Nurse Practitioner

## 2020-06-26 ENCOUNTER — Encounter: Payer: Self-pay | Admitting: Nurse Practitioner

## 2020-06-26 DIAGNOSIS — F902 Attention-deficit hyperactivity disorder, combined type: Secondary | ICD-10-CM

## 2020-06-26 DIAGNOSIS — F411 Generalized anxiety disorder: Secondary | ICD-10-CM

## 2020-06-27 DIAGNOSIS — M542 Cervicalgia: Secondary | ICD-10-CM | POA: Diagnosis not present

## 2020-07-06 ENCOUNTER — Encounter: Payer: Self-pay | Admitting: Nurse Practitioner

## 2020-07-08 ENCOUNTER — Encounter: Payer: Self-pay | Admitting: Nurse Practitioner

## 2020-07-08 ENCOUNTER — Other Ambulatory Visit: Payer: Self-pay | Admitting: Nurse Practitioner

## 2020-07-08 DIAGNOSIS — F411 Generalized anxiety disorder: Secondary | ICD-10-CM

## 2020-07-08 DIAGNOSIS — F902 Attention-deficit hyperactivity disorder, combined type: Secondary | ICD-10-CM

## 2020-07-08 NOTE — Telephone Encounter (Signed)
Thank you :)

## 2020-07-08 NOTE — Telephone Encounter (Signed)
Good Morning  It looks like the only see [facility]   sent new referral to  Placed in  [place]

## 2020-07-11 DIAGNOSIS — R6889 Other general symptoms and signs: Secondary | ICD-10-CM | POA: Diagnosis not present

## 2020-07-22 DIAGNOSIS — M791 Myalgia, unspecified site: Secondary | ICD-10-CM | POA: Diagnosis not present

## 2020-07-22 DIAGNOSIS — R6889 Other general symptoms and signs: Secondary | ICD-10-CM | POA: Diagnosis not present

## 2020-07-22 DIAGNOSIS — Z20822 Contact with and (suspected) exposure to covid-19: Secondary | ICD-10-CM | POA: Diagnosis not present

## 2020-07-23 ENCOUNTER — Encounter: Payer: Self-pay | Admitting: Nurse Practitioner

## 2020-07-24 ENCOUNTER — Other Ambulatory Visit: Payer: Self-pay

## 2020-07-24 ENCOUNTER — Telehealth (INDEPENDENT_AMBULATORY_CARE_PROVIDER_SITE_OTHER): Payer: Medicaid Other | Admitting: Family Medicine

## 2020-07-24 DIAGNOSIS — D1779 Benign lipomatous neoplasm of other sites: Secondary | ICD-10-CM | POA: Diagnosis not present

## 2020-07-24 MED ORDER — DICLOFENAC SODIUM 1 % EX GEL: 2.0000 g | Freq: Four times a day (QID) | CUTANEOUS | 0 refills | Status: DC

## 2020-07-24 NOTE — Progress Notes (Signed)
Pt presents for painful mass on right side near ribs, pt did send picture thru Mychart  5/31

## 2020-07-24 NOTE — Progress Notes (Signed)
Virtual Visit via Telephone Note  I connected with Kellie Martinez, on 07/24/2020 at 11:22 AM by telephone due to the COVID-19 pandemic and verified that I am speaking with the correct person using two identifiers.   Consent: I discussed the limitations, risks, security and privacy concerns of performing an evaluation and management service by telephone and the availability of in person appointments. I also discussed with the patient that there may be a patient responsible charge related to this service. The patient expressed understanding and agreed to proceed.   Location of Patient: Home  Location of Provider: Clinic   Persons participating in Telemedicine visit: Kellie Martinez - CMA Dr. Margarita Martinez     History of Present Illness: 25 year old female patient of Kellie Rankins FNP with a history of Anxiety, Asthma, Bipolar disorder seen for an acute visit today.  She noticed a mass below the R side of her rib cage which she thinks is getting bigger and has been present for about 3 weeks. She initially said it hurts when she touches it and did not hurt when she didn't but later corrected herself to say that it was 8/10 when she did not touch it and 10/10 when she did. Denies a history of trauma. I sent her the link for a video visit as this will be better evaluated visually but she was unable to join the video visit. She had previously sent pictures of this via My chart to her PCP on 07/08/20.  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   Chronic kidney disease    Depression    GERD (gastroesophageal reflux disease)    Headache    otc med prn   History of kidney stones    HLD (hyperlipidemia)    diet controlled   Molar pregnancy    Pneumonia    x 1 yrs ago per patient 11/22/19   Renal calculus or stone    had stent/ lithotripsy in past   Sleep apnea     does not use cpap   Substance abuse ([place])    Suicide attempt ([place])    x 2   Allergies  Allergen Reactions   5-Alpha Reductase Inhibitors     Per pt "outbursts"   Chloramphenicols    Levofloxacin     Unknown reaction   Penicillins Hives    No anaphylaxis  Did it involve swelling of the face/tongue/throat, SOB, or low BP? No Did it involve sudden or severe rash/hives, skin peeling, or any reaction on the inside of your mouth or nose? Yes Did you need to seek medical attention at a hospital or doctor's office? Yes When did it last happen?      Childhood If all above answers are "NO", may proceed with cephalosporin use.    Phenergan [Promethazine Hcl]     Tremors, restless leg, akathesia 01-26-2018   Seroquel [Quetiapine]     Leg tremors     Current Outpatient Medications on File Prior to Visit  Medication Sig Dispense Refill   albuterol (VENTOLIN HFA) 108 (90 Base) MCG/ACT inhaler Inhale 2 puffs into the lungs every 6 (six) hours as needed for wheezing or shortness of breath.     amphetamine-dextroamphetamine (ADDERALL XR) 25 MG 24 hr capsule Take 1 capsule by mouth every morning. [place]  capsule 0   amphetamine-dextroamphetamine (ADDERALL XR) 25 MG 24 hr capsule Take 1 capsule by mouth every morning. 30 capsule 0   clonazePAM (KLONOPIN) 1 MG tablet TAKE 1 TABLET BY MOUTH TWICE DAILY AS NEEDED FOR ANXIETY 60 tablet 2   diclofenac (VOLTAREN) 75 MG EC tablet Take 1 tablet (75 mg total) by mouth 2 (two) times daily. (Patient not taking: Reported on 06/04/2020) 20 tablet 0   escitalopram (LEXAPRO) 20 MG tablet Take 1.5 tablets (30 mg total) by mouth daily. (Patient not taking: Reported on 06/04/2020) 45 tablet 2   lamoTRIgine (LAMICTAL) 150 MG tablet Take 1 tablet (150 mg total) by mouth 2 (two) times daily. 60 tablet 2   ondansetron (ZOFRAN) 4 MG tablet Take 1 tablet (4 mg total) by mouth every 8 (eight) hours as needed. 30 tablet 1   SUMAtriptan (IMITREX) 50 MG tablet Take 1 tablet (50 mg  total) by mouth every 2 (two) hours as needed for migraine. May repeat in 2 hours if headache persists or recurs. (Patient not taking: Reported on 06/04/2020) 20 tablet 0   zaleplon (SONATA) 10 MG capsule Take 1 capsule (10 mg total) by mouth at bedtime as needed for sleep. 30 capsule 2   Current Facility-Administered Medications on File Prior to Visit  Medication Dose Route Frequency Provider Last Rate Last Admin   bupivacaine liposome (EXPAREL) 1.3 % injection    PRN Kellie Cagey, MD   20 mL at 03/28/20 1233    ROS: See HPI  Observations/Objective: Awake, alert, oriented x3 Not in acute distress  Assessment and Plan: 1. Lipoma of other specified sites I have reviewed pictures she sent and it appears to be a lipoma I am limited by the inability to visualize this lesion either via video or in person Will proceed with imaging to further characterize - [place] CHEST SOFT TISSUE; Future   Follow Up Instructions: Keep previously scheduled appointment with PCP   I discussed the assessment and treatment plan with the patient. The patient was provided an opportunity to ask questions and all were answered. The patient agreed with the plan and demonstrated an understanding of the instructions.   The patient was advised to call back or seek an in-person evaluation if the symptoms worsen or if the condition fails to improve as anticipated.     I provided 13 minutes total of non-face-to-face time during this encounter.   Kellie Rakes, MD, FAAFP. [facility] and [facility] [place], [place]   07/24/2020, 11:22 AM

## 2020-08-04 ENCOUNTER — Telehealth: Payer: Medicaid Other | Admitting: Nurse Practitioner

## 2020-08-05 ENCOUNTER — Encounter: Payer: Self-pay | Admitting: Nurse Practitioner

## 2020-08-07 ENCOUNTER — Encounter: Payer: Self-pay | Admitting: Family Medicine

## 2020-08-08 ENCOUNTER — Ambulatory Visit (HOSPITAL_COMMUNITY): Payer: Medicaid Other | Attending: Family Medicine

## 2020-08-08 ENCOUNTER — Other Ambulatory Visit: Payer: Self-pay | Admitting: Nurse Practitioner

## 2020-08-08 MED ORDER — TRAZODONE HCL 100 MG PO TABS: 100.0000 mg | ORAL_TABLET | Freq: Every day | ORAL | 0 refills | Status: DC

## 2020-08-15 ENCOUNTER — Encounter: Payer: Self-pay | Admitting: Family Medicine

## 2020-08-22 ENCOUNTER — Telehealth (HOSPITAL_COMMUNITY): Payer: Self-pay | Admitting: *Deleted

## 2020-08-22 NOTE — Telephone Encounter (Signed)
Opened in error

## 2020-08-27 ENCOUNTER — Telehealth (HOSPITAL_COMMUNITY): Payer: Self-pay | Admitting: *Deleted

## 2020-08-27 MED ORDER — AMPHETAMINE-DEXTROAMPHET ER 25 MG PO CP24: 25.0000 mg | ORAL_CAPSULE | ORAL | 0 refills | Status: DC

## 2020-08-27 NOTE — Telephone Encounter (Signed)
Review last progress notes from Dr Montel Culver who retired and left the office. A thirty days bridge supply of Adderall is given by Covering MD. Patient must established care with new provider.

## 2020-08-27 NOTE — Telephone Encounter (Signed)
Former pt of Dr. Montel Culver who has received certified letter called requesting refill of the Adderall XR 25 mg. Please review.

## 2020-09-01 ENCOUNTER — Encounter: Payer: Self-pay | Admitting: Family Medicine

## 2020-09-01 ENCOUNTER — Encounter: Payer: Self-pay | Admitting: Nurse Practitioner

## 2020-09-01 NOTE — Telephone Encounter (Signed)
Pls schedule. Dr Margarita Rana ordered in June. Thanks!

## 2020-09-22 ENCOUNTER — Other Ambulatory Visit (HOSPITAL_COMMUNITY): Payer: Self-pay | Admitting: Psychiatry

## 2020-09-26 ENCOUNTER — Ambulatory Visit (HOSPITAL_COMMUNITY): Admission: RE | Admit: 2020-09-26 | Payer: Medicaid Other | Source: Ambulatory Visit

## 2020-09-28 ENCOUNTER — Encounter: Payer: Self-pay | Admitting: Nurse Practitioner

## 2020-09-29 ENCOUNTER — Other Ambulatory Visit: Payer: Self-pay | Admitting: Nurse Practitioner

## 2020-09-29 ENCOUNTER — Encounter: Payer: Self-pay | Admitting: Nurse Practitioner

## 2020-09-29 ENCOUNTER — Encounter: Payer: Self-pay | Admitting: Family Medicine

## 2020-09-29 DIAGNOSIS — N920 Excessive and frequent menstruation with regular cycle: Secondary | ICD-10-CM

## 2020-10-03 ENCOUNTER — Other Ambulatory Visit (HOSPITAL_COMMUNITY): Payer: Self-pay | Admitting: Psychiatry

## 2020-10-07 ENCOUNTER — Encounter: Payer: Self-pay | Admitting: Nurse Practitioner

## 2020-10-07 ENCOUNTER — Ambulatory Visit
Admission: EM | Admit: 2020-10-07 | Discharge: 2020-10-07 | Disposition: A | Discharge status: Home / Self Care | Payer: Medicaid Other | Attending: Physician Assistant | Admitting: Physician Assistant

## 2020-10-07 ENCOUNTER — Encounter: Payer: Self-pay | Admitting: Emergency Medicine

## 2020-10-07 ENCOUNTER — Other Ambulatory Visit: Payer: Self-pay

## 2020-10-07 DIAGNOSIS — B349 Viral infection, unspecified: Secondary | ICD-10-CM

## 2020-10-07 DIAGNOSIS — D171 Benign lipomatous neoplasm of skin and subcutaneous tissue of trunk: Secondary | ICD-10-CM

## 2020-10-07 MED ORDER — IBUPROFEN 800 MG PO TABS
800.0000 mg | ORAL_TABLET | Freq: Once | ORAL | Status: AC
  Administered 2020-10-07: 800 mg via ORAL

## 2020-10-07 NOTE — ED Triage Notes (Signed)
Pt is present today with HA, vomiting, hot flashes, and cold chills.Pt states that her sx started yesterday. Pt also has a knot on the right side of her flank. Pt states that she noticed it a couple days ago.

## 2020-10-07 NOTE — Discharge Instructions (Addendum)
Tylenol or ibuprofen for symptoms

## 2020-10-07 NOTE — ED Provider Notes (Signed)
[facility]    CSN: [id number] Arrival date & time: 10/07/20  0811      History   Chief Complaint Chief Complaint  Patient presents with   Emesis   Chills   Hot Flashes   Mass    Right side    Headache    HPI Kellie Martinez is a 25 y.o. female.   Pt complains of body aches and a knot on her right side.  Pt reports knot is getting bigger.  Pt reports she has had fever and chills   The history is provided by the patient.   Past Medical History:  Diagnosis Date   Anemia    Anxiety    Arthritis    lower back   Asthma    Bipolar 1 disorder ([place])    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   Chronic kidney disease    Depression    GERD (gastroesophageal reflux disease)    Headache    otc med prn   History of kidney stones    HLD (hyperlipidemia)    diet controlled   Molar pregnancy    Pneumonia    x 1 yrs ago per patient 11/22/19   Renal calculus or stone    had stent/ lithotripsy in past   Sleep apnea    does not use cpap   Substance abuse ([place])    Suicide attempt ([place])    x 2    Patient Active Problem List   Diagnosis Date Noted   Herniation of cervical intervertebral disc with radiculopathy 11/26/2019    Class: Chronic   Cervical disc disorder at C5-C6 level with radiculopathy 11/26/2019   GAD (generalized anxiety disorder) 01/03/2019   Attention deficit hyperactivity disorder (ADHD), combined type 01/03/2019   Bipolar 2 disorder ([place]) 01/03/2019   Precordial pain 07/07/2017   History of renal stone [id number]   Umbilical hernia without obstruction and without gangrene 05/18/2017   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   Cystic kidney disease 05/06/2017   Difficulty sleeping 05/06/2017   Gastroesophageal reflux disease without esophagitis 05/06/2017   History of drug overdose 05/06/2017    Past Surgical History:  Procedure Laterality Date   CERVICAL DISC ARTHROPLASTY N/A 11/26/2019   Procedure: CERVICAL DISC ARTHROPLASTY C5-6 WITH PRO [place];  Surgeon: Jessy Oto, MD;  Location: [place];  Service: Orthopedics;  Laterality: N/A;   CYSTOSCOPY     x2 with stone extraction   EXTRACORPOREAL SHOCK WAVE LITHOTRIPSY Right 06/20/2013   Procedure: EXTRACORPOREAL SHOCK WAVE LITHOTRIPSY (ESWL);  Surgeon: Edwin Dada, MD;  Location: [facility];  Service: Urology;  Laterality: Right;   FOOT SURGERY Left 2003   LITHOTRIPSY     TUBAL LIGATION  2017   TUBAL LIGATION     UMBILICAL HERNIA REPAIR N/A 03/28/2020   Procedure: HERNIA REPAIR UMBILICAL ADULT;  Surgeon: Virl Cagey, MD;  Location: [facility];  Service: General;  Laterality: N/A;    OB History   No obstetric history on file.      Home Medications    Prior to Admission medications   Medication Sig Start Date End Date Taking? Authorizing Provider  albuterol (VENTOLIN HFA) 108 (90 Base) MCG/ACT inhaler Inhale 2 puffs into the lungs every 6 (six) hours as needed for wheezing or shortness of breath.    [provider]  amphetamine-dextroamphetamine (ADDERALL XR) 25 MG 24 hr capsule Take 1 capsule by mouth every morning. 08/27/20 09/26/20  Arfeen, Arlyce Harman, MD  clonazePAM (KLONOPIN) 1 MG tablet TAKE 1  TABLET BY MOUTH TWICE DAILY AS NEEDED FOR ANXIETY 04/15/20   Pucilowski, Marchia Bond, MD  diclofenac (VOLTAREN) 75 MG EC tablet Take 1 tablet (75 mg total) by mouth 2 (two) times daily. Patient not taking: No sig reported 04/10/20   Evalee Jefferson, PA-C  diclofenac Sodium (VOLTAREN) 1 % GEL Apply 2 g topically 4 (four) times daily. 07/24/20   Charlott Rakes, MD  escitalopram (LEXAPRO) 20 MG tablet Take 1.5 tablets (30 mg total) by mouth daily. Patient not taking: No sig reported 03/13/20 06/11/20  Pucilowski, Marchia Bond, MD  lamoTRIgine (LAMICTAL) 150 MG tablet Take 1 tablet (150 mg total) by mouth 2 (two) times daily. 05/28/20 08/26/20  Pucilowski, Marchia Bond, MD  ondansetron (ZOFRAN) 4 MG tablet Take 1 tablet (4 mg total) by mouth every 8 (eight) hours as needed. 03/28/20 03/28/21  Virl Cagey, MD  SUMAtriptan  (IMITREX) 50 MG tablet Take 1 tablet (50 mg total) by mouth every 2 (two) hours as needed for migraine. May repeat in 2 hours if headache persists or recurs. Patient not taking: No sig reported 05/29/20 06/28/20  Gildardo Pounds, NP  traZODone (DESYREL) 100 MG tablet Take 1 tablet (100 mg total) by mouth at bedtime. 08/08/20   Gildardo Pounds, NP    Family History Family History  Problem Relation Age of Onset   Depression Mother    Asthma Mother    Hypertension Mother    Mood Disorder Mother    Heart disease Father    Kidney disease Father    Alcoholism Father    Breast cancer Paternal Aunt    Breast cancer Maternal Grandmother    Breast cancer Cousin    Alcoholism Brother    Drug abuse Brother     Social History Social History   Tobacco Use   Smoking status: Every Day    Packs/day: 0.25    Types: Cigarettes   Smokeless tobacco: Never   Tobacco comments:    3 cig/day  Vaping Use   Vaping Use: Former   Quit date: 02/01/2019  Substance Use Topics   Alcohol use: Not Currently    Comment:  weekends wine/liquor   Drug use: Not Currently    Types: Marijuana    Comment: Last use - 11/17/19     Allergies   5-alpha reductase inhibitors, Chloramphenicols, Levofloxacin, Penicillins, Phenergan [promethazine hcl], and Seroquel [quetiapine]   Review of Systems Review of Systems  Gastrointestinal:  Positive for vomiting.  Neurological:  Positive for headaches.  All other systems reviewed and are negative.   Physical Exam Triage Vital Signs ED Triage Vitals  Enc Vitals Group     BP 10/07/20 0826 102/70     Pulse Rate 10/07/20 0826 86     Resp 10/07/20 0826 17     Temp 10/07/20 0826 98.1 F (36.7 C)     Temp src --      SpO2 10/07/20 0826 98 %     Weight --      Height --      Head Circumference --      Peak Flow --      Pain Score 10/07/20 0829 10     Pain Loc --      Pain Edu? --      Excl. in [place]? --    No data found.  Updated Vital Signs BP 102/70   Pulse  86   Temp 98.1 F (36.7 C)   Resp 17   SpO2 98%  Visual Acuity Right Eye Distance:   Left Eye Distance:   Bilateral Distance:    Right Eye Near:   Left Eye Near:    Bilateral Near:     Physical Exam Vitals and nursing note reviewed.  Constitutional:      Appearance: She is well-developed.  HENT:     Head: Normocephalic.  Pulmonary:     Effort: Pulmonary effort is normal.  Abdominal:     General: There is no distension.  Musculoskeletal:        General: Normal range of motion.     Cervical back: Normal range of motion.  Skin:    Comments: 8cm area right lateral chest  feels like a lipoma   Neurological:     Mental Status: She is alert and oriented to person, place, and time.  Psychiatric:        Mood and Affect: Mood normal.     UC Treatments / Results  Labs (all labs ordered are listed, but only abnormal results are displayed) Labs Reviewed - No data to display  EKG   Radiology No results found.  Procedures Procedures (including critical care time)  Medications Ordered in UC Medications  ibuprofen (ADVIL) tablet 800 mg (has no administration in time range)    Initial Impression / Assessment and Plan / UC Course  I have reviewed the triage vital signs and the nursing notes.  Pertinent labs & imaging results that were available during my care of the patient were reviewed by me and considered in my medical decision making (see chart for details).     MDM:  FLu and covid ordered Pt counseled on lipoma's.  Pt advised to follow up with surgery if area bothers her.  Final Clinical Impressions(s) / UC Diagnoses   Final diagnoses:  Viral illness   Discharge Instructions   None    ED Prescriptions   None    PDMP not reviewed this encounter. An After Visit Summary was printed and given to the patient.    Fransico Meadow, [place] 10/07/20 [phone]

## 2020-10-08 ENCOUNTER — Encounter: Payer: Self-pay | Admitting: Nurse Practitioner

## 2020-10-08 ENCOUNTER — Other Ambulatory Visit: Payer: Self-pay | Admitting: Nurse Practitioner

## 2020-10-08 MED ORDER — OSELTAMIVIR PHOSPHATE 75 MG PO CAPS: 75.0000 mg | ORAL_CAPSULE | Freq: Two times a day (BID) | ORAL | 0 refills | Status: AC

## 2020-10-09 LAB — COVID-19, FLU A+B NAA
Influenza A, NAA: NOT DETECTED
Influenza B, NAA: NOT DETECTED
SARS-CoV-2, NAA: NOT DETECTED

## 2020-10-13 ENCOUNTER — Encounter: Payer: Self-pay | Admitting: Nurse Practitioner

## 2020-10-14 ENCOUNTER — Other Ambulatory Visit: Payer: Self-pay | Admitting: Nurse Practitioner

## 2020-10-14 MED ORDER — GABAPENTIN 300 MG PO CAPS: 300.0000 mg | ORAL_CAPSULE | Freq: Three times a day (TID) | ORAL | 3 refills | Status: DC

## 2020-10-22 ENCOUNTER — Other Ambulatory Visit: Payer: Self-pay | Admitting: Nurse Practitioner

## 2020-10-22 ENCOUNTER — Encounter: Payer: Self-pay | Admitting: Nurse Practitioner

## 2020-10-22 ENCOUNTER — Ambulatory Visit: Payer: Self-pay | Admitting: *Deleted

## 2020-10-22 MED ORDER — ALBUTEROL SULFATE HFA 108 (90 BASE) MCG/ACT IN AERS: 2.0000 | INHALATION_SPRAY | Freq: Four times a day (QID) | RESPIRATORY_TRACT | 1 refills | Status: DC | PRN

## 2020-10-22 MED ORDER — PREDNISONE 20 MG PO TABS: 20.0000 mg | ORAL_TABLET | Freq: Every day | ORAL | 0 refills | Status: AC

## 2020-10-22 NOTE — Telephone Encounter (Signed)
Pt sent MyChart message earlier today; calling back to check status. Pt reports dry cough x 2 weeks. Reports severe, "Sometimes vomit up my food." Reports wheezing, SOB at rest. Unsure if febrile "Hot and cold." States tested positive for flu 10/07/20, negative covid. Has been taking OTC meds,NyQuil, DayQuil, Cold and Flu, states ineffective.  Assured pt NT would route to practice for PCPs review. Advised ED for worsening symptoms. Pt verbalizes understanding.     Reason for Disposition  Wheezing is present  Answer Assessment - Initial Assessment Questions 1. ONSET: "When did the cough begin?"      2 weeks ago. Had flu 10/07/20 2. SEVERITY: "How bad is the cough today?"      Severe 3. SPUTUM: "Describe the color of your sputum" (none, dry cough; clear, white, yellow, green)     Food comes up when vomiting 4. HEMOPTYSIS: "Are you coughing up any blood?" If so ask: "How much?" (flecks, streaks, tablespoons, etc.)     no 5. DIFFICULTY BREATHING: "Are you having difficulty breathing?" If Yes, ask: "How bad is it?" (e.g., mild, moderate, severe)    - MILD: No SOB at rest, mild SOB with walking, speaks normally in sentences, can lie down, no retractions, pulse < 100.    - MODERATE: SOB at rest, SOB with minimal exertion and prefers to sit, cannot lie down flat, speaks in phrases, mild retractions, audible wheezing, pulse 100-120.    - SEVERE: Very SOB at rest, speaks in single words, struggling to breathe, sitting hunched forward, retractions, pulse > 120      Moderate 6. FEVER: "Do you have a fever?" If Yes, ask: "What is your temperature, how was it measured, and when did it start?"     Unsure, hot and cold 7. CARDIAC HISTORY: "Do you have any history of heart disease?" (e.g., heart attack, congestive heart failure)       8. LUNG HISTORY: "Do you have any history of lung disease?"  (e.g., pulmonary embolus, asthma, emphysema)     asthma 9. PE RISK FACTORS: "Do you have a history of blood  clots?" (or: recent major surgery, recent prolonged travel, bedridden)      10. OTHER SYMPTOMS: "Do you have any other symptoms?" (e.g., runny nose, wheezing, chest pain)      Wheezing 11. PREGNANCY: "Is there any chance you are pregnant?" "When was your last menstrual period?"       no  Protocols used: Cough - Acute Non-Productive-A-AH

## 2020-10-22 NOTE — Telephone Encounter (Signed)
Sent my chart message back and left voicemail for appointment availability tomorrow.

## 2020-11-04 ENCOUNTER — Other Ambulatory Visit: Payer: Self-pay

## 2020-11-04 ENCOUNTER — Ambulatory Visit (INDEPENDENT_AMBULATORY_CARE_PROVIDER_SITE_OTHER): Payer: Medicaid Other | Admitting: General Surgery

## 2020-11-04 ENCOUNTER — Encounter: Payer: Self-pay | Admitting: General Surgery

## 2020-11-04 VITALS — BP 109/75 | HR 60 | Temp 98.5°F | Resp 16 | Ht 62.0 in | Wt 195.0 lb

## 2020-11-04 DIAGNOSIS — D171 Benign lipomatous neoplasm of skin and subcutaneous tissue of trunk: Secondary | ICD-10-CM

## 2020-11-04 NOTE — Progress Notes (Signed)
[facility] History and Physical  Reason for Referral: Lipoma  Referring Physician: Self   Chief Complaint   Other     Kellie Martinez is a 25 y.o. female.  HPI: Kellie Martinez is a 25 yo I know from prior umbilical hernia repair. She has been doing well but says over the past few months she has noticed a lump on the right flank that has been sore at times. She does think it has really grown since she noticed it. It has never been infected or red. It is mostly noticeable when she is twisting around. She can see it in the mirror. She has no other prior history of any lipomas or masses.   Past Medical History:  Diagnosis Date   Anemia    Anxiety    Arthritis    lower back   Asthma    Bipolar 1 disorder ([place])    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   Chronic kidney disease    Depression    GERD (gastroesophageal reflux disease)    Headache    otc med prn   History of kidney stones    HLD (hyperlipidemia)    diet controlled   Molar pregnancy    Pneumonia    x 1 yrs ago per patient 11/22/19   Renal calculus or stone    had stent/ lithotripsy in past   Sleep apnea    does not use cpap   Substance abuse ([place])    Suicide attempt ([place])    x 2    Past Surgical History:  Procedure Laterality Date   CERVICAL DISC ARTHROPLASTY N/A 11/26/2019   Procedure: CERVICAL DISC ARTHROPLASTY C5-6 WITH PRO [place];  Surgeon: Jessy Oto, MD;  Location: [facility];  Service: Orthopedics;  Laterality: N/A;   CYSTOSCOPY     x2 with stone extraction   EXTRACORPOREAL SHOCK WAVE LITHOTRIPSY Right 06/20/2013   Procedure: EXTRACORPOREAL SHOCK WAVE LITHOTRIPSY (ESWL);  Surgeon: Edwin Dada, MD;  Location: [facility];  Service: Urology;  Laterality: Right;   FOOT SURGERY Left 2003   LITHOTRIPSY     TUBAL LIGATION  2017   TUBAL LIGATION     UMBILICAL HERNIA REPAIR N/A 03/28/2020   Procedure: HERNIA REPAIR UMBILICAL ADULT;  Surgeon: Virl Cagey, MD;  Location: [facility];  Service: General;  Laterality:  N/A;    Family History  Problem Relation Age of Onset   Depression Mother    Asthma Mother    Hypertension Mother    Mood Disorder Mother    Heart disease Father    Kidney disease Father    Alcoholism Father    Breast cancer Paternal Aunt    Breast cancer Maternal Grandmother    Breast cancer Cousin    Alcoholism Brother    Drug abuse Brother     Social History   Tobacco Use   Smoking status: Every Day    Packs/day: 0.25    Types: Cigarettes   Smokeless tobacco: Never   Tobacco comments:    3 cig/day  Vaping Use   Vaping Use: Former   Quit date: 02/01/2019  Substance Use Topics   Alcohol use: Not Currently    Comment:  weekends wine/liquor   Drug use: Not Currently    Types: Marijuana    Comment: Last use - 11/17/19    Medications: I have reviewed the patient's current medications. Allergies as of 11/04/2020       Reactions   5-alpha Reductase Inhibitors    Per  pt "outbursts"   Chloramphenicols    Levofloxacin    Unknown reaction   Penicillins Hives   No anaphylaxis Did it involve swelling of the face/tongue/throat, SOB, or low BP? No Did it involve sudden or severe rash/hives, skin peeling, or any reaction on the inside of your mouth or nose? Yes Did you need to seek medical attention at a hospital or doctor's office? Yes When did it last happen?      Childhood If all above answers are "NO", may proceed with cephalosporin use.   Phenergan [promethazine Hcl]    Tremors, restless leg, akathesia 01-26-2018   Seroquel [quetiapine]    Leg tremors         Medication List        Accurate as of November 04, 2020 10:10 AM. If you have any questions, ask your nurse or doctor.          STOP taking these medications    escitalopram 20 MG tablet Commonly known as: Lexapro Stopped by: Virl Cagey, MD   SUMAtriptan 50 MG tablet Commonly known as: Imitrex Stopped by: Virl Cagey, MD       TAKE these medications    albuterol 108 (90  Base) MCG/ACT inhaler Commonly known as: VENTOLIN HFA Inhale 2 puffs into the lungs every 6 (six) hours as needed for wheezing or shortness of breath.   amphetamine-dextroamphetamine 25 MG 24 hr capsule Commonly known as: Adderall XR Take 1 capsule by mouth every morning.   clonazePAM 1 MG tablet Commonly known as: KLONOPIN TAKE 1 TABLET BY MOUTH TWICE DAILY AS NEEDED FOR ANXIETY   diclofenac 75 MG EC tablet Commonly known as: VOLTAREN Take 1 tablet (75 mg total) by mouth 2 (two) times daily.   diclofenac Sodium 1 % Gel Commonly known as: Voltaren Apply 2 g topically 4 (four) times daily.   gabapentin 300 MG capsule Commonly known as: NEURONTIN Take 1 capsule (300 mg total) by mouth 3 (three) times daily.   lamoTRIgine 150 MG tablet Commonly known as: LaMICtal Take 1 tablet (150 mg total) by mouth 2 (two) times daily.   ondansetron 4 MG tablet Commonly known as: Zofran Take 1 tablet (4 mg total) by mouth every 8 (eight) hours as needed.   traZODone 100 MG tablet Commonly known as: DESYREL Take 1 tablet (100 mg total) by mouth at bedtime.         ROS:  A comprehensive review of systems was negative except for: Musculoskeletal: positive for lump on right flank area  Blood pressure 109/75, pulse 60, temperature 98.5 F (36.9 C), temperature source Other (Comment), resp. rate 16, height 5\' 2"  (1.575 m), weight 195 lb (88.5 kg), SpO2 98 %. Physical Exam Vitals reviewed.  Constitutional:      Appearance: Normal appearance.  HENT:     Head: Normocephalic.     Nose: Nose normal.     Mouth/Throat:     Mouth: Mucous membranes are moist.  Cardiovascular:     Rate and Rhythm: Normal rate and regular rhythm.  Pulmonary:     Effort: Pulmonary effort is normal.     Breath sounds: Normal breath sounds.  Abdominal:     General: There is no distension.     Palpations: Abdomen is soft.     Tenderness: There is no abdominal tenderness.  Musculoskeletal:        General:  Normal range of motion.     Cervical back: Normal range of motion.  Comments: Right flank just under ribcage, superficial mass, 4cm  Skin:    General: Skin is warm.  Neurological:     General: No focal deficit present.     Mental Status: She is alert and oriented to person, place, and time.  Psychiatric:        Mood and Affect: Mood normal.        Behavior: Behavior normal.        Thought Content: Thought content normal.        Judgment: Judgment normal.    Results: None    Assessment & Plan:  Kellie Martinez is a 25 y.o. female with a lipoma on her right flank. She wants to get it removed before it gets any larger or causes her more soreness or pain. Discussed excision and risk of bleeding, infection, recurrence.  Lipoma excision All questions were answered to the satisfaction of the patient.    Virl Cagey 11/04/2020, 10:10 AM

## 2020-11-04 NOTE — Patient Instructions (Signed)
Lipoma  A lipoma is a noncancerous (benign) tumor that is made up of fat cells. This is a very common type of soft-tissue growth. Lipomas are usually found under the skin (subcutaneous). They may occur in any tissue of the body that contains fat. Common areas forlipomas to appear include the back, arms, shoulders, buttocks, and thighs. Lipomas grow slowly, and they are usually painless. Most lipomas do not causeproblems and do not require treatment. What are the causes? The cause of this condition is not known. What increases the risk? You are more likely to develop this condition if: You are 40-60 years old. You have a family history of lipomas. What are the signs or symptoms? A lipoma usually appears as a small, round bump under the skin. In most cases, the lump will: Feel soft or rubbery. Not cause pain or other symptoms. However, if a lipoma is located in an area where it pushes on nerves, it canbecome painful or cause other symptoms. How is this diagnosed? A lipoma can usually be diagnosed with a physical exam. You may also have tests to confirm the diagnosis and to rule out other conditions. Tests may include: Imaging tests, such as a CT scan or an MRI. Removal of a tissue sample to be looked at under a microscope (biopsy). How is this treated? Treatment for this condition depends on the size of the lipoma and whether it is causing any symptoms. For small lipomas that are not causing problems, no treatment is needed. If a lipoma is bigger or it causes problems, surgery may be done to remove the lipoma. Lipomas can also be removed to improve appearance. Most often, the procedure is done after applying a medicine that numbs the area (local anesthetic). Liposuction may be done to reduce the size of the lipoma before it is removed through surgery, or it may be done to remove the lipoma. Lipomas are removed with this method in order to limit incision size and scarring. A liposuction tube is  inserted through a small incision into the lipoma, and the contents of the lipoma are removed through the tube with suction. Follow these instructions at home: Watch your lipoma for any changes. Keep all follow-up visits as told by your health care provider. This is important. Contact a health care provider if: Your lipoma becomes larger or hard. Your lipoma becomes painful, red, or increasingly swollen. These could be signs of infection or a more serious condition. Get help right away if: You develop tingling or numbness in an area near the lipoma. This could indicate that the lipoma is causing nerve damage. Summary A lipoma is a noncancerous tumor that is made up of fat cells. Most lipomas do not cause problems and do not require treatment. If a lipoma is bigger or it causes problems, surgery may be done to remove the lipoma. Contact a health care provider if your lipoma becomes larger or hard, or if it becomes painful, red, or increasingly swollen. Pain, redness, and swelling could be signs of infection or a more serious condition. This information is not intended to replace advice given to you by your health care provider. Make sure you discuss any questions you have with your healthcare provider. Document Revised: 09/11/2018 Document Reviewed: 09/11/2018 Elsevier Patient Education  2022 Elsevier Inc[employer]  

## 2020-11-05 NOTE — H&P (Signed)
[facility] History and Physical  Reason for Referral: Lipoma  Referring Physician: Self   Chief Complaint   Other     Kellie Martinez is a 25 y.o. female.  HPI: Kellie Martinez is a 25 yo I know from prior umbilical hernia repair. She has been doing well but says over the past few months she has noticed a lump on the right flank that has been sore at times. She does think it has really grown since she noticed it. It has never been infected or red. It is mostly noticeable when she is twisting around. She can see it in the mirror. She has no other prior history of any lipomas or masses.   Past Medical History:  Diagnosis Date   Anemia    Anxiety    Arthritis    lower back   Asthma    Bipolar 1 disorder ([place])    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   Chronic kidney disease    Depression    GERD (gastroesophageal reflux disease)    Headache    otc med prn   History of kidney stones    HLD (hyperlipidemia)    diet controlled   Molar pregnancy    Pneumonia    x 1 yrs ago per patient 11/22/19   Renal calculus or stone    had stent/ lithotripsy in past   Sleep apnea    does not use cpap   Substance abuse ([place])    Suicide attempt ([place])    x 2    Past Surgical History:  Procedure Laterality Date   CERVICAL DISC ARTHROPLASTY N/A 11/26/2019   Procedure: CERVICAL DISC ARTHROPLASTY C5-6 WITH PRO [place];  Surgeon: Jessy Oto, MD;  Location: [facility];  Service: Orthopedics;  Laterality: N/A;   CYSTOSCOPY     x2 with stone extraction   EXTRACORPOREAL SHOCK WAVE LITHOTRIPSY Right 06/20/2013   Procedure: EXTRACORPOREAL SHOCK WAVE LITHOTRIPSY (ESWL);  Surgeon: Edwin Dada, MD;  Location: [facility];  Service: Urology;  Laterality: Right;   FOOT SURGERY Left 2003   LITHOTRIPSY     TUBAL LIGATION  2017   TUBAL LIGATION     UMBILICAL HERNIA REPAIR N/A 03/28/2020   Procedure: HERNIA REPAIR UMBILICAL ADULT;  Surgeon: Virl Cagey, MD;  Location: [facility];  Service: General;  Laterality:  N/A;    Family History  Problem Relation Age of Onset   Depression Mother    Asthma Mother    Hypertension Mother    Mood Disorder Mother    Heart disease Father    Kidney disease Father    Alcoholism Father    Breast cancer Paternal Aunt    Breast cancer Maternal Grandmother    Breast cancer Cousin    Alcoholism Brother    Drug abuse Brother     Social History   Tobacco Use   Smoking status: Every Day    Packs/day: 0.25    Types: Cigarettes   Smokeless tobacco: Never   Tobacco comments:    3 cig/day  Vaping Use   Vaping Use: Former   Quit date: 02/01/2019  Substance Use Topics   Alcohol use: Not Currently    Comment:  weekends wine/liquor   Drug use: Not Currently    Types: Marijuana    Comment: Last use - 11/17/19    Medications: I have reviewed the patient's current medications. Allergies as of 11/04/2020       Reactions   5-alpha Reductase Inhibitors    Per  pt "outbursts"   Chloramphenicols    Levofloxacin    Unknown reaction   Penicillins Hives   No anaphylaxis Did it involve swelling of the face/tongue/throat, SOB, or low BP? No Did it involve sudden or severe rash/hives, skin peeling, or any reaction on the inside of your mouth or nose? Yes Did you need to seek medical attention at a hospital or doctor's office? Yes When did it last happen?      Childhood If all above answers are "NO", may proceed with cephalosporin use.   Phenergan [promethazine Hcl]    Tremors, restless leg, akathesia 01-26-2018   Seroquel [quetiapine]    Leg tremors         Medication List        Accurate as of November 04, 2020 10:10 AM. If you have any questions, ask your nurse or doctor.          STOP taking these medications    escitalopram 20 MG tablet Commonly known as: Lexapro Stopped by: Virl Cagey, MD   SUMAtriptan 50 MG tablet Commonly known as: Imitrex Stopped by: Virl Cagey, MD       TAKE these medications    albuterol 108 (90  Base) MCG/ACT inhaler Commonly known as: VENTOLIN HFA Inhale 2 puffs into the lungs every 6 (six) hours as needed for wheezing or shortness of breath.   amphetamine-dextroamphetamine 25 MG 24 hr capsule Commonly known as: Adderall XR Take 1 capsule by mouth every morning.   clonazePAM 1 MG tablet Commonly known as: KLONOPIN TAKE 1 TABLET BY MOUTH TWICE DAILY AS NEEDED FOR ANXIETY   diclofenac 75 MG EC tablet Commonly known as: VOLTAREN Take 1 tablet (75 mg total) by mouth 2 (two) times daily.   diclofenac Sodium 1 % Gel Commonly known as: Voltaren Apply 2 g topically 4 (four) times daily.   gabapentin 300 MG capsule Commonly known as: NEURONTIN Take 1 capsule (300 mg total) by mouth 3 (three) times daily.   lamoTRIgine 150 MG tablet Commonly known as: LaMICtal Take 1 tablet (150 mg total) by mouth 2 (two) times daily.   ondansetron 4 MG tablet Commonly known as: Zofran Take 1 tablet (4 mg total) by mouth every 8 (eight) hours as needed.   traZODone 100 MG tablet Commonly known as: DESYREL Take 1 tablet (100 mg total) by mouth at bedtime.         ROS:  A comprehensive review of systems was negative except for: Musculoskeletal: positive for lump on right flank area  Blood pressure 109/75, pulse 60, temperature 98.5 F (36.9 C), temperature source Other (Comment), resp. rate 16, height 5\' 2"  (1.575 m), weight 195 lb (88.5 kg), SpO2 98 %. Physical Exam Vitals reviewed.  Constitutional:      Appearance: Normal appearance.  HENT:     Head: Normocephalic.     Nose: Nose normal.     Mouth/Throat:     Mouth: Mucous membranes are moist.  Cardiovascular:     Rate and Rhythm: Normal rate and regular rhythm.  Pulmonary:     Effort: Pulmonary effort is normal.     Breath sounds: Normal breath sounds.  Abdominal:     General: There is no distension.     Palpations: Abdomen is soft.     Tenderness: There is no abdominal tenderness.  Musculoskeletal:        General:  Normal range of motion.     Cervical back: Normal range of motion.  Comments: Right flank just under ribcage, superficial mass, 4cm  Skin:    General: Skin is warm.  Neurological:     General: No focal deficit present.     Mental Status: She is alert and oriented to person, place, and time.  Psychiatric:        Mood and Affect: Mood normal.        Behavior: Behavior normal.        Thought Content: Thought content normal.        Judgment: Judgment normal.    Results: None    Assessment & Plan:  EDIA PURSIFULL is a 25 y.o. female with a lipoma on her right flank. She wants to get it removed before it gets any larger or causes her more soreness or pain. Discussed excision and risk of bleeding, infection, recurrence.  Lipoma excision All questions were answered to the satisfaction of the patient.    Virl Cagey 11/04/2020, 10:10 AM

## 2020-11-10 ENCOUNTER — Other Ambulatory Visit: Payer: Self-pay | Admitting: Nurse Practitioner

## 2020-11-10 NOTE — Telephone Encounter (Signed)
Requested medication (s) are due for refill today: yes  Requested medication (s) are on the active medication list:yes   Last refill: 08/08/20   #90  0 refills  Future visit scheduled yes 11/11/20  Notes to clinic: Pt has appt tomorrow 11/11/20. Please review. Thank you.  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

## 2020-11-11 ENCOUNTER — Other Ambulatory Visit: Payer: Self-pay

## 2020-11-11 ENCOUNTER — Telehealth: Payer: Self-pay | Admitting: Nurse Practitioner

## 2020-11-11 ENCOUNTER — Encounter: Payer: Self-pay | Admitting: Nurse Practitioner

## 2020-11-11 ENCOUNTER — Ambulatory Visit: Payer: Medicaid Other | Attending: Nurse Practitioner | Admitting: Nurse Practitioner

## 2020-11-11 DIAGNOSIS — F419 Anxiety disorder, unspecified: Secondary | ICD-10-CM

## 2020-11-11 DIAGNOSIS — F32A Depression, unspecified: Secondary | ICD-10-CM

## 2020-11-11 DIAGNOSIS — F319 Bipolar disorder, unspecified: Secondary | ICD-10-CM | POA: Diagnosis not present

## 2020-11-11 MED ORDER — LAMOTRIGINE 150 MG PO TABS: 150.0000 mg | ORAL_TABLET | Freq: Two times a day (BID) | ORAL | 6 refills | Status: DC

## 2020-11-11 NOTE — Telephone Encounter (Signed)
Medication Refill - Medication: Trazodone   Has the patient contacted their pharmacy? No. Pt called stating that she only has 4 left. She states that she spoke with PCP today and was advised this would be sent in for her. Please advise.  (Agent: If no, request that the patient contact the pharmacy for the refill.) (Agent: If yes, when and what did the pharmacy advise?)  Preferred Pharmacy (with phone number or street name):  [facility] [place] [address]  [address] [place] [address]  Phone: [phone] Fax: [phone]  Hours: Not open 24 hours   Has the patient been seen for an appointment in the last year OR does the patient have an upcoming appointment? Yes.    Agent: Please be advised that RX refills may take up to 3 business days. We ask that you follow-up with your pharmacy.

## 2020-11-11 NOTE — Progress Notes (Signed)
Virtual Visit via Telephone Note Due to national recommendations of social distancing due to [place] 19, telehealth visit is felt to be most appropriate for this patient at this time.  I discussed the limitations, risks, security and privacy concerns of performing an evaluation and management service by telephone and the availability of in person appointments. I also discussed with the patient that there may be a patient responsible charge related to this service. The patient expressed understanding and agreed to proceed.    I connected with Kellie Martinez on 11/11/20  at   8:30 AM EDT  EDT by telephone and verified that I am speaking with the correct person using two identifiers.  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   Bipolar disorder Still waiting to be scheduled with psychiatry. I have instructed her to also follow up with her insurance provider for [facility]. The referrals we have placed have been denied by previous psychiatrists she has been a patient of. Associated symptoms: insomnia. Trazodone has been ineffective.   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   Chronic kidney disease    Depression    GERD (gastroesophageal reflux disease)    Headache    otc med prn   History of kidney stones    HLD (hyperlipidemia)    diet controlled   Molar pregnancy    Pneumonia    x 1 yrs ago per patient 11/22/19   Renal calculus or stone    had stent/ lithotripsy in past   Sleep apnea    does not use  cpap   Substance abuse ([place])    Suicide attempt ([place])    x 2    Past Surgical History:  Procedure Laterality Date   CERVICAL DISC ARTHROPLASTY N/A 11/26/2019   Procedure: CERVICAL DISC ARTHROPLASTY C5-6 WITH PRO [place];  Surgeon: Jessy Oto, MD;  Location: [facility];  Service: Orthopedics;  Laterality: N/A;   CYSTOSCOPY     x2 with stone extraction   EXTRACORPOREAL SHOCK WAVE LITHOTRIPSY Right 06/20/2013   Procedure: EXTRACORPOREAL SHOCK WAVE LITHOTRIPSY (ESWL);  Surgeon: Edwin Dada, MD;  Location: [facility];  Service: Urology;  Laterality: Right;   FOOT SURGERY Left 2003   LITHOTRIPSY     TUBAL LIGATION  2017   TUBAL LIGATION     UMBILICAL HERNIA REPAIR N/A 03/28/2020   Procedure: HERNIA REPAIR UMBILICAL ADULT;  Surgeon: Virl Cagey, MD;  Location: [facility];  Service: General;  Laterality: N/A;    Family History  Problem Relation Age of Onset   Depression Mother    Asthma Mother    Hypertension Mother    Mood Disorder Mother    Heart disease Father    Kidney disease Father    Alcoholism Father    Breast cancer Paternal Aunt    Breast cancer Maternal Grandmother    Breast cancer Cousin    Alcoholism Brother    Drug abuse Brother     Social  History   Socioeconomic History   Marital status: Single    Spouse name: Not on file   Number of children: 3   Years of education: Not on file   Highest education level: Not on file  Occupational History   Not on file  Tobacco Use   Smoking status: Every Day    Packs/day: 0.25    Types: Cigarettes   Smokeless tobacco: Never   Tobacco comments:    3 cig/day  Vaping Use   Vaping Use: Former   Quit date: 02/01/2019  Substance and Sexual Activity   Alcohol use: Not Currently    Comment:  weekends wine/liquor   Drug use: Not Currently    Types: Marijuana    Comment: Last use - 11/17/19   Sexual activity: Yes    Birth control/protection: Surgical    Comment: Tubal  Other Topics Concern   Not on file  Social History  Narrative   Not on file   Social Determinants of Health   Financial Resource Strain: Not on file  Food Insecurity: Not on file  Transportation Needs: Not on file  Physical Activity: Not on file  Stress: Not on file  Social Connections: Not on file     Observations/Objective: Awake, alert and oriented x 3   Review of Systems  Constitutional:  Negative for fever, malaise/fatigue and weight loss.  HENT: Negative.  Negative for nosebleeds.   Eyes: Negative.  Negative for blurred vision, double vision and photophobia.  Respiratory: Negative.  Negative for cough and shortness of breath.   Cardiovascular: Negative.  Negative for chest pain, palpitations and leg swelling.  Gastrointestinal: Negative.  Negative for heartburn, nausea and vomiting.  Musculoskeletal: Negative.  Negative for myalgias.  Neurological: Negative.  Negative for dizziness, focal weakness, seizures and headaches.  Psychiatric/Behavioral:  Positive for depression. Negative for suicidal ideas. The patient is nervous/anxious and has insomnia.    Assessment and Plan: Diagnoses and all orders for this visit:  Anxiety and depression -     Ambulatory referral to Psychiatry -     lamoTRIgine (LAMICTAL) 150 MG tablet; Take 1 tablet (150 mg total) by mouth 2 (two) times daily.  Bipolar depression ([place]) -     Ambulatory referral to Psychiatry -     lamoTRIgine (LAMICTAL) 150 MG tablet; Take 1 tablet (150 mg total) by mouth 2 (two) times daily.    Follow Up Instructions Return in about 3 months (around 02/11/2021).     I discussed the assessment and treatment plan with the patient. The patient was provided an opportunity to ask questions and all were answered. The patient agreed with the plan and demonstrated an understanding of the instructions.   The patient was advised to call back or seek an in-person evaluation if the symptoms worsen or if the condition fails to improve as anticipated.  I provided 10 minutes of  non-face-to-face time during this encounter including median intraservice time, reviewing previous notes, labs, imaging, medications and explaining diagnosis and management.  Gildardo Pounds, FNP-BC

## 2020-11-11 NOTE — Telephone Encounter (Signed)
NO answer. LVM. Will attempt to reach patient again within the next hours

## 2020-11-12 ENCOUNTER — Encounter: Payer: Medicaid Other | Admitting: Advanced Practice Midwife

## 2020-11-13 NOTE — Patient Instructions (Signed)
Kellie Martinez  11/13/2020     @PREFPERIOPPHARMACY @   Your procedure is scheduled on 11/19/2020.   Report to [name] at  0700 A.M.   Call this number if you have problems the morning of surgery:  [phone]   Remember:  Do not eat or drink after midnight.      Take these medicines the morning of surgery with A SIP OF WATER                                        gabapentin, lamictal.      Do not wear jewelry, make-up or nail polish.  Do not wear lotions, powders, or perfumes, or deodorant.  Do not shave 48 hours prior to surgery.  Men may shave face and neck.  Do not bring valuables to the hospital.  [facility] is not responsible for any belongings or valuables.  Contacts, dentures or bridgework may not be worn into surgery.  Leave your suitcase in the car.  After surgery it may be brought to your room.  For patients admitted to the hospital, discharge time will be determined by your treatment team.  Patients discharged the day of surgery will not be allowed to drive home and must have someone with them for 24 hours.    Special instructions:  DO NOT smoke tobacco or vape for 24 hours before your procedure.  Please read over the following fact sheets that you were given. Coughing and Deep Breathing, Surgical Site Infection Prevention, Anesthesia Post-op Instructions, and Care and Recovery After Surgery      Lipoma Removal, Care After This sheet gives you information about how to care for yourself after your procedure. Your health care provider may also give you more specific instructions. If you have problems or questions, contact your health care provider. What can I expect after the procedure? After the procedure, it is common to have: Mild pain. Swelling. Bruising. Follow these instructions at home: Bathing  Do not take baths, swim, or use a hot tub until your health care provider approves. Ask your health care provider if you may take  showers. You may only be allowed to take sponge baths. Keep your bandage (dressing) dry until your health care provider says it can be removed. Incision care  Follow instructions from your health care provider about how to take care of your incision. Make sure you: Wash your hands with soap and water for at least 20 seconds before and after you change your dressing. If soap and water are not available, use hand sanitizer. Change your dressing as told by your health care provider. Leave stitches (sutures), skin glue, or adhesive strips in place. These skin closures may need to stay in place for 2 weeks or longer. If adhesive strip edges start to loosen and curl up, you may trim the loose edges. Do not remove adhesive strips completely unless your health care provider tells you to do that. Check your incision area every day for signs of infection. Check for: More redness, swelling, or pain. Fluid or blood. Warmth. Pus or a bad smell. Medicines Take over-the-counter and prescription medicines only as told by your health care provider. If you were prescribed an antibiotic medicine, use it as told by your health care provider. Do not stop using the antibiotic even if you start to feel better. General  instructions  If you were given a sedative during the procedure, it can affect you for several hours. Do not drive or operate machinery until your health care provider says that it is safe. Do not use any products that contain nicotine or tobacco, such as cigarettes, e-cigarettes, and chewing tobacco. These can delay healing. If you need help quitting, ask your health care provider. Return to your normal activities as told by your health care provider. Ask your health care provider what activities are safe for you. Keep all follow-up visits as told by your health care provider. This is important. Contact a health care provider if: You have more redness, swelling, or pain around your incision. You have  fluid or blood coming from your incision. Your incision feels warm to the touch. You have pus or a bad smell coming from your incision. You have pain that does not get better with medicine. Get help right away if: You have chills or a fever. You have severe pain. Summary After the procedure, it is common to have mild pain, swelling, and bruising. Follow instructions from your health care provider about how to take care of your incision. Check your incision area every day for signs of infection. Contact a health care provider if you have more redness, swelling, or pain around your incision. This information is not intended to replace advice given to you by your health care provider. Make sure you discuss any questions you have with your health care provider. Document Revised: 09/11/2018 Document Reviewed: 09/11/2018 Elsevier Patient Education  [place] Anesthesia, Adult, Care After This sheet gives you information about how to care for yourself after your procedure. Your health care provider may also give you more specific instructions. If you have problems or questions, contact your health care provider. What can I expect after the procedure? After the procedure, the following side effects are common: Pain or discomfort at the IV site. Nausea. Vomiting. Sore throat. Trouble concentrating. Feeling cold or chills. Feeling weak or tired. Sleepiness and fatigue. Soreness and body aches. These side effects can affect parts of the body that were not involved in surgery. Follow these instructions at home: For the time period you were told by your health care provider:  Rest. Do not participate in activities where you could fall or become injured. Do not drive or use machinery. Do not drink alcohol. Do not take sleeping pills or medicines that cause drowsiness. Do not make important decisions or sign legal documents. Do not take care of children on your own. Eating  and drinking Follow any instructions from your health care provider about eating or drinking restrictions. When you feel hungry, start by eating small amounts of foods that are soft and easy to digest (bland), such as toast. Gradually return to your regular diet. Drink enough fluid to keep your urine pale yellow. If you vomit, rehydrate by drinking water, juice, or clear broth. General instructions If you have sleep apnea, surgery and certain medicines can increase your risk for breathing problems. Follow instructions from your health care provider about wearing your sleep device: Anytime you are sleeping, including during daytime naps. While taking prescription pain medicines, sleeping medicines, or medicines that make you drowsy. Have a responsible adult stay with you for the time you are told. It is important to have someone help care for you until you are awake and alert. Return to your normal activities as told by your health care provider. Ask your health care provider what  activities are safe for you. Take over-the-counter and prescription medicines only as told by your health care provider. If you smoke, do not smoke without supervision. Keep all follow-up visits as told by your health care provider. This is important. Contact a health care provider if: You have nausea or vomiting that does not get better with medicine. You cannot eat or drink without vomiting. You have pain that does not get better with medicine. You are unable to pass urine. You develop a skin rash. You have a fever. You have redness around your IV site that gets worse. Get help right away if: You have difficulty breathing. You have chest pain. You have blood in your urine or stool, or you vomit blood. Summary After the procedure, it is common to have a sore throat or nausea. It is also common to feel tired. Have a responsible adult stay with you for the time you are told. It is important to have someone help  care for you until you are awake and alert. When you feel hungry, start by eating small amounts of foods that are soft and easy to digest (bland), such as toast. Gradually return to your regular diet. Drink enough fluid to keep your urine pale yellow. Return to your normal activities as told by your health care provider. Ask your health care provider what activities are safe for you. This information is not intended to replace advice given to you by your health care provider. Make sure you discuss any questions you have with your health care provider. Document Revised: 10/11/2019 Document Reviewed: 05/10/2019 Elsevier Patient Education  2022 [place]. How to Use Chlorhexidine for Bathing Chlorhexidine gluconate (CHG) is a germ-killing (antiseptic) solution that is used to clean the skin. It can get rid of the bacteria that normally live on the skin and can keep them away for about 24 hours. To clean your skin with CHG, you may be given: A CHG solution to use in the shower or as part of a sponge bath. A prepackaged cloth that contains CHG. Cleaning your skin with CHG may help lower the risk for infection: While you are staying in the intensive care unit of the hospital. If you have a vascular access, such as a central line, to provide short-term or long-term access to your veins. If you have a catheter to drain urine from your bladder. If you are on a ventilator. A ventilator is a machine that helps you breathe by moving air in and out of your lungs. After surgery. What are the risks? Risks of using CHG include: A skin reaction. Hearing loss, if CHG gets in your ears and you have a perforated eardrum. Eye injury, if CHG gets in your eyes and is not rinsed out. The CHG product catching fire. Make sure that you avoid smoking and flames after applying CHG to your skin. Do not use CHG: If you have a chlorhexidine allergy or have previously reacted to chlorhexidine. On babies younger than 48  months of age. How to use CHG solution Use CHG only as told by your health care provider, and follow the instructions on the label. Use the full amount of CHG as directed. Usually, this is one bottle. During a shower Follow these steps when using CHG solution during a shower (unless your health care provider gives you different instructions): Start the shower. Use your normal soap and shampoo to wash your face and hair. Turn off the shower or move out of the shower stream. Pour  the CHG onto a clean washcloth. Do not use any type of brush or rough-edged sponge. Starting at your neck, lather your body down to your toes. Make sure you follow these instructions: If you will be having surgery, pay special attention to the part of your body where you will be having surgery. Scrub this area for at least 1 minute. Do not use CHG on your head or face. If the solution gets into your ears or eyes, rinse them well with water. Avoid your genital area. Avoid any areas of skin that have broken skin, cuts, or scrapes. Scrub your back and under your arms. Make sure to wash skin folds. Let the lather sit on your skin for 1-2 minutes or as long as told by your health care provider. Thoroughly rinse your entire body in the shower. Make sure that all body creases and crevices are rinsed well. Dry off with a clean towel. Do not put any substances on your body afterward--such as powder, lotion, or perfume--unless you are told to do so by your health care provider. Only use lotions that are recommended by the manufacturer. Put on clean clothes or pajamas. If it is the night before your surgery, sleep in clean sheets.  During a sponge bath Follow these steps when using CHG solution during a sponge bath (unless your health care provider gives you different instructions): Use your normal soap and shampoo to wash your face and hair. Pour the CHG onto a clean washcloth. Starting at your neck, lather your body down to  your toes. Make sure you follow these instructions: If you will be having surgery, pay special attention to the part of your body where you will be having surgery. Scrub this area for at least 1 minute. Do not use CHG on your head or face. If the solution gets into your ears or eyes, rinse them well with water. Avoid your genital area. Avoid any areas of skin that have broken skin, cuts, or scrapes. Scrub your back and under your arms. Make sure to wash skin folds. Let the lather sit on your skin for 1-2 minutes or as long as told by your health care provider. Using a different clean, wet washcloth, thoroughly rinse your entire body. Make sure that all body creases and crevices are rinsed well. Dry off with a clean towel. Do not put any substances on your body afterward--such as powder, lotion, or perfume--unless you are told to do so by your health care provider. Only use lotions that are recommended by the manufacturer. Put on clean clothes or pajamas. If it is the night before your surgery, sleep in clean sheets. How to use CHG prepackaged cloths Only use CHG cloths as told by your health care provider, and follow the instructions on the label. Use the CHG cloth on clean, dry skin. Do not use the CHG cloth on your head or face unless your health care provider tells you to. When washing with the CHG cloth: Avoid your genital area. Avoid any areas of skin that have broken skin, cuts, or scrapes. Before surgery Follow these steps when using a CHG cloth to clean before surgery (unless your health care provider gives you different instructions): Using the CHG cloth, vigorously scrub the part of your body where you will be having surgery. Scrub using a back-and-forth motion for 3 minutes. The area on your body should be completely wet with CHG when you are done scrubbing. Do not rinse. Discard the cloth and let the  area air-dry. Do not put any substances on the area afterward, such as powder,  lotion, or perfume. Put on clean clothes or pajamas. If it is the night before your surgery, sleep in clean sheets.  For general bathing Follow these steps when using CHG cloths for general bathing (unless your health care provider gives you different instructions). Use a separate CHG cloth for each area of your body. Make sure you wash between any folds of skin and between your fingers and toes. Wash your body in the following order, switching to a new cloth after each step: The front of your neck, shoulders, and chest. Both of your arms, under your arms, and your hands. Your stomach and groin area, avoiding the genitals. Your right leg and foot. Your left leg and foot. The back of your neck, your back, and your buttocks. Do not rinse. Discard the cloth and let the area air-dry. Do not put any substances on your body afterward--such as powder, lotion, or perfume--unless you are told to do so by your health care provider. Only use lotions that are recommended by the manufacturer. Put on clean clothes or pajamas. Contact a health care provider if: Your skin gets irritated after scrubbing. You have questions about using your solution or cloth. You swallow any chlorhexidine. Call your local poison control center (1-4846833866 in the [place] Get help right away if: Your eyes itch badly, or they become very red or swollen. Your skin itches badly and is red or swollen. Your hearing changes. You have trouble seeing. You have swelling or tingling in your mouth or throat. You have trouble breathing. These symptoms may represent a serious problem that is an emergency. Do not wait to see if the symptoms will go away. Get medical help right away. Call your local emergency services (911 in the [place] Do not drive yourself to the hospital. Summary Chlorhexidine gluconate (CHG) is a germ-killing (antiseptic) solution that is used to clean the skin. Cleaning your skin with CHG may help to lower your  risk for infection. You may be given CHG to use for bathing. It may be in a bottle or in a prepackaged cloth to use on your skin. Carefully follow your health care provider's instructions and the instructions on the product label. Do not use CHG if you have a chlorhexidine allergy. Contact your health care provider if your skin gets irritated after scrubbing. This information is not intended to replace advice given to you by your health care provider. Make sure you discuss any questions you have with your health care provider. Document Revised: 04/07/2020 Document Reviewed: 04/07/2020 Elsevier Patient Education  2022 [employer].

## 2020-11-17 ENCOUNTER — Other Ambulatory Visit: Payer: Self-pay

## 2020-11-17 ENCOUNTER — Encounter (HOSPITAL_COMMUNITY)
Admission: RE | Admit: 2020-11-17 | Discharge: 2020-11-17 | Disposition: A | Discharge status: Home / Self Care | Payer: Medicaid Other | Source: Ambulatory Visit | Attending: General Surgery | Admitting: General Surgery

## 2020-11-17 DIAGNOSIS — Z01812 Encounter for preprocedural laboratory examination: Secondary | ICD-10-CM | POA: Insufficient documentation

## 2020-11-17 LAB — BASIC METABOLIC PANEL
Anion gap: 7 (ref 5–15)
BUN: 13 mg/dL (ref 6–20)
CO2: 27 mmol/L (ref 22–32)
Calcium: 8.8 mg/dL — ABNORMAL LOW (ref 8.9–10.3)
Chloride: 105 mmol/L (ref 98–111)
Creatinine, Ser: 0.88 mg/dL (ref 0.44–1.00)
GFR, Estimated: >60 mL/min (ref >60)
Glucose, Bld: 77 mg/dL (ref 70–99)
Potassium: 3.5 mmol/L (ref 3.5–5.1)
Sodium: 139 mmol/L (ref 135–145)

## 2020-11-17 LAB — CBC WITH DIFFERENTIAL/PLATELET
Abs Immature Granulocytes: 0.04 10*3/uL (ref 0.00–0.07)
Basophils Absolute: 0 10*3/uL (ref 0.0–0.1)
Basophils Relative: 1 %
Eosinophils Absolute: 0.1 10*3/uL (ref 0.0–0.5)
Eosinophils Relative: 2 %
HCT: 38.3 % (ref 36.0–46.0)
Hemoglobin: 12.3 g/dL (ref 12.0–15.0)
Immature Granulocytes: 1 %
Lymphocytes Relative: 18 %
Lymphs Abs: 1.3 10*3/uL (ref 0.7–4.0)
MCH: 30.4 pg (ref 26.0–34.0)
MCHC: 32.1 g/dL (ref 30.0–36.0)
MCV: 94.6 fL (ref 80.0–100.0)
Monocytes Absolute: 0.7 10*3/uL (ref 0.1–1.0)
Monocytes Relative: 9 %
Neutro Abs: 4.9 10*3/uL (ref 1.7–7.7)
Neutrophils Relative %: 69 %
Platelets: 179 10*3/uL (ref 150–400)
RBC: 4.05 MIL/uL (ref 3.87–5.11)
RDW: 13.7 % (ref 11.5–15.5)
WBC: 7.1 10*3/uL (ref 4.0–10.5)
nRBC: 0 % (ref 0.0–0.2)

## 2020-11-17 LAB — PREGNANCY, URINE: Preg Test, Ur: NEGATIVE

## 2020-11-19 ENCOUNTER — Ambulatory Visit (HOSPITAL_COMMUNITY)
Admission: RE | Admit: 2020-11-19 | Discharge: 2020-11-19 | Disposition: A | Discharge status: Home / Self Care | Payer: Medicaid Other | Source: Ambulatory Visit | Attending: General Surgery | Admitting: General Surgery

## 2020-11-19 ENCOUNTER — Other Ambulatory Visit: Payer: Self-pay | Admitting: Nurse Practitioner

## 2020-11-19 ENCOUNTER — Ambulatory Visit (HOSPITAL_COMMUNITY): Payer: Medicaid Other | Admitting: Certified Registered"

## 2020-11-19 ENCOUNTER — Encounter (HOSPITAL_COMMUNITY)
Admission: RE | Disposition: A | Discharge status: Home / Self Care | Payer: Self-pay | Source: Ambulatory Visit | Attending: General Surgery

## 2020-11-19 ENCOUNTER — Other Ambulatory Visit: Payer: Self-pay

## 2020-11-19 ENCOUNTER — Encounter (HOSPITAL_COMMUNITY): Payer: Self-pay | Admitting: General Surgery

## 2020-11-19 DIAGNOSIS — Z803 Family history of malignant neoplasm of breast: Secondary | ICD-10-CM | POA: Insufficient documentation

## 2020-11-19 DIAGNOSIS — D171 Benign lipomatous neoplasm of skin and subcutaneous tissue of trunk: Secondary | ICD-10-CM | POA: Diagnosis not present

## 2020-11-19 DIAGNOSIS — F17210 Nicotine dependence, cigarettes, uncomplicated: Secondary | ICD-10-CM | POA: Diagnosis not present

## 2020-11-19 DIAGNOSIS — D170 Benign lipomatous neoplasm of skin and subcutaneous tissue of head, face and neck: Secondary | ICD-10-CM | POA: Diagnosis not present

## 2020-11-19 DIAGNOSIS — Z841 Family history of disorders of kidney and ureter: Secondary | ICD-10-CM | POA: Diagnosis not present

## 2020-11-19 DIAGNOSIS — F32A Depression, unspecified: Secondary | ICD-10-CM

## 2020-11-19 DIAGNOSIS — Z8249 Family history of ischemic heart disease and other diseases of the circulatory system: Secondary | ICD-10-CM | POA: Insufficient documentation

## 2020-11-19 DIAGNOSIS — F411 Generalized anxiety disorder: Secondary | ICD-10-CM

## 2020-11-19 HISTORY — PX: LIPOMA EXCISION: SHX5283

## 2020-11-19 SURGERY — EXCISION LIPOMA
Anesthesia: General | Site: Flank | Laterality: Right

## 2020-11-19 MED ORDER — KETAMINE HCL 10 MG/ML IJ SOLN
INTRAMUSCULAR | Status: DC | PRN
  Administered 2020-11-19: 20 mg via INTRAVENOUS

## 2020-11-19 MED ORDER — LIDOCAINE HCL 1 % IJ SOLN
INTRAMUSCULAR | Status: DC | PRN
  Administered 2020-11-19: 10 mL

## 2020-11-19 MED ORDER — ONDANSETRON HCL 4 MG PO TABS: 4.0000 mg | ORAL_TABLET | Freq: Three times a day (TID) | ORAL | 1 refills | Status: AC | PRN

## 2020-11-19 MED ORDER — PHENYLEPHRINE HCL (PRESSORS) 10 MG/ML IV SOLN
INTRAVENOUS | Status: DC | PRN
  Administered 2020-11-19 (×2): 100 ug via INTRAVENOUS

## 2020-11-19 MED ORDER — EPHEDRINE 5 MG/ML INJ
INTRAVENOUS | Status: AC
  Filled 2020-11-19: qty 5

## 2020-11-19 MED ORDER — KETOROLAC TROMETHAMINE 30 MG/ML IJ SOLN
INTRAMUSCULAR | Status: AC
  Filled 2020-11-19: qty 1

## 2020-11-19 MED ORDER — MIDAZOLAM HCL 2 MG/2ML IJ SOLN
INTRAMUSCULAR | Status: AC
  Filled 2020-11-19: qty 2

## 2020-11-19 MED ORDER — CHLORHEXIDINE GLUCONATE 0.12 % MT SOLN: 15.0000 mL | Freq: Once | OROMUCOSAL | Status: AC

## 2020-11-19 MED ORDER — LACTATED RINGERS IV SOLN: INTRAVENOUS | Status: DC

## 2020-11-19 MED ORDER — CHLORHEXIDINE GLUCONATE CLOTH 2 % EX PADS: 6.0000 | MEDICATED_PAD | Freq: Once | CUTANEOUS | Status: DC

## 2020-11-19 MED ORDER — GLYCOPYRROLATE 0.2 MG/ML IJ SOLN
INTRAMUSCULAR | Status: DC | PRN
  Administered 2020-11-19: .1 mg via INTRAVENOUS

## 2020-11-19 MED ORDER — FENTANYL CITRATE (PF) 100 MCG/2ML IJ SOLN
INTRAMUSCULAR | Status: AC
  Filled 2020-11-19: qty 2

## 2020-11-19 MED ORDER — SODIUM CHLORIDE 0.9 % IR SOLN
Status: DC | PRN
  Administered 2020-11-19: 1000 mL

## 2020-11-19 MED ORDER — PROPOFOL 10 MG/ML IV BOLUS
INTRAVENOUS | Status: DC | PRN
  Administered 2020-11-19: 200 mg via INTRAVENOUS

## 2020-11-19 MED ORDER — ONDANSETRON HCL 4 MG/2ML IJ SOLN: 4.0000 mg | Freq: Once | INTRAMUSCULAR | Status: DC | PRN

## 2020-11-19 MED ORDER — LIDOCAINE HCL (PF) 2 % IJ SOLN
INTRAMUSCULAR | Status: AC
  Filled 2020-11-19: qty 5

## 2020-11-19 MED ORDER — ONDANSETRON HCL 4 MG/2ML IJ SOLN
INTRAMUSCULAR | Status: AC
  Filled 2020-11-19: qty 2

## 2020-11-19 MED ORDER — PROPOFOL 10 MG/ML IV BOLUS
INTRAVENOUS | Status: AC
  Filled 2020-11-19: qty 20

## 2020-11-19 MED ORDER — KETAMINE HCL 50 MG/5ML IJ SOSY
PREFILLED_SYRINGE | INTRAMUSCULAR | Status: AC
  Filled 2020-11-19: qty 5

## 2020-11-19 MED ORDER — CHLORHEXIDINE GLUCONATE 0.12 % MT SOLN
OROMUCOSAL | Status: AC
  Administered 2020-11-19: 15 mL via OROMUCOSAL
  Filled 2020-11-19: qty 15

## 2020-11-19 MED ORDER — DEXAMETHASONE SODIUM PHOSPHATE 10 MG/ML IJ SOLN
INTRAMUSCULAR | Status: AC
  Filled 2020-11-19: qty 1

## 2020-11-19 MED ORDER — FENTANYL CITRATE PF 50 MCG/ML IJ SOSY
25.0000 ug | PREFILLED_SYRINGE | INTRAMUSCULAR | Status: DC | PRN
  Administered 2020-11-19: 50 ug via INTRAVENOUS
  Filled 2020-11-19: qty 1

## 2020-11-19 MED ORDER — PHENYLEPHRINE 40 MCG/ML (10ML) SYRINGE FOR IV PUSH (FOR BLOOD PRESSURE SUPPORT)
PREFILLED_SYRINGE | INTRAVENOUS | Status: AC
  Filled 2020-11-19: qty 10

## 2020-11-19 MED ORDER — LIDOCAINE HCL (CARDIAC) PF 100 MG/5ML IV SOSY
PREFILLED_SYRINGE | INTRAVENOUS | Status: DC | PRN
  Administered 2020-11-19: 100 mg via INTRAVENOUS

## 2020-11-19 MED ORDER — MIDAZOLAM HCL 5 MG/5ML IJ SOLN
INTRAMUSCULAR | Status: DC | PRN
  Administered 2020-11-19: 2 mg via INTRAVENOUS

## 2020-11-19 MED ORDER — EPHEDRINE SULFATE 50 MG/ML IJ SOLN
INTRAMUSCULAR | Status: DC | PRN
  Administered 2020-11-19: 10 mg via INTRAVENOUS

## 2020-11-19 MED ORDER — VANCOMYCIN HCL IN DEXTROSE 1-5 GM/200ML-% IV SOLN
INTRAVENOUS | Status: AC
  Administered 2020-11-19: 1000 mg via INTRAVENOUS
  Filled 2020-11-19: qty 200

## 2020-11-19 MED ORDER — TRAZODONE HCL 100 MG PO TABS: 200.0000 mg | ORAL_TABLET | Freq: Every day | ORAL | 1 refills | Status: DC

## 2020-11-19 MED ORDER — GLYCOPYRROLATE PF 0.2 MG/ML IJ SOSY
PREFILLED_SYRINGE | INTRAMUSCULAR | Status: AC
  Filled 2020-11-19: qty 1

## 2020-11-19 MED ORDER — DEXAMETHASONE SODIUM PHOSPHATE 4 MG/ML IJ SOLN
INTRAMUSCULAR | Status: DC | PRN
  Administered 2020-11-19: 8 mg via INTRAVENOUS

## 2020-11-19 MED ORDER — MEPERIDINE HCL 50 MG/ML IJ SOLN: 6.2500 mg | INTRAMUSCULAR | Status: DC | PRN

## 2020-11-19 MED ORDER — ORAL CARE MOUTH RINSE: 15.0000 mL | Freq: Once | OROMUCOSAL | Status: AC

## 2020-11-19 MED ORDER — OXYCODONE HCL 5 MG PO TABS: 5.0000 mg | ORAL_TABLET | ORAL | 0 refills | Status: DC | PRN

## 2020-11-19 MED ORDER — BUPIVACAINE HCL (PF) 0.5 % IJ SOLN
INTRAMUSCULAR | Status: AC
  Filled 2020-11-19: qty 30

## 2020-11-19 MED ORDER — KETOROLAC TROMETHAMINE 30 MG/ML IJ SOLN
30.0000 mg | Freq: Once | INTRAMUSCULAR | Status: AC
  Administered 2020-11-19: 30 mg via INTRAVENOUS

## 2020-11-19 MED ORDER — ONDANSETRON HCL 4 MG/2ML IJ SOLN
INTRAMUSCULAR | Status: DC | PRN
  Administered 2020-11-19: 4 mg via INTRAVENOUS

## 2020-11-19 MED ORDER — LIDOCAINE HCL (PF) 1 % IJ SOLN
INTRAMUSCULAR | Status: AC
  Filled 2020-11-19: qty 30

## 2020-11-19 MED ORDER — VANCOMYCIN HCL IN DEXTROSE 1-5 GM/200ML-% IV SOLN: 1000.0000 mg | INTRAVENOUS | Status: AC

## 2020-11-19 MED ORDER — FENTANYL CITRATE (PF) 100 MCG/2ML IJ SOLN
INTRAMUSCULAR | Status: DC | PRN
  Administered 2020-11-19 (×2): 50 ug via INTRAVENOUS

## 2020-11-19 SURGICAL SUPPLY — 28 items
APL PRP STRL LF DISP 70% ISPRP (MISCELLANEOUS) ×1
CHLORAPREP W/TINT 26 (MISCELLANEOUS) ×2 IMPLANT
CLOTH BEACON ORANGE TIMEOUT ST (SAFETY) ×2 IMPLANT
COVER LIGHT HANDLE STERIS (MISCELLANEOUS) ×4 IMPLANT
DRSG TEGADERM 4X4.75 (GAUZE/BANDAGES/DRESSINGS) ×2 IMPLANT
ELECT NEEDLE TIP 2.8 STRL (NEEDLE) ×2 IMPLANT
ELECT REM PT RETURN 9FT ADLT (ELECTROSURGICAL) ×2
ELECTRODE REM PT RTRN 9FT ADLT (ELECTROSURGICAL) ×1 IMPLANT
GAUZE 4X4 16PLY ~~LOC~~+RFID DBL (SPONGE) ×2 IMPLANT
GAUZE SPONGE 2X2 8PLY STRL LF (GAUZE/BANDAGES/DRESSINGS) ×2 IMPLANT
GLOVE SURG ENC MOIS LTX SZ6.5 (GLOVE) ×2 IMPLANT
GLOVE SURG UNDER POLY LF SZ6.5 (GLOVE) ×2 IMPLANT
GLOVE SURG UNDER POLY LF SZ7 (GLOVE) ×2 IMPLANT
GOWN STRL REUS W/TWL LRG LVL3 (GOWN DISPOSABLE) ×4 IMPLANT
KIT TURNOVER KIT A (KITS) ×2 IMPLANT
MANIFOLD NEPTUNE II (INSTRUMENTS) ×2 IMPLANT
NEEDLE HYPO 25X1 1.5 SAFETY (NEEDLE) ×2 IMPLANT
NS IRRIG 1000ML POUR BTL (IV SOLUTION) ×2 IMPLANT
PACK MINOR (CUSTOM PROCEDURE TRAY) ×2 IMPLANT
PAD ARMBOARD 7.5X6 YLW CONV (MISCELLANEOUS) ×2 IMPLANT
SET BASIN LINEN APH (SET/KITS/TRAYS/PACK) ×2 IMPLANT
SPONGE GAUZE 2X2 8PLY STRL LF (GAUZE/BANDAGES/DRESSINGS) ×2 IMPLANT
SPONGE GAUZE 2X2 STER 10/PKG (GAUZE/BANDAGES/DRESSINGS) ×2
SUT ETHILON 3 0 FSL (SUTURE) ×2 IMPLANT
SUT VIC AB 3-0 SH 27 (SUTURE) ×2
SUT VIC AB 3-0 SH 27X BRD (SUTURE) ×1 IMPLANT
SYR BULB IRRIG 60ML STRL (SYRINGE) ×2 IMPLANT
SYR CONTROL 10ML LL (SYRINGE) ×2 IMPLANT

## 2020-11-19 NOTE — Progress Notes (Signed)
[facility]  Updated fiance, and wrote him a work note for today. Rx to [facility]. Will tak out sutures in a 10-14 days.  Curlene Labrum, MD [facility] [address] [place], [place] [id number] [phone] (office)

## 2020-11-19 NOTE — Anesthesia Preprocedure Evaluation (Addendum)
Anesthesia Evaluation  Patient identified by MRN, date of birth, ID band Patient awake    Reviewed: Allergy & Precautions, NPO status , Patient's Chart, lab work & pertinent test results  History of Anesthesia Complications Negative for: history of anesthetic complications  Airway Mallampati: II  TM Distance: >3 FB Neck ROM: Full   Comment: Cervical disc arthroplasty Dental  (+) Dental Advisory Given, Missing, Chipped   Pulmonary asthma , sleep apnea (patient denied having OSA) , pneumonia, Current Smoker and Patient abstained from smoking.,    Pulmonary exam normal breath sounds clear to auscultation       Cardiovascular Exercise Tolerance: Good Normal cardiovascular exam Rhythm:Regular Rate:Normal  Left ventricle: The cavity size was normal. Wall thickness was  normal. Systolic function was normal. The estimated ejection  fraction was in the range of 50% to 55%. Wall motion was normal;  there were no regional wall motion abnormalities. Left  ventricular diastolic function parameters were normal.  - Aortic valve: Valve area (VTI): 2.14 cm^2. Valve area (Vmax):  1.92 cm^2. Valve area (Vmean): 2.15 cm^2.  - Technically adequate study.    Neuro/Psych  Headaches, PSYCHIATRIC DISORDERS Anxiety Depression Bipolar Disorder  Neuromuscular disease    GI/Hepatic GERD  Medicated and Controlled,(+)     substance abuse  alcohol use,   Endo/Other  negative endocrine ROS  Renal/GU Renal disease     Musculoskeletal  (+) Arthritis  (back pain, neck pain , cervical radiculopathy),   Abdominal   Peds  (+) ADHD Hematology  (+) anemia ,   Anesthesia Other Findings 26-Mar-2020 14:48:10 [facility] ROUTINE RECORD Normal sinus rhythm Low voltage QRS Borderline ECG Confirmed by Asencion Noble [phone]) on 03/28/2020 6:47:01 AM  Reproductive/Obstetrics                              Anesthesia Physical  Anesthesia Plan  ASA: 2  Anesthesia Plan: General   Post-op Pain Management:    Induction: Intravenous  PONV Risk Score and Plan: Ondansetron, Dexamethasone and Midazolam  Airway Management Planned: LMA  Additional Equipment:   Intra-op Plan:   Post-operative Plan: Extubation in OR  Informed Consent: I have reviewed the patients History and Physical, chart, labs and discussed the procedure including the risks, benefits and alternatives for the proposed anesthesia with the patient or authorized representative who has indicated his/her understanding and acceptance.     Dental advisory given  Plan Discussed with:   Anesthesia Plan Comments:        Anesthesia Quick Evaluation

## 2020-11-19 NOTE — Discharge Instructions (Signed)
Discharge Instructions:  Common Complaints: Pain at the incision site is common. Bruising is common.  Some nausea is common and poor appetite after surgery. The main goal is to stay hydrated the first few days after surgery.   Diet/ Activity: Diet as tolerated. You may not have an appetite, but it is important to stay hydrated. Drink 64 ounces of water a day. Your appetite will return with time.  Shower per your regular routine daily.  Do not take hot showers. Take warm showers that are less than 10 minutes. Walk everyday for at least 15-20 minutes. Deep cough and move around every 1-2 hours in the first few days after surgery.  Limit stretching, pulling on your incision if it is located on other parts of your body.  Remove your bandage after 2 days. You can replace as needed. It is ok to shower and get the area wet. Pat it dry.  You can cover and place neosporin on the area as desired daily after removing the bandage.   Medication: Take tylenol and ibuprofen as needed for pain control, alternating every 4-6 hours.  Example:  Tylenol 1000mg  @ 6am, 12noon, 6pm, 63midnight (Do not exceed 4000mg  of tylenol a day). Ibuprofen 800mg  @ 9am, 3pm, 9pm, 3am (Do not exceed 3600mg  of ibuprofen a day).  Take Roxicodone for breakthrough pain every 4 hours.  Take Colace for constipation related to narcotic pain medication. If you do not have a bowel movement in 2 days, take Miralax over the counter.  Drink plenty of water to also prevent constipation.   Contact Information: If you have questions or concerns, please call our office, [phone], Monday- Thursday 8AM-5PM and Friday 8AM-12Noon.  If it is after hours or on the weekend, please call [facility] Main Number, [phone], [phone],  and ask to speak to the surgeon on call for Dr. Constance Haw at [facility].

## 2020-11-19 NOTE — Anesthesia Procedure Notes (Signed)
Procedure Name: LMA Insertion Date/Time: 11/19/2020 8:23 AM Performed by: Tacy Learn, CRNA Pre-anesthesia Checklist: Patient identified, Emergency Drugs available, Suction available, Patient being monitored and Timeout performed Patient Re-evaluated:Patient Re-evaluated prior to induction Oxygen Delivery Method: Circle system utilized Preoxygenation: Pre-oxygenation with 100% oxygen Induction Type: IV induction LMA: LMA inserted LMA Size: 3.0 Number of attempts: 1 Placement Confirmation: positive ETCO2, CO2 detector and breath sounds checked- equal and bilateral Tube secured with: Tape Dental Injury: Teeth and Oropharynx as per pre-operative assessment

## 2020-11-19 NOTE — Transfer of Care (Signed)
Immediate Anesthesia Transfer of Care Note  Patient: Kellie Martinez  Procedure(s) Performed: EXCISION LIPOMA (Right: Flank)  Patient Location: PACU  Anesthesia Type:General  Level of Consciousness: drowsy, patient cooperative and responds to stimulation  Airway & Oxygen Therapy: Patient Spontanous Breathing  Post-op Assessment: Report given to RN, Post -op Vital signs reviewed and stable and Patient moving all extremities X 4  Post vital signs: Reviewed and stable  Last Vitals:  Vitals Value Taken Time  BP    Temp    Pulse 109 11/19/20 0902  Resp 25 11/19/20 0902  SpO2 99 % 11/19/20 0902  Vitals shown include unvalidated device data.  Last Pain:  Vitals:   11/19/20 0712  TempSrc: Oral  PainSc: 6          Complications: No notable events documented.

## 2020-11-19 NOTE — Op Note (Signed)
[facility] Operative Note  11/19/20  Preoperative Diagnosis: Lipoma right flank    Postoperative Diagnosis: Same   Procedure(s) Performed: Excision of lipoma right flank 4cm    Surgeon: Lanell Matar. Constance Haw, MD   Assistants: No qualified resident was available    Anesthesia: General endotracheal   Anesthesiologist: Dr. Charna Elizabeth    Specimens: Lipoma   Estimated Blood Loss: Minimal   Blood Replacement: None    Complications: None   Wound Class: Clean    Operative Indications: Ms. Bruney is a 25 yo who comes in with a lipoma on the right flank she wants removed. Discussed excision and risk of bleeding, infection, recurrence. She opted to proceed.   Findings:Superficial 4cm lipoma    Procedure: The patient was taken to the operating room and placed supine. Anesthesia was induced. Intravenous antibiotics were administered per protocol.  She was positioned left lateral decubitus and the right flank was prepped and draped.   An incision was made in the natural skin lines and carried down to the fatty tissue. With sharp and cautery dissection the lipoma was freed from the subcutaneous tissue and removed from the cavity.  The cavity was made hemostatic. Deep 3-0 interrupted Vicryls were placed. Nylon 3-0 Interrupted sutures closed the skin given movement and tension in this area. A sterile dressing was placed.   Final inspection revealed acceptable hemostasis. All counts were correct at the end of the case. The patient was awakened from anesthesia without complication.  The patient went to the PACU in stable condition.   Curlene Labrum, MD [facility] [address] [place], [place] [address] [phone] (office)

## 2020-11-19 NOTE — Anesthesia Postprocedure Evaluation (Signed)
Anesthesia Post Note  Patient: Kellie Martinez  Procedure(s) Performed: EXCISION LIPOMA (Right: Flank)  Patient location during evaluation: PACU Anesthesia Type: General Level of consciousness: awake and alert and oriented Pain management: pain level controlled Vital Signs Assessment: post-procedure vital signs reviewed and stable Respiratory status: spontaneous breathing and respiratory function stable Cardiovascular status: blood pressure returned to baseline and stable Postop Assessment: no apparent nausea or vomiting Anesthetic complications: no   No notable events documented.   Last Vitals:  Vitals:   11/19/20 0930 11/19/20 0942  BP: 115/63 117/78  Pulse: 90 82  Resp: 20 18  Temp:  36.6 C  SpO2: 100% 100%    Last Pain:  Vitals:   11/19/20 0942  TempSrc: Oral  PainSc: 10-Worst pain ever                 Sudais Banghart C Toccara Alford

## 2020-11-19 NOTE — Interval H&P Note (Signed)
History and Physical Interval Note:  11/19/2020 8:03 AM  Kellie Martinez  has presented today for surgery, with the diagnosis of Lipoma; 4cm; left flank.  The various methods of treatment have been discussed with the patient and family. After consideration of risks, benefits and other options for treatment, the patient has consented to  Procedure(s): EXCISION LIPOMA (Right) as a surgical intervention.  The patient's history has been reviewed, patient examined, no change in status, stable for surgery.  I have reviewed the patient's chart and labs.  Questions were answered to the patient's satisfaction.    Area marked Virl Cagey

## 2020-11-19 NOTE — Interval H&P Note (Signed)
History and Physical Interval Note:  11/19/2020 9:11 AM  Kellie Martinez  has presented today for surgery, with the diagnosis of Lipoma; 4cm; right flank.  The various methods of treatment have been discussed with the patient and family. After consideration of risks, benefits and other options for treatment, the patient has consented to  Procedure(s): EXCISION LIPOMA (Right) as a surgical intervention.  The patient's history has been reviewed, patient examined, no change in status, stable for surgery.  I have reviewed the patient's chart and labs.  Questions were answered to the patient's satisfaction.     Virl Cagey  This was on patient's right flank. Right Flank was marked and patient agreed right flank.

## 2020-11-20 ENCOUNTER — Encounter (HOSPITAL_COMMUNITY): Payer: Self-pay | Admitting: General Surgery

## 2020-11-20 LAB — SURGICAL PATHOLOGY

## 2020-11-24 ENCOUNTER — Telehealth (INDEPENDENT_AMBULATORY_CARE_PROVIDER_SITE_OTHER): Payer: Medicaid Other | Admitting: General Surgery

## 2020-11-24 DIAGNOSIS — D171 Benign lipomatous neoplasm of skin and subcutaneous tissue of trunk: Secondary | ICD-10-CM

## 2020-11-24 MED ORDER — OXYCODONE HCL 5 MG PO TABS: 5.0000 mg | ORAL_TABLET | ORAL | 0 refills | Status: AC | PRN

## 2020-11-24 NOTE — Telephone Encounter (Signed)
[facility]  One time refill roxicodone.  Curlene Labrum, MD [facility] [address] [place], [place] [address] [phone] (office)

## 2020-11-26 ENCOUNTER — Encounter: Payer: Medicaid Other | Admitting: Advanced Practice Midwife

## 2020-12-02 ENCOUNTER — Encounter: Payer: Medicaid Other | Admitting: General Surgery

## 2020-12-09 ENCOUNTER — Encounter: Payer: Self-pay | Admitting: Nurse Practitioner

## 2020-12-10 ENCOUNTER — Other Ambulatory Visit: Payer: Self-pay | Admitting: Nurse Practitioner

## 2020-12-10 DIAGNOSIS — M50122 Cervical disc disorder at C5-C6 level with radiculopathy: Secondary | ICD-10-CM

## 2020-12-16 ENCOUNTER — Other Ambulatory Visit: Payer: Self-pay

## 2020-12-16 ENCOUNTER — Encounter: Payer: Self-pay | Admitting: General Surgery

## 2020-12-16 ENCOUNTER — Ambulatory Visit (INDEPENDENT_AMBULATORY_CARE_PROVIDER_SITE_OTHER): Payer: Medicaid Other | Admitting: General Surgery

## 2020-12-16 VITALS — BP 115/66 | HR 70 | Temp 97.9°F | Resp 16 | Ht 62.0 in | Wt 200.0 lb

## 2020-12-16 DIAGNOSIS — D171 Benign lipomatous neoplasm of skin and subcutaneous tissue of trunk: Secondary | ICD-10-CM

## 2020-12-16 MED ORDER — IBUPROFEN 200 MG PO TABS: 800.0000 mg | ORAL_TABLET | Freq: Four times a day (QID) | ORAL | 1 refills | Status: DC | PRN

## 2020-12-16 NOTE — Patient Instructions (Addendum)
Take 800 mg of ibuprofen (motrin) every 6 hours as needed for pain.  Take with food to prevent stomach upset and irritation.  Follow up as needed

## 2020-12-16 NOTE — Progress Notes (Signed)
[facility]  Sutures came out patient says. She missed her appt 10/25 to have them removed from the right flank.  Still with some soreness.   BP 115/66   Pulse 70   Temp 97.9 F (36.6 C) (Other (Comment))   Resp 16   Ht 5\' 2"  (1.575 m)   Wt 200 lb (90.7 kg)   SpO2 97%   BMI 36.58 kg/m  Incision healing well. No erythema or drainage.  Pathology: FINAL MICROSCOPIC DIAGNOSIS:   A. LIPOMA, RIGHT FLANK, EXCISION:  - Lipoma.   Patient s/p lipoma excision on right flank. Doing well. Do not recommend any more narcotics for this procedure.   Take 800 mg of ibuprofen (motrin) every 6 hours as needed for pain. (Rx sent to pharmacy)  Take with food to prevent stomach upset and irritation.  Follow up as needed   Curlene Labrum, MD [facility] [address] [place], [place] [address] [phone] (office)

## 2020-12-22 DIAGNOSIS — R059 Cough, unspecified: Secondary | ICD-10-CM | POA: Diagnosis not present

## 2020-12-22 DIAGNOSIS — J020 Streptococcal pharyngitis: Secondary | ICD-10-CM | POA: Diagnosis not present

## 2020-12-22 DIAGNOSIS — R0781 Pleurodynia: Secondary | ICD-10-CM | POA: Diagnosis not present

## 2020-12-24 ENCOUNTER — Encounter: Payer: Medicaid Other | Admitting: Advanced Practice Midwife

## 2020-12-26 ENCOUNTER — Encounter: Payer: Self-pay | Admitting: Nurse Practitioner

## 2020-12-26 NOTE — Telephone Encounter (Unsigned)
Copied from [place] [id number]. Topic: General - Other >> Dec 26, 2020  1:46 PM Yvette Rack wrote: Reason for CRM: [employer] stated pt came in and stated a referral was sent to them however they have not received it so they are requesting that the referral be sent to them. Fax# [phone]

## 2020-12-27 NOTE — Telephone Encounter (Signed)
Kellie Martinez can you take care of this please

## 2021-01-06 ENCOUNTER — Telehealth: Payer: Self-pay | Admitting: Nurse Practitioner

## 2021-01-06 NOTE — Telephone Encounter (Signed)
Copied from [place] [id number]. Topic: Referral - Request for Referral >> Dec 30, 2020  1:22 PM Erick Blinks wrote: Has patient seen PCP for this complaint? Yes. *If NO, is insurance requiring patient see PCP for this issue before PCP can refer them? Referral for which specialty: Behavioral Health Preferred provider/office: [facility]  Reason for referral: Needs new psychiatrist

## 2021-01-11 ENCOUNTER — Other Ambulatory Visit: Payer: Self-pay | Admitting: Nurse Practitioner

## 2021-01-11 NOTE — Telephone Encounter (Signed)
Requested Prescriptions  Pending Prescriptions Disp Refills  . traZODone (DESYREL) 100 MG tablet [Pharmacy Med Name: trazodone 100 mg tablet] 90 tablet 1    Sig: TAKE 2 TABLETS BY MOUTH AT BEDTIME     Psychiatry: Antidepressants - Serotonin Modulator Passed - 01/11/2021  8:01 AM      Passed - Valid encounter within last 6 months    Recent Outpatient Visits          2 months ago Anxiety and depression   [place], Vernia Buff, NP   11 months ago Umbilical hernia without obstruction and without gangrene   [place], Vernia Buff, NP   1 year ago Encounter for Papanicolaou smear for cervical cancer screening   [place], Vernia Buff, NP   1 year ago Financial planner, neurotic   [place], Vernia Buff, NP

## 2021-01-15 DIAGNOSIS — M961 Postlaminectomy syndrome, not elsewhere classified: Secondary | ICD-10-CM | POA: Diagnosis not present

## 2021-01-15 DIAGNOSIS — Z79891 Long term (current) use of opiate analgesic: Secondary | ICD-10-CM | POA: Diagnosis not present

## 2021-01-15 DIAGNOSIS — G959 Disease of spinal cord, unspecified: Secondary | ICD-10-CM | POA: Diagnosis not present

## 2021-01-15 DIAGNOSIS — M545 Low back pain, unspecified: Secondary | ICD-10-CM | POA: Diagnosis not present

## 2021-01-15 DIAGNOSIS — M797 Fibromyalgia: Secondary | ICD-10-CM | POA: Diagnosis not present

## 2021-01-27 DIAGNOSIS — F3132 Bipolar disorder, current episode depressed, moderate: Secondary | ICD-10-CM | POA: Diagnosis not present

## 2021-01-27 DIAGNOSIS — F411 Generalized anxiety disorder: Secondary | ICD-10-CM | POA: Diagnosis not present

## 2021-01-27 DIAGNOSIS — F900 Attention-deficit hyperactivity disorder, predominantly inattentive type: Secondary | ICD-10-CM | POA: Diagnosis not present

## 2021-01-27 DIAGNOSIS — F410 Panic disorder [episodic paroxysmal anxiety] without agoraphobia: Secondary | ICD-10-CM | POA: Diagnosis not present

## 2021-01-27 DIAGNOSIS — Z79899 Other long term (current) drug therapy: Secondary | ICD-10-CM | POA: Diagnosis not present

## 2021-01-30 ENCOUNTER — Encounter: Payer: Self-pay | Admitting: Nurse Practitioner

## 2021-02-10 ENCOUNTER — Encounter: Payer: Self-pay | Admitting: Nurse Practitioner

## 2021-02-17 DIAGNOSIS — F411 Generalized anxiety disorder: Secondary | ICD-10-CM | POA: Diagnosis not present

## 2021-02-17 DIAGNOSIS — F410 Panic disorder [episodic paroxysmal anxiety] without agoraphobia: Secondary | ICD-10-CM | POA: Diagnosis not present

## 2021-02-17 DIAGNOSIS — F900 Attention-deficit hyperactivity disorder, predominantly inattentive type: Secondary | ICD-10-CM | POA: Diagnosis not present

## 2021-02-17 DIAGNOSIS — F3132 Bipolar disorder, current episode depressed, moderate: Secondary | ICD-10-CM | POA: Diagnosis not present

## 2021-03-20 ENCOUNTER — Encounter: Payer: Self-pay | Admitting: Nurse Practitioner

## 2021-03-24 ENCOUNTER — Telehealth (HOSPITAL_BASED_OUTPATIENT_CLINIC_OR_DEPARTMENT_OTHER): Payer: Medicaid Other | Admitting: Nurse Practitioner

## 2021-03-24 ENCOUNTER — Encounter: Payer: Self-pay | Admitting: Nurse Practitioner

## 2021-03-24 DIAGNOSIS — F900 Attention-deficit hyperactivity disorder, predominantly inattentive type: Secondary | ICD-10-CM | POA: Diagnosis not present

## 2021-03-24 DIAGNOSIS — N393 Stress incontinence (female) (male): Secondary | ICD-10-CM | POA: Diagnosis not present

## 2021-03-24 DIAGNOSIS — F3132 Bipolar disorder, current episode depressed, moderate: Secondary | ICD-10-CM | POA: Diagnosis not present

## 2021-03-24 DIAGNOSIS — F410 Panic disorder [episodic paroxysmal anxiety] without agoraphobia: Secondary | ICD-10-CM | POA: Diagnosis not present

## 2021-03-24 DIAGNOSIS — F411 Generalized anxiety disorder: Secondary | ICD-10-CM | POA: Diagnosis not present

## 2021-03-24 NOTE — Progress Notes (Addendum)
Virtual Visit via Telephone Note Due to national recommendations of social distancing due to [place] 19, telehealth visit is felt to be most appropriate for this patient at this time.  I discussed the limitations, risks, security and privacy concerns of performing an evaluation and management service by telephone and the availability of in person appointments. I also discussed with the patient that there may be a patient responsible charge related to this service. The patient expressed understanding and agreed to proceed.    I connected with Kellie Martinez on 03/24/21  at   3:10 PM EST  EDT by telephone and verified that I am speaking with the correct person using two identifiers.  Location of Patient: Private Residence   Location of Provider: [place] and [place] participating in Telemedicine visit: [name] FNP-BC Kellie Martinez    History of Present Illness: Telemedicine visit for: Stress incontinence Patient complains of urinary incontinence. This has been present for 8 years since the birth of her first child.  She leaks urine with coughing, lifting, sneezing, exercise, strenuous activities. Patient describes the symptoms as  urine leakage with coughing/heavy physical activity and urine leaking unpredictably.  Factors associated with symptoms include none known.Treatment to date includes none.  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   Chronic kidney disease    Depression    GERD (gastroesophageal reflux disease)    Headache    otc med prn   History of kidney stones    HLD (hyperlipidemia)    diet controlled   Molar pregnancy    Pneumonia    x 1 yrs ago per patient 11/22/19   Renal calculus or stone    had stent/ lithotripsy in past   Sleep apnea    does not use cpap   Substance abuse ([place])    Suicide attempt ([place])     x 2    Past Surgical History:  Procedure Laterality Date   CERVICAL DISC ARTHROPLASTY N/A 11/26/2019   Procedure: CERVICAL DISC ARTHROPLASTY C5-6 WITH PRO [place];  Surgeon: Jessy Oto, MD;  Location: [place];  Service: Orthopedics;  Laterality: N/A;   CYSTOSCOPY     x2 with stone extraction   EXTRACORPOREAL SHOCK WAVE LITHOTRIPSY Right 06/20/2013   Procedure: EXTRACORPOREAL SHOCK WAVE LITHOTRIPSY (ESWL);  Surgeon: Edwin Dada, MD;  Location: [facility];  Service: Urology;  Laterality: Right;   FOOT SURGERY Left 2003   LIPOMA EXCISION Right 11/19/2020   Procedure: EXCISION LIPOMA;  Surgeon: Virl Cagey, MD;  Location: [facility];  Service: General;  Laterality: Right;   LITHOTRIPSY     TUBAL LIGATION  2017   TUBAL LIGATION     UMBILICAL HERNIA REPAIR N/A 03/28/2020   Procedure: HERNIA REPAIR UMBILICAL ADULT;  Surgeon: Virl Cagey, MD;  Location: [facility];  Service: General;  Laterality: N/A;    Family History  Problem Relation Age of Onset   Depression Mother    Asthma Mother    Hypertension Mother    Mood Disorder Mother    Heart disease Father    Kidney disease Father    Alcoholism Father    Breast cancer Paternal Aunt    Breast cancer Maternal Grandmother    Breast cancer Cousin    Alcoholism Brother  Drug abuse Brother     Social History   Socioeconomic History   Marital status: Single    Spouse name: Not on file   Number of children: 3   Years of education: Not on file   Highest education level: Not on file  Occupational History   Not on file  Tobacco Use   Smoking status: Every Day    Packs/day: 0.25    Types: Cigarettes   Smokeless tobacco: Never   Tobacco comments:    3 cig/day  Vaping Use   Vaping Use: Former   Quit date: 02/01/2019  Substance and Sexual Activity   Alcohol use: Not Currently    Comment:  weekends wine/liquor   Drug use: Not Currently    Types: Marijuana    Comment: Last use - 11/17/19   Sexual activity: Yes    Birth  control/protection: Surgical    Comment: Tubal  Other Topics Concern   Not on file  Social History Narrative   Not on file   Social Determinants of Health   Financial Resource Strain: Not on file  Food Insecurity: Not on file  Transportation Needs: Not on file  Physical Activity: Not on file  Stress: Not on file  Social Connections: Not on file     Observations/Objective: Awake, alert and oriented x 3   Review of Systems  Constitutional:  Negative for fever, malaise/fatigue and weight loss.  HENT: Negative.  Negative for nosebleeds.   Eyes: Negative.  Negative for blurred vision, double vision and photophobia.  Respiratory: Negative.  Negative for cough and shortness of breath.   Cardiovascular: Negative.  Negative for chest pain, palpitations and leg swelling.  Gastrointestinal: Negative.  Negative for heartburn, nausea and vomiting.  Genitourinary:        See  HPI  Musculoskeletal: Negative.  Negative for myalgias.  Neurological: Negative.  Negative for dizziness, focal weakness, seizures and headaches.  Psychiatric/Behavioral: Negative.  Negative for suicidal ideas.    Assessment and Plan: Diagnoses and all orders for this visit:  Stress incontinence -     Ambulatory referral to Urology RECOMMEND WEIGHT LOSS AND DAILY KEGEL EXERCISES     Follow Up Instructions Return if symptoms worsen or fail to improve.     I discussed the assessment and treatment plan with the patient. The patient was provided an opportunity to ask questions and all were answered. The patient agreed with the plan and demonstrated an understanding of the instructions.   The patient was advised to call back or seek an in-person evaluation if the symptoms worsen or if the condition fails to improve as anticipated.  I provided 10 minutes of non-face-to-face time during this encounter including median intraservice time, reviewing previous notes, labs, imaging, medications and explaining diagnosis and  management.  Gildardo Pounds, FNP-BC

## 2021-04-06 ENCOUNTER — Ambulatory Visit: Payer: Medicaid Other | Admitting: Urology

## 2021-04-06 DIAGNOSIS — N393 Stress incontinence (female) (male): Secondary | ICD-10-CM

## 2021-04-24 ENCOUNTER — Other Ambulatory Visit (HOSPITAL_BASED_OUTPATIENT_CLINIC_OR_DEPARTMENT_OTHER): Payer: Self-pay

## 2021-04-24 DIAGNOSIS — R5383 Other fatigue: Secondary | ICD-10-CM

## 2021-04-24 DIAGNOSIS — R0683 Snoring: Secondary | ICD-10-CM

## 2021-04-28 ENCOUNTER — Encounter (HOSPITAL_COMMUNITY): Payer: Self-pay | Admitting: *Deleted

## 2021-04-28 ENCOUNTER — Other Ambulatory Visit: Payer: Self-pay

## 2021-04-28 ENCOUNTER — Emergency Department (HOSPITAL_COMMUNITY): Payer: Medicaid Other

## 2021-04-28 ENCOUNTER — Emergency Department: Payer: Medicaid Other | Attending: Neurology | Admitting: Neurology

## 2021-04-28 ENCOUNTER — Emergency Department (HOSPITAL_COMMUNITY)
Admission: EM | Admit: 2021-04-28 | Discharge: 2021-04-28 | Disposition: A | Discharge status: Home / Self Care | Payer: Medicaid Other | Attending: Emergency Medicine | Admitting: Emergency Medicine

## 2021-04-28 DIAGNOSIS — S92901A Unspecified fracture of right foot, initial encounter for closed fracture: Secondary | ICD-10-CM

## 2021-04-28 DIAGNOSIS — R0683 Snoring: Secondary | ICD-10-CM

## 2021-04-28 DIAGNOSIS — Y92009 Unspecified place in unspecified non-institutional (private) residence as the place of occurrence of the external cause: Secondary | ICD-10-CM | POA: Insufficient documentation

## 2021-04-28 DIAGNOSIS — S7012XA Contusion of left thigh, initial encounter: Secondary | ICD-10-CM | POA: Insufficient documentation

## 2021-04-28 DIAGNOSIS — W130XXA Fall from, out of or through balcony, initial encounter: Secondary | ICD-10-CM | POA: Diagnosis not present

## 2021-04-28 DIAGNOSIS — S99921A Unspecified injury of right foot, initial encounter: Secondary | ICD-10-CM | POA: Diagnosis present

## 2021-04-28 DIAGNOSIS — M7989 Other specified soft tissue disorders: Secondary | ICD-10-CM | POA: Insufficient documentation

## 2021-04-28 DIAGNOSIS — R5383 Other fatigue: Secondary | ICD-10-CM

## 2021-04-28 DIAGNOSIS — S92011A Displaced fracture of body of right calcaneus, initial encounter for closed fracture: Secondary | ICD-10-CM | POA: Diagnosis not present

## 2021-04-28 DIAGNOSIS — W19XXXA Unspecified fall, initial encounter: Secondary | ICD-10-CM

## 2021-04-28 IMAGING — DX DG ANKLE COMPLETE 3+V*R*
3 series · 3 of 3 positions shown · non-contrast
Comparison: None.

CLINICAL DATA: Fall.

EXAM:
RIGHT ANKLE - COMPLETE 3+ VIEW

[ankle ap]
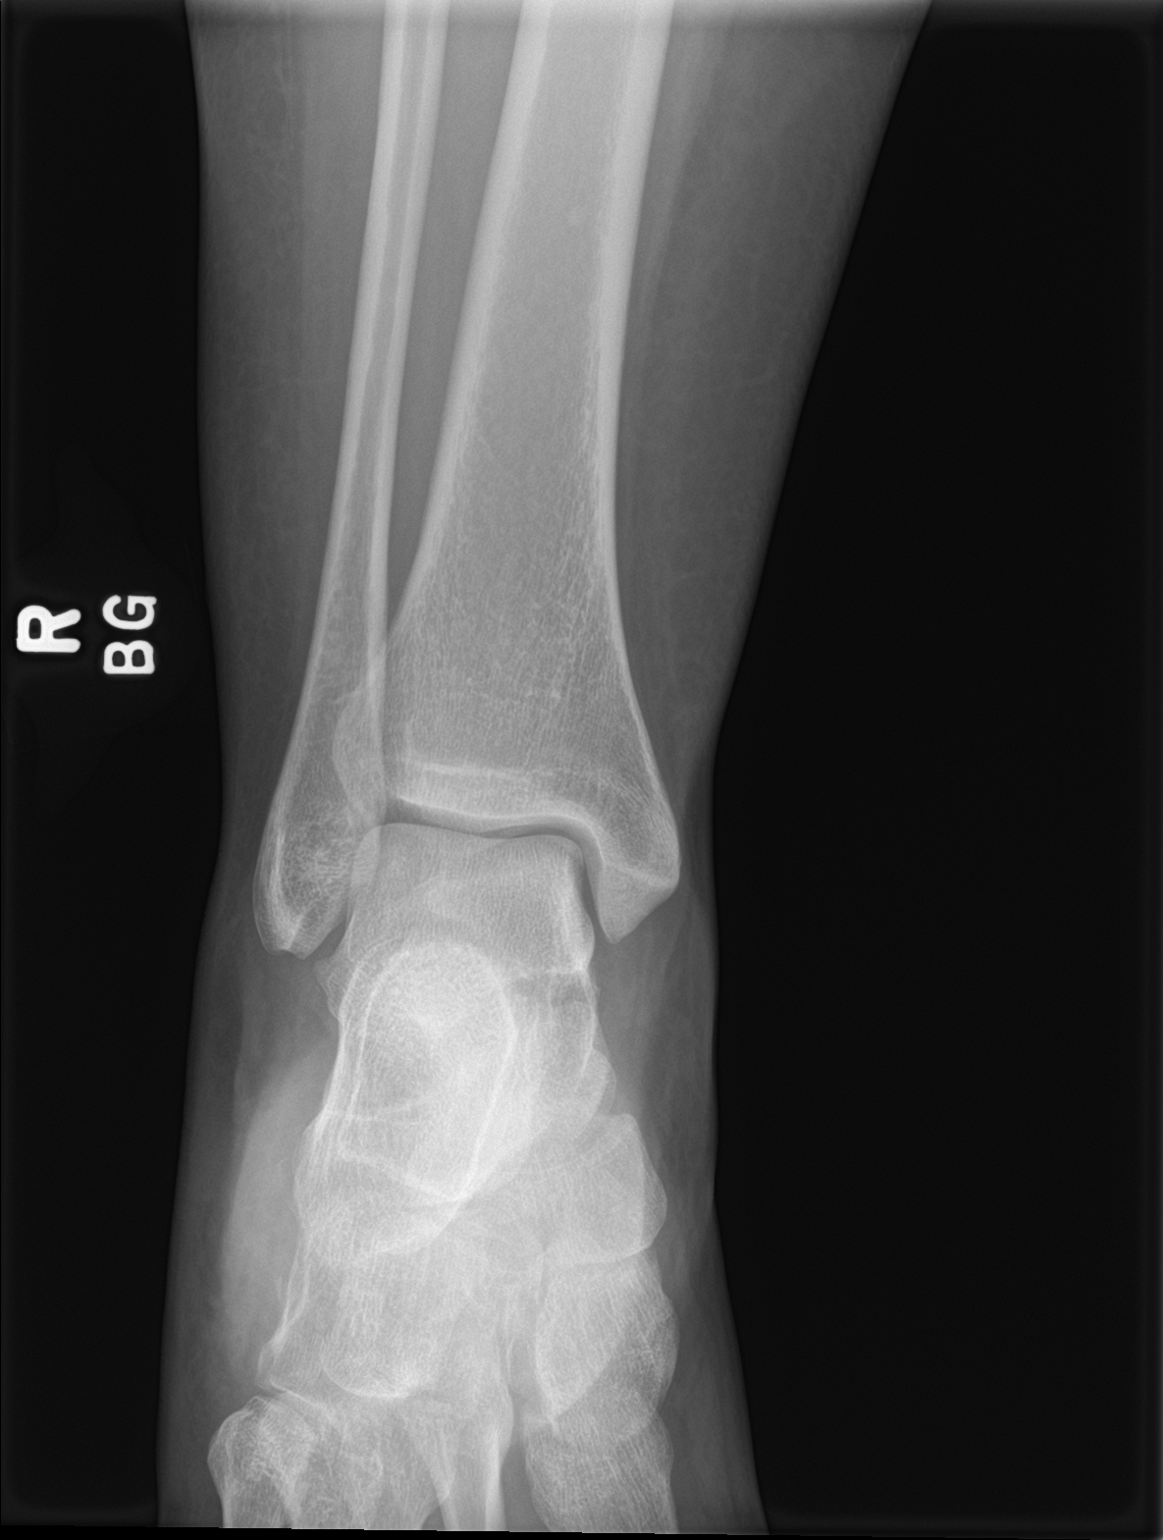

[ankle obl]
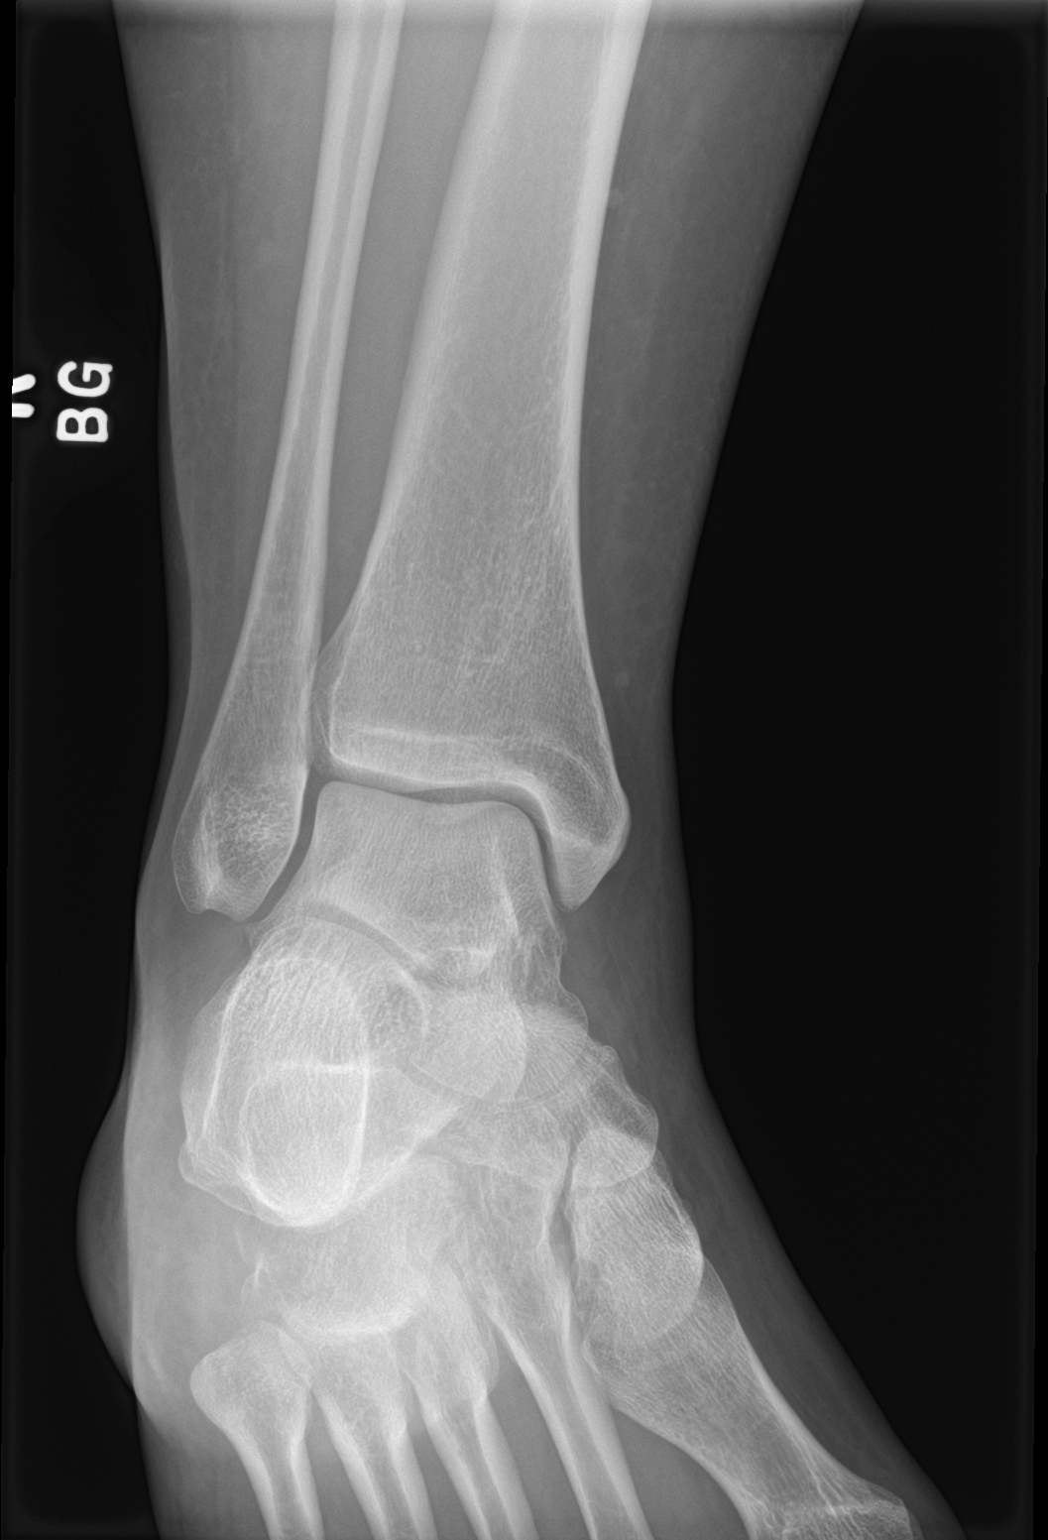

[ankle lat]
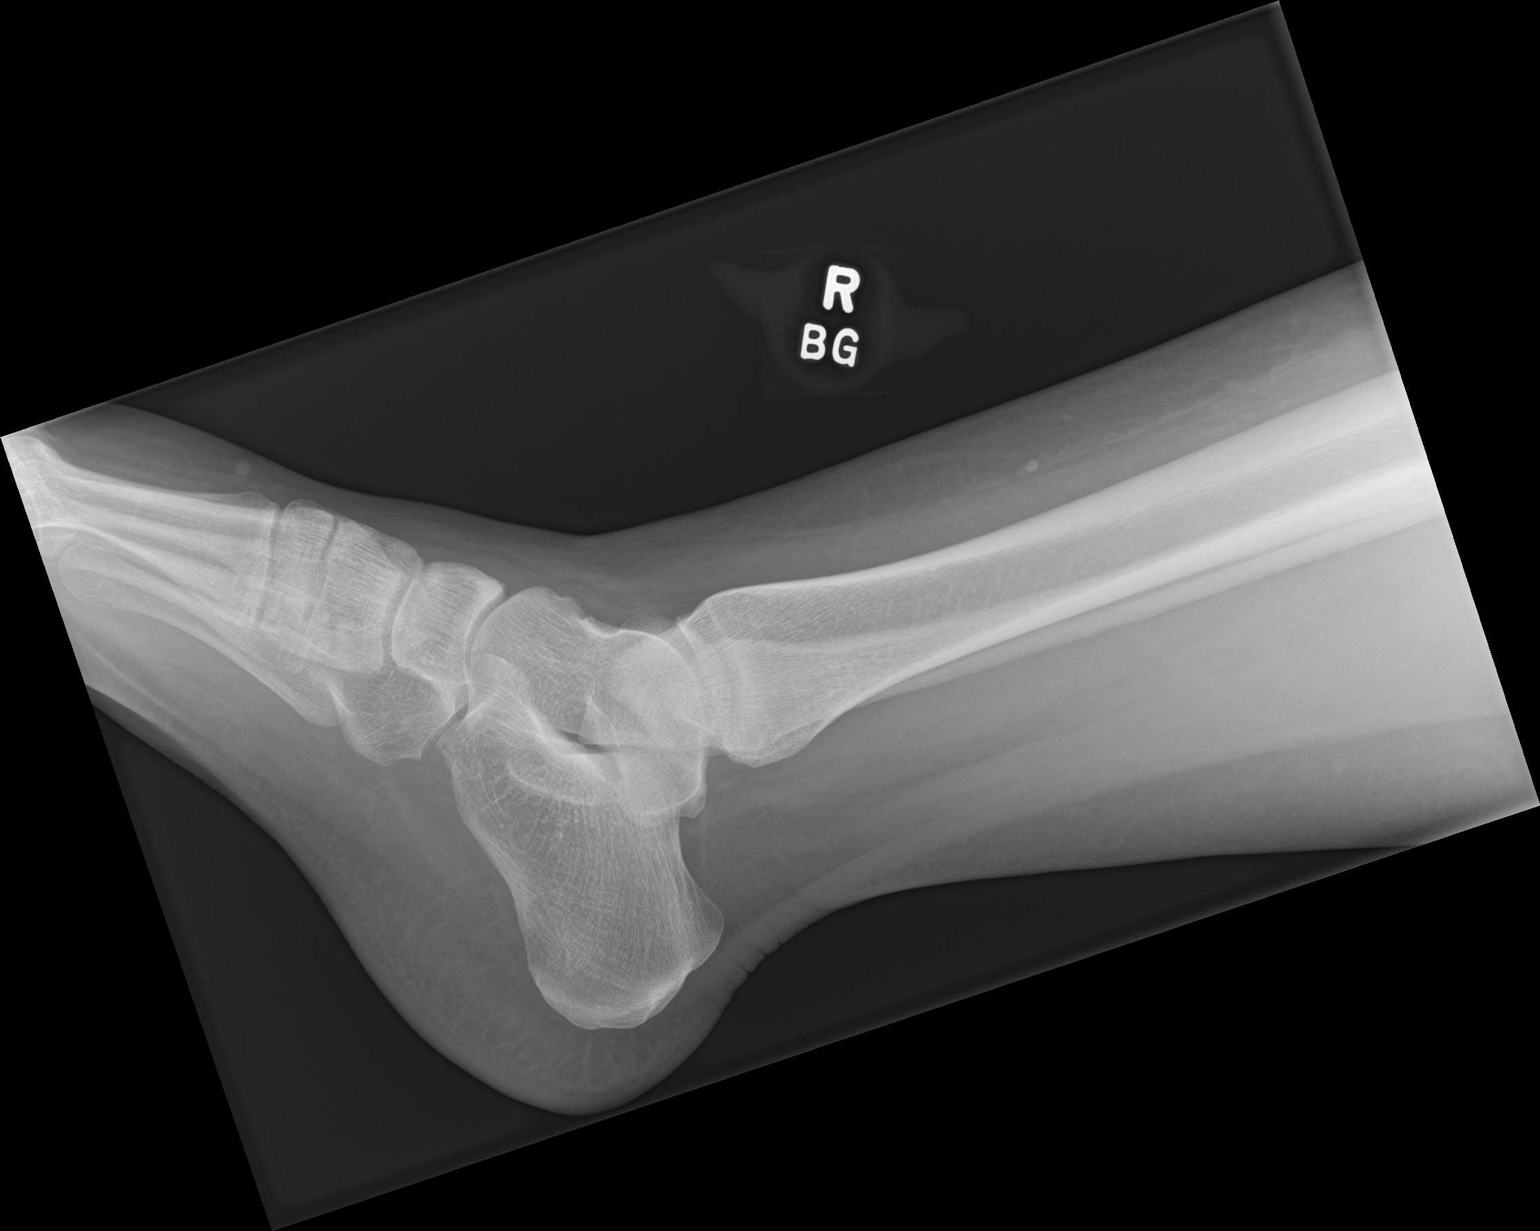

[3 of 3 positions shown; findings below may reference images not displayed]

FINDINGS: There is no evidence of fracture, dislocation, or joint effusion.
There is no evidence of arthropathy or other focal bone abnormality.
Soft tissues are unremarkable.
IMPRESSION: Negative.

## 2021-04-28 IMAGING — DX DG KNEE COMPLETE 4+V*R*
4 series · 4 of 4 positions shown · non-contrast
Comparison: None.

CLINICAL DATA: Fall.

EXAM:
RIGHT KNEE - COMPLETE 4+ VIEW

[knee ap]
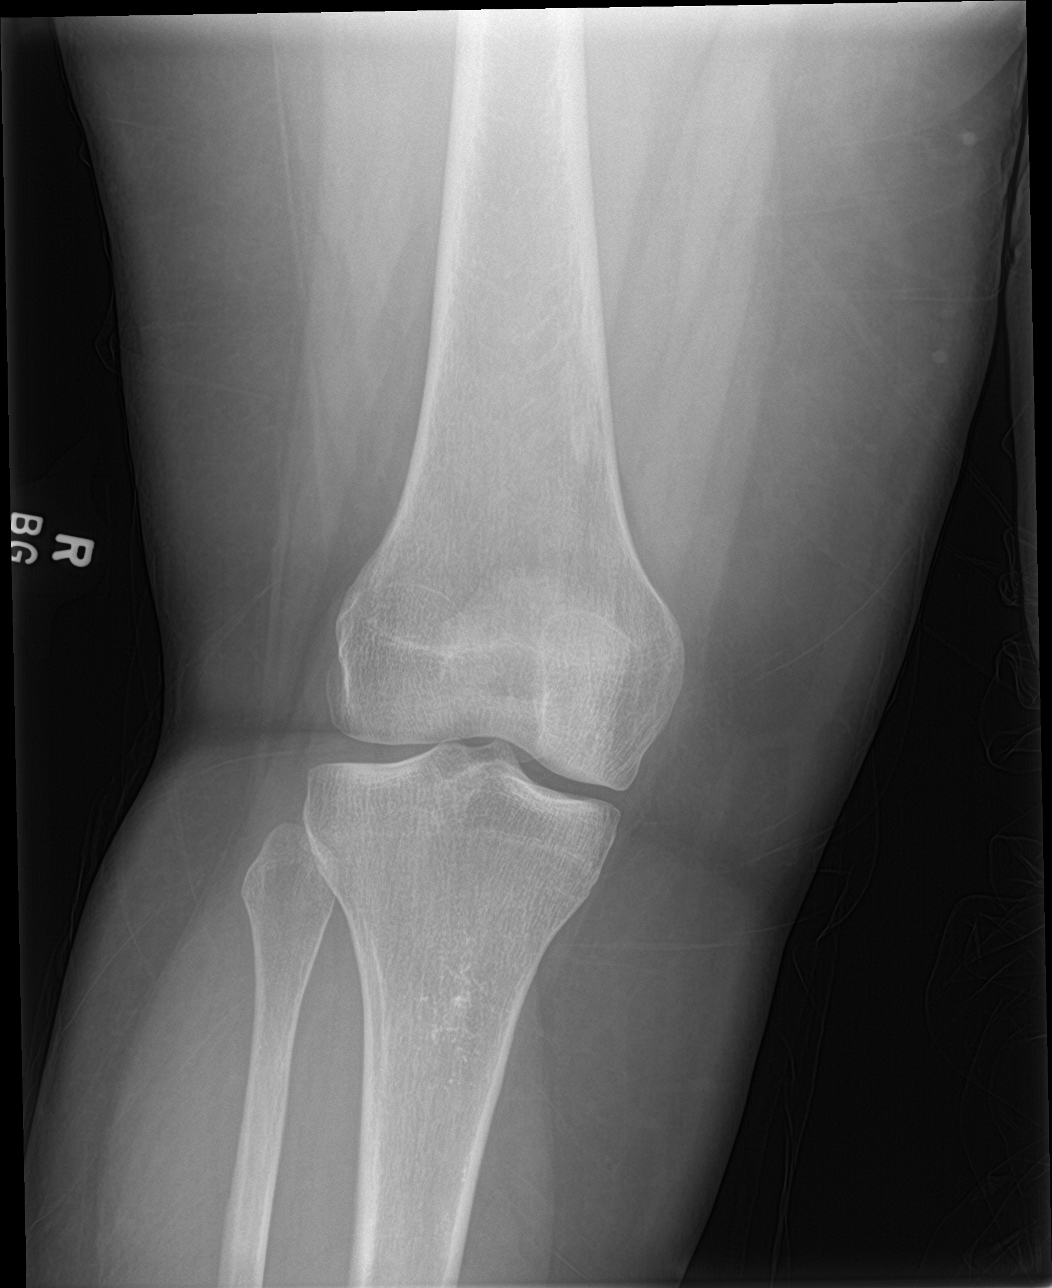

[knee obl (1 of 2)]
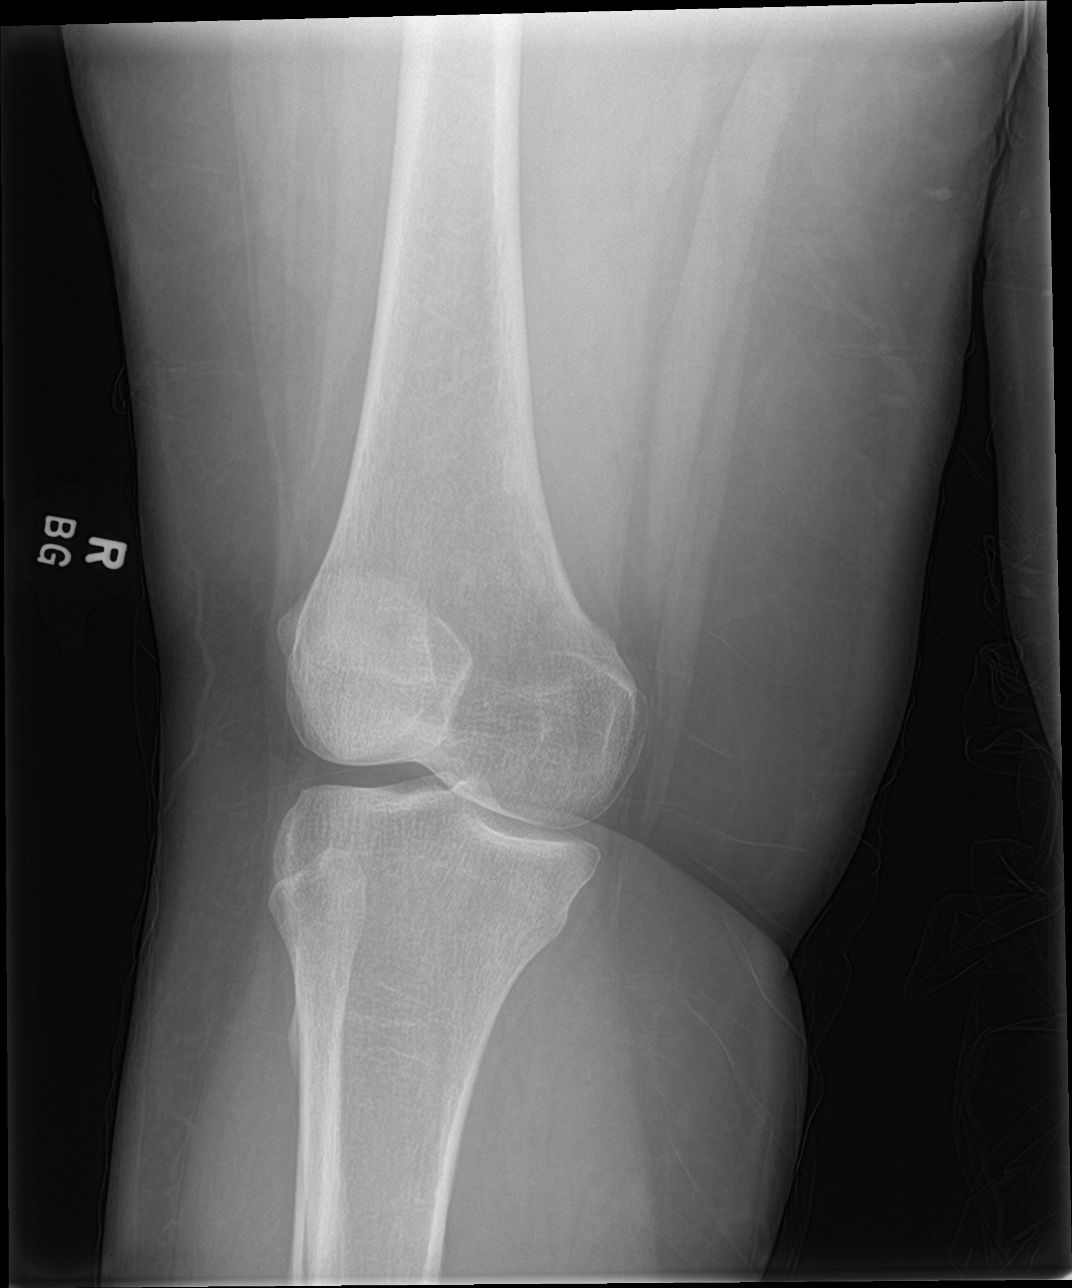

[knee obl (2 of 2)]
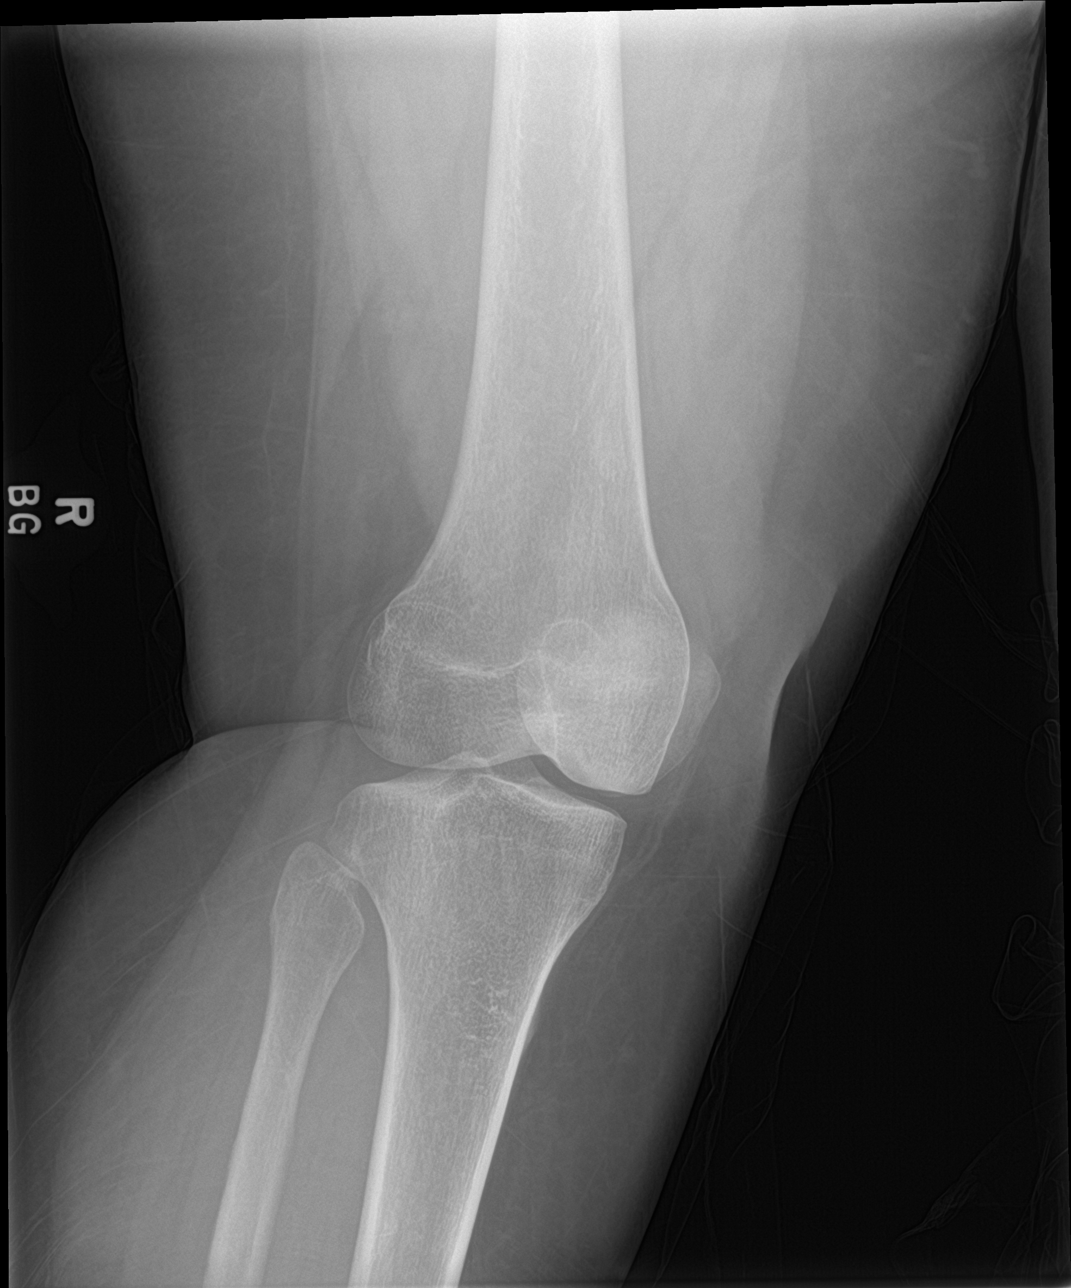

[knee lat]
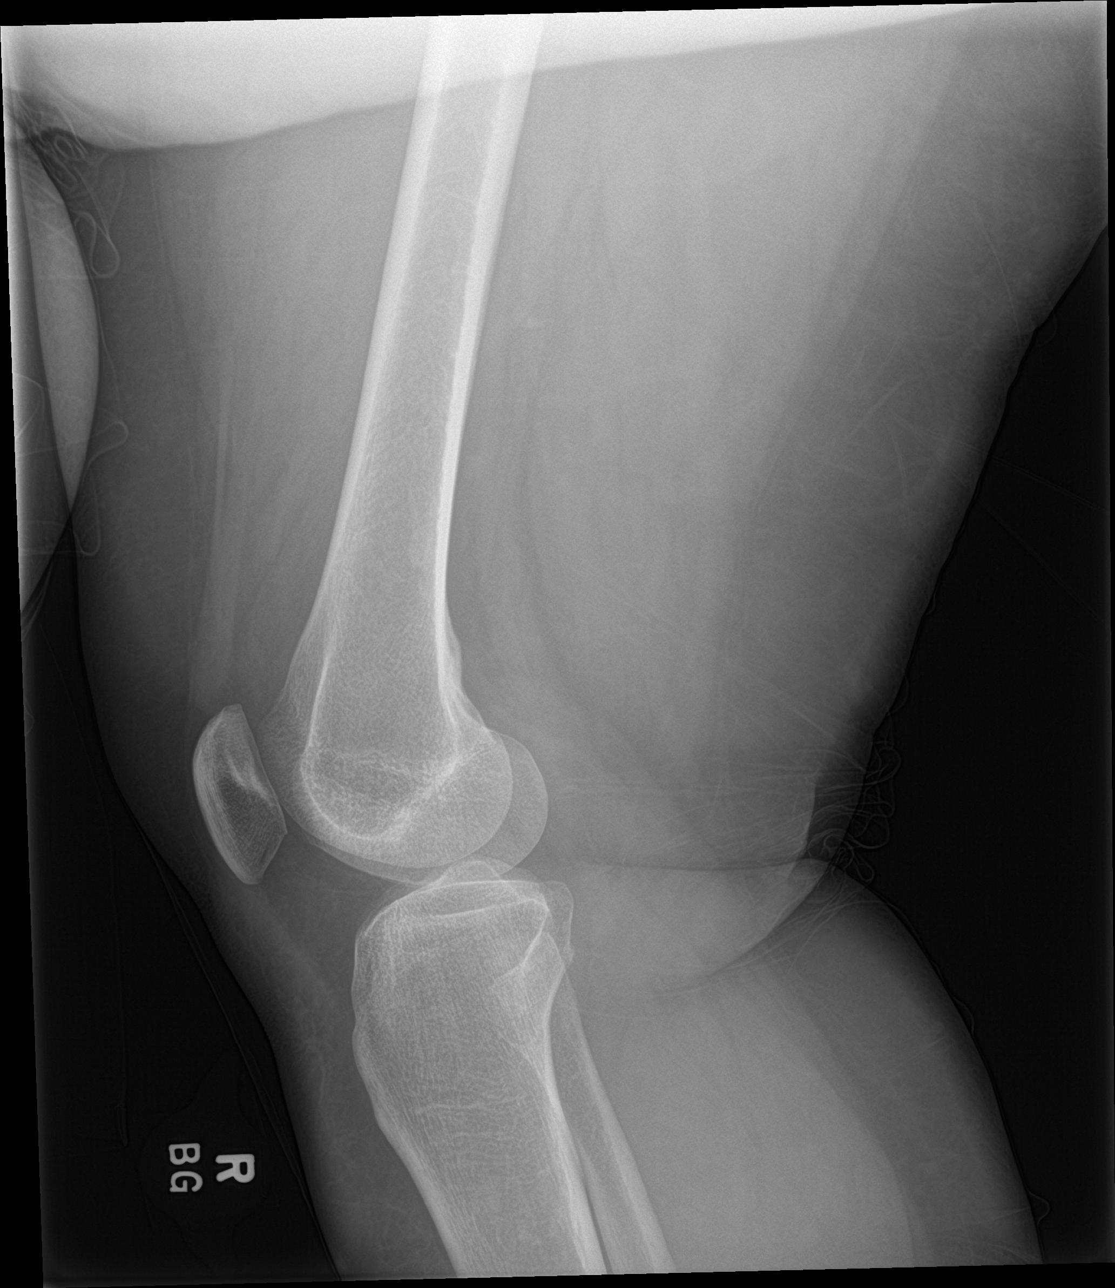

[4 of 4 positions shown; findings below may reference images not displayed]

FINDINGS: No evidence of fracture, dislocation, or joint effusion. No evidence
of arthropathy or other focal bone abnormality. Soft tissues are
unremarkable.
IMPRESSION: Negative.

## 2021-04-28 IMAGING — DX DG TIBIA/FIBULA 2V*R*
2 series · 2 of 2 positions shown · non-contrast
Comparison: None.

CLINICAL DATA: Fall through porch at home.  Right leg pain.

EXAM:
RIGHT TIBIA AND FIBULA - 2 VIEW

[tibia ap]
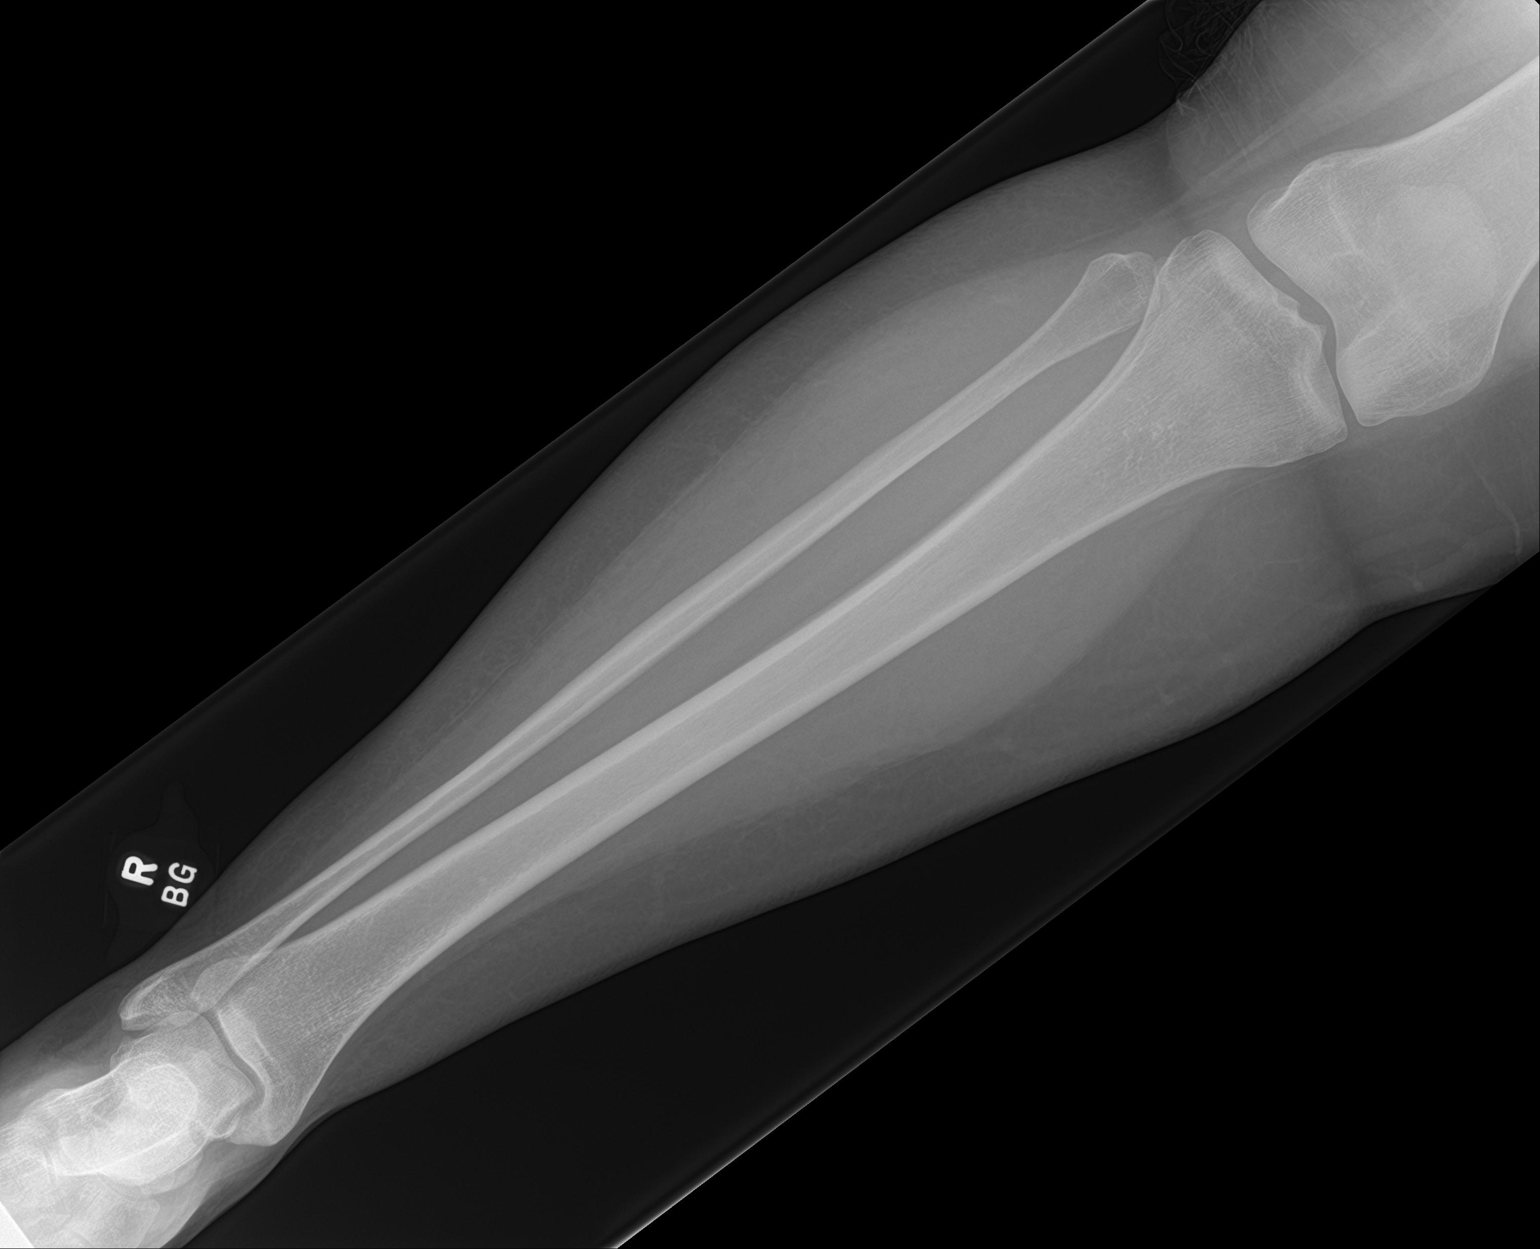

[tibia lat]
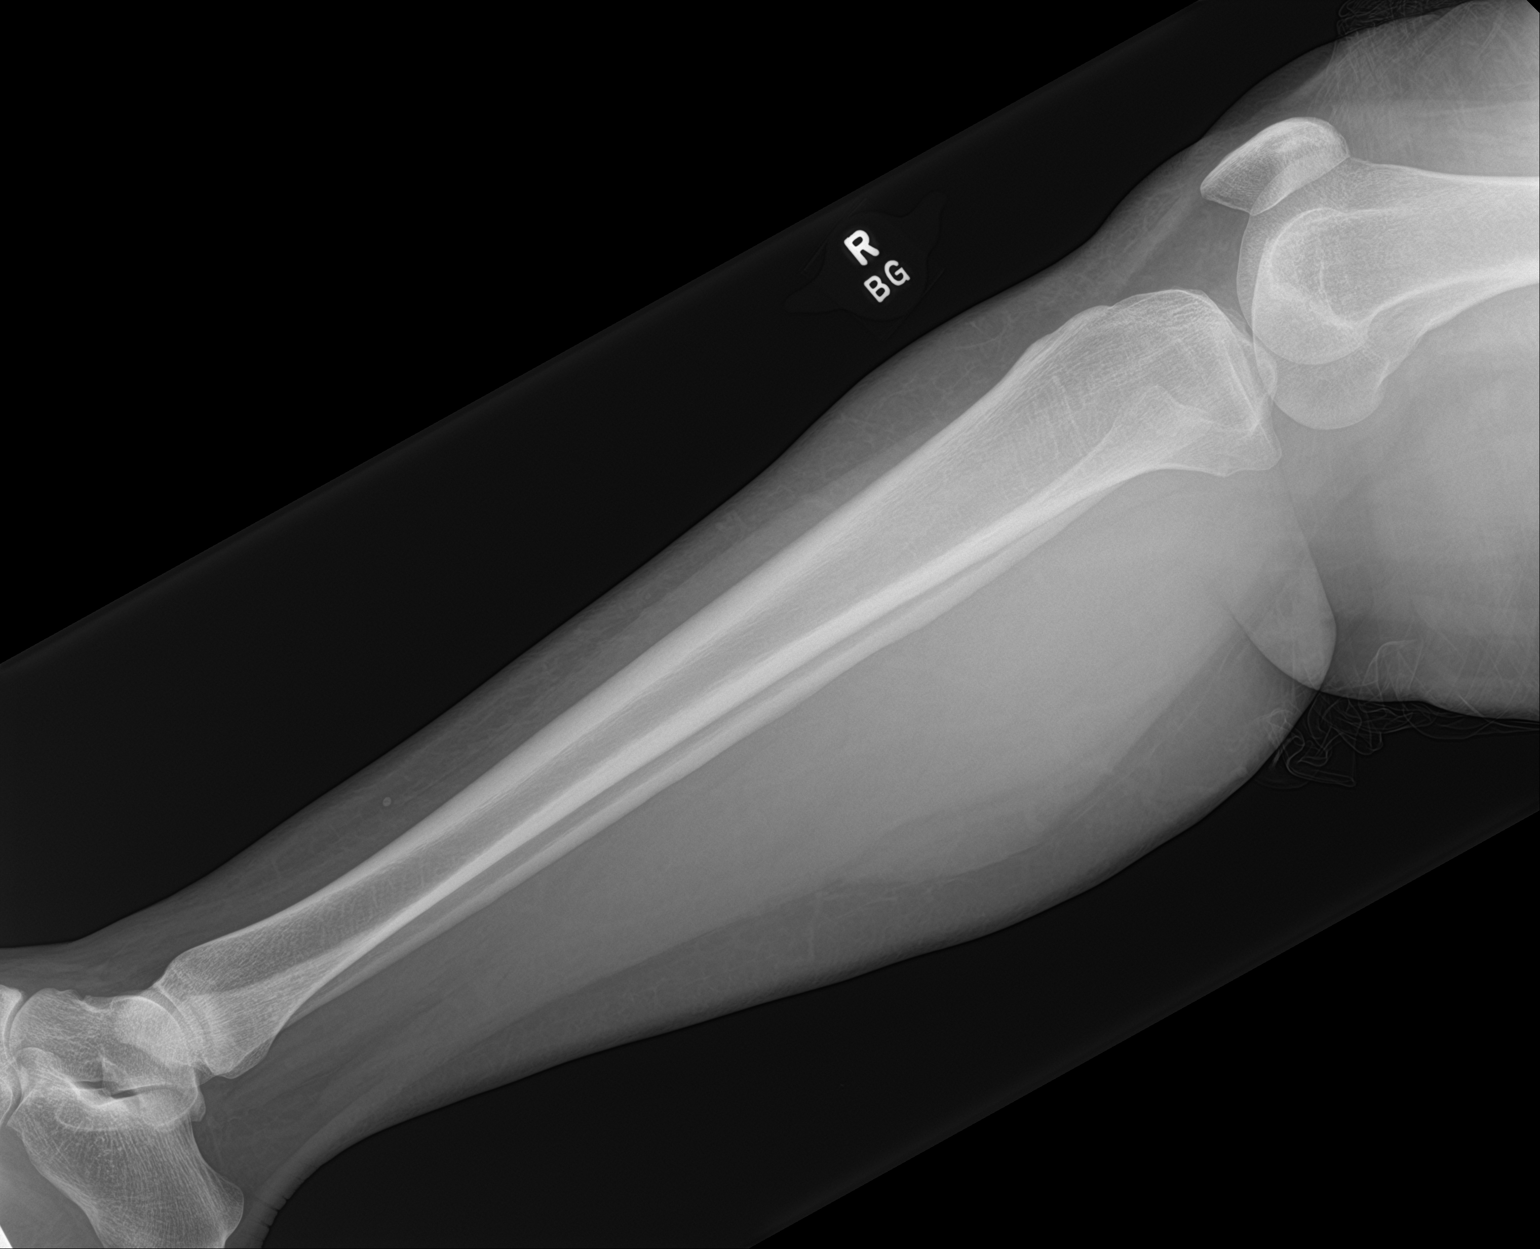

[2 of 2 positions shown; findings below may reference images not displayed]

FINDINGS: Cortical margins of the tibia and fibula are intact. There is no
evidence of fracture. Knee and ankle alignment are assessed on
dedicated joint exam. Minor soft tissue edema.
IMPRESSION: Soft tissue edema without acute fracture of the right lower leg.

## 2021-04-28 IMAGING — DX DG FOOT COMPLETE 3+V*R*
3 series · 3 of 3 positions shown · non-contrast
Comparison: None.

CLINICAL DATA: Fall through porch at home.  Right foot pain.

EXAM:
RIGHT FOOT COMPLETE - 3+ VIEW

[foot ap]
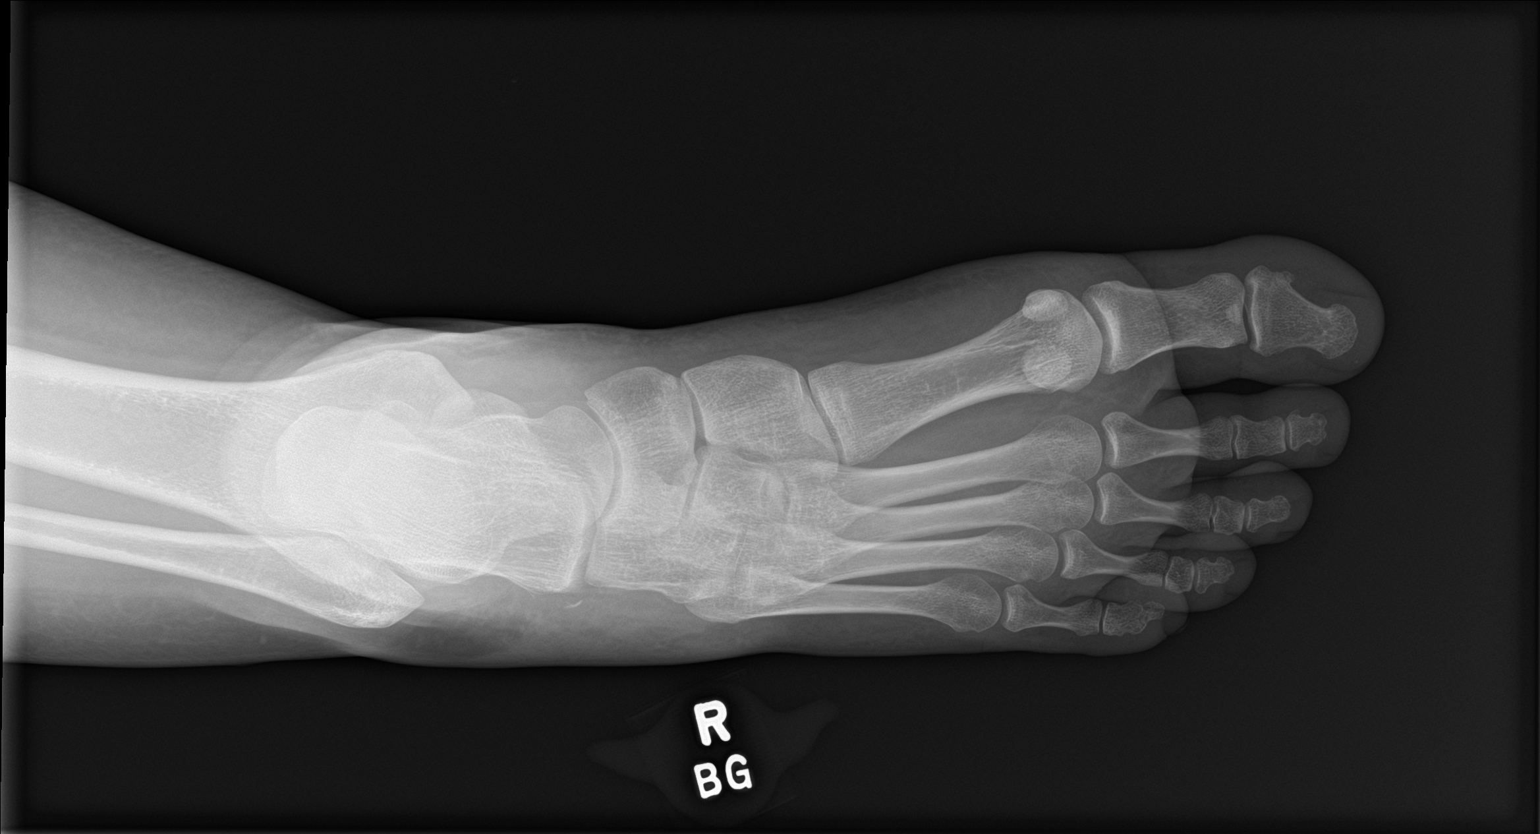

[foot obl]
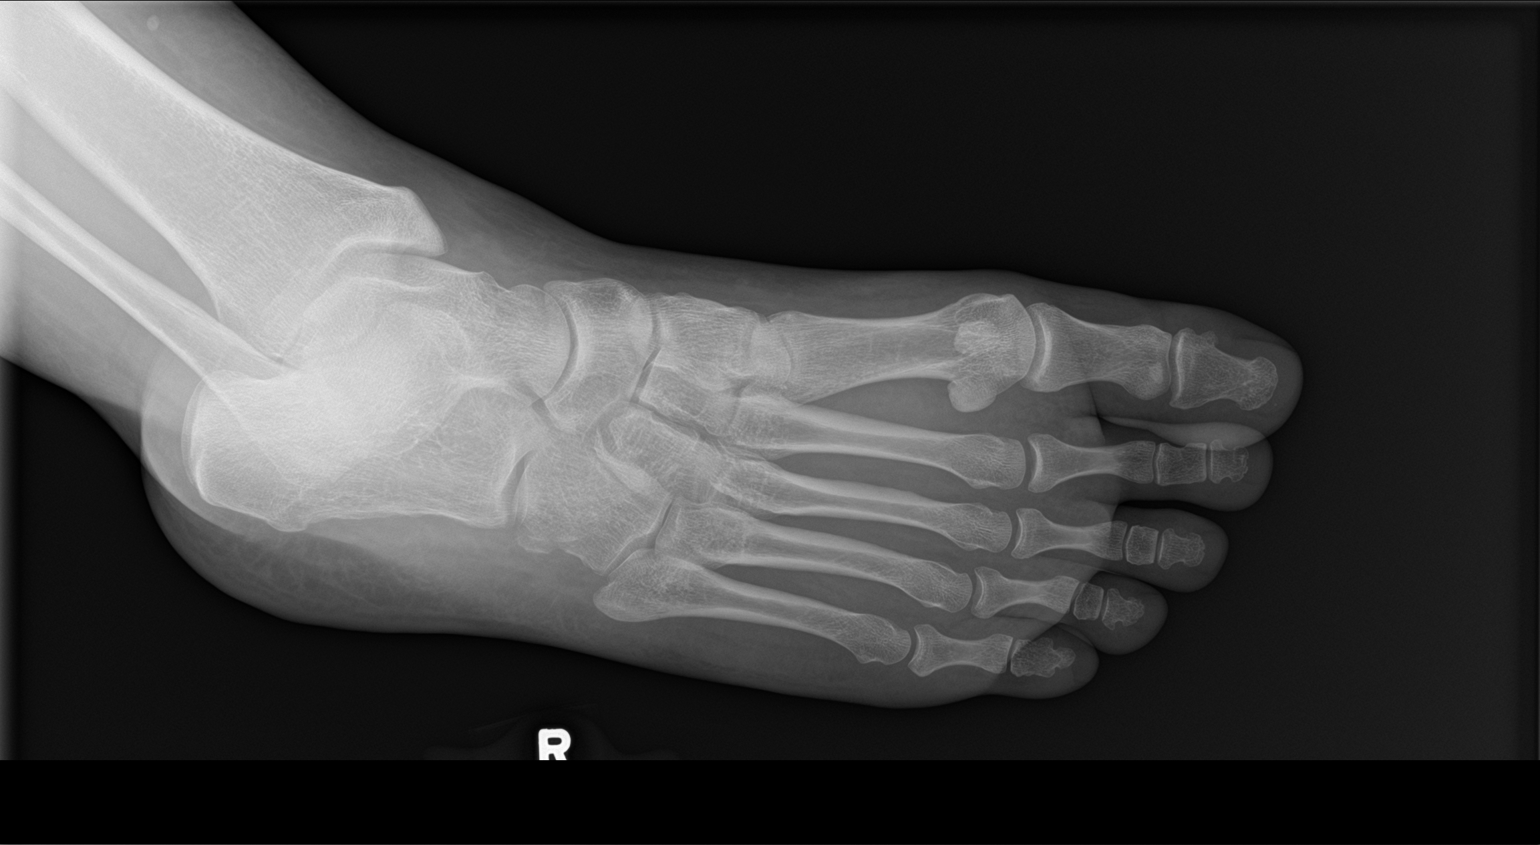

[foot lat]
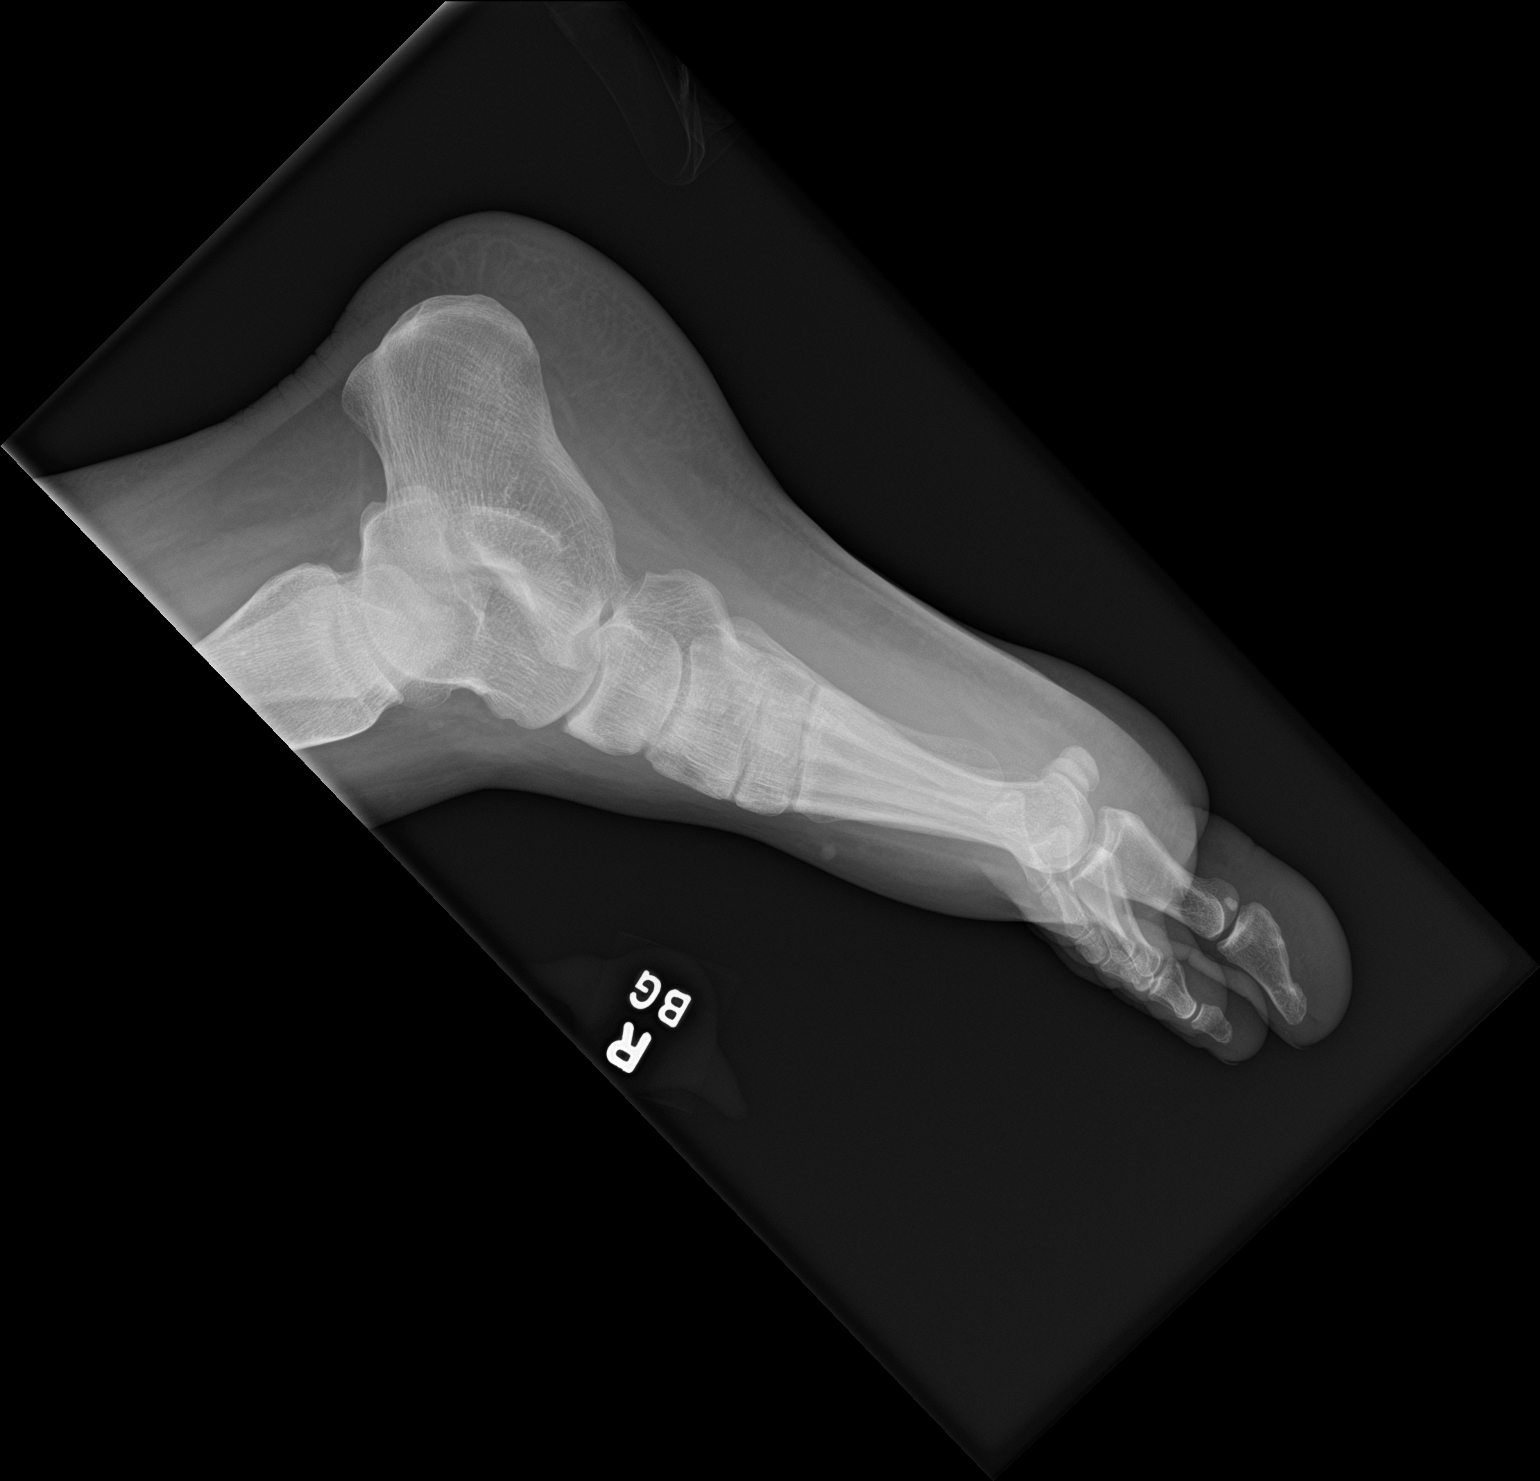

[3 of 3 positions shown; findings below may reference images not displayed]

FINDINGS: There is a punctate curvilinear density adjacent to the
anterolateral aspect of the calcaneus that may represent a small
avulsion fracture, overlying soft tissue edema is seen. There is
also an adjacent os peroneal, however this density appears separate.
No other fracture. Normal alignment. Soft tissue edema laterally.
IMPRESSION: Possible punctate avulsion fracture from the anterolateral aspect of
the calcaneus. Recommend correlation with point tenderness. Soft
tissue edema laterally.

## 2021-04-28 MED ORDER — IBUPROFEN 800 MG PO TABS: 800.0000 mg | ORAL_TABLET | Freq: Three times a day (TID) | ORAL | 0 refills | Status: DC | PRN

## 2021-04-28 MED ORDER — IBUPROFEN 800 MG PO TABS
800.0000 mg | ORAL_TABLET | Freq: Once | ORAL | Status: AC
Start: 2021-04-28 — End: 2021-04-28
  Administered 2021-04-28: 800 mg via ORAL
  Filled 2021-04-28: qty 1

## 2021-04-28 NOTE — ED Triage Notes (Signed)
Fell through porch at home, pain in right foot , leg and knee ?

## 2021-04-28 NOTE — Discharge Instructions (Addendum)
Your xray shows a possible avulsion fracture  ?

## 2021-04-28 NOTE — ED Provider Notes (Signed)
?[facility] ?Provider Note ? ? ?CSN: [id number] ?Arrival date & time: 04/28/21  1623 ? ?  ? ?History ? ?Chief Complaint  ?Patient presents with  ? Fall  ? ? ?Kellie Martinez is a 26 y.o. female. ? ?Pt reports she  fell through her porch.  Pt reports hit right foot and twisted.  Pt reports she heard a pop  ? ?The history is provided by the patient. No language interpreter was used.  ?Fall ?This is a new problem. The current episode started 1 to 2 hours ago. The problem occurs constantly. The problem has not changed since onset.Pertinent negatives include no chest pain and no abdominal pain. Nothing aggravates the symptoms. Nothing relieves the symptoms. She has tried nothing for the symptoms. The treatment provided no relief.  ? ?  ? ?Home Medications ?Prior to Admission medications   ?Medication Sig Start Date End Date Taking? Authorizing Provider  ?albuterol (VENTOLIN HFA) 108 (90 Base) MCG/ACT inhaler Inhale 2 puffs into the lungs every 6 (six) hours as needed for wheezing or shortness of breath. 10/22/20   Gildardo Pounds, NP  ?gabapentin (NEURONTIN) 300 MG capsule Take 1 capsule (300 mg total) by mouth 3 (three) times daily. 10/14/20   Gildardo Pounds, NP  ?ibuprofen (MOTRIN IB) 200 MG tablet Take 4 tablets (800 mg total) by mouth every 6 (six) hours as needed for mild pain or moderate pain. 12/16/20   Virl Cagey, MD  ?lamoTRIgine (LAMICTAL) 150 MG tablet Take 1 tablet (150 mg total) by mouth 2 (two) times daily. 11/11/20 02/09/21  Gildardo Pounds, NP  ?ondansetron (ZOFRAN) 4 MG tablet Take 1 tablet (4 mg total) by mouth every 8 (eight) hours as needed. ?Patient not taking: Reported on 12/16/2020 11/19/20 11/19/21  Virl Cagey, MD  ?oxyCODONE (ROXICODONE) 5 MG immediate release tablet Take 1 tablet (5 mg total) by mouth every 4 (four) hours as needed for breakthrough pain or severe pain. ?Patient not taking: Reported on 12/16/2020 11/24/20 11/24/21  Virl Cagey, MD  ?traZODone  (DESYREL) 100 MG tablet TAKE 2 TABLETS BY MOUTH AT BEDTIME 01/11/21   Gildardo Pounds, NP  ?   ? ?Allergies    ?5-alpha reductase inhibitors, Chloramphenicols, Levofloxacin, Penicillins, Phenergan [promethazine hcl], and Seroquel [quetiapine]   ? ?Review of Systems   ?Review of Systems  ?Cardiovascular:  Negative for chest pain.  ?Gastrointestinal:  Negative for abdominal pain.  ?All other systems reviewed and are negative. ? ?Physical Exam ?Updated Vital Signs ?BP 109/76   Pulse 87   Temp 97.8 ?F (36.6 ?C) (Oral)   Resp 20   Ht '5\' 1"'$  (1.549 m)   Wt 90.7 kg   LMP 04/05/2021   SpO2 100%   BMI 37.79 kg/m?  ?Physical Exam ?Vitals and nursing note reviewed.  ?Constitutional:   ?   Appearance: She is well-developed.  ?HENT:  ?   Head: Normocephalic.  ?Pulmonary:  ?   Effort: Pulmonary effort is normal.  ?Abdominal:  ?   General: There is no distension.  ?Musculoskeletal:     ?   General: Swelling and tenderness present.  ?   Cervical back: Normal range of motion.  ?   Comments: Bruised left thigh, tender to palpation,  Right ankle swollen and tender pain with moving,  nv and ns intact   ?Skin: ?   General: Skin is warm.  ?Neurological:  ?   Mental Status: She is alert and oriented to person, place, and  time.  ?Psychiatric:     ?   Mood and Affect: Mood normal.  ? ? ?ED Results / Procedures / Treatments   ?Labs ?(all labs ordered are listed, but only abnormal results are displayed) ?Labs Reviewed - No data to display ? ?EKG ?None ? ?Radiology ?DG Tibia/Fibula Right ? ?Result Date: 04/28/2021 ?CLINICAL DATA:  Fall through porch at home.  Right leg pain. EXAM: RIGHT TIBIA AND FIBULA - 2 VIEW COMPARISON:  None. FINDINGS: Cortical margins of the tibia and fibula are intact. There is no evidence of fracture. Knee and ankle alignment are assessed on dedicated joint exam. Minor soft tissue edema. IMPRESSION: Soft tissue edema without acute fracture of the right lower leg. Electronically Signed   By: Keith Rake M.D.    On: 04/28/2021 17:37  ? ?DG Ankle Complete Left ? ?Result Date: 04/28/2021 ?CLINICAL DATA:  Fall through porch at home. EXAM: LEFT ANKLE COMPLETE - 3+ VIEW COMPARISON:  None. FINDINGS: There is no evidence of fracture, dislocation, or joint effusion. Ankle mortise is preserved. There is no evidence of arthropathy or other focal bone abnormality. Soft tissues are unremarkable. IMPRESSION: Negative radiographs of the left ankle. Electronically Signed   By: Keith Rake M.D.   On: 04/28/2021 17:39  ? ?DG Ankle Complete Right ? ?Result Date: 04/28/2021 ?CLINICAL DATA:  Fall. EXAM: RIGHT ANKLE - COMPLETE 3+ VIEW COMPARISON:  None. FINDINGS: There is no evidence of fracture, dislocation, or joint effusion. There is no evidence of arthropathy or other focal bone abnormality. Soft tissues are unremarkable. IMPRESSION: Negative. Electronically Signed   By: Ronney Asters M.D.   On: 04/28/2021 17:36  ? ?DG Knee Complete 4 Views Right ? ?Result Date: 04/28/2021 ?CLINICAL DATA:  Fall. EXAM: RIGHT KNEE - COMPLETE 4+ VIEW COMPARISON:  None. FINDINGS: No evidence of fracture, dislocation, or joint effusion. No evidence of arthropathy or other focal bone abnormality. Soft tissues are unremarkable. IMPRESSION: Negative. Electronically Signed   By: Ronney Asters M.D.   On: 04/28/2021 17:36  ? ?DG Foot Complete Right ? ?Result Date: 04/28/2021 ?CLINICAL DATA:  Fall through porch at home.  Right foot pain. EXAM: RIGHT FOOT COMPLETE - 3+ VIEW COMPARISON:  None. FINDINGS: There is a punctate curvilinear density adjacent to the anterolateral aspect of the calcaneus that may represent a small avulsion fracture, overlying soft tissue edema is seen. There is also an adjacent os peroneal, however this density appears separate. No other fracture. Normal alignment. Soft tissue edema laterally. IMPRESSION: Possible punctate avulsion fracture from the anterolateral aspect of the calcaneus. Recommend correlation with point tenderness. Soft tissue  edema laterally. Electronically Signed   By: Keith Rake M.D.   On: 04/28/2021 17:41   ? ?Procedures ?Procedures  ? ? ?Medications Ordered in ED ?Medications - No data to display ? ?ED Course/ Medical Decision Making/ A&P ?  ?                        ?Medical Decision Making ?Amount and/or Complexity of Data Reviewed ?Radiology: ordered and independent interpretation performed. Decision-making details documented in ED Course. ?   Details: Xrays ordered reviewed and interpreted  possible  punctate avulsion fracture ? ?Risk ?OTC drugs. ? ? ? ? ? ? ? ? ? ? ?Final Clinical Impression(s) / ED Diagnoses ?Final diagnoses:  ?Fall, initial encounter  ?Closed fracture of right foot, initial encounter  ? ? ?Rx / DC Orders ?ED Discharge Orders   ? ? None  ? ?  ?  An After Visit Summary was printed and given to the patient.  ? ?  ?Fransico Meadow, [place] ?04/28/21 1930 ? ?  ?Varney Biles, MD ?04/30/21 2040 ? ?

## 2021-04-29 ENCOUNTER — Telehealth: Payer: Self-pay | Admitting: Orthopedic Surgery

## 2021-04-29 ENCOUNTER — Telehealth: Payer: Self-pay

## 2021-04-29 NOTE — Telephone Encounter (Signed)
Transition Care Management Unsuccessful Follow-up Telephone Call ? ?Date of discharge and from where:  04/28/2021-[facility]  ? ?Attempts:  1st Attempt ? ?Reason for unsuccessful TCM follow-up call:  Unable to leave message ? ?  ?

## 2021-04-29 NOTE — Telephone Encounter (Signed)
Reached patient; appointment scheduled for date she elects, 05/01/21, with Dr Amedeo Kinsman. ?

## 2021-04-29 NOTE — Telephone Encounter (Signed)
Call from patient following emergency room at [facility] yesterday, 04/27/21. Patient was referred to a [place] provider - I offered first available that schedule shows, Friday, 05/01/21, although patient aware we are checking other option. Please review and advise based on schedule template. ?

## 2021-04-30 DIAGNOSIS — F410 Panic disorder [episodic paroxysmal anxiety] without agoraphobia: Secondary | ICD-10-CM | POA: Diagnosis not present

## 2021-04-30 DIAGNOSIS — F3132 Bipolar disorder, current episode depressed, moderate: Secondary | ICD-10-CM | POA: Diagnosis not present

## 2021-04-30 DIAGNOSIS — F900 Attention-deficit hyperactivity disorder, predominantly inattentive type: Secondary | ICD-10-CM | POA: Diagnosis not present

## 2021-04-30 DIAGNOSIS — F411 Generalized anxiety disorder: Secondary | ICD-10-CM | POA: Diagnosis not present

## 2021-04-30 NOTE — Telephone Encounter (Signed)
Transition Care Management Unsuccessful Follow-up Telephone Call ? ?Date of discharge and from where:  04/28/2021-[facility]  ? ?Attempts:  2nd Attempt ? ?Reason for unsuccessful TCM follow-up call:  Unable to leave message ? ?  ?

## 2021-05-01 ENCOUNTER — Ambulatory Visit: Payer: Medicaid Other | Admitting: Orthopedic Surgery

## 2021-05-01 ENCOUNTER — Encounter: Payer: Self-pay | Admitting: Orthopedic Surgery

## 2021-05-01 ENCOUNTER — Other Ambulatory Visit: Payer: Self-pay

## 2021-05-01 VITALS — BP 133/80 | HR 102 | Ht 61.0 in | Wt 200.0 lb

## 2021-05-01 DIAGNOSIS — S92031A Displaced avulsion fracture of tuberosity of right calcaneus, initial encounter for closed fracture: Secondary | ICD-10-CM | POA: Diagnosis not present

## 2021-05-01 NOTE — Progress Notes (Signed)
New Patient Visit ? ?Assessment: ?VANDANA HAMAN is a 26 y.o. female with the following: ?1. Closed displaced avulsion fracture of tuberosity of right calcaneus, initial encounter ? ?Plan: ?Reviewed radiographs in clinic today which demonstrates minimally displaced avulsion fractures of the anterior lateral calcaneus.  She has diffuse swelling and bruising of the foot.  She should remain in the boot, but she can be weightbearing as tolerated.  As her pain improves, I have provided her with home exercises to initiate.  Recommend ice, elevation and medications as needed.  Follow-up in 2 weeks for repeat evaluation. ? ? ?Follow-up: ?Return in about 2 weeks (around 05/15/2021). ? ?Subjective: ? ?Chief Complaint  ?Patient presents with  ? New Patient (Initial Visit)  ? Foot Injury  ?  Closed fracture of right foot ?DOI 04/28/21 ?Pt fell through her porch  ? ? ?History of Present Illness: ?TEFFANY BLASZCZYK is a 26 y.o. female who presents for evaluation of right ankle pain.  Just 2 days ago, she was on the back porch, when her foot fell through.  She noted immediate pain.  She presented to a local urgent care, and x-rays demonstrated a small avulsion fracture off the calcaneus.  She is been placed in a boot.  She has been unable to bear weight.  She continues to have pain.  She is noted lots of swelling and bruising in her foot.  No additional injuries. ? ? ?Review of Systems: ?No fevers or chills ?No numbness or tingling ?No chest pain ?No shortness of breath ?No bowel or bladder dysfunction ?No GI distress ?No headaches ? ? ?Medical History: ? ?Past Medical History:  ?Diagnosis Date  ? Anemia   ? Anxiety   ? 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 Asthma   ? Bipolar 1 disorder ([place])   ? 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  ? Chronic kidney disease   ? Depression   ? GERD (gastroesophageal reflux disease)   ? Headache   ? otc med prn  ? History of kidney stones   ? HLD (hyperlipidemia)   ? diet controlled  ? Molar pregnancy   ? Pneumonia   ? x 1 yrs  ago per patient 11/22/19  ? Renal calculus or stone   ? had stent/ lithotripsy in past  ? Sleep apnea   ? does not use cpap  ? Substance abuse ([place])   ? Suicide attempt [facility])   ? x 2  ? ? ?Past Surgical History:  ?Procedure Laterality Date  ? CERVICAL DISC ARTHROPLASTY N/A 11/26/2019  ? Procedure: CERVICAL DISC ARTHROPLASTY C5-6 WITH PRO [place];  Surgeon: Jessy Oto, MD;  Location: [facility];  Service: Orthopedics;  Laterality: N/A;  ? CYSTOSCOPY    ? x2 with stone extraction  ? EXTRACORPOREAL SHOCK WAVE LITHOTRIPSY Right 06/20/2013  ? Procedure: EXTRACORPOREAL SHOCK WAVE LITHOTRIPSY (ESWL);  Surgeon: Edwin Dada, MD;  Location: [facility];  Service: Urology;  Laterality: Right;  ? FOOT SURGERY Left 2003  ? LIPOMA EXCISION Right 11/19/2020  ? Procedure: EXCISION LIPOMA;  Surgeon: Virl Cagey, MD;  Location: [facility];  Service: General;  Laterality: Right;  ? LITHOTRIPSY    ? TUBAL LIGATION  2017  ? TUBAL LIGATION    ? UMBILICAL HERNIA REPAIR N/A 03/28/2020  ? Procedure: HERNIA REPAIR UMBILICAL ADULT;  Surgeon: Virl Cagey, MD;  Location: [facility];  Service: General;  Laterality: N/A;  ? ? ?Family History  ?Problem Relation Age of Onset  ? Depression Mother   ?  Asthma Mother   ? Hypertension Mother   ? Mood Disorder Mother   ? Heart disease Father   ? Kidney disease Father   ? Alcoholism Father   ? Breast cancer Paternal Aunt   ? Breast cancer Maternal Grandmother   ? Breast cancer Cousin   ? Alcoholism Brother   ? Drug abuse Brother   ? ?Social History  ? ?Tobacco Use  ? Smoking status: Every Day  ?  Packs/day: 0.25  ?  Types: Cigarettes  ? Smokeless tobacco: Never  ? Tobacco comments:  ?  3 cig/day  ?Vaping Use  ? Vaping Use: Former  ? Quit date: 02/01/2019  ?Substance Use Topics  ? Alcohol use: Not Currently  ?  Comment:  weekends wine/liquor  ? Drug use: Not Currently  ?  Types: Marijuana  ?  Comment: Last use - 11/17/19  ? ? ?Allergies  ?Allergen Reactions  ? 5-Alpha Reductase Inhibitors   ?  Per pt  "outbursts"  ? Chloramphenicols   ? Levofloxacin   ?  Unknown reaction  ? Penicillins Hives  ?  No anaphylaxis ? ?Did it involve swelling of the face/tongue/throat, SOB, or low BP? No ?Did it involve sudden or severe rash/hives, skin peeling, or any reaction on the inside of your mouth or nose? Yes ?Did you need to seek medical attention at a hospital or doctor's office? Yes ?When did it last happen?      Childhood ?If all above answers are ?NO?, may proceed with cephalosporin use. ?  ? Phenergan [Promethazine Hcl]   ?  Tremors, restless leg, akathesia 01-26-2018  ? Seroquel [Quetiapine]   ?  Leg tremors   ? ? ?Current Meds  ?Medication Sig  ? albuterol (VENTOLIN HFA) 108 (90 Base) MCG/ACT inhaler Inhale 2 puffs into the lungs every 6 (six) hours as needed for wheezing or shortness of breath.  ? gabapentin (NEURONTIN) 300 MG capsule Take 1 capsule (300 mg total) by mouth 3 (three) times daily.  ? ibuprofen (ADVIL) 800 MG tablet Take 1 tablet (800 mg total) by mouth every 8 (eight) hours as needed.  ? lamoTRIgine (LAMICTAL) 150 MG tablet Take 1 tablet (150 mg total) by mouth 2 (two) times daily.  ? ondansetron (ZOFRAN) 4 MG tablet Take 1 tablet (4 mg total) by mouth every 8 (eight) hours as needed.  ? oxyCODONE (ROXICODONE) 5 MG immediate release tablet Take 1 tablet (5 mg total) by mouth every 4 (four) hours as needed for breakthrough pain or severe pain.  ? traZODone (DESYREL) 100 MG tablet TAKE 2 TABLETS BY MOUTH AT BEDTIME  ? ? ?Objective: ?BP 133/80   Pulse (!) 102   Ht '5\' 1"'$  (1.549 m)   Wt 200 lb (90.7 kg)   LMP 04/05/2021   BMI 37.79 kg/m?  ? ?Physical Exam: ? ?General: Alert and oriented. and No acute distress. ?Gait: Right sided antalgic gait. ? ?Right foot and ankle with diffuse swelling.  She has ecchymosis over the dependent aspects of the foot and ankle.  Tenderness to palpation over the anterior and lateral ankle.  She tolerates dorsiflexion to a plantigrade position.  Toes warm and well-perfused.   2+ DP pulse.  Active motion intact in the [facility]/TA. ? ?IMAGING: ?I personally reviewed images previously obtained from the ED ? ?Small, avulsion fractures of the anterior lateral aspect of the calcaneus.  No additional injuries are noted.  No dislocations. ? ?New Medications:  ?No orders of the defined types were placed in  this encounter. ? ? ? ? ?Mordecai Rasmussen, MD ? ?05/01/2021 ?11:12 PM ? ? ?

## 2021-05-01 NOTE — Patient Instructions (Signed)
Instructions ° °1.  You have sustained an ankle sprain, or similar exercises that can be treated as an ankle sprain.  **These exercises can also be used as part of recovery from an ankle fracture.  °2.  I encourage you to stay on your feet and gradually remove your walking boot.   °3.  Below are some exercises that you can complete on your own to improve your symptoms.  °4.  As an alternative, you can search for ankle sprain exercises online, and can see some demonstrations on YouTube  °5.  If you are having difficulty with these exercises, we can also prescribe formal physical therapy ° °Ankle Exercises °Ask your health care provider which exercises are safe for you. Do exercises exactly as told by your health care provider and adjust them as directed. It is normal to feel mild stretching, pulling, tightness, or mild discomfort as you do these exercises. Stop right away if you feel sudden pain or your pain gets worse. Do not begin these exercises until told by your health care provider. ° °Stretching and range-of-motion exercises °These exercises warm up your muscles and joints and improve the movement and flexibility of your ankle. These exercises may also help to relieve pain. ° °Dorsiflexion/plantar flexion ° °Sit with your R knee straight or bent. Do not rest your foot on anything. °Flex your left ankle to tilt the top of your foot toward your shin. This is called dorsiflexion. °Hold this position for 5 seconds. °Point your toes downward to tilt the top of your foot away from your shin. This is called plantar flexion. °Hold this position for 5 seconds. °Repeat 10 times. Complete this exercise 2-3 times a day.  As tolerated ° °Ankle alphabet ° °Sit with your R foot supported at your lower leg. °Do not rest your foot on anything. °Make sure your foot has room to move freely. °Think of your R foot as a paintbrush: °Move your foot to trace each letter of the alphabet in the air. Keep your hip and knee still while  you trace the letters. Trace every letter from A to Z. °Make the letters as large as you can without causing or increasing any discomfort. ° °Repeat 2-3 times. Complete this exercise 2-3 times a day. ° ° °Strengthening exercises °These exercises build strength and endurance in your ankle. Endurance is the ability to use your muscles for a long time, even after they get tired. °Dorsiflexors °These are muscles that lift your foot up. °Secure a rubber exercise band or tube to an object, such as a table leg, that will stay still when the band is pulled. Secure the other end around your R foot. °Sit on the floor, facing the object with your R leg extended. The band or tube should be slightly tense when your foot is relaxed. °Slowly flex your R ankle and toes to bring your foot toward your shin. °Hold this position for 5 seconds. °Slowly return your foot to the starting position, controlling the band as you do that. °Repeat 10 times. Complete this exercise 2-3 times a day. ° °Plantar flexors °These are muscles that push your foot down. °Sit on the floor with your R leg extended. °Loop a rubber exercise band or tube around the ball of your R foot. The ball of your foot is on the walking surface, right under your toes. The band or tube should be slightly tense when your foot is relaxed. °Slowly point your toes downward, pushing them away from   you. °Hold this position for 5 seconds. °Slowly release the tension in the band or tube, controlling smoothly until your foot is back in the starting position. °Repeat 10 times. Complete this exercise 2-3 times a day. ° °Towel curls ° °Sit in a chair on a non-carpeted surface, and put your feet on the floor. °Place a towel in front of your feet. °Keeping your heel on the floor, put your R foot on the towel. °Pull the towel toward you by grabbing the towel with your toes and curling them under. Keep your heel on the floor. °Let your toes relax. °Grab the towel again. Keep pulling the  towel until it is completely underneath your foot. °Repeat 10 times. Complete this exercise 2-3 times a day. ° °Standing plantar flexion °This is an exercise in which you use your toes to lift your body's weight while standing. °Stand with your feet shoulder-width apart. °Keep your weight spread evenly over the width of your feet while you rise up on your toes. Use a wall or table to steady yourself if needed, but try not to use it for support. °If this exercise is too easy, try these options: °Shift your weight toward your R leg until you feel challenged. °If told by your health care provider, lift your uninjured leg off the floor. °Hold this position for 5 seconds. °Repeat 10 times. Complete this exercise 2-3 times a day. ° °Tandem walking °Stand with one foot directly in front of the other. °Slowly raise your back foot up, lifting your heel before your toes, and place it directly in front of your other foot. °Continue to walk in this heel-to-toe way. Have a countertop or wall nearby to use if needed to keep your balance, but try not to hold onto anything for support. ° °Repeat 10 times. Complete this exercise 2-3 times a day. ° ° °Document Revised: 10/22/2017 Document Reviewed: 10/24/2017 °[employer]vier Patient Education © 2020 [employer] ° °

## 2021-05-04 NOTE — Telephone Encounter (Signed)
Visit with Ortho completed ?

## 2021-05-05 ENCOUNTER — Encounter: Payer: Self-pay | Admitting: Nurse Practitioner

## 2021-05-05 ENCOUNTER — Other Ambulatory Visit: Payer: Self-pay | Admitting: Nurse Practitioner

## 2021-05-05 DIAGNOSIS — B850 Pediculosis due to Pediculus humanus capitis: Secondary | ICD-10-CM

## 2021-05-05 MED ORDER — PERMETHRIN 5 % EX CREA: TOPICAL_CREAM | CUTANEOUS | 0 refills | Status: DC

## 2021-05-06 ENCOUNTER — Encounter: Payer: Medicaid Other | Admitting: Neurology

## 2021-05-06 DIAGNOSIS — M797 Fibromyalgia: Secondary | ICD-10-CM | POA: Diagnosis not present

## 2021-05-06 DIAGNOSIS — M545 Low back pain, unspecified: Secondary | ICD-10-CM | POA: Diagnosis not present

## 2021-05-06 DIAGNOSIS — M961 Postlaminectomy syndrome, not elsewhere classified: Secondary | ICD-10-CM | POA: Diagnosis not present

## 2021-05-06 DIAGNOSIS — G959 Disease of spinal cord, unspecified: Secondary | ICD-10-CM | POA: Diagnosis not present

## 2021-05-06 DIAGNOSIS — M25571 Pain in right ankle and joints of right foot: Secondary | ICD-10-CM | POA: Diagnosis not present

## 2021-05-13 ENCOUNTER — Encounter: Payer: Medicaid Other | Admitting: Orthopedic Surgery

## 2021-05-27 ENCOUNTER — Ambulatory Visit (INDEPENDENT_AMBULATORY_CARE_PROVIDER_SITE_OTHER): Payer: Medicaid Other | Admitting: Orthopedic Surgery

## 2021-05-27 ENCOUNTER — Ambulatory Visit: Payer: Medicaid Other

## 2021-05-27 ENCOUNTER — Encounter: Payer: Self-pay | Admitting: Orthopedic Surgery

## 2021-05-27 DIAGNOSIS — S92031A Displaced avulsion fracture of tuberosity of right calcaneus, initial encounter for closed fracture: Secondary | ICD-10-CM | POA: Diagnosis not present

## 2021-05-27 NOTE — Patient Instructions (Addendum)
OK to come out of the walking boot as tolerated.  ? ? ?Instructions ? ?1.  You have sustained an ankle sprain, or similar exercises that can be treated as an ankle sprain.  **These exercises can also be used as part of recovery from an ankle fracture.  ?2.  I encourage you to stay on your feet and gradually remove your walking boot.   ?3.  Below are some exercises that you can complete on your own to improve your symptoms.  ?4.  As an alternative, you can search for ankle sprain exercises online, and can see some demonstrations on YouTube  ?5.  If you are having difficulty with these exercises, we can also prescribe formal physical therapy ? ?Ankle Exercises ?Ask your health care provider which exercises are safe for you. Do exercises exactly as told by your health care provider and adjust them as directed. It is normal to feel mild stretching, pulling, tightness, or mild discomfort as you do these exercises. Stop right away if you feel sudden pain or your pain gets worse. Do not begin these exercises until told by your health care provider. ? ?Stretching and range-of-motion exercises ?These exercises warm up your muscles and joints and improve the movement and flexibility of your ankle. These exercises may also help to relieve pain. ? ?Dorsiflexion/plantar flexion ? ?Sit with your R knee straight or bent. Do not rest your foot on anything. ?Flex your left ankle to tilt the top of your foot toward your shin. This is called dorsiflexion. ?Hold this position for 5 seconds. ?Point your toes downward to tilt the top of your foot away from your shin. This is called plantar flexion. ?Hold this position for 5 seconds. ?Repeat 10 times. Complete this exercise 2-3 times a day.  As tolerated ? ?Ankle alphabet ? ?Sit with your R foot supported at your lower leg. ?Do not rest your foot on anything. ?Make sure your foot has room to move freely. ?Think of your R foot as a paintbrush: ?Move your foot to trace each letter of the  alphabet in the air. Keep your hip and knee still while you trace the letters. Trace every letter from A to Z. ?Make the letters as large as you can without causing or increasing any discomfort. ? ?Repeat 2-3 times. Complete this exercise 2-3 times a day. ? ? ?Strengthening exercises ?These exercises build strength and endurance in your ankle. Endurance is the ability to use your muscles for a long time, even after they get tired. ?Dorsiflexors ?These are muscles that lift your foot up. ?Secure a rubber exercise band or tube to an object, such as a table leg, that will stay still when the band is pulled. Secure the other end around your R foot. ?Sit on the floor, facing the object with your R leg extended. The band or tube should be slightly tense when your foot is relaxed. ?Slowly flex your R ankle and toes to bring your foot toward your shin. ?Hold this position for 5 seconds. ?Slowly return your foot to the starting position, controlling the band as you do that. ?Repeat 10 times. Complete this exercise 2-3 times a day. ? ?Plantar flexors ?These are muscles that push your foot down. ?Sit on the floor with your R leg extended. ?Loop a rubber exercise band or tube around the ball of your R foot. The ball of your foot is on the walking surface, right under your toes. The band or tube should be slightly tense when  your foot is relaxed. ?Slowly point your toes downward, pushing them away from you. ?Hold this position for 5 seconds. ?Slowly release the tension in the band or tube, controlling smoothly until your foot is back in the starting position. ?Repeat 10 times. Complete this exercise 2-3 times a day. ? ?Towel curls ? ?Sit in a chair on a non-carpeted surface, and put your feet on the floor. ?Place a towel in front of your feet. ?Keeping your heel on the floor, put your R foot on the towel. ?Pull the towel toward you by grabbing the towel with your toes and curling them under. Keep your heel on the floor. ?Let  your toes relax. ?Grab the towel again. Keep pulling the towel until it is completely underneath your foot. ?Repeat 10 times. Complete this exercise 2-3 times a day. ? ?Standing plantar flexion ?This is an exercise in which you use your toes to lift your body's weight while standing. ?Stand with your feet shoulder-width apart. ?Keep your weight spread evenly over the width of your feet while you rise up on your toes. Use a wall or table to steady yourself if needed, but try not to use it for support. ?If this exercise is too easy, try these options: ?Shift your weight toward your R leg until you feel challenged. ?If told by your health care provider, lift your uninjured leg off the floor. ?Hold this position for 5 seconds. ?Repeat 10 times. Complete this exercise 2-3 times a day. ? ?Tandem walking ?Stand with one foot directly in front of the other. ?Slowly raise your back foot up, lifting your heel before your toes, and place it directly in front of your other foot. ?Continue to walk in this heel-to-toe way. Have a countertop or wall nearby to use if needed to keep your balance, but try not to hold onto anything for support. ? ?Repeat 10 times. Complete this exercise 2-3 times a day. ? ? ?Document Revised: 10/22/2017 Document Reviewed: 10/24/2017 ?Elsevier Patient Education ? [place]. ? ?

## 2021-05-27 NOTE — Progress Notes (Signed)
Return patient Visit ? ?Assessment: ?Kellie Martinez is a 26 y.o. female with the following: ?1. Closed displaced avulsion fracture of tuberosity of right calcaneus, subsequent encounter ? ?Plan: ?Mrs. Glahn sustained an injury to her right ankle approximately 1 month ago.  Bruising, swelling and pain have improved.  She does have some persistent pain, especially some motions.  She is doing some activities on her own.  She not wearing a boot in clinic today.  I advised her she can transition out of the boot on her own.  Medications as needed.  We discussed follow-up in a month, but she is comfortable with contacting the clinic if she has any issues.  Follow-up as needed. ? ? ?Follow-up: ?Return if symptoms worsen or fail to improve. ? ?Subjective: ? ?Chief Complaint  ?Patient presents with  ? Foot Injury  ?  RIGHT DOI 05/01/21  ? ? ?History of Present Illness: ?Kellie Martinez is a 26 y.o. female who presents for evaluation of right ankle pain.  She injured her ankle approximately 1 month ago.  She is doing better.  The swelling has improved.  She has some pain persistent over the anterior aspect of her ankle.  She is working on exercises.  She is not wearing a walking boot, she was doing her exercises earlier.  She presents today wearing slippers, not complaining of pain. ? ? ?Review of Systems: ?No fevers or chills ?No numbness or tingling ?No chest pain ?No shortness of breath ?No bowel or bladder dysfunction ?No GI distress ?No headaches ? ?Objective: ?There were no vitals taken for this visit. ? ?Physical Exam: ? ?General: Alert and oriented. and No acute distress. ?Gait: Right sided antalgic gait. ? ?Right foot and ankle without swelling.  No bruising is appreciated.  Mild tenderness to palpation of the anterior medial aspect of the ankle.  Some pain with resisted inversion.  Minimal pain with resisted eversion.  She is able to get to a plantigrade position.  Sensation is intact over the dorsum of her foot.   Toes are warm and well-perfused. ? ? ?IMAGING: ?I personally reviewed images previously obtained from the ED ? ?X-ray of the right foot was obtained in clinic today.  This is a nonweightbearing view.  No acute injuries are noted.  No dislocations.  There is a small cortical fragment on the anterior lateral aspect of the ankle.  This is in a position consistent with prior x-rays. ? ?Impression: Minimally displaced avulsion fracture off the anterior lateral aspect of the right calcaneus ? ? ?New Medications:  ?No orders of the defined types were placed in this encounter. ? ? ? ? ?Mordecai Rasmussen, MD ? ?05/27/2021 ?3:13 PM ? ? ?

## 2021-06-30 DIAGNOSIS — F411 Generalized anxiety disorder: Secondary | ICD-10-CM | POA: Diagnosis not present

## 2021-06-30 DIAGNOSIS — F900 Attention-deficit hyperactivity disorder, predominantly inattentive type: Secondary | ICD-10-CM | POA: Diagnosis not present

## 2021-06-30 DIAGNOSIS — F3132 Bipolar disorder, current episode depressed, moderate: Secondary | ICD-10-CM | POA: Diagnosis not present

## 2021-06-30 DIAGNOSIS — F410 Panic disorder [episodic paroxysmal anxiety] without agoraphobia: Secondary | ICD-10-CM | POA: Diagnosis not present

## 2021-07-26 ENCOUNTER — Other Ambulatory Visit: Payer: Self-pay | Admitting: Nurse Practitioner

## 2021-07-26 DIAGNOSIS — F32A Depression, unspecified: Secondary | ICD-10-CM

## 2021-07-26 DIAGNOSIS — F319 Bipolar disorder, unspecified: Secondary | ICD-10-CM

## 2021-07-26 DIAGNOSIS — F419 Anxiety disorder, unspecified: Secondary | ICD-10-CM

## 2021-07-27 ENCOUNTER — Encounter: Payer: Self-pay | Admitting: Nurse Practitioner

## 2021-07-28 ENCOUNTER — Ambulatory Visit: Payer: Medicaid Other | Attending: Nurse Practitioner | Admitting: Nurse Practitioner

## 2021-07-28 ENCOUNTER — Encounter: Payer: Self-pay | Admitting: Nurse Practitioner

## 2021-07-28 DIAGNOSIS — F32A Depression, unspecified: Secondary | ICD-10-CM | POA: Diagnosis not present

## 2021-07-28 DIAGNOSIS — F419 Anxiety disorder, unspecified: Secondary | ICD-10-CM | POA: Diagnosis not present

## 2021-07-28 DIAGNOSIS — F319 Bipolar disorder, unspecified: Secondary | ICD-10-CM

## 2021-07-28 DIAGNOSIS — G959 Disease of spinal cord, unspecified: Secondary | ICD-10-CM

## 2021-07-28 MED ORDER — LAMOTRIGINE 150 MG PO TABS: 150.0000 mg | ORAL_TABLET | Freq: Two times a day (BID) | ORAL | 6 refills | Status: DC

## 2021-07-28 NOTE — Progress Notes (Signed)
Virtual Visit via Telephone Note  I discussed the limitations, risks, security and privacy concerns of performing an evaluation and management service by telephone and the availability of in person appointments. I also discussed with the patient that there may be a patient responsible charge related to this service. The patient expressed understanding and agreed to proceed.    I connected with Kellie Martinez on 07/28/21  at  10:50 AM EDT  EDT by telephone and verified that I am speaking with the correct person using two identifiers.  Location of Patient: Private Residence   Location of Provider: [place] [facility] [place] participating in Telemedicine visit: [name] FNP-BC Kellie Martinez    History of Present Illness: Telemedicine visit for: Mood stabilizer  She is requesting refill of lamictal. Currently seeing a psychiatrist through [facility]. States she has been prescribed a few other medications for her mood disorder however she can not recall the names. She assures me her current therapist is aware she is taking Lamictal and Trazodone.     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   Chronic kidney disease    Depression    GERD (gastroesophageal reflux disease)    Headache    otc med prn   History of kidney stones    HLD (hyperlipidemia)    diet controlled   Molar pregnancy    Pneumonia    x 1 yrs ago per patient 11/22/19   Renal calculus or stone    had stent/ lithotripsy in past   Sleep apnea    does not use cpap   Substance abuse ([place])    Suicide attempt ([place])    x 2    Past Surgical History:  Procedure Laterality Date   CERVICAL DISC ARTHROPLASTY N/A 11/26/2019   Procedure: CERVICAL DISC ARTHROPLASTY C5-6 WITH PRO [place];  Surgeon: Jessy Oto, MD;  Location: [facility];  Service: Orthopedics;   Laterality: N/A;   CYSTOSCOPY     x2 with stone extraction   EXTRACORPOREAL SHOCK WAVE LITHOTRIPSY Right 06/20/2013   Procedure: EXTRACORPOREAL SHOCK WAVE LITHOTRIPSY (ESWL);  Surgeon: Edwin Dada, MD;  Location: [facility];  Service: Urology;  Laterality: Right;   FOOT SURGERY Left 2003   LIPOMA EXCISION Right 11/19/2020   Procedure: EXCISION LIPOMA;  Surgeon: Virl Cagey, MD;  Location: [facility];  Service: General;  Laterality: Right;   LITHOTRIPSY     TUBAL LIGATION  2017   TUBAL LIGATION     UMBILICAL HERNIA REPAIR N/A 03/28/2020   Procedure: HERNIA REPAIR UMBILICAL ADULT;  Surgeon: Virl Cagey, MD;  Location: [facility];  Service: General;  Laterality: N/A;    Family History  Problem Relation Age of Onset   Depression Mother    Asthma Mother    Hypertension Mother    Mood Disorder Mother    Heart disease Father    Kidney disease Father    Alcoholism Father    Breast cancer Paternal Aunt    Breast cancer Maternal Grandmother    Breast cancer Cousin    Alcoholism Brother    Drug abuse Brother     Social History   Socioeconomic History   Marital status: Single    Spouse name: Not on file   Number of children: 3  Years of education: Not on file   Highest education level: Not on file  Occupational History   Not on file  Tobacco Use   Smoking status: Every Day    Packs/day: 0.25    Types: Cigarettes   Smokeless tobacco: Never   Tobacco comments:    3 cig/day  Vaping Use   Vaping Use: Former   Quit date: 02/01/2019  Substance and Sexual Activity   Alcohol use: Not Currently    Comment:  weekends wine/liquor   Drug use: Not Currently    Types: Marijuana    Comment: Last use - 11/17/19   Sexual activity: Yes    Birth control/protection: Surgical    Comment: Tubal  Other Topics Concern   Not on file  Social History Narrative   Not on file   Social Determinants of Health   Financial Resource Strain: Not on file  Food Insecurity: Not on file   Transportation Needs: Not on file  Physical Activity: Not on file  Stress: Not on file  Social Connections: Not on file     Observations/Objective: Awake, alert and oriented x 3   Review of Systems  Constitutional:  Negative for fever, malaise/fatigue and weight loss.  HENT: Negative.  Negative for nosebleeds.   Eyes: Negative.  Negative for blurred vision, double vision and photophobia.  Respiratory: Negative.  Negative for cough and shortness of breath.   Cardiovascular: Negative.  Negative for chest pain, palpitations and leg swelling.  Gastrointestinal: Negative.  Negative for heartburn, nausea and vomiting.  Musculoskeletal: Negative.  Negative for myalgias.  Neurological: Negative.  Negative for dizziness, focal weakness, seizures and headaches.  Psychiatric/Behavioral:  Positive for depression. Negative for suicidal ideas. The patient is nervous/anxious.     Assessment and Plan: Diagnoses and all orders for this visit:  Anxiety and depression -     lamoTRIgine (LAMICTAL) 150 MG tablet; Take 1 tablet (150 mg total) by mouth 2 (two) times daily.  Bipolar depression (HCC) -     lamoTRIgine (LAMICTAL) 150 MG tablet; Take 1 tablet (150 mg total) by mouth 2 (two) times daily.  Cervical myelopathy ([place]) Follow up with Ortho as instructed    Follow Up Instructions Return if symptoms worsen or fail to improve.     I discussed the assessment and treatment plan with the patient. The patient was provided an opportunity to ask questions and all were answered. The patient agreed with the plan and demonstrated an understanding of the instructions.   The patient was advised to call back or seek an in-person evaluation if the symptoms worsen or if the condition fails to improve as anticipated.  I provided 8 minutes of non-face-to-face time during this encounter including median intraservice time, reviewing previous notes, labs, imaging, medications and explaining diagnosis and  management.  Gildardo Pounds, FNP-BC

## 2021-08-19 ENCOUNTER — Emergency Department (HOSPITAL_COMMUNITY): Payer: Medicaid Other

## 2021-08-19 ENCOUNTER — Other Ambulatory Visit: Payer: Self-pay

## 2021-08-19 ENCOUNTER — Encounter (HOSPITAL_COMMUNITY): Payer: Self-pay | Admitting: *Deleted

## 2021-08-19 ENCOUNTER — Emergency Department (HOSPITAL_COMMUNITY)
Admission: EM | Admit: 2021-08-19 | Discharge: 2021-08-19 | Disposition: A | Discharge status: Home / Self Care | Payer: Medicaid Other | Attending: Emergency Medicine | Admitting: Emergency Medicine

## 2021-08-19 DIAGNOSIS — S93432A Sprain of tibiofibular ligament of left ankle, initial encounter: Secondary | ICD-10-CM | POA: Diagnosis not present

## 2021-08-19 DIAGNOSIS — N189 Chronic kidney disease, unspecified: Secondary | ICD-10-CM | POA: Diagnosis not present

## 2021-08-19 DIAGNOSIS — S93492A Sprain of other ligament of left ankle, initial encounter: Secondary | ICD-10-CM | POA: Insufficient documentation

## 2021-08-19 DIAGNOSIS — S99912A Unspecified injury of left ankle, initial encounter: Secondary | ICD-10-CM | POA: Diagnosis present

## 2021-08-19 DIAGNOSIS — J45909 Unspecified asthma, uncomplicated: Secondary | ICD-10-CM | POA: Insufficient documentation

## 2021-08-19 DIAGNOSIS — M25572 Pain in left ankle and joints of left foot: Secondary | ICD-10-CM | POA: Diagnosis not present

## 2021-08-19 DIAGNOSIS — Y9389 Activity, other specified: Secondary | ICD-10-CM | POA: Insufficient documentation

## 2021-08-19 DIAGNOSIS — W228XXA Striking against or struck by other objects, initial encounter: Secondary | ICD-10-CM | POA: Diagnosis not present

## 2021-08-19 NOTE — ED Triage Notes (Signed)
Pt  in c/o L ankle pain with injury with foot twisting today at work today, pt has ankle sleeve in place upon arrival to ED, pt reports pain with bear weight, A&O x4

## 2021-08-19 NOTE — Discharge Instructions (Addendum)
Please return to the ED with any new symptom such as numbness or tingling of the left foot Please follow-up with your PCP in the next 1 week for further management Please use the cam boot that you have at home on your left ankle as we discussed.  Please also utilize the crutches that you have at home.  Please avoid weightbearing on this foot for the next 2 to 3 days and then you may increase activity as tolerated.  Please read the attached informational guide concerning ankle sprains Please utilize RICE method at home.  Rest, ice, compression and elevation.  Please also alternate ibuprofen and Tylenol as we discussed for best pain relief.

## 2021-08-19 NOTE — ED Provider Notes (Addendum)
[facility] EMERGENCY DEPARTMENT Provider Note   CSN: [id number] Arrival date & time: 08/19/21  1150     History  Chief Complaint  Patient presents with   Ankle Pain    Kellie Martinez is a 26 y.o. female with medical history of kidney stones, pneumonia, substance abuse, depression, CKD, bipolar 1 disorder, asthma.  Patient presents to ED for evaluation of left-sided ankle pain.  Patient reports prior to arrival she was at work when she stepped off of a step and rolled her left ankle.  The patient states that she rolled onto the lateral side of her left ankle and reports immediate pain at this time.  Patient describes the pain is "all over my ankle".  Patient denies taking anything prior to arrival for pain control.  Patient denies any numbness or tingling to the foot.   Ankle Pain      Home Medications Prior to Admission medications   Medication Sig Start Date End Date Taking? Authorizing Provider  albuterol (VENTOLIN HFA) 108 (90 Base) MCG/ACT inhaler Inhale 2 puffs into the lungs every 6 (six) hours as needed for wheezing or shortness of breath. 10/22/20   Gildardo Pounds, NP  gabapentin (NEURONTIN) 300 MG capsule Take 1 capsule (300 mg total) by mouth 3 (three) times daily. 10/14/20   Gildardo Pounds, NP  ibuprofen (ADVIL) 800 MG tablet Take 1 tablet (800 mg total) by mouth every 8 (eight) hours as needed. 04/28/21   Fransico Meadow, PA-C  lamoTRIgine (LAMICTAL) 150 MG tablet Take 1 tablet (150 mg total) by mouth 2 (two) times daily. 07/28/21   Gildardo Pounds, NP  ondansetron (ZOFRAN) 4 MG tablet Take 1 tablet (4 mg total) by mouth every 8 (eight) hours as needed. 11/19/20 11/19/21  Virl Cagey, MD  oxyCODONE (ROXICODONE) 5 MG immediate release tablet Take 1 tablet (5 mg total) by mouth every 4 (four) hours as needed for breakthrough pain or severe pain. 11/24/20 11/24/21  Virl Cagey, MD  permethrin (ELIMITE) 5 % cream Prior to application, wash hair with  conditioner-free shampoo; rinse with water and towel dry. Apply a sufficient amount of lotion or cream rinse to saturate the hair and scalp. Leave on hair for 10 minutes ONLY, then rinse off with warm water; remove remaining nits with nit comb. A single application is generally sufficient; however, may repeat 7 days after first treatment if lice or nits are still present. 05/05/21   Gildardo Pounds, NP  traZODone (DESYREL) 100 MG tablet TAKE 2 TABLETS BY MOUTH AT BEDTIME 01/11/21   Gildardo Pounds, NP      Allergies    5-alpha reductase inhibitors, Chloramphenicols, Levofloxacin, Penicillins, Phenergan [promethazine hcl], and Seroquel [quetiapine]    Review of Systems   Review of Systems  Musculoskeletal:  Positive for arthralgias and myalgias.  Neurological:  Negative for numbness.  All other systems reviewed and are negative.   Physical Exam Updated Vital Signs BP 113/85 (BP Location: Right Arm)   Pulse 86   Temp 98 F (36.7 C) (Oral)   Resp 20   Ht '5\' 1"'$  (1.549 m)   LMP 08/07/2021 (Exact Date)   SpO2 95%   BMI 37.79 kg/m  Physical Exam Vitals and nursing note reviewed.  Constitutional:      General: She is not in acute distress.    Appearance: Normal appearance. She is not ill-appearing, toxic-appearing or diaphoretic.  HENT:     Head: Normocephalic and atraumatic.  Nose: Nose normal. No congestion.     Mouth/Throat:     Mouth: Mucous membranes are moist.     Pharynx: Oropharynx is clear.  Eyes:     Extraocular Movements: Extraocular movements intact.     Conjunctiva/sclera: Conjunctivae normal.     Pupils: Pupils are equal, round, and reactive to light.  Cardiovascular:     Rate and Rhythm: Normal rate and regular rhythm.  Pulmonary:     Effort: Pulmonary effort is normal.     Breath sounds: Normal breath sounds. No wheezing.  Abdominal:     General: Abdomen is flat. Bowel sounds are normal.     Palpations: Abdomen is soft.     Tenderness: There is no abdominal  tenderness.  Musculoskeletal:     Cervical back: Normal range of motion and neck supple. No tenderness.     Right ankle: Normal.     Left ankle: Swelling present. No deformity, ecchymosis or lacerations. Tenderness present over the ATF ligament. Decreased range of motion.     Comments: Decreased range of motion of left ankle.  No overlying skin change however there is swelling.  The patient has a 2+ DP pulse in the left foot.  Patient has decreased range of motion actively however passively she is able to range her ankle with pain.  Skin:    General: Skin is warm and dry.     Capillary Refill: Capillary refill takes less than 2 seconds.  Neurological:     Mental Status: She is alert and oriented to person, place, and time.     ED Results / Procedures / Treatments   Labs (all labs ordered are listed, but only abnormal results are displayed) Labs Reviewed - No data to display  EKG None  Radiology DG Ankle Complete Left  Result Date: 08/19/2021 CLINICAL DATA:  Left ankle pain after twisting injury. EXAM: LEFT ANKLE COMPLETE - 3+ VIEW COMPARISON:  None Available. FINDINGS: No acute fracture or dislocation. No aggressive osseous lesion. Normal alignment. Soft tissue swelling over the lateral malleolus. No radiopaque foreign body or soft tissue emphysema. IMPRESSION: 1. No acute osseous injury of the left ankle. Electronically Signed   By: Kathreen Devoid M.D.   On: 08/19/2021 12:28    Procedures Procedures   Medications Ordered in ED Medications - No data to display  ED Course/ Medical Decision Making/ A&P                           Medical Decision Making Amount and/or Complexity of Data Reviewed Radiology: ordered.   26 year old female presents to the ED for evaluation.  Please see HPI for further details.  On examination, the patient's left ankle is swollen without overlying skin change.  There is no ecchymosis, erythema.  Patient has a 2+ DP pulse in the left foot.  There is  decreased range of motion actively however passively she is able to allow me to range her ankle.  There is no crepitus, deformity noted.  The patient states that she is unable to bear weight on the left ankle.  Patient reports that the pain is worse on the lateral side of her left ankle than medial side.  This is consistent with ATFL ankle sprain.  Plain film imaging of left ankle shows no fracture, deformity, dislocation.  There is noted soft tissue swelling.  Patient will be placed in cam boot and advised to follow-up with PCP for further management.  The patient  was offered crutches and Cam boot however she states that she has the supplies at home due to ankle sprain of her right ankle last year.  Patient advised to continue care at home utilizing RICE method.  Patient advised to continue using ice and to switch to heat after 48 hours.  Patient advised to alternate ibuprofen and Tylenol for best analgesic effect.  Patient given return precautions and she voiced understanding with these.  The patient had all of her questions answered to her satisfaction.  The patient is stable at this time for discharge home.  Final Clinical Impression(s) / ED Diagnoses Final diagnoses:  Sprain of anterior talofibular ligament of left ankle, initial encounter    Rx / DC Orders ED Discharge Orders     None             Lawana Chambers 08/19/21 1344    Godfrey Pick, MD 08/21/21 1202

## 2021-09-01 ENCOUNTER — Encounter: Payer: Self-pay | Admitting: Nurse Practitioner

## 2021-09-02 ENCOUNTER — Other Ambulatory Visit: Payer: Self-pay | Admitting: Nurse Practitioner

## 2021-09-02 DIAGNOSIS — Z91030 Bee allergy status: Secondary | ICD-10-CM

## 2021-09-02 MED ORDER — EPINEPHRINE 0.3 MG/0.3ML IJ SOAJ: 0.3000 mg | INTRAMUSCULAR | 1 refills | Status: DC | PRN

## 2021-09-29 DIAGNOSIS — F411 Generalized anxiety disorder: Secondary | ICD-10-CM | POA: Diagnosis not present

## 2021-09-29 DIAGNOSIS — F900 Attention-deficit hyperactivity disorder, predominantly inattentive type: Secondary | ICD-10-CM | POA: Diagnosis not present

## 2021-09-29 DIAGNOSIS — F3132 Bipolar disorder, current episode depressed, moderate: Secondary | ICD-10-CM | POA: Diagnosis not present

## 2021-09-29 DIAGNOSIS — F410 Panic disorder [episodic paroxysmal anxiety] without agoraphobia: Secondary | ICD-10-CM | POA: Diagnosis not present

## 2021-10-31 ENCOUNTER — Other Ambulatory Visit: Payer: Self-pay | Admitting: Nurse Practitioner

## 2021-10-31 DIAGNOSIS — Z91030 Bee allergy status: Secondary | ICD-10-CM
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
## 2021-11-02 NOTE — Telephone Encounter (Signed)
Requested medication (s) are due for refill todayyes  Requested medication (s) are on the active medication list: yes  Last refill:  09/02/21  Future visit scheduled: yes  Notes to clinic:  Unable to refill per protocol, pharmacy comment: rx was filled 10/31/21, routing for approval.     Requested Prescriptions  Pending Prescriptions Disp Refills   EPINEPHRINE 0.3 mg/0.3 mL IJ SOAJ injection [Pharmacy Med Name: epinephrine 0.3 mg/0.3 mL injection, auto-injector] 2 each 1    Sig: Inject 0.3 mg into the muscle as needed for anaphylaxis.     Immunology: Antidotes Passed - 10/31/2021 12:09 PM      Passed - Valid encounter within last 12 months    Recent Outpatient Visits           3 months ago Anxiety and depression   [place], Vernia Buff, NP   7 months ago Stress incontinence   [place], Vernia Buff, NP   11 months ago Anxiety and depression   [place], Vernia Buff, NP   1 year ago Lipoma of other specified sites   [facility], MD   1 year ago Umbilical hernia without obstruction and without gangrene   [place], Zelda W, NP

## 2021-11-12 ENCOUNTER — Encounter: Payer: Self-pay | Admitting: Nurse Practitioner

## 2021-12-24 DIAGNOSIS — F411 Generalized anxiety disorder: Secondary | ICD-10-CM | POA: Diagnosis not present

## 2021-12-24 DIAGNOSIS — F3132 Bipolar disorder, current episode depressed, moderate: Secondary | ICD-10-CM | POA: Diagnosis not present

## 2021-12-24 DIAGNOSIS — F410 Panic disorder [episodic paroxysmal anxiety] without agoraphobia: Secondary | ICD-10-CM | POA: Diagnosis not present

## 2021-12-24 DIAGNOSIS — F900 Attention-deficit hyperactivity disorder, predominantly inattentive type: Secondary | ICD-10-CM | POA: Diagnosis not present

## 2021-12-28 DIAGNOSIS — Z79899 Other long term (current) drug therapy: Secondary | ICD-10-CM | POA: Diagnosis not present

## 2021-12-28 DIAGNOSIS — Z5181 Encounter for therapeutic drug level monitoring: Secondary | ICD-10-CM | POA: Diagnosis not present

## 2022-01-28 DIAGNOSIS — F410 Panic disorder [episodic paroxysmal anxiety] without agoraphobia: Secondary | ICD-10-CM | POA: Diagnosis not present

## 2022-01-28 DIAGNOSIS — F411 Generalized anxiety disorder: Secondary | ICD-10-CM | POA: Diagnosis not present

## 2022-01-28 DIAGNOSIS — F900 Attention-deficit hyperactivity disorder, predominantly inattentive type: Secondary | ICD-10-CM | POA: Diagnosis not present

## 2022-01-28 DIAGNOSIS — F3132 Bipolar disorder, current episode depressed, moderate: Secondary | ICD-10-CM | POA: Diagnosis not present

## 2022-02-25 DIAGNOSIS — F410 Panic disorder [episodic paroxysmal anxiety] without agoraphobia: Secondary | ICD-10-CM | POA: Diagnosis not present

## 2022-02-25 DIAGNOSIS — F411 Generalized anxiety disorder: Secondary | ICD-10-CM | POA: Diagnosis not present

## 2022-02-25 DIAGNOSIS — F3132 Bipolar disorder, current episode depressed, moderate: Secondary | ICD-10-CM | POA: Diagnosis not present

## 2022-02-25 DIAGNOSIS — F900 Attention-deficit hyperactivity disorder, predominantly inattentive type: Secondary | ICD-10-CM | POA: Diagnosis not present

## 2022-03-25 DIAGNOSIS — W19XXXA Unspecified fall, initial encounter: Secondary | ICD-10-CM | POA: Diagnosis not present

## 2022-03-25 DIAGNOSIS — Z23 Encounter for immunization: Secondary | ICD-10-CM | POA: Diagnosis not present

## 2022-03-25 DIAGNOSIS — W06XXXA Fall from bed, initial encounter: Secondary | ICD-10-CM | POA: Diagnosis not present

## 2022-03-25 DIAGNOSIS — S0101XA Laceration without foreign body of scalp, initial encounter: Secondary | ICD-10-CM | POA: Diagnosis not present

## 2022-03-25 DIAGNOSIS — R58 Hemorrhage, not elsewhere classified: Secondary | ICD-10-CM | POA: Diagnosis not present

## 2022-03-25 DIAGNOSIS — S0990XA Unspecified injury of head, initial encounter: Secondary | ICD-10-CM | POA: Diagnosis not present

## 2022-03-25 DIAGNOSIS — F17210 Nicotine dependence, cigarettes, uncomplicated: Secondary | ICD-10-CM | POA: Diagnosis not present

## 2022-03-25 DIAGNOSIS — S0993XA Unspecified injury of face, initial encounter: Secondary | ICD-10-CM | POA: Diagnosis not present

## 2022-03-25 DIAGNOSIS — G4489 Other headache syndrome: Secondary | ICD-10-CM | POA: Diagnosis not present

## 2022-03-31 DIAGNOSIS — Z4802 Encounter for removal of sutures: Secondary | ICD-10-CM | POA: Diagnosis not present

## 2022-04-06 DIAGNOSIS — Z4802 Encounter for removal of sutures: Secondary | ICD-10-CM | POA: Diagnosis not present

## 2022-04-06 DIAGNOSIS — S0000XD Unspecified superficial injury of scalp, subsequent encounter: Secondary | ICD-10-CM | POA: Diagnosis not present

## 2022-04-08 ENCOUNTER — Encounter: Payer: Self-pay | Admitting: Radiology

## 2022-04-22 DIAGNOSIS — F3132 Bipolar disorder, current episode depressed, moderate: Secondary | ICD-10-CM | POA: Diagnosis not present

## 2022-04-22 DIAGNOSIS — F410 Panic disorder [episodic paroxysmal anxiety] without agoraphobia: Secondary | ICD-10-CM | POA: Diagnosis not present

## 2022-04-22 DIAGNOSIS — F411 Generalized anxiety disorder: Secondary | ICD-10-CM | POA: Diagnosis not present

## 2022-04-22 DIAGNOSIS — F900 Attention-deficit hyperactivity disorder, predominantly inattentive type: Secondary | ICD-10-CM | POA: Diagnosis not present

## 2022-05-31 ENCOUNTER — Ambulatory Visit: Payer: Medicaid Other | Admitting: Nurse Practitioner

## 2022-06-14 ENCOUNTER — Ambulatory Visit: Payer: Self-pay
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
## 2022-06-17 ENCOUNTER — Encounter: Payer: Self-pay | Admitting: Family Medicine

## 2022-06-17 ENCOUNTER — Ambulatory Visit: Payer: Medicaid Other | Attending: Family Medicine | Admitting: Family Medicine

## 2022-06-17 VITALS — BP 117/80 | HR 78 | Ht 61.0 in | Wt 198.8 lb

## 2022-06-17 DIAGNOSIS — S0990XD Unspecified injury of head, subsequent encounter: Secondary | ICD-10-CM | POA: Diagnosis not present

## 2022-06-17 DIAGNOSIS — R519 Headache, unspecified: Secondary | ICD-10-CM

## 2022-06-17 MED ORDER — GABAPENTIN 300 MG PO CAPS: 300.0000 mg | ORAL_CAPSULE | Freq: Every day | ORAL | 1 refills | Status: DC

## 2022-06-17 NOTE — Patient Instructions (Signed)
Neuropathic Pain Neuropathic pain is pain caused by damage to the nerves that are responsible for certain sensations in your body (sensory nerves). Neuropathic pain can make you more sensitive to pain. Even a minor sensation can feel very painful. This is usually a long-term (chronic) condition that can be difficult to treat. The type of pain differs from person to person. It may: Start suddenly (acute), or it may develop slowly and become chronic. Come and go as damaged nerves heal, or it may stay at the same level for years. Cause emotional distress, loss of sleep, and a lower quality of life. What are the causes? The most common cause of this condition is diabetes. Many other diseases and conditions can also cause neuropathic pain. Causes of neuropathic pain can be classified as: Toxic. This is caused by medicines and chemicals. The most common causes of toxic neuropathic pain is damage from medicines that kill cancer cells (chemotherapy) or alcohol abuse. Metabolic. This can be caused by: Diabetes. Lack of vitamins like B12. Traumatic. Any injury that cuts, crushes, or stretches a nerve can cause damage and pain. Compression-related. If a sensory nerve gets trapped or compressed for a long period of time, the blood supply to the nerve can be cut off. Vascular. Many blood vessel diseases can cause neuropathic pain by decreasing blood supply and oxygen to nerves. Autoimmune. This type of pain results from diseases in which the body's defense system (immune system) mistakenly attacks sensory nerves. Examples of autoimmune diseases that can cause neuropathic pain include lupus and multiple sclerosis. Infectious. Many types of viral infections can damage sensory nerves and cause pain. Shingles infection is a common cause of this type of pain. Inherited. Neuropathic pain can be a symptom of many diseases that are passed down through families (genetic). What increases the risk? You are more likely to  develop this condition if: You have diabetes. You smoke. You drink too much alcohol. You are taking certain medicines, including chemotherapy or medicines that treat immune system disorders. What are the signs or symptoms? The main symptom is pain. Neuropathic pain is often described as: Burning. Shock-like. Stinging. Hot or cold. Itching. How is this diagnosed? No single test can diagnose neuropathic pain. It is diagnosed based on: A physical exam and your symptoms. Your health care provider will ask you about your pain. You may be asked to use a pain scale to describe how bad your pain is. Tests. These may be done to see if you have a cause and location of any nerve damage. They include: Nerve conduction studies and electromyography to test how well nerve signals travel through your nerves and muscles (electrodiagnostic testing). Skin biopsy to evaluate for small fiber neuropathy. Imaging studies, such as: X-rays. CT scan. MRI. How is this treated? Treatment for neuropathic pain may change over time. You may need to try different treatment options or a combination of treatments. Some options include: Treating the underlying cause of the neuropathy, such as diabetes, kidney disease, or vitamin deficiencies. Stopping medicines that can cause neuropathy, such as chemotherapy. Medicine to relieve pain. Medicines may include: Prescription or over-the-counter pain medicine. Anti-seizure medicine. Antidepressant medicines. Pain-relieving patches or creams that are applied to painful areas of skin. A medicine to numb the area (local anesthetic), which can be injected as a nerve block. Transcutaneous nerve stimulation. This uses electrical currents to block painful nerve signals. The treatment is painless. Alternative treatments, such as: Acupuncture. Meditation. Massage. Occupational or physical therapy. Pain management programs. Counseling. Follow   these instructions at  home: Medicines  Take over-the-counter and prescription medicines only as told by your health care provider. Ask your health care provider if the medicine prescribed to you: Requires you to avoid driving or using machinery. Can cause constipation. You may need to take these actions to prevent or treat constipation: Drink enough fluid to keep your urine pale yellow. Take over-the-counter or prescription medicines. Eat foods that are high in fiber, such as beans, whole grains, and fresh fruits and vegetables. Limit foods that are high in fat and processed sugars, such as fried or sweet foods. Lifestyle  Have a good support system at home. Consider joining a chronic pain support group. Do not use any products that contain nicotine or tobacco. These products include cigarettes, chewing tobacco, and vaping devices, such as e-cigarettes. If you need help quitting, ask your health care provider. Do not drink alcohol. General instructions Learn as much as you can about your condition. Work closely with all your health care providers to find the treatment plan that works best for you. Ask your health care provider what activities are safe for you. Keep all follow-up visits. This is important. Contact a health care provider if: Your pain treatments are not working. You are having side effects from your medicines. You are struggling with tiredness (fatigue), mood changes, depression, or anxiety. Get help right away if: You have thoughts of hurting yourself. Get help right away if you feel like you may hurt yourself or others, or have thoughts about taking your own life. Go to your nearest emergency room or: Call 911. Call the National Suicide Prevention Lifeline at 1-800-273-8255 or 988. This is open 24 hours a day. Text the Crisis Text Line at 741741. Summary Neuropathic pain is pain caused by damage to the nerves that are responsible for certain sensations in your body (sensory  nerves). Neuropathic pain may come and go as damaged nerves heal, or it may stay at the same level for years. Neuropathic pain is usually a long-term condition that can be difficult to treat. Consider joining a chronic pain support group. This information is not intended to replace advice given to you by your health care provider. Make sure you discuss any questions you have with your health care provider. Document Revised: 09/22/2020 Document Reviewed: 09/22/2020 Elsevier Patient Education  2023 [employer]  

## 2022-06-17 NOTE — Progress Notes (Signed)
Having headaches ever sine removal of staples back in February.
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
## 2022-06-29 DIAGNOSIS — F3132 Bipolar disorder, current episode depressed, moderate: Secondary | ICD-10-CM | POA: Diagnosis not present

## 2022-06-29 DIAGNOSIS — F900 Attention-deficit hyperactivity disorder, predominantly inattentive type: Secondary | ICD-10-CM | POA: Diagnosis not present

## 2022-06-29 DIAGNOSIS — F411 Generalized anxiety disorder: Secondary | ICD-10-CM | POA: Diagnosis not present

## 2022-06-29 DIAGNOSIS — Z79899 Other long term (current) drug therapy: Secondary | ICD-10-CM | POA: Diagnosis not present

## 2022-06-29 DIAGNOSIS — F410 Panic disorder [episodic paroxysmal anxiety] without agoraphobia: Secondary | ICD-10-CM | POA: Diagnosis not present

## 2022-06-29 DIAGNOSIS — Z5181 Encounter for therapeutic drug level monitoring: Secondary | ICD-10-CM | POA: Diagnosis not present

## 2022-08-04 ENCOUNTER — Other Ambulatory Visit: Payer: Self-pay | Admitting: Family Medicine

## 2022-08-04 DIAGNOSIS — S0990XD Unspecified injury of head, subsequent encounter: Secondary | ICD-10-CM

## 2022-08-04 DIAGNOSIS — R519 Headache, unspecified: Secondary | ICD-10-CM

## 2022-09-15 ENCOUNTER — Encounter: Payer: Self-pay | Admitting: Nurse Practitioner

## 2022-09-25 DIAGNOSIS — S01112A Laceration without foreign body of left eyelid and periocular area, initial encounter: Secondary | ICD-10-CM | POA: Diagnosis not present

## 2022-09-25 DIAGNOSIS — Z23 Encounter for immunization: Secondary | ICD-10-CM | POA: Diagnosis not present

## 2022-09-25 DIAGNOSIS — Z87891 Personal history of nicotine dependence: Secondary | ICD-10-CM | POA: Diagnosis not present

## 2022-09-29 ENCOUNTER — Other Ambulatory Visit: Payer: Self-pay | Admitting: Nurse Practitioner

## 2022-09-29 DIAGNOSIS — R399 Unspecified symptoms and signs involving the genitourinary system: Secondary | ICD-10-CM

## 2022-10-18 ENCOUNTER — Ambulatory Visit: Payer: Medicaid Other | Admitting: Nurse Practitioner

## 2022-11-05 ENCOUNTER — Ambulatory Visit: Payer: Medicaid Other | Admitting: Nurse Practitioner

## 2022-11-11 DIAGNOSIS — Z5181 Encounter for therapeutic drug level monitoring: Secondary | ICD-10-CM | POA: Diagnosis not present

## 2022-11-11 DIAGNOSIS — F410 Panic disorder [episodic paroxysmal anxiety] without agoraphobia: Secondary | ICD-10-CM | POA: Diagnosis not present

## 2022-11-11 DIAGNOSIS — Z79899 Other long term (current) drug therapy: Secondary | ICD-10-CM | POA: Diagnosis not present

## 2022-11-11 DIAGNOSIS — F3132 Bipolar disorder, current episode depressed, moderate: Secondary | ICD-10-CM | POA: Diagnosis not present

## 2022-11-11 DIAGNOSIS — F411 Generalized anxiety disorder: Secondary | ICD-10-CM | POA: Diagnosis not present

## 2022-11-11 DIAGNOSIS — F900 Attention-deficit hyperactivity disorder, predominantly inattentive type: Secondary | ICD-10-CM | POA: Diagnosis not present

## 2022-11-12 ENCOUNTER — Ambulatory Visit: Payer: Medicaid Other | Attending: Nurse Practitioner | Admitting: Nurse Practitioner

## 2022-11-12 ENCOUNTER — Encounter: Payer: Self-pay | Admitting: Nurse Practitioner

## 2022-11-12 VITALS — BP 113/79 | HR 83 | Ht 61.0 in | Wt 203.6 lb

## 2022-11-12 DIAGNOSIS — N3001 Acute cystitis with hematuria: Secondary | ICD-10-CM

## 2022-11-12 DIAGNOSIS — R102 Pelvic and perineal pain: Secondary | ICD-10-CM | POA: Diagnosis not present

## 2022-11-12 DIAGNOSIS — R519 Headache, unspecified: Secondary | ICD-10-CM

## 2022-11-12 DIAGNOSIS — R8290 Unspecified abnormal findings in urine: Secondary | ICD-10-CM

## 2022-11-12 DIAGNOSIS — R7989 Other specified abnormal findings of blood chemistry: Secondary | ICD-10-CM

## 2022-11-12 LAB — POCT URINALYSIS DIP (CLINITEK)
Bilirubin, UA: NEGATIVE
Glucose, UA: NEGATIVE mg/dL
Ketones, POC UA: NEGATIVE mg/dL
Nitrite, UA: POSITIVE — AB
POC PROTEIN,UA: 30 — AB
Spec Grav, UA: 1.025 (ref 1.010–1.025)
Urobilinogen, UA: 0.2 U/dL
pH, UA: 7 (ref 5.0–8.0)

## 2022-11-12 MED ORDER — NITROFURANTOIN MONOHYD MACRO 100 MG PO CAPS: 100.0000 mg | ORAL_CAPSULE | Freq: Two times a day (BID) | ORAL | 0 refills | Status: AC

## 2022-11-12 MED ORDER — BUTALBITAL-APAP-CAFFEINE 50-325-40 MG PO TABS: 1.0000 | ORAL_TABLET | Freq: Four times a day (QID) | ORAL | 1 refills | Status: DC | PRN

## 2022-11-12 NOTE — Progress Notes (Signed)
Assessment & Plan:  [name] was seen today for abdominal pain.  Diagnoses and all orders for this visit:  Foul smelling urine -     POCT URINALYSIS DIP (CLINITEK) Keflex 500 mg BID  Pelvic pain If persistent after abx. May need pelvic US.   Occipital headache -     butalbital-acetaminophen-caffeine (FIORICET) 50-325-40 MG tablet; Take 1-2 tablets by mouth every 6 (six) hours as needed for headache. -     CBC with Differential -     Ambulatory referral to Neurology  Acute cystitis with hematuria -     nitrofurantoin, macrocrystal-monohydrate, (MACROBID) 100 MG capsule; Take 1 capsule (100 mg total) by mouth 2 (two) times daily for 5 days.  Elevated LFTs -     CMP14+EGFR    Patient has been counseled on age-appropriate routine health concerns for screening and prevention. These are reviewed and up-to-date. Referrals have been placed accordingly. Immunizations are up-to-date or declined.    Subjective:   Chief Complaint  Patient presents with   Abdominal Pain    Lower abdominal pain Gabapentin not working Foul smelling urine   HPI Kellie Martinez 27 y.o. female presents to office today with UTI symptoms, pelvic pain and chronic headaches.  She has a past medical history of Anemia, Anxiety, Arthritis, Asthma, Bipolar 1 disorder, Breast tumor (03/2018), Chronic kidney disease, Depression, GERD, Headache, History of kidney stones, HLD, Molar pregnancy, Pneumonia, Renal calculus or stone, Sleep apnea, Substance abuse, and Suicide attempt (HCC).   Abdominal Pain: Patient complains of abdominal pain. The pain is described as feeling like someone is making a knot in my stomach. Pain is located in the suprapubic and pelvic area without radiation. Onset was several days ago. Symptoms have been unchanged since. Aggravating factors: none.  Alleviating factors: none. Associated symptoms: foul smelling urine. The patient denies fever, hematochezia, melena, nausea, and  vomiting.   HEADACHES Chronic in nature. Ongoing for several years. She was referred to neurology several years ago and was lost to follow up. Relieving factors: going into a dark room, silence. Location: occipital and at the top of her head. Describes the headaches as aching to throbbing. Occasionally the headache pain will occur bilaterally in the temples of her head or radiate down to her neck. Headaches are constant.  She states the headaches started after she hit her head from a fall in the past. She does have a history of cervicalgia.    Review of Systems  Constitutional:  Negative for fever, malaise/fatigue and weight loss.  HENT: Negative.  Negative for nosebleeds.   Eyes: Negative.  Negative for blurred vision, double vision and photophobia.  Respiratory: Negative.  Negative for cough and shortness of breath.   Cardiovascular: Negative.  Negative for chest pain, palpitations and leg swelling.  Gastrointestinal:  Positive for abdominal pain. Negative for blood in stool, constipation, diarrhea, heartburn, melena, nausea and vomiting.  Genitourinary:  Negative for dysuria, flank pain, frequency, hematuria and urgency.       SEE HPI  Musculoskeletal: Negative.  Negative for myalgias.  Neurological:  Positive for headaches. Negative for dizziness, focal weakness and seizures.  Psychiatric/Behavioral: Negative.  Negative for suicidal ideas.     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   Chronic kidney disease    Depression    GERD (gastroesophageal  reflux disease)    Headache    otc med prn   History of kidney stones    HLD (hyperlipidemia)    diet controlled   Molar pregnancy    Pneumonia    x 1 yrs ago per patient 11/22/19   Renal calculus or stone    had stent/ lithotripsy in past   Sleep apnea    does not use cpap   Substance abuse (HCC)    Suicide attempt  (HCC)    x 2    Past Surgical History:  Procedure Laterality Date   CERVICAL DISC ARTHROPLASTY N/A 11/26/2019   Procedure: CERVICAL DISC ARTHROPLASTY C5-6 WITH PRO DISC;  Surgeon: Kerrin Champagne, MD;  Location: [facility];  Service: Orthopedics;  Laterality: N/A;   CYSTOSCOPY     x2 with stone extraction   EXTRACORPOREAL SHOCK WAVE LITHOTRIPSY Right 06/20/2013   Procedure: EXTRACORPOREAL SHOCK WAVE LITHOTRIPSY (ESWL);  Surgeon: Renee Ramus, MD;  Location: [facility];  Service: Urology;  Laterality: Right;   FOOT SURGERY Left 2003   LIPOMA EXCISION Right 11/19/2020   Procedure: EXCISION LIPOMA;  Surgeon: Lucretia Roers, MD;  Location: [facility];  Service: General;  Laterality: Right;   LITHOTRIPSY     TUBAL LIGATION  2017   TUBAL LIGATION     UMBILICAL HERNIA REPAIR N/A 03/28/2020   Procedure: HERNIA REPAIR UMBILICAL ADULT;  Surgeon: Lucretia Roers, MD;  Location: [facility];  Service: General;  Laterality: N/A;    Family History  Problem Relation Age of Onset   Depression Mother    Asthma Mother    Hypertension Mother    Mood Disorder Mother    Heart disease Father    Kidney disease Father    Alcoholism Father    Breast cancer Paternal Aunt    Breast cancer Maternal Grandmother    Breast cancer Cousin    Alcoholism Brother    Drug abuse Brother     Social History Reviewed with no changes to be made today.   Outpatient Medications Prior to Visit  Medication Sig Dispense Refill   albuterol (VENTOLIN HFA) 108 (90 Base) MCG/ACT inhaler Inhale 2 puffs into the lungs every 6 (six) hours as needed for wheezing or shortness of breath. 18 g 1   EPINEPHrine 0.3 mg/0.3 mL IJ SOAJ injection INJECT 0.3 MG INTO THE MUSCLE AS NEEDED FOR ANAPHYLAXIS 2 each 0   gabapentin (NEURONTIN) 300 MG capsule TAKE ONE CAPSULE BY MOUTH AT BEDTIME 30 capsule 1   ibuprofen (ADVIL) 800 MG tablet Take 1 tablet (800 mg total) by mouth every 8 (eight) hours as needed. 30 tablet 0   lamoTRIgine (LAMICTAL) 150 MG  tablet Take 1 tablet (150 mg total) by mouth 2 (two) times daily. 60 tablet 6   permethrin (ELIMITE) 5 % cream Prior to application, wash hair with conditioner-free shampoo; rinse with water and towel dry. Apply a sufficient amount of lotion or cream rinse to saturate the hair and scalp. Leave on hair for 10 minutes ONLY, then rinse off with warm water; remove remaining nits with nit comb. A single application is generally sufficient; however, may repeat 7 days after first treatment if lice or nits are still present. 60 g 0   traZODone (DESYREL) 100 MG tablet TAKE 2 TABLETS BY MOUTH AT BEDTIME 90 tablet 1   Facility-Administered Medications Prior to Visit  Medication Dose Route Frequency Provider Last Rate Last Admin   bupivacaine liposome (EXPAREL) 1.3 % injection    PRN  Lucretia Roers, MD   20 mL at 03/28/20 1233    Allergies  Allergen Reactions   5-Alpha Reductase Inhibitors     Per pt "outbursts"   Chloramphenicols    Levofloxacin     Unknown reaction   Penicillins Hives    No anaphylaxis  Did it involve swelling of the face/tongue/throat, SOB, or low BP? No Did it involve sudden or severe rash/hives, skin peeling, or any reaction on the inside of your mouth or nose? Yes Did you need to seek medical attention at a hospital or doctor's office? Yes When did it last happen?      Childhood If all above answers are "NO", may proceed with cephalosporin use.    Phenergan [Promethazine Hcl]     Tremors, restless leg, akathesia 01-26-2018   Seroquel [Quetiapine]     Leg tremors        Objective:    BP 113/79   Pulse 83   Ht 5\' 1"  (1.549 m)   Wt 203 lb 9.6 oz (92.4 kg)   SpO2 98%   BMI 38.47 kg/m  Wt Readings from Last 3 Encounters:  11/12/22 203 lb 9.6 oz (92.4 kg)  06/17/22 198 lb 12.8 oz (90.2 kg)  05/01/21 200 lb (90.7 kg)    Physical Exam Vitals and nursing note reviewed.  Constitutional:      Appearance: She is well-developed.  HENT:     Head: Normocephalic and  atraumatic.  Cardiovascular:     Rate and Rhythm: Normal rate and regular rhythm.     Heart sounds: Normal heart sounds. No murmur heard.    No friction rub. No gallop.  Pulmonary:     Effort: Pulmonary effort is normal. No tachypnea or respiratory distress.     Breath sounds: Normal breath sounds. No decreased breath sounds, wheezing, rhonchi or rales.  Chest:     Chest wall: No tenderness.  Abdominal:     General: Bowel sounds are normal.     Palpations: Abdomen is soft.  Musculoskeletal:        General: Normal range of motion.     Cervical back: Normal range of motion.  Skin:    General: Skin is warm and dry.  Neurological:     Mental Status: She is alert and oriented to person, place, and time.     Coordination: Coordination normal.  Psychiatric:        Behavior: Behavior normal. Behavior is cooperative.        Thought Content: Thought content normal.        Judgment: Judgment normal.          Patient has been counseled extensively about nutrition and exercise as well as the importance of adherence with medications and regular follow-up. The patient was given clear instructions to go to ER or return to medical center if symptoms don't improve, worsen or new problems develop. The patient verbalized understanding.   Follow-up: Return if symptoms worsen or fail to improve.   Claiborne Rigg, FNP-BC [facility] [place], [place] [phone]   11/12/2022, 8:55 PM

## 2022-11-13 LAB — CBC WITH DIFFERENTIAL/PLATELET
Basophils Absolute: 0.1 10*3/uL (ref 0.0–0.2)
Basos: 1 %
EOS (ABSOLUTE): 0.1 10*3/uL (ref 0.0–0.4)
Eos: 1 %
Hematocrit: 40.9 % (ref 34.0–46.6)
Hemoglobin: 13.2 g/dL (ref 11.1–15.9)
Immature Grans (Abs): 0 10*3/uL (ref 0.0–0.1)
Immature Granulocytes: 0 %
Lymphocytes Absolute: 1.6 10*3/uL (ref 0.7–3.1)
Lymphs: 18 %
MCH: 29.9 pg (ref 26.6–33.0)
MCHC: 32.3 g/dL (ref 31.5–35.7)
MCV: 93 fL (ref 79–97)
Monocytes Absolute: 0.9 10*3/uL (ref 0.1–0.9)
Monocytes: 10 %
Neutrophils Absolute: 6.5 10*3/uL (ref 1.4–7.0)
Neutrophils: 70 %
Platelets: 145 10*3/uL — ABNORMAL LOW (ref 150–450)
RBC: 4.41 x10E6/uL (ref 3.77–5.28)
RDW: 12.8 % (ref 11.7–15.4)
WBC: 9.2 10*3/uL (ref 3.4–10.8)

## 2022-11-13 LAB — CMP14+EGFR
ALT: 10 [IU]/L (ref 0–32)
AST: 12 [IU]/L (ref 0–40)
Albumin: 4.4 g/dL (ref 4.0–5.0)
Alkaline Phosphatase: 73 [IU]/L (ref 44–121)
BUN/Creatinine Ratio: 16 (ref 9–23)
BUN: 12 mg/dL (ref 6–20)
Bilirubin Total: <0.2 mg/dL (ref 0.0–1.2)
CO2: 23 mmol/L (ref 20–29)
Calcium: 9.5 mg/dL (ref 8.7–10.2)
Chloride: 103 mmol/L (ref 96–106)
Creatinine, Ser: 0.74 mg/dL (ref 0.57–1.00)
Globulin, Total: 2.5 g/dL (ref 1.5–4.5)
Glucose: 93 mg/dL (ref 70–99)
Potassium: 4 mmol/L (ref 3.5–5.2)
Sodium: 141 mmol/L (ref 134–144)
Total Protein: 6.9 g/dL (ref 6.0–8.5)
eGFR: 112 mL/min/{1.73_m2} (ref >59)

## 2022-11-17 ENCOUNTER — Ambulatory Visit: Payer: Medicaid Other | Admitting: Nurse Practitioner

## 2022-11-17 ENCOUNTER — Encounter: Payer: Self-pay | Admitting: Nurse Practitioner

## 2022-11-24 ENCOUNTER — Other Ambulatory Visit: Payer: Self-pay | Admitting: Nurse Practitioner

## 2022-11-24 DIAGNOSIS — S0990XD Unspecified injury of head, subsequent encounter: Secondary | ICD-10-CM

## 2022-11-24 DIAGNOSIS — R519 Headache, unspecified: Secondary | ICD-10-CM

## 2022-11-24 NOTE — Telephone Encounter (Signed)
Requested Prescriptions  Pending Prescriptions Disp Refills   gabapentin (NEURONTIN) 300 MG capsule [Pharmacy Med Name: gabapentin 300 mg capsule] 30 capsule 1    Sig: TAKE ONE CAPSULE BY MOUTH AT BEDTIME     Neurology: Anticonvulsants - gabapentin Passed - 11/24/2022  8:02 AM      Passed - Cr in normal range and within 360 days    Creatinine, Ser  Date Value Ref Range Status  11/12/2022 0.74 0.57 - 1.00 mg/dL Final         Passed - Completed PHQ-2 or PHQ-9 in the last 360 days      Passed - Valid encounter within last 12 months    Recent Outpatient Visits           1 week ago Foul smelling urine   [facility] [place], Shea Stakes, NP   5 months ago Occipital pain   [facility] Hoy Register, MD   1 year ago Anxiety and depression   [facility] [place], Shea Stakes, NP   1 year ago Stress incontinence   [facility] [place], Shea Stakes, NP   2 years ago Anxiety and depression   [facility] [place], Shea Stakes, NP

## 2022-11-30 ENCOUNTER — Encounter: Payer: Self-pay | Admitting: Nurse Practitioner

## 2022-12-01 ENCOUNTER — Other Ambulatory Visit: Payer: Self-pay | Admitting: Nurse Practitioner

## 2022-12-01 DIAGNOSIS — F1090 Alcohol use, unspecified, uncomplicated: Secondary | ICD-10-CM

## 2022-12-01 DIAGNOSIS — F3181 Bipolar II disorder: Secondary | ICD-10-CM

## 2022-12-01 DIAGNOSIS — F411 Generalized anxiety disorder: Secondary | ICD-10-CM

## 2023-03-08 DIAGNOSIS — Z5181 Encounter for therapeutic drug level monitoring: Secondary | ICD-10-CM | POA: Diagnosis not present

## 2023-03-08 DIAGNOSIS — F410 Panic disorder [episodic paroxysmal anxiety] without agoraphobia: Secondary | ICD-10-CM | POA: Diagnosis not present

## 2023-03-08 DIAGNOSIS — Z79899 Other long term (current) drug therapy: Secondary | ICD-10-CM | POA: Diagnosis not present

## 2023-03-08 DIAGNOSIS — F3132 Bipolar disorder, current episode depressed, moderate: Secondary | ICD-10-CM | POA: Diagnosis not present

## 2023-03-08 DIAGNOSIS — F900 Attention-deficit hyperactivity disorder, predominantly inattentive type: Secondary | ICD-10-CM | POA: Diagnosis not present

## 2023-03-08 DIAGNOSIS — F411 Generalized anxiety disorder: Secondary | ICD-10-CM | POA: Diagnosis not present

## 2023-03-28 ENCOUNTER — Ambulatory Visit: Payer: Medicaid Other | Attending: Family Medicine | Admitting: Nurse Practitioner

## 2023-03-28 VITALS — BP 119/75 | HR 75 | Resp 20 | Ht 61.0 in | Wt 186.4 lb

## 2023-03-28 DIAGNOSIS — F32A Depression, unspecified: Secondary | ICD-10-CM

## 2023-03-28 DIAGNOSIS — Z23 Encounter for immunization: Secondary | ICD-10-CM

## 2023-03-28 DIAGNOSIS — F419 Anxiety disorder, unspecified: Secondary | ICD-10-CM

## 2023-03-28 DIAGNOSIS — R7989 Other specified abnormal findings of blood chemistry: Secondary | ICD-10-CM | POA: Diagnosis not present

## 2023-03-28 DIAGNOSIS — R8290 Unspecified abnormal findings in urine: Secondary | ICD-10-CM

## 2023-03-28 DIAGNOSIS — R519 Headache, unspecified: Secondary | ICD-10-CM

## 2023-03-28 DIAGNOSIS — D696 Thrombocytopenia, unspecified: Secondary | ICD-10-CM

## 2023-03-28 DIAGNOSIS — H43391 Other vitreous opacities, right eye: Secondary | ICD-10-CM | POA: Diagnosis not present

## 2023-03-28 MED ORDER — HYDROXYZINE HCL 10 MG PO TABS: 10.0000 mg | ORAL_TABLET | Freq: Three times a day (TID) | ORAL | 0 refills | Status: DC | PRN
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
## 2023-03-28 NOTE — Patient Instructions (Addendum)
Placed in [facility]   [address]  [place], [place] [address] Ph# [phone]

## 2023-03-29 DIAGNOSIS — R0602 Shortness of breath: Secondary | ICD-10-CM | POA: Diagnosis not present

## 2023-03-29 DIAGNOSIS — K219 Gastro-esophageal reflux disease without esophagitis: Secondary | ICD-10-CM | POA: Diagnosis not present

## 2023-03-29 DIAGNOSIS — Z880 Allergy status to penicillin: Secondary | ICD-10-CM | POA: Diagnosis not present

## 2023-03-29 DIAGNOSIS — F17210 Nicotine dependence, cigarettes, uncomplicated: Secondary | ICD-10-CM | POA: Diagnosis not present

## 2023-03-29 DIAGNOSIS — R079 Chest pain, unspecified: Secondary | ICD-10-CM | POA: Diagnosis not present

## 2023-03-29 DIAGNOSIS — I959 Hypotension, unspecified: Secondary | ICD-10-CM | POA: Diagnosis not present

## 2023-03-29 DIAGNOSIS — N3001 Acute cystitis with hematuria: Secondary | ICD-10-CM | POA: Diagnosis not present

## 2023-03-29 LAB — URINALYSIS, COMPLETE
Glucose, UA: NEGATIVE
Ketones, UA: NEGATIVE
Nitrite, UA: POSITIVE — AB
Specific Gravity, UA: 1.023 (ref 1.005–1.030)
Urobilinogen, Ur: 1 mg/dL (ref 0.2–1.0)
pH, UA: 5.5 (ref 5.0–7.5)

## 2023-03-29 LAB — CBC WITH DIFFERENTIAL/PLATELET
Basophils Absolute: 0 10*3/uL (ref 0.0–0.2)
Basos: 1 %
EOS (ABSOLUTE): 0.1 10*3/uL (ref 0.0–0.4)
Eos: 1 %
Hematocrit: 41.2 % (ref 34.0–46.6)
Hemoglobin: 13.4 g/dL (ref 11.1–15.9)
Immature Grans (Abs): 0 10*3/uL (ref 0.0–0.1)
Immature Granulocytes: 0 %
Lymphocytes Absolute: 1.2 10*3/uL (ref 0.7–3.1)
Lymphs: 16 %
MCH: 30.2 pg (ref 26.6–33.0)
MCHC: 32.5 g/dL (ref 31.5–35.7)
MCV: 93 fL (ref 79–97)
Monocytes Absolute: 0.6 10*3/uL (ref 0.1–0.9)
Monocytes: 8 %
Neutrophils Absolute: 5.5 10*3/uL (ref 1.4–7.0)
Neutrophils: 74 %
Platelets: 178 10*3/uL (ref 150–450)
RBC: 4.44 x10E6/uL (ref 3.77–5.28)
RDW: 12.7 % (ref 11.7–15.4)
WBC: 7.4 10*3/uL (ref 3.4–10.8)

## 2023-03-29 LAB — MICROSCOPIC EXAMINATION
Bacteria, UA: NONE SEEN
Casts: NONE SEEN /[LPF]
WBC, UA: NONE SEEN /[HPF] (ref 0–5)

## 2023-03-29 LAB — CMP14+EGFR
ALT: 9 [IU]/L (ref 0–32)
AST: 12 [IU]/L (ref 0–40)
Albumin: 4.3 g/dL (ref 4.0–5.0)
Alkaline Phosphatase: 80 [IU]/L (ref 44–121)
BUN/Creatinine Ratio: 14 (ref 9–23)
BUN: 12 mg/dL (ref 6–20)
Bilirubin Total: 0.6 mg/dL (ref 0.0–1.2)
CO2: 23 mmol/L (ref 20–29)
Calcium: 9.1 mg/dL (ref 8.7–10.2)
Chloride: 107 mmol/L — ABNORMAL HIGH (ref 96–106)
Creatinine, Ser: 0.86 mg/dL (ref 0.57–1.00)
Globulin, Total: 2.5 g/dL (ref 1.5–4.5)
Glucose: 87 mg/dL (ref 70–99)
Potassium: 4.5 mmol/L (ref 3.5–5.2)
Sodium: 144 mmol/L (ref 134–144)
Total Protein: 6.8 g/dL (ref 6.0–8.5)
eGFR: 94 mL/min/{1.73_m2} (ref >59)

## 2023-03-31 ENCOUNTER — Encounter: Payer: Self-pay | Admitting: Nurse Practitioner

## 2023-03-31 ENCOUNTER — Other Ambulatory Visit: Payer: Self-pay | Admitting: Nurse Practitioner

## 2023-03-31 DIAGNOSIS — N3001 Acute cystitis with hematuria: Secondary | ICD-10-CM

## 2023-03-31 MED ORDER — NITROFURANTOIN MONOHYD MACRO 100 MG PO CAPS: 100.0000 mg | ORAL_CAPSULE | Freq: Two times a day (BID) | ORAL | 0 refills | Status: AC

## 2023-04-01 ENCOUNTER — Encounter: Payer: Self-pay | Admitting: Neurology

## 2023-04-05 ENCOUNTER — Other Ambulatory Visit: Payer: Self-pay | Admitting: Nurse Practitioner

## 2023-04-05 ENCOUNTER — Encounter: Payer: Self-pay | Admitting: Nurse Practitioner

## 2023-04-05 DIAGNOSIS — F3132 Bipolar disorder, current episode depressed, moderate: Secondary | ICD-10-CM | POA: Diagnosis not present

## 2023-04-05 DIAGNOSIS — F410 Panic disorder [episodic paroxysmal anxiety] without agoraphobia: Secondary | ICD-10-CM | POA: Diagnosis not present

## 2023-04-05 DIAGNOSIS — F900 Attention-deficit hyperactivity disorder, predominantly inattentive type: Secondary | ICD-10-CM | POA: Diagnosis not present

## 2023-04-05 DIAGNOSIS — F411 Generalized anxiety disorder: Secondary | ICD-10-CM | POA: Diagnosis not present

## 2023-04-05 NOTE — Telephone Encounter (Signed)
 Last Fill: 03/08/23  Last OV: 03/28/23 Next OV: 05/27/23  Routing to provider for review/authorization.

## 2023-04-05 NOTE — Telephone Encounter (Signed)
 Copied from CRM [id number]. Topic: Clinical - Medication Refill >> Apr 05, 2023  9:40 AM Thomes Dinning wrote: Most Recent Primary Care Visit:  Provider: Bertram Denver W  Department: [facility]  Visit Type: OFFICE VISIT  Date: 03/28/2023  Medication: buPROPion (WELLBUTRIN XL) 150 MG 24 hr tablet  Has the patient contacted their pharmacy? Yes (Agent: If no, request that the patient contact the pharmacy for the refill. If patient does not wish to contact the pharmacy document the reason why and proceed with request.) (Agent: If yes, when and what did the pharmacy advise?) Advised the medication wasn't sent to the pharmacy  Is this the correct pharmacy for this prescription? Yes If no, delete pharmacy and type the correct one.  This is the patient's preferred pharmacy:  [facility] - [place], [place] - [address] [place] [address] Phone: [phone] Fax: [phone]   Has the prescription been filled recently? No  Is the patient out of the medication? Yes  Has the patient been seen for an appointment in the last year OR does the patient have an upcoming appointment? Yes  Can we respond through MyChart? Yes  Agent: Please be advised that Rx refills may take up to 3 business days. We ask that you follow-up with your pharmacy.

## 2023-04-06 ENCOUNTER — Other Ambulatory Visit: Payer: Self-pay | Admitting: Nurse Practitioner

## 2023-04-06 DIAGNOSIS — R0789 Other chest pain: Secondary | ICD-10-CM

## 2023-05-03 DIAGNOSIS — Z79899 Other long term (current) drug therapy: Secondary | ICD-10-CM | POA: Diagnosis not present

## 2023-05-03 DIAGNOSIS — F900 Attention-deficit hyperactivity disorder, predominantly inattentive type: Secondary | ICD-10-CM | POA: Diagnosis not present

## 2023-05-03 DIAGNOSIS — F410 Panic disorder [episodic paroxysmal anxiety] without agoraphobia: Secondary | ICD-10-CM | POA: Diagnosis not present

## 2023-05-03 DIAGNOSIS — F411 Generalized anxiety disorder: Secondary | ICD-10-CM | POA: Diagnosis not present

## 2023-05-03 DIAGNOSIS — F3132 Bipolar disorder, current episode depressed, moderate: Secondary | ICD-10-CM | POA: Diagnosis not present

## 2023-05-03 DIAGNOSIS — Z5181 Encounter for therapeutic drug level monitoring: Secondary | ICD-10-CM | POA: Diagnosis not present
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
## 2023-05-20 ENCOUNTER — Ambulatory Visit: Admitting: Neurology

## 2023-05-27 ENCOUNTER — Ambulatory Visit: Payer: Medicaid Other | Admitting: Nurse Practitioner

## 2023-05-31 DIAGNOSIS — F410 Panic disorder [episodic paroxysmal anxiety] without agoraphobia: Secondary | ICD-10-CM | POA: Diagnosis not present

## 2023-05-31 DIAGNOSIS — F411 Generalized anxiety disorder: Secondary | ICD-10-CM | POA: Diagnosis not present

## 2023-05-31 DIAGNOSIS — F900 Attention-deficit hyperactivity disorder, predominantly inattentive type: Secondary | ICD-10-CM | POA: Diagnosis not present

## 2023-05-31 DIAGNOSIS — Z79899 Other long term (current) drug therapy: Secondary | ICD-10-CM | POA: Diagnosis not present

## 2023-05-31 DIAGNOSIS — Z5181 Encounter for therapeutic drug level monitoring: Secondary | ICD-10-CM | POA: Diagnosis not present

## 2023-05-31 DIAGNOSIS — F3132 Bipolar disorder, current episode depressed, moderate: Secondary | ICD-10-CM | POA: Diagnosis not present

## 2023-06-03 ENCOUNTER — Other Ambulatory Visit (HOSPITAL_COMMUNITY)
Admission: RE | Admit: 2023-06-03 | Discharge: 2023-06-03 | Disposition: A | Discharge status: Home / Self Care | Source: Ambulatory Visit | Attending: Nurse Practitioner | Admitting: Nurse Practitioner

## 2023-06-03 ENCOUNTER — Ambulatory Visit: Attending: Nurse Practitioner | Admitting: Nurse Practitioner

## 2023-06-03 ENCOUNTER — Encounter: Payer: Self-pay | Admitting: Nurse Practitioner

## 2023-06-03 VITALS — BP 113/75 | HR 85 | Resp 19 | Ht 61.0 in | Wt 191.6 lb

## 2023-06-03 DIAGNOSIS — Z124 Encounter for screening for malignant neoplasm of cervix: Secondary | ICD-10-CM | POA: Diagnosis present

## 2023-06-03 DIAGNOSIS — Z113 Encounter for screening for infections with a predominantly sexual mode of transmission: Secondary | ICD-10-CM

## 2023-06-03 DIAGNOSIS — R399 Unspecified symptoms and signs involving the genitourinary system: Secondary | ICD-10-CM

## 2023-06-03 NOTE — Progress Notes (Signed)
 Assessment & Plan:  Kellie Martinez was seen today for gynecologic exam.  Diagnoses and all orders for this visit:  Encounter for Papanicolaou smear for cervical cancer screening -     Cervicovaginal ancillary only -     Cytology - PAP  UTI symptoms -     Urinalysis, Complete  Screening for STD (sexually transmitted disease) -     HIV antibody (with reflex) -     RPR    Patient has been counseled on age-appropriate routine health concerns for screening and prevention. These are reviewed and up-to-date. Referrals have been placed accordingly. Immunizations are up-to-date or declined.    Subjective:   Chief Complaint  Patient presents with   Gynecologic Exam    Kellie Martinez 28 y.o. female presents to office today for well woman exam  She is requesting STD testing to include HIV, syphilis, gonorrhea, trichomonas, chlamydia and bacterial vaginosis   Review of Systems  Constitutional: Negative.  Negative for chills, fever, malaise/fatigue and weight loss.  Respiratory: Negative.  Negative for cough, shortness of breath and wheezing.   Cardiovascular: Negative.  Negative for chest pain, orthopnea and leg swelling.  Gastrointestinal:  Negative for abdominal pain.  Genitourinary:  Negative for flank pain.       Urine odor  Skin: Negative.  Negative for rash.  Psychiatric/Behavioral:  Negative for suicidal ideas.     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   Chronic kidney disease    Depression    GERD (gastroesophageal reflux disease)    Headache    otc med prn   History of kidney stones    HLD (hyperlipidemia)    diet controlled   Molar pregnancy    Pneumonia    x 1 yrs ago per patient 11/22/19   Renal calculus or stone    had stent/ lithotripsy in past   Sleep apnea    does not use cpap   Substance abuse (HCC)    Suicide attempt (HCC)    x 2    Past  Surgical History:  Procedure Laterality Date   CERVICAL DISC ARTHROPLASTY N/A 11/26/2019   Procedure: CERVICAL DISC ARTHROPLASTY C5-6 WITH PRO DISC;  Surgeon: Alphonso Jean, MD;  Location: [facility];  Service: Orthopedics;  Laterality: N/A;   CYSTOSCOPY     x2 with stone extraction   EXTRACORPOREAL SHOCK WAVE LITHOTRIPSY Right 06/20/2013   Procedure: EXTRACORPOREAL SHOCK WAVE LITHOTRIPSY (ESWL);  Surgeon: Dewanda Foots, MD;  Location: [facility];  Service: Urology;  Laterality: Right;   FOOT SURGERY Left 2003   LIPOMA EXCISION Right 11/19/2020   Procedure: EXCISION LIPOMA;  Surgeon: Awilda Bogus, MD;  Location: [facility];  Service: General;  Laterality: Right;   LITHOTRIPSY     TUBAL LIGATION  2017   TUBAL LIGATION     UMBILICAL HERNIA REPAIR N/A 03/28/2020   Procedure: HERNIA REPAIR UMBILICAL ADULT;  Surgeon: Awilda Bogus, MD;  Location: [facility];  Service: General;  Laterality: N/A;    Family History  Problem Relation Age of Onset   Depression Mother    Asthma Mother    Hypertension Mother    Mood Disorder Mother    Heart disease Father    Kidney disease Father    Alcoholism Father    Breast cancer Paternal Aunt  Breast cancer Maternal Grandmother    Breast cancer Cousin    Alcoholism Brother    Drug abuse Brother     Social History Reviewed with no changes to be made today.   Outpatient Medications Prior to Visit  Medication Sig Dispense Refill   buPROPion (WELLBUTRIN XL) 150 MG 24 hr tablet Take 150 mg by mouth every morning.     hydrOXYzine  (ATARAX ) 10 MG tablet Take 1 tablet (10 mg total) by mouth 3 (three) times daily as needed. 30 tablet 0   lamoTRIgine  (LAMICTAL ) 150 MG tablet Take 1 tablet (150 mg total) by mouth 2 (two) times daily. 60 tablet 6   traZODone  (DESYREL ) 100 MG tablet TAKE 2 TABLETS BY MOUTH AT BEDTIME 90 tablet 1   Facility-Administered Medications Prior to Visit  Medication Dose Route Frequency Provider Last Rate Last Admin   bupivacaine  liposome  (EXPAREL ) 1.3 % injection    PRN Awilda Bogus, MD   20 mL at 03/28/20 1233    Allergies  Allergen Reactions   5-Alpha Reductase Inhibitors     Per pt "outbursts"   Chloramphenicols    Levofloxacin     Unknown reaction   Penicillins Hives    No anaphylaxis  Did it involve swelling of the face/tongue/throat, SOB, or low BP? No Did it involve sudden or severe rash/hives, skin peeling, or any reaction on the inside of your mouth or nose? Yes Did you need to seek medical attention at a hospital or doctor's office? Yes When did it last happen?      Childhood If all above answers are "NO", may proceed with cephalosporin use.    Phenergan [Promethazine Hcl]     Tremors, restless leg, akathesia 01-26-2018   Seroquel  [Quetiapine ]     Leg tremors        Objective:    BP 113/75 (BP Location: Left Arm, Patient Position: Sitting, Cuff Size: Normal)   Pulse 85   Resp 19   Ht 5\' 1"  (1.549 m)   Wt 191 lb 9.6 oz (86.9 kg)   LMP 04/11/2023   SpO2 100%   BMI 36.20 kg/m  Wt Readings from Last 3 Encounters:  06/03/23 191 lb 9.6 oz (86.9 kg)  03/28/23 186 lb 6.4 oz (84.6 kg)  11/12/22 203 lb 9.6 oz (92.4 kg)    Physical Exam Exam conducted with a chaperone present.  Constitutional:      Appearance: She is well-developed.  HENT:     Head: Normocephalic.  Cardiovascular:     Rate and Rhythm: Normal rate and regular rhythm.     Heart sounds: Normal heart sounds.  Pulmonary:     Effort: Pulmonary effort is normal.     Breath sounds: Normal breath sounds.  Abdominal:     General: Bowel sounds are normal.     Palpations: Abdomen is soft.     Hernia: There is no hernia in the left inguinal area.  Genitourinary:    Exam position: Lithotomy position.     Labia:        Right: No rash, tenderness, lesion or injury.        Left: No rash, tenderness, lesion or injury.      Vagina: Normal. No signs of injury and foreign body. No vaginal discharge, erythema, tenderness or bleeding.      Cervix: Normal.     Uterus: Not deviated and not enlarged.      Adnexa:        Right: No mass, tenderness  or fullness.         Left: No mass, tenderness or fullness.       Rectum: Normal. No external hemorrhoid.  Lymphadenopathy:     Lower Body: No right inguinal adenopathy. No left inguinal adenopathy.  Skin:    General: Skin is warm and dry.  Neurological:     Mental Status: She is alert and oriented to person, place, and time.  Psychiatric:        Behavior: Behavior normal.        Thought Content: Thought content normal.        Judgment: Judgment normal.          Patient has been counseled extensively about nutrition and exercise as well as the importance of adherence with medications and regular follow-up. The patient was given clear instructions to go to ER or return to medical center if symptoms don't improve, worsen or new problems develop. The patient verbalized understanding.   Follow-up: Return if symptoms worsen or fail to improve.   Collins Dean, FNP-BC [facility] [place], [place] [phone]   06/03/2023, 1:53 PM

## 2023-06-04 LAB — URINALYSIS, COMPLETE
Bilirubin, UA: NEGATIVE
Glucose, UA: NEGATIVE
Ketones, UA: NEGATIVE
Nitrite, UA: NEGATIVE
Protein,UA: NEGATIVE
Specific Gravity, UA: 1.02 (ref 1.005–1.030)
Urobilinogen, Ur: 0.2 mg/dL (ref 0.2–1.0)
pH, UA: 6.5 (ref 5.0–7.5)

## 2023-06-04 LAB — MICROSCOPIC EXAMINATION
Casts: NONE SEEN /LPF
Epithelial Cells (non renal): >10 /HPF — AB (ref 0–10)

## 2023-06-04 LAB — RPR: RPR Ser Ql: NONREACTIVE

## 2023-06-04 LAB — HIV ANTIBODY (ROUTINE TESTING W REFLEX): HIV Screen 4th Generation wRfx: NONREACTIVE

## 2023-06-06 ENCOUNTER — Other Ambulatory Visit: Payer: Self-pay | Admitting: Nurse Practitioner

## 2023-06-06 ENCOUNTER — Encounter: Payer: Self-pay | Admitting: Nurse Practitioner

## 2023-06-06 DIAGNOSIS — R399 Unspecified symptoms and signs involving the genitourinary system: Secondary | ICD-10-CM

## 2023-06-06 DIAGNOSIS — B9689 Other specified bacterial agents as the cause of diseases classified elsewhere: Secondary | ICD-10-CM

## 2023-06-06 LAB — CERVICOVAGINAL ANCILLARY ONLY
Bacterial Vaginitis (gardnerella): POSITIVE — AB
Candida Glabrata: NEGATIVE
Candida Vaginitis: NEGATIVE
Chlamydia: NEGATIVE
Comment: NEGATIVE
Comment: NEGATIVE
Comment: NEGATIVE
Comment: NEGATIVE
Comment: NEGATIVE
Comment: NORMAL
Neisseria Gonorrhea: NEGATIVE
Trichomonas: NEGATIVE

## 2023-06-06 MED ORDER — METRONIDAZOLE 500 MG PO TABS: 500.0000 mg | ORAL_TABLET | Freq: Two times a day (BID) | ORAL | 0 refills | Status: AC

## 2023-06-08 LAB — CYTOLOGY - PAP
Chlamydia: NEGATIVE
Comment: NEGATIVE
Comment: NEGATIVE
Comment: NORMAL
Diagnosis: NEGATIVE
High risk HPV: NEGATIVE
Neisseria Gonorrhea: NEGATIVE

## 2023-06-10 ENCOUNTER — Other Ambulatory Visit: Payer: Self-pay | Admitting: Nurse Practitioner

## 2023-06-10 ENCOUNTER — Encounter: Payer: Self-pay | Admitting: Nurse Practitioner

## 2023-06-10 DIAGNOSIS — R399 Unspecified symptoms and signs involving the genitourinary system: Secondary | ICD-10-CM

## 2023-06-11 NOTE — Progress Notes (Unsigned)
 Cardiology Office Note:   Date:  06/13/2023  ID:  Kellie Martinez, DOB Apr 10, 1995, MRN [id number] PCP:  Collins Dean, NP  [facility] Providers Cardiologist:  Alyssa Backbone, MD Referring MD: Collins Dean, NP  Chief Complaint/Reason for Referral:  Chest pain ASSESSMENT:    1. Precordial pain   2. Bipolar 2 disorder (HCC)   3. BMI 36.0-36.9,adult     PLAN:   In order of problems listed above: Chest pain:  We will obtain a coronary CTA and echocardiogram to evaluate further.  If the Kellie Martinez has mild obstructive coronary artery disease, they will require a statin (with goal LDL < 70) and aspirin, if they have high-grade disease we will need to consider optimal medical therapy and if symptoms are refractory to medical therapy, then a cardiac catheterization with possible PCI will be pursued to alleviate symptoms.  If they have high risk disease we will proceed directly to cardiac catheterization.  If CTA is reassuring and Kellie Martinez continues to have debilitating chest pain symptoms could consider PET stress test to evaluate for coronary microvascular dysfunction. Bipolar disorder: Continue Wellbutrin 150 mg daily, Lamictal  150 mg twice daily. Elevated BMI: Will check hemoglobin A1c to screen for diabetes.  If positive will alert PCP and refer to pharmacy for recommendations regarding GLP-1 receptor agonist therapy.              Dispo:  Return if symptoms worsen or fail to improve.      Medication Adjustments/Labs and Tests Ordered: Current medicines are reviewed at length with the Kellie Martinez today.  Concerns regarding medicines are outlined above.  The following changes have been made:  no change   Labs/tests ordered: Orders Placed This Encounter  Procedures   CT CORONARY MORPH W/CTA COR W/SCORE W/CA W/CM &/OR WO/CM   HgB A1c   Basic metabolic panel with GFR   EKG 95-AOZH   ECHOCARDIOGRAM COMPLETE    Medication Changes: Meds ordered this encounter  Medications    metoprolol tartrate (LOPRESSOR) 100 MG tablet    Sig: Take 1 tablet (100 mg total) by mouth once for 1 dose. Take 90-120 minutes prior to scan. Hold for SBP less than 110.    Dispense:  1 tablet    Refill:  0    Current medicines are reviewed at length with the Kellie Martinez today.  The Kellie Martinez does not have concerns regarding medicines.     History of Present Illness:      FOCUSED PROBLEM LIST:   Bipolar disorder Depression BMI 40 Family history CAD  May 2025:   Kellie Martinez consents to use of AI scribe. The Kellie Martinez is a 28 year old female developed with medical problems referred PCP for recommendations regarding chest pain.  She experiences sharp chest pain that sometimes radiates and causes numbness. The pain is described as a tightness in the chest that makes it difficult to breathe. It occurs both at rest and during exertion, but more frequently when sitting.  The chest discomfort occurs up to three times a day, lasting from 30 minutes to an hour, and sometimes persists for hours before resolving on its own. She has not identified any specific factors that alleviate the pain.  No smoking and infrequent alcohol consumption, stating she has 'slowed down a lot'. 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   lamoTRIgine  (LAMICTAL ) 200 MG tablet Take 200 mg by mouth 2 (two) times daily.   metoprolol tartrate (LOPRESSOR) 100 MG tablet Take 1 tablet (100 mg total) by mouth once for 1 dose. Take 90-120 minutes prior to scan. Hold for SBP less than 110.   Oxcarbazepine (TRILEPTAL) 300 MG tablet Take 300 mg by mouth 2 (two) times daily.     Review of Systems:   Please see the history of present illness.    All other systems reviewed and are negative.     EKGs/Labs/Other Test Reviewed:   EKG: 2022 normal sinus rhythm  EKG  Interpretation Date/Time:  Monday Jun 13 2023 14:14:54 EDT Ventricular Rate:  83 PR Interval:  148 QRS Duration:  76 QT Interval:  378 QTC Calculation: 444 R Axis:   62  Text Interpretation: Normal sinus rhythm Normal ECG When compared with ECG of 26-Mar-2020 14:48, No significant change was found Confirmed by Alyssa Backbone ([id number]) on 06/13/2023 2:18:18 PM         Risk Assessment/Calculations:          Physical Exam:   VS:  BP 116/76   Pulse 82   Ht 5\' 1"  (1.549 m)   Wt 188 lb (85.3 kg)   LMP 04/11/2023   SpO2 96%   BMI 35.52 kg/m        Wt Readings from Last 3 Encounters:  06/13/23 188 lb (85.3 kg)  06/03/23 191 lb 9.6 oz (86.9 kg)  03/28/23 186 lb 6.4 oz (84.6 kg)      GENERAL:  No apparent distress, AOx3 HEENT:  No carotid bruits, +2 carotid impulses, no scleral icterus CAR: RRR no murmurs, gallops, rubs, or thrills RES:  Clear to auscultation bilaterally ABD:  Soft, nontender, nondistended, positive bowel sounds x 4 VASC:  +2 radial pulses, +2 carotid pulses NEURO:  CN 2-12 grossly intact; motor and sensory grossly intact PSYCH:  No active depression or anxiety EXT:  No edema, ecchymosis, or cyanosis  Signed, Tearsa Kowalewski K Jermarion Poffenberger, MD  06/13/2023 2:58 PM    [facility] [address] [place], [place], [place]  [address] Phone: [phone]; Fax: [phone]   Note:  This document was prepared using Dragon voice recognition software and may include unintentional dictation errors.

## 2023-06-12 ENCOUNTER — Encounter: Payer: Self-pay | Admitting: Nurse Practitioner

## 2023-06-13 ENCOUNTER — Other Ambulatory Visit: Payer: Self-pay | Admitting: Nurse Practitioner

## 2023-06-13 ENCOUNTER — Encounter: Payer: Self-pay | Admitting: Internal Medicine

## 2023-06-13 ENCOUNTER — Ambulatory Visit: Attending: Internal Medicine | Admitting: Internal Medicine

## 2023-06-13 VITALS — BP 116/76 | HR 82 | Ht 61.0 in | Wt 188.0 lb

## 2023-06-13 DIAGNOSIS — F3181 Bipolar II disorder: Secondary | ICD-10-CM | POA: Insufficient documentation

## 2023-06-13 DIAGNOSIS — Z6836 Body mass index (BMI) 36.0-36.9, adult: Secondary | ICD-10-CM | POA: Diagnosis not present

## 2023-06-13 DIAGNOSIS — H8110 Benign paroxysmal vertigo, unspecified ear: Secondary | ICD-10-CM

## 2023-06-13 DIAGNOSIS — R072 Precordial pain: Secondary | ICD-10-CM | POA: Insufficient documentation

## 2023-06-13 MED ORDER — METOPROLOL TARTRATE 100 MG PO TABS: 100.0000 mg | ORAL_TABLET | Freq: Once | ORAL | 0 refills | Status: AC

## 2023-06-13 NOTE — Patient Instructions (Addendum)
 Medication Instructions:  No changes *If you need a refill on your cardiac medications before your next appointment, please call your pharmacy*   Lab Work: Today: go to the first floor lab (hgA1c) and BMET  If you have labs (blood work) drawn today and your tests are completely normal, you will receive your results only by: MyChart Message (if you have MyChart) OR A paper copy in the mail If you have any lab test that is abnormal or we need to change your treatment, we will call you to review the results.   Testing/Procedures: Your physician has requested that you have an echocardiogram. Echocardiography is a painless test that uses sound waves to create images of your heart. It provides your doctor with information about the size and shape of your heart and how well your heart's chambers and valves are working. This procedure takes approximately one hour. There are no restrictions for this procedure. Please do NOT wear cologne, perfume, aftershave, or lotions (deodorant is allowed). Please arrive 15 minutes prior to your appointment time.  Please note: We ask at that you not bring children with you during ultrasound (echo/ vascular) testing. Due to room size and safety concerns, children are not allowed in the ultrasound rooms during exams. Our front office staff cannot provide observation of children in our lobby area while testing is being conducted. An adult accompanying a patient to their appointment will only be allowed in the ultrasound room at the discretion of the ultrasound technician under special circumstances. We apologize for any inconvenience.     Your cardiac CT will be scheduled at one of the below locations:    [facility] Mom. [facility] [address]  [place], [place] [address] Opening June 06, 2023     If scheduled at the Heart and Vascular Tower at [employer], please enter the parking lot using the [address] street entrance and use the FREE  valet service at the patient drop-off area. Enter the buidling and check-in with registration on the main floor.   Please follow these instructions carefully (unless otherwise directed):  An IV will be required for this test and Nitroglycerin will be given.  Hold all erectile dysfunction medications at least 3 days (72 hrs) prior to test. (Ie viagra, cialis, sildenafil, tadalafil, etc)   On the Night Before the Test: Be sure to Drink plenty of water. Do not consume any caffeinated/decaffeinated beverages or chocolate 12 hours prior to your test. Do not take any antihistamines 12 hours prior to your test.   On the Day of the Test: Drink plenty of water until 1 hour prior to the test. Do not eat any food 1 hour prior to test. You may take your regular medications prior to the test.  Take metoprolol 100 mg by mouth (Lopressor) two hours prior to test.  Patients who wear a continuous glucose monitor MUST remove the device prior to scanning. FEMALES- please wear underwire-free bra if available, avoid dresses & tight clothing       After the Test: Drink plenty of water. After receiving IV contrast, you may experience a mild flushed feeling. This is normal. On occasion, you may experience a mild rash up to 24 hours after the test. This is not dangerous. If this occurs, you can take Benadryl  25 mg, Zyrtec , Claritin , or Allegra and increase your fluid intake. (Patients taking Tikosyn should avoid Benadryl , and may take Zyrtec , Claritin , or Allegra) If you experience trouble breathing, this can be serious. If  it is severe call 911 IMMEDIATELY. If it is mild, please call our office.  We will call to schedule your test 2-4 weeks out understanding that some insurance companies will need an authorization prior to the service being performed.   For more information and frequently asked questions, please visit our website : [email or web address]  For non-scheduling related questions,  please contact the cardiac imaging nurse navigator should you have any questions/concerns: Cardiac Imaging Nurse Navigators Direct Office Dial: [phone]   For scheduling needs, including cancellations and rescheduling, please call [place], [phone].   Follow-Up: As needed

## 2023-06-14 ENCOUNTER — Encounter: Payer: Self-pay | Admitting: Internal Medicine

## 2023-06-14 LAB — BASIC METABOLIC PANEL WITH GFR
BUN/Creatinine Ratio: 22 (ref 9–23)
BUN: 14 mg/dL (ref 6–20)
CO2: 21 mmol/L (ref 20–29)
Calcium: 9.3 mg/dL (ref 8.7–10.2)
Chloride: 103 mmol/L (ref 96–106)
Creatinine, Ser: 0.65 mg/dL (ref 0.57–1.00)
Glucose: 82 mg/dL (ref 70–99)
Potassium: 3.9 mmol/L (ref 3.5–5.2)
Sodium: 140 mmol/L (ref 134–144)
eGFR: 122 mL/min/{1.73_m2} (ref >59)

## 2023-06-14 LAB — HEMOGLOBIN A1C
Est. average glucose Bld gHb Est-mCnc: 97 mg/dL
Hgb A1c MFr Bld: 5 % (ref 4.8–5.6)

## 2023-06-27 ENCOUNTER — Ambulatory Visit: Attending: Nurse Practitioner

## 2023-06-28 DIAGNOSIS — F3132 Bipolar disorder, current episode depressed, moderate: Secondary | ICD-10-CM | POA: Diagnosis not present

## 2023-06-28 DIAGNOSIS — Z79899 Other long term (current) drug therapy: Secondary | ICD-10-CM | POA: Diagnosis not present

## 2023-06-28 DIAGNOSIS — Z5181 Encounter for therapeutic drug level monitoring: Secondary | ICD-10-CM | POA: Diagnosis not present

## 2023-06-28 DIAGNOSIS — F410 Panic disorder [episodic paroxysmal anxiety] without agoraphobia: Secondary | ICD-10-CM | POA: Diagnosis not present

## 2023-06-28 DIAGNOSIS — F411 Generalized anxiety disorder: Secondary | ICD-10-CM | POA: Diagnosis not present

## 2023-06-28 DIAGNOSIS — F900 Attention-deficit hyperactivity disorder, predominantly inattentive type: Secondary | ICD-10-CM | POA: Diagnosis not present

## 2023-06-30 ENCOUNTER — Encounter (HOSPITAL_COMMUNITY): Payer: Self-pay

## 2023-07-01 ENCOUNTER — Telehealth (HOSPITAL_COMMUNITY): Payer: Self-pay | Admitting: Emergency Medicine

## 2023-07-01 NOTE — Telephone Encounter (Signed)
 Reaching out to patient to offer assistance regarding upcoming cardiac imaging study; pt verbalizes understanding of appt date/time, parking situation and where to check in, pre-test NPO status and medications ordered, and verified current allergies; name and call back number provided for further questions should they arise Kellie Alexandria RN Navigator Cardiac Imaging [facility] [phone] office [phone] cell

## 2023-07-05 ENCOUNTER — Ambulatory Visit: Payer: Self-pay | Admitting: Internal Medicine

## 2023-07-05 ENCOUNTER — Ambulatory Visit (HOSPITAL_COMMUNITY)
Admission: RE | Admit: 2023-07-05 | Discharge: 2023-07-05 | Disposition: A | Discharge status: Home / Self Care | Source: Ambulatory Visit | Attending: Internal Medicine | Admitting: Internal Medicine

## 2023-07-05 DIAGNOSIS — Z6836 Body mass index (BMI) 36.0-36.9, adult: Secondary | ICD-10-CM | POA: Diagnosis present

## 2023-07-05 DIAGNOSIS — F3181 Bipolar II disorder: Secondary | ICD-10-CM | POA: Diagnosis present

## 2023-07-05 DIAGNOSIS — R072 Precordial pain: Secondary | ICD-10-CM | POA: Insufficient documentation

## 2023-07-05 MED ORDER — NITROGLYCERIN 0.4 MG SL SUBL
0.8000 mg | SUBLINGUAL_TABLET | Freq: Once | SUBLINGUAL | Status: AC
  Administered 2023-07-05: 0.8 mg via SUBLINGUAL

## 2023-07-05 MED ORDER — IOHEXOL 350 MG/ML SOLN
100.0000 mL | Freq: Once | INTRAVENOUS | Status: AC | PRN
  Administered 2023-07-05: 100 mL via INTRAVENOUS

## 2023-07-05 NOTE — Progress Notes (Signed)
 Discussed with Dr. Chancy Comber. Ok to proceed with study and may need to give 500 ml fluid bolus post study.

## 2023-07-11 ENCOUNTER — Ambulatory Visit (HOSPITAL_COMMUNITY)
Admission: RE | Admit: 2023-07-11 | Discharge: 2023-07-11 | Disposition: A | Discharge status: Home / Self Care | Source: Ambulatory Visit | Attending: Cardiovascular Disease | Admitting: Cardiovascular Disease

## 2023-07-11 DIAGNOSIS — R072 Precordial pain: Secondary | ICD-10-CM

## 2023-07-11 LAB — ECHOCARDIOGRAM COMPLETE
Area-P 1/2: 4.8 cm2
S' Lateral: 3 cm

## 2023-07-21 ENCOUNTER — Ambulatory Visit

## 2023-07-26 ENCOUNTER — Ambulatory Visit: Attending: Nurse Practitioner | Admitting: Physical Therapy

## 2023-07-26 NOTE — Therapy (Incomplete)
 OUTPATIENT PHYSICAL THERAPY VESTIBULAR EVALUATION     Patient Name: Kellie Martinez MRN: [id number] DOB:03/21/1995, 28 y.o., female Today's Date: 07/26/2023  END OF SESSION:   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   Chronic kidney disease    Depression    GERD (gastroesophageal reflux disease)    Headache    otc med prn   History of kidney stones    HLD (hyperlipidemia)    diet controlled   Molar pregnancy    Pneumonia    x 1 yrs ago per patient 11/22/19   Renal calculus or stone    had stent/ lithotripsy in past   Sleep apnea    does not use cpap   Substance abuse (HCC)    Suicide attempt (HCC)    x 2   Past Surgical History:  Procedure Laterality Date   CERVICAL DISC ARTHROPLASTY N/A 11/26/2019   Procedure: CERVICAL DISC ARTHROPLASTY C5-6 WITH PRO DISC;  Surgeon: Alphonso Jean, MD;  Location: [facility];  Service: Orthopedics;  Laterality: N/A;   CYSTOSCOPY     x2 with stone extraction   EXTRACORPOREAL SHOCK WAVE LITHOTRIPSY Right 06/20/2013   Procedure: EXTRACORPOREAL SHOCK WAVE LITHOTRIPSY (ESWL);  Surgeon: Dewanda Foots, MD;  Location: [facility];  Service: Urology;  Laterality: Right;   FOOT SURGERY Left 2003   LIPOMA EXCISION Right 11/19/2020   Procedure: EXCISION LIPOMA;  Surgeon: Awilda Bogus, MD;  Location: [facility];  Service: General;  Laterality: Right;   LITHOTRIPSY     TUBAL LIGATION  2017   TUBAL LIGATION     UMBILICAL HERNIA REPAIR N/A 03/28/2020   Procedure: HERNIA REPAIR UMBILICAL ADULT;  Surgeon: Awilda Bogus, MD;  Location: [facility];  Service: General;  Laterality: N/A;   Patient Active Problem List   Diagnosis Date Noted   Cervical myelopathy (HCC) 07/28/2021   Lipoma of torso 11/04/2020   Herniation of cervical intervertebral disc with radiculopathy 11/26/2019    Class: Chronic   Cervical disc disorder at C5-C6 level with  radiculopathy 11/26/2019   GAD (generalized anxiety disorder) 01/03/2019   Attention deficit hyperactivity disorder (ADHD), combined type 01/03/2019   Bipolar 2 disorder (HCC) 01/03/2019   Precordial pain 07/07/2017   History of renal stone 05/18/2017   Umbilical hernia without obstruction and without gangrene 05/18/2017   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   Cystic kidney disease 05/06/2017   Difficulty sleeping 05/06/2017   Gastroesophageal reflux disease without esophagitis 05/06/2017   History of drug overdose 05/06/2017    PCP: Collins Dean, NP  REFERRING PROVIDER: Collins Dean, NP   REFERRING DIAG: H81.10 (ICD-10-CM) - Benign paroxysmal positional vertigo, unspecified laterality   THERAPY DIAG:  No diagnosis found.  ONSET DATE: ***  Rationale for Evaluation and Treatment: {HABREHAB:27488}  SUBJECTIVE:   SUBJECTIVE STATEMENT: *** Pt accompanied by: {accompnied:27141}  PERTINENT HISTORY: ***  PAIN:  Are you having pain? {OPRCPAIN:27236}  PRECAUTIONS: {Therapy precautions:24002}  RED FLAGS: {PT Red Flags:29287}   WEIGHT BEARING RESTRICTIONS: {Yes ***/No:24003}  FALLS: Has patient fallen in last 6 months? {fallsyesno:27318}  LIVING ENVIRONMENT: Lives with: {OPRC lives with:25569::lives with their family} Lives in: {Lives in:25570} Stairs: {opstairs:27293} Has following equipment at home: {Assistive devices:23999}  PLOF: {PLOF:24004}  PATIENT GOALS: ***  OBJECTIVE:  Note: Objective measures were completed at Evaluation unless otherwise noted.  DIAGNOSTIC FINDINGS: ***  COGNITION: Overall cognitive status: {cognition:24006}   SENSATION: {sensation:27233}  EDEMA:  {edema:24020}  MUSCLE TONE:  {LE tone:25568}  DTRs:  {DTR SITE:24025}  POSTURE:  {posture:25561}  Cervical ROM:    {AROM/PROM:27142} A/PROM (deg) eval  Flexion   Extension   Right lateral flexion   Left lateral flexion   Right rotation   Left rotation   (Blank rows =  not tested)  STRENGTH: ***  LOWER EXTREMITY MMT:   MMT Right eval Left eval  Hip flexion    Hip abduction    Hip adduction    Hip internal rotation    Hip external rotation    Knee flexion    Knee extension    Ankle dorsiflexion    Ankle plantarflexion    Ankle inversion    Ankle eversion    (Blank rows = not tested)  BED MOBILITY:  {Bed mobility:24027}  TRANSFERS: Assistive device utilized: {Assistive devices:23999}  Sit to stand: {Levels of assistance:24026} Stand to sit: {Levels of assistance:24026} Chair to chair: {Levels of assistance:24026} Floor: {Levels of assistance:24026}  RAMP: {Levels of assistance:24026}  CURB: {Levels of assistance:24026}  GAIT: Gait pattern: {gait characteristics:25376} Distance walked: *** Assistive device utilized: {Assistive devices:23999} Level of assistance: {Levels of assistance:24026} Comments: ***  FUNCTIONAL TESTS:  {Functional tests:24029}  PATIENT SURVEYS:  {rehab surveys:24030}  VESTIBULAR ASSESSMENT:  GENERAL OBSERVATION: ***   SYMPTOM BEHAVIOR:  Subjective history: ***  Non-Vestibular symptoms: {nonvestibular symptoms:25260}  Type of dizziness: {Type of Dizziness:25255}  Frequency: ***  Duration: ***  Aggravating factors: {Aggravating Factors:25258}  Relieving factors: {Relieving Factors:25259}  Progression of symptoms: {DESC; BETTER/WORSE:18575}  OCULOMOTOR EXAM:  Ocular Alignment: {Ocular Alignment:25262}  Ocular ROM: {RANGE OF MOTION:21649}  Spontaneous Nystagmus: {Spontaneous nystagmus:25263}  Gaze-Induced Nystagmus: {gaze-induced nystagmus:25264}  Smooth Pursuits: {smooth pursuit:25265}  Saccades: {saccades:25266}  Convergence/Divergence: *** cm   FRENZEL - FIXATION SUPRESSED:  Ocular Alignment: {Ocular Alignment:25262}  Spontaneous Nystagmus: {Spontaneous nystagmus:25263}  Gaze-Induced Nystagmus: {gaze-induced nystagmus:25264}  Horizontal head shaking - induced nystagmus: {head shaking  induced nystagmus:25267}  Vertical head shaking - induced nystagmus: {head shaking induced nystagmus:25267}  Positional tests: {Positional tests:25271}  Pressure tests: {frenzel pressure tests:25268}  VESTIBULAR - OCULAR REFLEX:   Slow VOR: {slow VOR:25290}  VOR Cancellation: {vor cancellation:25291}  Head-Impulse Test: {head impulse test:25272}  Dynamic Visual Acuity: {dynamic visual acuity:25273}   POSITIONAL TESTING: {Positional tests:25271}  MOTION SENSITIVITY:  Motion Sensitivity Quotient Intensity: 0 = none, 1 = Lightheaded, 2 = Mild, 3 = Moderate, 4 = Severe, 5 = Vomiting  Intensity  1. Sitting to supine   2. Supine to L side   3. Supine to R side   4. Supine to sitting   5. L Hallpike-Dix   6. Up from L    7. R Hallpike-Dix   8. Up from R    9. Sitting, head tipped to L knee   10. Head up from L knee   11. Sitting, head tipped to R knee   12. Head up from R knee   13. Sitting head turns x5   14.Sitting head nods x5   15. In stance, 180 turn to L    16. In stance, 180 turn to R     OTHOSTATICS: {Exam; orthostatics:31331}  FUNCTIONAL GAIT: {Functional tests:24029}  TREATMENT DATE: ***   Canalith Repositioning:  {Canalith Repositioning:25283} Gaze Adaptation:  {gaze adaptation:25286} Habituation:  {habituation:25288} Other: ***  PATIENT EDUCATION: Education details: *** Person educated: {Person educated:25204} Education method: {Education Method:25205} Education comprehension: {Education Comprehension:25206}  HOME EXERCISE PROGRAM:  GOALS: Goals reviewed with patient? {yes/no:20286}  SHORT TERM GOALS: Target date: ***  *** Baseline: Goal status: {GOALSTATUS:25110}  2.  *** Baseline:  Goal status: {GOALSTATUS:25110}  3.  *** Baseline:  Goal status: {GOALSTATUS:25110}  4.  *** Baseline:  Goal status:  {GOALSTATUS:25110}  5.  *** Baseline:  Goal status: {GOALSTATUS:25110}  6.  *** Baseline:  Goal status: {GOALSTATUS:25110}  LONG TERM GOALS: Target date: ***  *** Baseline:  Goal status: {GOALSTATUS:25110}  2.  *** Baseline:  Goal status: {GOALSTATUS:25110}  3.  *** Baseline:  Goal status: {GOALSTATUS:25110}  4.  *** Baseline:  Goal status: {GOALSTATUS:25110}  5.  *** Baseline:  Goal status: {GOALSTATUS:25110}  6.  *** Baseline:  Goal status: {GOALSTATUS:25110}  ASSESSMENT:  CLINICAL IMPRESSION: Patient is a 28 y.o. female who was seen today for physical therapy evaluation and treatment for vertigo.   OBJECTIVE IMPAIRMENTS: {opptimpairments:25111}.   ACTIVITY LIMITATIONS: {activitylimitations:27494}  PARTICIPATION LIMITATIONS: {participationrestrictions:25113}  PERSONAL FACTORS: {Personal factors:25162} are also affecting patient's functional outcome.   REHAB POTENTIAL: {rehabpotential:25112}  CLINICAL DECISION MAKING: {clinical decision making:25114}  EVALUATION COMPLEXITY: {Evaluation complexity:25115}   PLAN:  PT FREQUENCY: {rehab frequency:25116}  PT DURATION: {rehab duration:25117}  PLANNED INTERVENTIONS: {rehab planned interventions:25118::97110-Therapeutic exercises,97530- Therapeutic [phone]- Neuromuscular re-education,97535- Self AOZH,08657- Manual therapy}  PLAN FOR NEXT SESSION: Kelsey Patricia., PT 07/26/2023, 7:58 AM   [facility] at [facility] [address], [address] [place], [place] [address] Phone # [phone] Fax # [phone]

## 2023-08-11 ENCOUNTER — Ambulatory Visit: Payer: Medicaid Other | Admitting: Neurology

## 2023-09-20 NOTE — Progress Notes (Deleted)
 NEUROLOGY CONSULTATION NOTE  Kellie Martinez MRN: [id number] DOB: [birth year]  Referring provider: Haze Servant, NP Primary care provider: Haze Servant, NP  Reason for consult:  occipital headache  Assessment/Plan:   ***   Subjective:  Kellie Martinez is a 28 year old ***-handed female with Bipolar 1 disorder, CKD, asthma, chronic low back pain/postlaminectomy syndrome history of kidney stones who presents for occipital headaches.  History supplemented by referring provider's note.  MRI C-spine personally reviewed.  Onset:  *** Location:  *** Quality:  *** Intensity:  ***.  Aura:  *** Prodrome:  *** Postdrome:  *** Associated symptoms:  ***.  *** denies associated unilateral numbness or weakness. Duration:  *** Frequency:  *** Frequency of abortive medication: *** Triggers:  *** Relieving factors:  *** Activity:  ***  History of neck pain: 05/23/2020 MRI C-SPINE WO:  C5-C6 arthroplasty with nondiagnostic evaluation at this level due to metallic artifact and motion. Evaluation at the other levels is significantly limited by motion without visible compressive stenosis. Query mild adjacent level degenerative change at C4-C5 with possible mild canal and mild left foraminal stenosis. If clinically indicated, a CT myelogram or repeat MRI (possibly with sedation) may better evaluate.  06/24/2020 CT CERVICAL SPINE MYELOGRAM: 1. C5-6 disc arthroplasty without significant residual stenosis. 2. Mild spinal stenosis at C4-5. 03/25/2022 CT HEAD:  No acute intracranial abnormality   Past NSAIDS/analgesics:  Fioricet Past abortive triptans:  sumatriptan  tab Past abortive ergotamine:  none Past muscle relaxants:  cyclobenzaprine , methocarbamol  Past anti-emetic:  ondansetron  Past antihypertensive medications:  metoprolol  Past antidepressant medications:  citalopram, escitalopram , fluoxetine, mirtazapine  Past anticonvulsant medications:  gabapentin  Past anti-CGRP:  *** Past  vitamins/Herbal/Supplements:  *** Past antihistamines/decongestants:  Zyrtec  Other past therapies:  ***  Current NSAIDS/analgesics:  *** Current triptans:  none Current ergotamine:  none Current anti-emetic:  none Current muscle relaxants:  none Current Antihypertensive medications:  none Current Antidepressant medications:  doxepin 25mg  at bedtime, bupropion XL 150mg  daily, trazodone  200mg  at bedtime Current Anticonvulsant medications:  lamotrigine  200mg  twice daily, oxcarbazepine 300mg  twice daily Current anti-CGRP:  none Current Vitamins/Herbal/Supplements:  *** Current Antihistamines/Decongestants:  *** Other therapy:  *** Birth control:  *** Other medications:  hydroxyzine    Caffeine :  *** Alcohol:  *** Smoker:  *** Diet:  *** Exercise:  *** Depression:  ***; Anxiety:  *** Other pain:  *** Sleep hygiene:  ***  History of TBI/concussion:  *** Family history of headache:  *** Family history of cerebral aneurysm:  ***      PAST MEDICAL HISTORY: 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   Chronic kidney disease    Depression    GERD (gastroesophageal reflux disease)    Headache    otc med prn   History of kidney stones    HLD (hyperlipidemia)    diet controlled   Molar pregnancy    Pneumonia    x 1 yrs ago per patient 11/22/19   Renal calculus or stone    had stent/ lithotripsy in past   Sleep apnea    does not use cpap   Substance abuse (HCC)    Suicide attempt (HCC)    x 2    PAST SURGICAL HISTORY: Past Surgical History:  Procedure Laterality Date   CERVICAL DISC ARTHROPLASTY N/A 11/26/2019   Procedure: CERVICAL DISC  ARTHROPLASTY C5-6 WITH PRO DISC;  Surgeon: Lucilla Lynwood BRAVO, MD;  Location: [facility] OR;  Service: Orthopedics;  Laterality: N/A;   CYSTOSCOPY     x2 with stone extraction   EXTRACORPOREAL SHOCK WAVE LITHOTRIPSY Right 06/20/2013    Procedure: EXTRACORPOREAL SHOCK WAVE LITHOTRIPSY (ESWL);  Surgeon: Fairy Brazier, MD;  Location: [facility];  Service: Urology;  Laterality: Right;   FOOT SURGERY Left 2003   LIPOMA EXCISION Right 11/19/2020   Procedure: EXCISION LIPOMA;  Surgeon: Kallie Manuelita BROCKS, MD;  Location: [facility];  Service: General;  Laterality: Right;   LITHOTRIPSY     TUBAL LIGATION  2017   TUBAL LIGATION     UMBILICAL HERNIA REPAIR N/A 03/28/2020   Procedure: HERNIA REPAIR UMBILICAL ADULT;  Surgeon: Kallie Manuelita BROCKS, MD;  Location: [facility];  Service: General;  Laterality: N/A;    MEDICATIONS: Current Outpatient Medications on File Prior to Visit  Medication Sig Dispense Refill   buPROPion (WELLBUTRIN XL) 150 MG 24 hr tablet Take 150 mg by mouth every morning.     doxepin (SINEQUAN) 25 MG capsule Take 25 mg by mouth at bedtime.     hydrOXYzine  (ATARAX ) 10 MG tablet Take 1 tablet (10 mg total) by mouth 3 (three) times daily as needed. 30 tablet 0   lamoTRIgine  (LAMICTAL ) 200 MG tablet Take 200 mg by mouth 2 (two) times daily.     metoprolol  tartrate (LOPRESSOR ) 100 MG tablet Take 1 tablet (100 mg total) by mouth once for 1 dose. Take 90-120 minutes prior to scan. Hold for SBP less than 110. 1 tablet 0   Oxcarbazepine (TRILEPTAL) 300 MG tablet Take 300 mg by mouth 2 (two) times daily.     traZODone  (DESYREL ) 100 MG tablet TAKE 2 TABLETS BY MOUTH AT BEDTIME 90 tablet 1   Current Facility-Administered Medications on File Prior to Visit  Medication Dose Route Frequency Provider Last Rate Last Admin   bupivacaine  liposome (EXPAREL ) 1.3 % injection    PRN Kallie Manuelita BROCKS, MD   20 mL at 03/28/20 1233    ALLERGIES: Allergies  Allergen Reactions   5-Alpha Reductase Inhibitors     Per pt outbursts   Chloramphenicols    Levofloxacin     Unknown reaction   Penicillins Hives    No anaphylaxis  Did it involve swelling of the face/tongue/throat, SOB, or low BP? No Did it involve sudden or severe rash/hives, skin  peeling, or any reaction on the inside of your mouth or nose? Yes Did you need to seek medical attention at a hospital or doctor's office? Yes When did it last happen?      Childhood If all above answers are "NO", may proceed with cephalosporin use.    Phenergan [Promethazine Hcl]     Tremors, restless leg, akathesia 01-26-2018   Seroquel  [Quetiapine ]     Leg tremors     FAMILY HISTORY: Family History  Problem Relation Age of Onset   Depression Mother    Asthma Mother    Hypertension Mother    Mood Disorder Mother    Heart disease Father    Kidney disease Father    Alcoholism Father    Breast cancer Paternal Aunt    Breast cancer Maternal Grandmother    Breast cancer Cousin    Alcoholism Brother    Drug abuse Brother     Objective:  *** General: No acute distress.  Patient appears well-groomed.   Head:  Normocephalic/atraumatic Eyes:  fundi examined but not visualized Neck: supple,  no paraspinal tenderness, full range of motion Heart: regular rate and rhythm Neurological Exam: Mental status: alert and oriented to person, place, and time, speech fluent and not dysarthric, language intact. Cranial nerves: CN I: not tested CN II: pupils equal, round and reactive to light, visual fields intact CN III, IV, VI:  full range of motion, no nystagmus, no ptosis CN V: facial sensation intact. CN VII: upper and lower face symmetric CN VIII: hearing intact CN IX, X: gag intact, uvula midline CN XI: sternocleidomastoid and trapezius muscles intact CN XII: tongue midline Bulk & Tone: normal, no fasciculations. Motor:  muscle strength 5/5 throughout Sensation:  Pinprick and vibratory sensation intact. Deep Tendon Reflexes:  2+ throughout,  toes downgoing.   Finger to nose testing:  Without dysmetria.   Gait:  Normal station and stride.  Romberg negative.    Thank you for allowing me to take part in the care of this patient.  Juliene Dunnings, DO  CC: ***

## 2023-09-21 ENCOUNTER — Ambulatory Visit: Admitting: Neurology

## 2023-09-21 ENCOUNTER — Encounter: Payer: Self-pay | Admitting: Neurology

## 2023-10-03 ENCOUNTER — Encounter: Payer: Self-pay | Admitting: Nurse Practitioner

## 2023-11-11 ENCOUNTER — Other Ambulatory Visit: Payer: Self-pay | Admitting: Nurse Practitioner

## 2023-11-11 DIAGNOSIS — F32A Depression, unspecified: Secondary | ICD-10-CM

## 2023-12-05 DIAGNOSIS — R112 Nausea with vomiting, unspecified: Secondary | ICD-10-CM | POA: Diagnosis not present

## 2023-12-14 DIAGNOSIS — F410 Panic disorder [episodic paroxysmal anxiety] without agoraphobia: Secondary | ICD-10-CM | POA: Diagnosis not present

## 2023-12-14 DIAGNOSIS — F900 Attention-deficit hyperactivity disorder, predominantly inattentive type: Secondary | ICD-10-CM | POA: Diagnosis not present

## 2023-12-14 DIAGNOSIS — Z5181 Encounter for therapeutic drug level monitoring: Secondary | ICD-10-CM | POA: Diagnosis not present

## 2023-12-14 DIAGNOSIS — F411 Generalized anxiety disorder: Secondary | ICD-10-CM | POA: Diagnosis not present

## 2023-12-14 DIAGNOSIS — F3132 Bipolar disorder, current episode depressed, moderate: Secondary | ICD-10-CM | POA: Diagnosis not present

## 2023-12-29 ENCOUNTER — Encounter: Payer: Self-pay | Admitting: Nurse Practitioner

## 2023-12-30 NOTE — Telephone Encounter (Signed)
 Copied from CRM [id number]. Topic: Clinical - Lab/Test Results >> Dec 30, 2023 12:30 PM Avram MATSU wrote:  Reason for CRM: labs for STD testing

## 2023-12-30 NOTE — Telephone Encounter (Signed)
 Noted, Appointment scheduled.

## 2024-01-02 ENCOUNTER — Ambulatory Visit: Admitting: Internal Medicine

## 2024-01-09 ENCOUNTER — Ambulatory Visit: Admitting: Nurse Practitioner

## 2024-02-10 ENCOUNTER — Telehealth: Payer: Self-pay | Admitting: Nurse Practitioner

## 2024-02-10 NOTE — Telephone Encounter (Signed)
 Contacted pt to confirmed appt .Kellie Martinez Pt phone couldn't be completed as dial no vm

## 2024-02-13 ENCOUNTER — Ambulatory Visit: Admitting: Nurse Practitioner
